# Patient Record
Sex: Male | Born: 1940 | Race: White | Hispanic: No | Smoking: Former smoker
Health system: Southern US, Community
[De-identification: ages and names within clinical notes are randomized; demographics above are authoritative.]

## PROBLEM LIST (undated history)

## (undated) DIAGNOSIS — G834 Cauda equina syndrome: Secondary | ICD-10-CM

## (undated) DIAGNOSIS — C4499 Other specified malignant neoplasm of skin, unspecified: Secondary | ICD-10-CM

## (undated) DIAGNOSIS — I10 Essential (primary) hypertension: Secondary | ICD-10-CM

## (undated) DIAGNOSIS — K219 Gastro-esophageal reflux disease without esophagitis: Secondary | ICD-10-CM

## (undated) DIAGNOSIS — E11319 Type 2 diabetes mellitus with unspecified diabetic retinopathy without macular edema: Secondary | ICD-10-CM

## (undated) DIAGNOSIS — C4490 Unspecified malignant neoplasm of skin, unspecified: Secondary | ICD-10-CM

## (undated) HISTORY — DX: Type 2 diabetes mellitus with unspecified diabetic retinopathy without macular edema: E11.319

## (undated) HISTORY — DX: Other specified malignant neoplasm of skin, unspecified: C44.99

## (undated) HISTORY — DX: Essential (primary) hypertension: I10

## (undated) HISTORY — DX: Cauda equina syndrome: G83.4

## (undated) HISTORY — DX: Gastro-esophageal reflux disease without esophagitis: K21.9

## (undated) HISTORY — DX: Unspecified malignant neoplasm of skin, unspecified: C44.90

## (undated) HISTORY — PX: KNEE SURGERY: SHX244

---

## 1999-02-10 ENCOUNTER — Encounter: Admission: RE | Admit: 1999-02-10 | Discharge: 1999-05-11 | Payer: Self-pay | Admitting: Internal Medicine

## 1999-07-27 ENCOUNTER — Encounter: Admission: RE | Admit: 1999-07-27 | Discharge: 1999-10-25 | Payer: Self-pay | Admitting: Orthopedic Surgery

## 2000-09-20 ENCOUNTER — Encounter: Admission: RE | Admit: 2000-09-20 | Discharge: 2000-09-26 | Payer: Self-pay | Admitting: Orthopedic Surgery

## 2000-12-20 ENCOUNTER — Encounter: Admission: RE | Admit: 2000-12-20 | Discharge: 2000-12-26 | Payer: Self-pay | Admitting: Orthopedic Surgery

## 2001-03-28 ENCOUNTER — Encounter: Admission: RE | Admit: 2001-03-28 | Discharge: 2001-06-20 | Payer: Self-pay | Admitting: Orthopedic Surgery

## 2001-12-25 ENCOUNTER — Encounter (HOSPITAL_BASED_OUTPATIENT_CLINIC_OR_DEPARTMENT_OTHER): Admission: RE | Admit: 2001-12-25 | Discharge: 2002-03-25 | Payer: Self-pay | Admitting: Orthopedic Surgery

## 2003-06-22 HISTORY — PX: CORONARY ARTERY BYPASS GRAFT: SHX141

## 2003-06-22 HISTORY — PX: CARDIAC CATHETERIZATION: SHX172

## 2003-09-05 ENCOUNTER — Encounter (HOSPITAL_BASED_OUTPATIENT_CLINIC_OR_DEPARTMENT_OTHER): Admission: RE | Admit: 2003-09-05 | Discharge: 2003-09-12 | Payer: Self-pay | Admitting: Internal Medicine

## 2004-03-27 ENCOUNTER — Inpatient Hospital Stay (HOSPITAL_COMMUNITY): Admission: RE | Admit: 2004-03-27 | Discharge: 2004-04-02 | Payer: Self-pay | Admitting: Surgery

## 2004-04-21 ENCOUNTER — Encounter: Admission: RE | Admit: 2004-04-21 | Discharge: 2004-04-21 | Payer: Self-pay | Admitting: Surgery

## 2004-07-31 ENCOUNTER — Encounter (HOSPITAL_BASED_OUTPATIENT_CLINIC_OR_DEPARTMENT_OTHER): Admission: RE | Admit: 2004-07-31 | Discharge: 2004-09-18 | Payer: Self-pay | Admitting: Internal Medicine

## 2005-03-10 ENCOUNTER — Encounter: Admission: RE | Admit: 2005-03-10 | Discharge: 2005-03-10 | Payer: Self-pay | Admitting: Family Medicine

## 2005-11-10 ENCOUNTER — Emergency Department: Payer: Self-pay | Admitting: Emergency Medicine

## 2005-11-12 ENCOUNTER — Ambulatory Visit: Payer: Self-pay | Admitting: Unknown Physician Specialty

## 2005-11-12 ENCOUNTER — Other Ambulatory Visit: Payer: Self-pay

## 2005-11-17 ENCOUNTER — Inpatient Hospital Stay: Payer: Self-pay | Admitting: Unknown Physician Specialty

## 2006-02-24 ENCOUNTER — Ambulatory Visit: Payer: Self-pay | Admitting: Family Medicine

## 2007-02-22 ENCOUNTER — Ambulatory Visit: Payer: Self-pay | Admitting: Family Medicine

## 2007-06-08 ENCOUNTER — Ambulatory Visit: Payer: Self-pay | Admitting: Family Medicine

## 2009-08-14 ENCOUNTER — Ambulatory Visit: Payer: Self-pay | Admitting: Family Medicine

## 2009-11-19 ENCOUNTER — Ambulatory Visit: Payer: Self-pay | Admitting: Family Medicine

## 2010-11-06 NOTE — Op Note (Signed)
[name], [name]                ACCOUNT NO.:  [id number]   MEDICAL RECORD NO.:  [id number]          PATIENT TYPE:  INP   LOCATION:  2303                         FACILITY:  [facility]   PHYSICIAN:  Gilford Raid, M.D.     DATE OF BIRTH:  1941/06/10   DATE OF PROCEDURE:  03/27/2004  DATE OF DISCHARGE:                                 OPERATIVE REPORT   PREOPERATIVE DIAGNOSIS:  Severe 3-vessel coronary artery disease with  unstable angina.   POSTOPERATIVE DIAGNOSIS:  Severe 3-vessel coronary artery disease with  unstable angina.   OPERATION:  Median sternotomy, extracorporeal circulation, coronary artery  bypass graft surgery x7 using a sequential left internal mammary artery  graft to the second diagonal branch of the left anterior descending artery  and the distal left anterior descending artery itself, a saphenous vein  graft to the first diagonal branch of the left anterior descending artery,  sequential  saphenous vein graft to intermediate, posterolateral 1 and posterolateral 2  branches of the left circumflex coronary artery.  Saphenous vein graft to  the posterior descending coronary artery.  Endoscopic vein harvesting from  the left leg.   SURGEON:  Gilford Raid, M.D.   ASSISTANT:  Ivin Poot, M.D.   SECOND ASSISTANT:  Suzzanne Cloud, P.A.-C.   ANESTHESIA:  General endotracheal.   CLINICAL HISTORY:  This patient is a 70 year old patient of Dr. Krista Blue.  Patient has history of hypertension, hyperlipidemia, and diabetes.  He has a  several months history of fatigue and chest heaviness with exertion and  recently developed unstable anginal symptoms which had been occurring at  rest. A recent Cardiolite scan showed inferior apical ischemia with a very  small defect.  He was put on medical therapy but continued to have chest  pain.   Cardiac catheterization showed a heavily calcified left main and proximal  LAD and left circumflex. There was 90% proximal LAD stenosis.  There was a  moderate sized first diagonal and 90% ostial stenosis.  The LAD also had 70-  80% stenosis in the mid-portion beyond the second diagonal branch.  The  second diagonal itself was a medium sized vessel. The left circumflex was a  dominant vessel that had several branches which were relatively small.  There was a moderate sized intermediate vessel that had 90% proximal  stenosis. The left circumflex itself had 90% proximal stenosis. There was  also 90% mid-left circumflex stenosis and about 40% distal stenosis,  followed by subtotal occlusion before the terminal posterolateral branch.  The right coronary artery was occluded proximally with filling of the  posterior descending branch by collaterals from the left.  Left ventricular  ejection fraction was 50% with no mitral regurgitation.   After review of the angiogram and examination of the patient, it was felt  that coronary artery bypass graft surgery was the best treatment.  I  discussed the operative procedure with the patient and his wife, including  alternatives, benefits and risks including bleeding, blood transfusion,  infection, stroke, myocardial infarction, graft failure and death.  They  understood and agreed to proceed.  DESCRIPTION OF PROCEDURE:  The patient was taken to the operating room,  placed on the table in the supine position.  After induction of general  endotracheal anesthesia, a  Foley catheter was placed in the bladder using  sterile technique. Then the chest, abdomen and both lower extremities were  prepped and draped in the usual sterile manner.   The chest was entered through a median sternotomy incision and the  pericardium opened in the midline.  Examination of the heart showed good  ventricular contractility.  The ascending aorta had no palpable plaque in  it.   Then the left internal mammary artery was harvested from the chest wall as a  pedicle graft. This was a medium caliber vessel with  excellent blood flow  through it. At the same time, a segment of greater saphenous vein was  harvested from the left leg using endoscopic vein harvest technique. This  vein was of medium size and good quality.   I initially examined the right greater saphenous vein at the knee, but this  was a small vein and therefore was not harvested.   The patient was then heparinized and after an adequate clotting time was  achieved, the distal ascending aorta was cannulated using a 20 French aortic  cannula for arterial inflow.  Venous outflow was achieved using a 2-stage  venous cannula through the right atrial appendage.  An antegrade  cardioplegia and vent cannula was inserted in the aortic root.   The patient was placed on cardiopulmonary bypass and the distal coronary was  identified.  The LAD was a medium sized graftable vessel.  It was diffusely diseased. The first diagonal was small but graftable. The  second diagonal was a moderate sized vessel.  The intermediate artery was  intra-myocardial that was located in its mid-portion and it was a  moderate sized, graftable vessel.  There were several other obtuse marginal  or posterolateral branches, which were small vessels.  The 2 posterolateral  branches at the end of the left circumflex were small but felt to be  graftable.  The posterior descending was a moderate sized, graftable vessel.   The aorta was then cross-clamped and 500 cc of cold blood antegrade  cardioplegia was administered in the aortic root with quick arrest of the  heart.  Systemic hypothermia to 20 degrees Centigrade and topical  hypothermia with iced saline was used.  A temperature probe was placed in  the septum, insulating pad in the pericardium.   The first distal anastomosis was performed to the intermediate coronary  artery. The internal diameter was about 1.6 mm. The conduit used was a segment of greater saphenous vein.  The anastomosis was performed in an end-   to-side manner using 7-0 Prolene suture.  Flow was noted through the graft  and was excellent.   The second distal anastomosis was performed to the first posterolateral  branch. The internal diameter of this vessel was about 1.4 mm.  Conduit used  was the same segment of greater saphenous vein. The anastomosis was  performed in a sequential, side-to-side manner using continuous 8-0 Prolene  suture.  Flow was noted through the graft and was excellent.   The third distal anastomosis was performed to the second posterolateral  branch. The internal diameter of this vessel was about 1.4 mm.  Conduit used  was the same segment of greater saphenous vein. The anastomosis was  performed in a sequential, end-to-side manner using continuous 8-0 Prolene  suture.  Flow  was again measured through this graft and was excellent.  Then  another dose of cardioplegia was given down the vein graft, and in the  aortic root.   The fourth distal anastomosis was performed to the first diagonal branch.  The internal diameter was 1.5 mm. Conduit used was a second segment of  greater saphenous vein.  The anastomosis was performed in an  end-to-side manner using continuous 7-0 Prolene suture.  Flow was noted  through this graft and was excellent.   The fifth distal anastomosis was performed to the posterior descending  coronary artery.  The internal diameter of this vessel was 1.6 mm. Conduit  used was a third segment of greater saphenous vein.  The anastomosis was  performed in an end-to-side manner using continuous 7-0 Prolene suture.  Flow was noted through this graft and was excellent.   Then another dose of cardioplegia was given down the vein grafts and in the  aortic root.   The third distal anastomosis was performed to the second posterolateral  branch. The internal diameter of this vessel was about 1.4 mm.  Conduit used  was the same segment of greater saphenous vein. The anastomosis was  performed in  a sequential, end-to-side manner using continuous 8-0 Prolene  suture.  Flow was again measured through this graft and was excellent.  Then  another dose of cardioplegia given down the vein graft, and in the aortic  root.   The fourth distal anastomosis was performed to the first diagonal branch.  The internal diameter was 1.5 mm. Conduit used was a second segment of  greater saphenous vein.  The anastomosis was performed in an  end-to-side manner using continuous 7-0 Prolene suture.  Flow was noted  through this graft and was excellent.   The sixth distal anastomosis was performed to the second diagonal branch.  The internal diameter was about 1.6 mm. Conduit used was  the left internal mammary artery graft.  This was brought through an opening  in the left pericardium, anterior to the phrenic nerve. It was anastomosed  to the second diagonal branch in a sequential, side-to-side manner using continuous 8-0 Prolene suture.  Flow was noted through this graft and was  excellent.   The seventh distal anastomosis was performed to the distal LAD.  The  internal diameter was about 1.6 mm. Conduit used was the left internal  mammary graft.  It was anastomosed in a sequential, side-to-side manner  using continuous 8-0 Prolene suture.  Flow was again measured through this  graft and was excellent.   The mammary pedicle was tacked to the epicardium with 6-0 Prolene sutures at  both anastomoses.  The patient was then rewarmed to 37 degrees Centigrade  and the clamp removed from the mammary pedicle.  There was rapid warming of  the ventricular septum and return of  spontaneous ventricular fibrillation.  The cross-clamp was removed at a time of 106 minutes.  The patient  spontaneously converted to sinus rhythm.   A partial occlusion clamp was placed in the aortic root and the 3 proximal  vein graft anastomoses were performed in an end-to-side manner using 0 and 6-  0 Prolene suture.  The clamp  was removed, the vein grafts deaired, and  clamped.  The proximal and distal anastomoses appeared hemostatic and lie of  the grafts satisfactory. Graft markers were placed around the proximal  anastomoses.   Two temporary right ventricular and right atrial pacing wires were placed  and brought  out through the skin.   When the patient had rewarmed to 37 degrees Centigrade, he was weaned from  cardiopulmonary bypass on no inotropic agents.  Total bypass time was 148  minutes.  Cardiac function appeared excellent with cardiac output of 5.6  L/minute. Protamine was given and the venous and aortic cannulas were  removed without difficulty.  Hemostasis was achieved. Three chest tubes were  placed; 1 tube in the posterior pericardium and 1 in the left pleural space  and 1 in the anterior mediastinum. The pericardium was loosely  reapproximated over the heart.  The sternum was closed with #6 stainless  steel wires.  The fascia was closed with continuous #1 Vicryl suture,  subcutaneous tissue was closed with continuous 2-0 Vicryl and skin with 3-0  Vicryl subcuticular closure.  The lower extremity vein harvest site was then  closed in layers in a similar manner.   Then the 2 On-Q pain control catheters were inserted. Stab incisions were  made just lateral to the chest tube sites, and the 12 cm introducer and  trocar was inserted and advanced anterior to the chest wall on both sides of  the sternum.  Through the introducer, the infusion catheter was inserted,  and the introducer was removed on each side. The catheters were fixed to the  chest with a silk suture.  These catheters were then flushed with 5 cc of  0.5% Marcaine local anesthesia and then were connected to the infusion bulb,  and infusion was begun.  The patient tolerated the procedure well, was then  transported to the SICU in guarded but stable condition.  The sponge, instrument and needle counts were correct according to the scrub  nurse.      Brya   BB/MEDQ  D:  03/27/2004  T:  03/27/2004  Job:  [id number]   cc:   Eden Lathe. Einar Gip, [place], [address] [address]  Fax: [phone]   CVTS office   Cardiac cath lab, [facility]

## 2010-11-06 NOTE — Discharge Summary (Signed)
[name], [name]                ACCOUNT NO.:  [id number]   MEDICAL RECORD NO.:  [id number]          PATIENT TYPE:  INP   LOCATION:  2014                         FACILITY:  [facility]   PHYSICIAN:  [name], M.D.     DATE OF BIRTH:  25-Feb-1941   DATE OF ADMISSION:  03/27/2004  DATE OF DISCHARGE:  04/02/2004                                 DISCHARGE SUMMARY   HISTORY OF PRESENT ILLNESS:  This is a 70 year old patient referred to Dr.  Cyndia Collins by Dr. Einar Collins.  He has a known history of hypertension,  hyperlipidemia, and diabetes.  He has had several months of fatigue and  chest heaviness with exertion and recently developed unstable angina  symptoms which were occurring at rest.  Cardiolite study revealed inferior  apical ischemia with a very small defect.  He was placed on medical therapy,  but continued to have symptoms of chest pain.  He subsequently was taken to  the cardiac catheterization lab where he was found to have heavily calcified  left main and proximal LAD and left circumflex disease.  There was 90%  proximal LAD stenosis.  There was a moderate size first diagonal and 90%  ostial stenosis.  The LAD also had a 70-80% stenosis in the mid portion  beyond the second diagonal branch.  The second diagonal itself was a medium  size vessel.  The left circumflex was a dominant vessel that had several  branches which were relatively small.  There was a moderate size  intermediate vessel that had 90%  proximal stenosis.  The left circumflex  itself had a 90% proximal stenosis.  There was also a 90% mid left  circumflex stenosis and about a 40% distal stenosis, followed by a subtotal  occlusion before the terminal posterior lateral branch.  The right coronary  artery was occluded proximally with filling of the posterior descending  branch by collaterals from the left.  Left ventricular ejection fraction was  50% with no mitral regurgitation.  He was felt to be a candidate for  surgical  revascularization and was admitted this hospitalization for the  procedure.   ALLERGIES:  ALTACE causes a cough, but no known drug allergies.   MEDICATIONS PRIOR TO ADMISSION:  1.  Glucophage 500 mg b.i.d.  2.  Glyburide 5 mg b.i.d.  3.  Zocor, dose unknown.  4.  Aspirin p.r.n.  5.  Diovan 80 mg q.h.s.  6.  Toprol 25 mg daily.  7.  Plavix 75 mg daily.  8.  Sublingual nitroglycerin p.r.n.   PAST MEDICAL HISTORY:  1.  Unstable angina pectoris with severe coronary artery disease as      described.  2.  Diabetes mellitus.  3.  Hypertension.  4.  Dyslipidemia.   FAMILY HISTORY, SOCIAL HISTORY, REVIEW OF SYMPTOMS, PHYSICAL EXAM:  Please  see the history and physical done at the time of admission.   HOSPITAL COURSE:  The patient was admitted electively and taken to the  operating room on March 27, 2004 where he underwent the following  procedure.  Coronary bypass grafting x7 using a sequential  left internal  mammary artery to the second diagonal branch and the left anterior  descending and the distal left anterior descending artery itself, a  saphenous vein graft to the first diagonal branch of the left anterior  descending artery, sequential saphenous vein graft to the intermediate,  posterior lateral 1 and posterior lateral 2 branches of the left circumflex  coronary artery.  Saphenous vein graft to the posterior descending coronary  artery.  Intraoperative findings including small distal vessels and diffuse  disease.  He did tolerate the procedure quite well and was taken to surgical  intensive care unit in stable condition.   POSTOPERATIVE HOSPITAL COURSE:  The patient has done well.  He was extubated  from the ventilator without difficulty.  All routine lines, monitors, and  drainage devices were discontinued in a standard fashion.  The patient did  require an aggressive diuresis.  He is responding well to Lasix and will  continue as an outpatient for a short time.  His  diabetes was somewhat  difficult to control early, but has improved.  His oral regimen is now  handling his glycemic control, but he may require further treatment as an  outpatient.  His incisions are all healing well without evidence of  infection.  He is maintaining normal sinus rhythm.  Oxygen has been weaned  and he maintains good saturations on room air.  Laboratory values are within  normal limits with the exception of a mild anemia.  His most recent  hemoglobin and hematocrit dated March 31, 2004 are 10.1 and 28  respectively.  He did have some difficulty with nausea, but this was mostly  felt to be related to his pain medication and has improved.  He is  tolerating diet adequately.  He is tolerating routine activities  commensurate for level of postoperative convalescence using cardiac  rehabilitation phase one modalities.  Overall, he is felt to be stable on  today's date of April 02, 2004.   DISCHARGE MEDICATIONS:  1.  Aspirin 325 mg daily.  2.  Toprol XL 25 mg daily.  3.  Avapro 75 mg daily.  4.  Zocor 40 mg daily.  5.  Glyburide 5 mg twice daily.  6.  Plavix 75 mg daily.  7.  Glucophage 1000 mg two times daily.  8.  He is to resume his home Prevacid.  9.  Lasix 40 mg daily for five days.  10. K-Dur 20 mEq daily for five days.  11. For pain, Tylox one or two every four to six hours as needed for pain.   INSTRUCTIONS:  The patient received written instructions in regard to  medications, activity, diet, wound care and followup.  Followup will include  Dr. Einar Collins two weeks, Dr. Cyndia Collins in three weeks on April 21, 2004 with  chest x-ray from [facility].   FINAL DIAGNOSIS:  Severe multivessel coronary artery disease as described.   OTHER DIAGNOSES:  1.  Hypertension.  2.  Hypercholesterolemia.  3.  Diabetes mellitus type 2.  4.  Dyslipidemia.  5.  Postoperative anemia.  ADDENDUM:  The patient is instructed to follow up with Dr. Redmond Collins in   regards to his diabetes who he sees from a medical view point.      [name]   WEG/MEDQ  D:  04/02/2004  T:  04/02/2004  Job:  [id number]   cc:   [name]. [name], [place] [address], [address] [place]  [place] [address]  Fax: [phone]

## 2012-02-17 ENCOUNTER — Telehealth: Payer: Self-pay | Admitting: Family Medicine

## 2012-02-17 NOTE — Telephone Encounter (Signed)
LM

## 2012-06-21 HISTORY — PX: CATARACT EXTRACTION: SUR2

## 2012-07-11 ENCOUNTER — Ambulatory Visit: Payer: Self-pay | Admitting: Oncology

## 2012-07-22 ENCOUNTER — Ambulatory Visit: Payer: Self-pay | Admitting: Oncology

## 2012-07-22 HISTORY — PX: BACK SURGERY: SHX140

## 2012-10-19 ENCOUNTER — Ambulatory Visit: Payer: Self-pay | Admitting: Oncology

## 2012-11-06 LAB — BASIC METABOLIC PANEL
Anion Gap: 11 (ref 7–16)
BUN: 29 mg/dL — ABNORMAL HIGH (ref 7–18)
Calcium, Total: 9 mg/dL (ref 8.5–10.1)
Chloride: 100 mmol/L (ref 98–107)
Co2: 26 mmol/L (ref 21–32)
Creatinine: 1.59 mg/dL — ABNORMAL HIGH (ref 0.60–1.30)
EGFR (African American): 49 — ABNORMAL LOW
EGFR (Non-African Amer.): 42 — ABNORMAL LOW
Glucose: 170 mg/dL — ABNORMAL HIGH (ref 65–99)
Osmolality: 284 (ref 275–301)
Potassium: 4.9 mmol/L (ref 3.5–5.1)
Sodium: 137 mmol/L (ref 136–145)

## 2012-11-06 LAB — CBC CANCER CENTER
Basophil #: 0.1 x10 3/mm (ref 0.0–0.1)
Basophil %: 0.9 %
Eosinophil #: 0.3 x10 3/mm (ref 0.0–0.7)
Eosinophil %: 4 %
HCT: 37.3 % — ABNORMAL LOW (ref 40.0–52.0)
HGB: 12.9 g/dL — ABNORMAL LOW (ref 13.0–18.0)
Lymphocyte #: 2 x10 3/mm (ref 1.0–3.6)
Lymphocyte %: 27.7 %
MCH: 28.5 pg (ref 26.0–34.0)
MCHC: 34.7 g/dL (ref 32.0–36.0)
MCV: 82 fL (ref 80–100)
Monocyte #: 0.6 x10 3/mm (ref 0.2–1.0)
Monocyte %: 8.2 %
Neutrophil #: 4.4 x10 3/mm (ref 1.4–6.5)
Neutrophil %: 59.2 %
Platelet: 191 x10 3/mm (ref 150–440)
RBC: 4.54 10*6/uL (ref 4.40–5.90)
RDW: 13.6 % (ref 11.5–14.5)
WBC: 7.4 x10 3/mm (ref 3.8–10.6)

## 2012-11-07 LAB — PROT IMMUNOELECTROPHORES(ARMC)

## 2012-11-07 LAB — KAPPA/LAMBDA FREE LIGHT CHAINS (ARMC)

## 2012-11-07 LAB — FREE K+L LT CHAIN, QT, UR (24 HOUR)

## 2012-11-08 LAB — URINE IEP, RANDOM

## 2012-11-19 ENCOUNTER — Ambulatory Visit: Payer: Self-pay | Admitting: Oncology

## 2013-03-22 ENCOUNTER — Ambulatory Visit: Payer: Self-pay | Admitting: Ophthalmology

## 2013-04-03 ENCOUNTER — Ambulatory Visit: Payer: Self-pay | Admitting: Ophthalmology

## 2013-04-06 ENCOUNTER — Ambulatory Visit (INDEPENDENT_AMBULATORY_CARE_PROVIDER_SITE_OTHER): Payer: Medicare Other | Admitting: Cardiovascular Disease

## 2013-04-06 ENCOUNTER — Encounter: Payer: Self-pay | Admitting: Cardiovascular Disease

## 2013-04-06 ENCOUNTER — Encounter (INDEPENDENT_AMBULATORY_CARE_PROVIDER_SITE_OTHER): Payer: Self-pay

## 2013-04-06 VITALS — BP 140/62 | HR 49 | Ht 74.5 in | Wt 201.5 lb

## 2013-04-06 DIAGNOSIS — R9431 Abnormal electrocardiogram [ECG] [EKG]: Secondary | ICD-10-CM

## 2013-04-06 DIAGNOSIS — R001 Bradycardia, unspecified: Secondary | ICD-10-CM | POA: Insufficient documentation

## 2013-04-06 DIAGNOSIS — I10 Essential (primary) hypertension: Secondary | ICD-10-CM

## 2013-04-06 DIAGNOSIS — I2510 Atherosclerotic heart disease of native coronary artery without angina pectoris: Secondary | ICD-10-CM

## 2013-04-06 DIAGNOSIS — I498 Other specified cardiac arrhythmias: Secondary | ICD-10-CM

## 2013-04-06 NOTE — Assessment & Plan Note (Signed)
He has no symptoms suggestive of angina with good functional capacity. He can proceed with cataract surgery after cardiac workup. No indication for a stress test. Continue medical therapy.

## 2013-04-06 NOTE — Assessment & Plan Note (Addendum)
The patient has first degree AV block with bradycardia. Overall he appears to be asymptomatic. I recommend checking thyroid function if not already done. I will obtain a 48-hour Holter monitor for evaluation. I suspect that he will require a permanent pacemaker in the future but currently there is no indication. He has mild dyspnea with no recent LV systolic evaluation. I  requested an echocardiogram.

## 2013-04-06 NOTE — Assessment & Plan Note (Signed)
Blood pressure is controlled on current medications. I recommend avoiding AV nodal blocking agents given his bradycardia first degree AV block.

## 2013-04-06 NOTE — Progress Notes (Signed)
Primary care physician: Dr. Luan Pulling  HPI  This is a pleasant 72 year old man who was referred by Dr. Luan Pulling for evaluation of bradycardia and abnormal EKG. He has known history of coronary artery disease status post 7 vessel CABG in 2005 by Dr. [name]. No cardiac events since then. He has known history of hypertension, hyperlipidemia, type 2 diabetes and GERD. He also has mild chronic kidney disease with proteinuria. 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 He needs to have cataract surgery done. Preoperative EKG was read as junctional rhythm. However, P waves with  Clear. The EKG is consistent with sinus rhythm with first degree AV block. Heart rate was 49 beats per minute. The patient denies any chest pain, dizziness, syncope or presyncope. He has mild exertional dyspnea with no recent worsening. He stays active. He plays golf on a regular basis.  Allergies  Allergen Reactions  . Ivp Dye [Iodinated Diagnostic Agents]      No current outpatient prescriptions on file prior to visit.   No current facility-administered medications on file prior to visit.     Past Medical History  Diagnosis Date  . Carcinoma of skin   . Diabetic retinopathy   . Diabetes mellitus without complication   . GERD (gastroesophageal reflux disease)   . Coronary artery disease 2005    CABG X 7 in 2005     Past Surgical History  Procedure Laterality Date  . Cardiac catheterization  2005  . Coronary artery bypass graft  2005     CABG x 7 at [facility]   . Cataract extraction  2014    right eye   . Knee surgery      left knee     Family History  Problem Relation Age of Onset  . Hypertension Sister   . Hyperlipidemia Sister      History   Social History  . Marital Status: Married    Spouse Name: N/A    Number of Children: N/A  . Years of Education: N/A   Occupational History  . Not on file.   Social History Main Topics  . Smoking status: Current Some Day Smoker -- 45 years    Types:  Cigars, Pipe  . Smokeless tobacco: Not on file  . Alcohol Use: Yes     Comment: occasional.  . Drug Use: No  . Sexual Activity: Not on file   Other Topics Concern  . Not on file   Social History Narrative  . No narrative on file     ROS A 10 point review of system was performed. It is negative other than that mentioned in the history of present illness.   PHYSICAL EXAM   BP 140/62  Pulse 49  Ht 6' 2.5" (1.892 m)  Wt 201 lb 8 oz (91.4 kg)  BMI 25.53 kg/m2 Constitutional: He is oriented to person, place, and time. He appears well-developed and well-nourished. No distress.  HENT: No nasal discharge.  Head: Normocephalic and atraumatic.  Eyes: Pupils are equal and round.  No discharge. Neck: Normal range of motion. Neck supple. No JVD present. No thyromegaly present.  Cardiovascular: Bradycardic, regular rhythm, normal heart sounds. Exam reveals no gallop and no friction rub. No murmur heard.  Pulmonary/Chest: Effort normal and breath sounds normal. No stridor. No respiratory distress. He has no wheezes. He has no rales. He exhibits no tenderness.  Abdominal: Soft. Bowel sounds are normal. He exhibits no distension. There is no tenderness. There is no rebound and no guarding.  Musculoskeletal: Normal range of motion. He exhibits no edema and no tenderness.  Neurological: He is alert and oriented to person, place, and time. Coordination normal.  Skin: Skin is warm and dry. No rash noted. He is not diaphoretic. No erythema. No pallor.  Psychiatric: He has a normal mood and affect. His behavior is normal. Judgment and thought content normal.       [id number] sinus  Bradycardia  -First degree A-V block  PRi = 288 BORDERLINE RHYTHM   ASSESSMENT AND PLAN

## 2013-04-06 NOTE — Patient Instructions (Signed)
Your physician has requested that you have an echocardiogram. Echocardiography is a painless test that uses sound waves to create images of your heart. It provides your doctor with information about the size and shape of your heart and how well your heart's chambers and valves are working. This procedure takes approximately one hour. There are no restrictions for this procedure.  Your physician has recommended that you wear a holter monitor. Holter monitors are medical devices that record the heart's electrical activity. Doctors most often use these monitors to diagnose arrhythmias. Arrhythmias are problems with the speed or rhythm of the heartbeat. The monitor is a small, portable device. You can wear one while you do your normal daily activities. This is usually used to diagnose what is causing palpitations/syncope (passing out).  Your physician wants you to follow-up in: 6 months.  You will receive a reminder letter in the mail two months in advance. If you don't receive a letter, please call our office to schedule the follow-up appointment.

## 2013-04-17 ENCOUNTER — Other Ambulatory Visit: Payer: Medicare Other

## 2013-08-15 ENCOUNTER — Emergency Department: Payer: Self-pay | Admitting: Emergency Medicine

## 2013-08-15 LAB — COMPREHENSIVE METABOLIC PANEL
Albumin: 3.6 g/dL (ref 3.4–5.0)
Alkaline Phosphatase: 57 U/L
Anion Gap: 5 — ABNORMAL LOW (ref 7–16)
BUN: 34 mg/dL — ABNORMAL HIGH (ref 7–18)
Bilirubin,Total: 0.3 mg/dL (ref 0.2–1.0)
Calcium, Total: 8.7 mg/dL (ref 8.5–10.1)
Chloride: 100 mmol/L (ref 98–107)
Co2: 25 mmol/L (ref 21–32)
Creatinine: 1.7 mg/dL — ABNORMAL HIGH (ref 0.60–1.30)
EGFR (African American): 45 — ABNORMAL LOW
EGFR (Non-African Amer.): 39 — ABNORMAL LOW
Glucose: 215 mg/dL — ABNORMAL HIGH (ref 65–99)
Osmolality: 275 (ref 275–301)
Potassium: 4.7 mmol/L (ref 3.5–5.1)
SGOT(AST): 23 U/L (ref 15–37)
SGPT (ALT): 17 U/L (ref 12–78)
Sodium: 130 mmol/L — ABNORMAL LOW (ref 136–145)
Total Protein: 7.1 g/dL (ref 6.4–8.2)

## 2013-08-15 LAB — CBC
HCT: 37.6 % — ABNORMAL LOW (ref 40.0–52.0)
HGB: 13 g/dL (ref 13.0–18.0)
MCH: 29.3 pg (ref 26.0–34.0)
MCHC: 34.6 g/dL (ref 32.0–36.0)
MCV: 85 fL (ref 80–100)
Platelet: 171 10*3/uL (ref 150–440)
RBC: 4.44 10*6/uL (ref 4.40–5.90)
RDW: 13.2 % (ref 11.5–14.5)
WBC: 6.7 10*3/uL (ref 3.8–10.6)

## 2013-08-15 LAB — TROPONIN I: Troponin-I: <0.02 ng/mL

## 2013-08-23 DIAGNOSIS — G834 Cauda equina syndrome: Secondary | ICD-10-CM | POA: Insufficient documentation

## 2014-10-11 NOTE — Op Note (Signed)
PATIENT NAME:  [name], [name] MR#:  [id number] DATE OF BIRTH:  06-11-1941  DATE OF PROCEDURE:  04/03/2013  PREOPERATIVE DIAGNOSIS: Visually significant cataract of the right eye.   POSTOPERATIVE DIAGNOSIS: Visually significant cataract of the right eye.   OPERATIVE PROCEDURE: Cataract extraction by phacoemulsification with implant of intraocular lens to the right eye.   SURGEON: Birder Robson, MD.   ANESTHESIA:  1. Managed anesthesia care.  2. Topical tetracaine drops followed by 2% Xylocaine jelly applied in the preoperative holding area.   COMPLICATIONS: None.   TECHNIQUE:  Stop and chop.   DESCRIPTION OF PROCEDURE: The patient was examined and consented in the preoperative holding area where the aforementioned topical anesthesia was applied to the right eye and then brought back to the Operating Room where the right eye was prepped and draped in the usual sterile ophthalmic fashion and a lid speculum was placed. A paracentesis was created with the side port blade and the anterior chamber was filled with viscoelastic. A near clear corneal incision was performed with the steel keratome. A continuous curvilinear capsulorrhexis was performed with a cystotome followed by the capsulorrhexis forceps. Hydrodissection and hydrodelineation were carried out with BSS on a blunt cannula. The lens was removed in a stop and chop technique and the remaining cortical material was removed with the irrigation-aspiration handpiece. The capsular bag was inflated with viscoelastic and the Tecnis ZCB00 21.0-diopter lens, serial number [id number] was placed in the capsular bag without complication. The remaining viscoelastic was removed from the eye with the irrigation-aspiration handpiece. The wounds were hydrated. The anterior chamber was flushed with Miostat and the eye was inflated to physiologic pressure. 0.1 mL of cefuroxime concentration 10 mg/mL was placed in the anterior chamber. The wounds were found to be  water tight. The eye was dressed with Vigamox. The patient was given protective glasses to wear throughout the day and a shield with which to sleep tonight. The patient was also given drops with which to begin a drop regimen today and will follow-up with me in one day.   ____________________________ Livingston Diones. Marquitta Persichetti, MD wlp:gb D: 04/03/2013 16:56:52 ET T: 04/03/2013 18:10:47 ET JOB#: [id number]  cc: Elex Mainwaring L. Oaklynn Stierwalt, MD, <Dictator> Livingston Diones Eleshia Wooley MD ELECTRONICALLY SIGNED 04/09/2013 13:18

## 2015-01-23 ENCOUNTER — Telehealth: Payer: Self-pay | Admitting: Family Medicine

## 2015-01-23 NOTE — Telephone Encounter (Signed)
Done/JH

## 2015-01-23 NOTE — Telephone Encounter (Signed)
Lilia Pro from  [facility] called need a referral  Pt  Appt. Monday the 8th, Dr. Cleda Mccreedy , [place]  [phone] -- to get pt nail cut. Lilia Pro call bacl # is  [phone]

## 2015-01-24 NOTE — Telephone Encounter (Signed)
Done [facility]

## 2015-02-11 ENCOUNTER — Encounter: Payer: Self-pay | Admitting: Family Medicine

## 2015-02-11 ENCOUNTER — Ambulatory Visit (INDEPENDENT_AMBULATORY_CARE_PROVIDER_SITE_OTHER): Payer: Commercial Managed Care - HMO | Admitting: Family Medicine

## 2015-02-11 VITALS — BP 140/60 | HR 56 | Temp 98.0°F | Resp 16 | Ht 75.0 in | Wt 206.6 lb

## 2015-02-11 DIAGNOSIS — G629 Polyneuropathy, unspecified: Secondary | ICD-10-CM | POA: Diagnosis not present

## 2015-02-11 DIAGNOSIS — E1342 Other specified diabetes mellitus with diabetic polyneuropathy: Secondary | ICD-10-CM

## 2015-02-11 DIAGNOSIS — E11649 Type 2 diabetes mellitus with hypoglycemia without coma: Secondary | ICD-10-CM | POA: Insufficient documentation

## 2015-02-11 DIAGNOSIS — E1142 Type 2 diabetes mellitus with diabetic polyneuropathy: Secondary | ICD-10-CM

## 2015-02-11 DIAGNOSIS — E08339 Diabetes mellitus due to underlying condition with moderate nonproliferative diabetic retinopathy without macular edema: Secondary | ICD-10-CM

## 2015-02-11 DIAGNOSIS — E119 Type 2 diabetes mellitus without complications: Secondary | ICD-10-CM

## 2015-02-11 DIAGNOSIS — E11621 Type 2 diabetes mellitus with foot ulcer: Secondary | ICD-10-CM | POA: Insufficient documentation

## 2015-02-11 LAB — POCT GLYCOSYLATED HEMOGLOBIN (HGB A1C): Hemoglobin A1C: 9.4

## 2015-02-11 MED ORDER — INSULIN GLARGINE 100 UNIT/ML ~~LOC~~ SOLN: 54.0000 [IU] | Freq: Every day | SUBCUTANEOUS | Status: DC

## 2015-02-11 NOTE — Progress Notes (Signed)
Name: [name]   MRN: [id number]    DOB: 03-Feb-1941   Date:02/11/2015       Progress Note  Subjective  Chief Complaint  Chief Complaint  Patient presents with  . Diabetes    Last A1C  11.0 07/16/2014 - Not following BS.     HPI  Here for f/u of DM.  Admits to not following diabetic diet.  Doesn't check BSs at home very often.  Doesn't have any complaints except for vision getting worse.  Goes to [place]. Care.  Has had multiple laser procedures.  Neuropathy doing better.  Past Medical History  Diagnosis Date  . Carcinoma of skin   . Diabetic retinopathy   . Diabetes mellitus without complication   . GERD (gastroesophageal reflux disease)   . Coronary artery disease 2005    CABG X 7 in 2005  . Hypertension     Social History  Substance Use Topics  . Smoking status: Never Smoker   . Smokeless tobacco: Never Used  . Alcohol Use: 0.0 oz/week    0 Standard drinks or equivalent per week     Comment: occasional.     Current outpatient prescriptions:  .  ACCU-CHEK FASTCLIX LANCETS MISC, , Disp: , Rfl:  .  Acetaminophen 500 MG coapsule, , Disp: , Rfl:  .  amitriptyline (ELAVIL) 10 MG tablet, Take 20 mg by mouth at bedtime., Disp: , Rfl:  .  amLODipine (NORVASC) 10 MG tablet, Take 10 mg by mouth daily., Disp: , Rfl:  .  aspirin 81 MG tablet, Take 81 mg by mouth daily., Disp: , Rfl:  .  Cholecalciferol (VITAMIN D) 400 UNITS capsule, Take 400 Units by mouth 2 (two) times daily., Disp: , Rfl:  .  glipiZIDE (GLUCOTROL) 5 MG tablet, Take 10 mg by mouth 2 (two) times daily before a meal. , Disp: , Rfl:  .  insulin glargine (LANTUS) 100 UNIT/ML injection, Inject 52 Units into the skin at bedtime., Disp: , Rfl:  .  lisinopril (PRINIVIL,ZESTRIL) 20 MG tablet, Take 20 mg by mouth daily., Disp: , Rfl:  .  omeprazole (PRILOSEC) 20 MG capsule, Take by mouth., Disp: , Rfl:  .  simvastatin (ZOCOR) 40 MG tablet, Take by mouth., Disp: , Rfl:  .  insulin detemir (LEVEMIR) 100 unit/ml  SOLN, Inject into the skin daily at 10 pm., Disp: , Rfl:   Allergies  Allergen Reactions  . Codeine Other (See Comments)  . Ivp Dye [Iodinated Diagnostic Agents]     Review of Systems  Constitutional: Negative for fever, chills and weight loss.  HENT: Negative for hearing loss.   Eyes: Positive for blurred vision. Negative for double vision and pain.  Respiratory: Negative for cough, sputum production, shortness of breath and wheezing.   Cardiovascular: Negative for chest pain, palpitations, orthopnea and leg swelling.  Gastrointestinal: Negative for heartburn, nausea, vomiting, abdominal pain, diarrhea and blood in stool.  Genitourinary: Negative for dysuria, urgency and frequency.  Musculoskeletal: Positive for back pain. Negative for myalgias.  Skin: Negative for rash.  Neurological: Negative for dizziness, tingling, sensory change, focal weakness and headaches.  Psychiatric/Behavioral: Negative for depression. The patient is not nervous/anxious.       Objective  Filed Vitals:   02/11/15 1054  BP: 155/68  Pulse: 56  Temp: 98 F (36.7 C)  Resp: 16  Height: 6\' 3"  (1.905 m)  Weight: 206 lb 9.6 oz (93.713 kg)     Physical Exam  Constitutional: He is well-developed, well-nourished,  and in no distress. No distress.  HENT:  Head: Normocephalic and atraumatic.  Eyes: Conjunctivae and EOM are normal. Pupils are equal, round, and reactive to light. No scleral icterus.  Neck: Normal range of motion. Neck supple. Carotid bruit is not present. No thyromegaly present.  Cardiovascular: Normal rate, regular rhythm and intact distal pulses.  Exam reveals no gallop and no friction rub.   No murmur heard. Pulmonary/Chest: Effort normal and breath sounds normal. No respiratory distress. He has no wheezes. He has no rales.  Abdominal: Soft. Bowel sounds are normal. He exhibits no distension and no mass. There is no tenderness.  Musculoskeletal: He exhibits no edema.  Lymphadenopathy:     He has no cervical adenopathy.  Neurological:  C/o mild peripheral neuropathy bilaterally.  Vitals reviewed.        Assessment & Plan  1. Type 2 diabetes mellitus without complication  - POCT HgB A1C - 9.4 - insulin glargine (LANTUS) 100 UNIT/ML injection; Inject 0.54 mLs (54 Units total) into the skin at bedtime.  Dispense: 10 mL; Refill: 12  2. 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 Diabetic peripheral neuropathy   4.Hypertension

## 2015-02-11 NOTE — Patient Instructions (Signed)
Try to increase exercise and decrease sugars and starches in diet.

## 2015-03-18 ENCOUNTER — Ambulatory Visit (INDEPENDENT_AMBULATORY_CARE_PROVIDER_SITE_OTHER): Payer: Commercial Managed Care - HMO

## 2015-03-18 ENCOUNTER — Telehealth: Payer: Self-pay

## 2015-03-18 ENCOUNTER — Telehealth: Payer: Self-pay | Admitting: *Deleted

## 2015-03-18 VITALS — Resp 16 | Ht 75.0 in | Wt 206.0 lb

## 2015-03-18 DIAGNOSIS — Z23 Encounter for immunization: Secondary | ICD-10-CM

## 2015-03-18 DIAGNOSIS — L723 Sebaceous cyst: Secondary | ICD-10-CM | POA: Diagnosis not present

## 2015-03-18 NOTE — Telephone Encounter (Signed)
done

## 2015-03-18 NOTE — Patient Instructions (Signed)
Patient came for Flu Shot and showed Dr. Luan Pulling a Cyst and asked to be referred to [facility] Dr. Aubery Lapping. Agreed to send him as he is a patient there and needs only [facility]. He will call them to schedule. [facility]

## 2015-03-18 NOTE — Telephone Encounter (Signed)
Patient was in office for flu shot. Dr. Luan Pulling agreed to referral to dermatology. Josem Kaufmann [phone]

## 2015-03-18 NOTE — Telephone Encounter (Signed)
Please send [employer] Auth for patient to see Dr. Aubery Lapping at [place]. He will call to get the appt. DX: Subacious Cyst  Thanks [facility]

## 2015-05-20 ENCOUNTER — Ambulatory Visit (INDEPENDENT_AMBULATORY_CARE_PROVIDER_SITE_OTHER): Payer: Commercial Managed Care - HMO | Admitting: Family Medicine

## 2015-05-20 ENCOUNTER — Encounter: Payer: Self-pay | Admitting: Family Medicine

## 2015-05-20 VITALS — BP 144/65 | HR 71 | Temp 97.3°F | Resp 16 | Ht 73.0 in | Wt 210.0 lb

## 2015-05-20 DIAGNOSIS — E083399 Diabetes mellitus due to underlying condition with moderate nonproliferative diabetic retinopathy without macular edema, unspecified eye: Secondary | ICD-10-CM

## 2015-05-20 DIAGNOSIS — Z794 Long term (current) use of insulin: Secondary | ICD-10-CM

## 2015-05-20 DIAGNOSIS — I10 Essential (primary) hypertension: Secondary | ICD-10-CM | POA: Diagnosis not present

## 2015-05-20 DIAGNOSIS — I2510 Atherosclerotic heart disease of native coronary artery without angina pectoris: Secondary | ICD-10-CM | POA: Diagnosis not present

## 2015-05-20 MED ORDER — SIMVASTATIN 10 MG PO TABS: 10.0000 mg | ORAL_TABLET | Freq: Every day | ORAL | Status: DC

## 2015-05-20 MED ORDER — AMITRIPTYLINE HCL 10 MG PO TABS: 20.0000 mg | ORAL_TABLET | Freq: Every day | ORAL | Status: DC

## 2015-05-20 NOTE — Progress Notes (Signed)
Name: [name]   MRN: [id number]    DOB: 03-18-1941   Date:05/20/2015       Progress Note  Subjective  Chief Complaint  Chief Complaint  Patient presents with  . Diabetes    HPI Here for f/u of DM and HBP and renal insufficiency.   He goes to [place] for primary care also.  Now back on Lantus Pen and can better see insulin dose and is more accurate.  BSs running 200s pp 140-150 AC.    No problem-specific assessment & plan notes found for this encounter.   Past Medical History  Diagnosis Date  . Carcinoma of skin   . Diabetic retinopathy ([place])   . Diabetes mellitus without complication ([place])   . GERD (gastroesophageal reflux disease)   . Coronary artery disease 2005    CABG X 7 in 2005  . Hypertension     Past Surgical History  Procedure Laterality Date  . Cardiac catheterization  2005  . Coronary artery bypass graft  2005     CABG x 7 at [facility]   . Cataract extraction  2014    right eye   . Knee surgery      left knee    Family History  Problem Relation Age of Onset  . Hypertension Sister   . Hyperlipidemia Sister     Social History   Social History  . Marital Status: Married    Spouse Name: N/A  . Number of Children: N/A  . Years of Education: N/A   Occupational History  . Not on file.   Social History Main Topics  . Smoking status: Never Smoker   . Smokeless tobacco: Never Used  . Alcohol Use: 0.0 oz/week    0 Standard drinks or equivalent per week     Comment: occasional.  . Drug Use: No  . Sexual Activity: Not on file   Other Topics Concern  . Not on file   Social History Narrative     Current outpatient prescriptions:  .  ACCU-CHEK FASTCLIX LANCETS MISC, , Disp: , Rfl:  .  Acetaminophen 500 MG coapsule, , Disp: , Rfl:  .  amitriptyline (ELAVIL) 10 MG tablet, Take 2 tablets (20 mg total) by mouth at bedtime., Disp: 180 tablet, Rfl: 3 .  amLODipine (NORVASC) 10 MG tablet, Take 10 mg by mouth daily., Disp: , Rfl:  .  aspirin 81 MG  tablet, Take 81 mg by mouth daily., Disp: , Rfl:  .  Cholecalciferol (VITAMIN D) 400 UNITS capsule, Take 400 Units by mouth 2 (two) times daily., Disp: , Rfl:  .  glipiZIDE (GLUCOTROL) 5 MG tablet, Take 10 mg by mouth 2 (two) times daily before a meal. , Disp: , Rfl:  .  insulin glargine (LANTUS) 100 UNIT/ML injection, Inject 0.54 mLs (54 Units total) into the skin at bedtime., Disp: 10 mL, Rfl: 12 .  lisinopril (PRINIVIL,ZESTRIL) 20 MG tablet, Take 20 mg by mouth daily., Disp: , Rfl:  .  omeprazole (PRILOSEC) 20 MG capsule, Take by mouth., Disp: , Rfl:  .  simvastatin (ZOCOR) 10 MG tablet, Take 1 tablet (10 mg total) by mouth daily at 6 PM., Disp: 90 tablet, Rfl: 3  Allergies  Allergen Reactions  . Codeine Other (See Comments)  . Ivp Dye [Iodinated Diagnostic Agents]      Review of Systems  Constitutional: Negative for fever, chills, weight loss and malaise/fatigue.  Eyes: Negative for blurred vision and double vision.  Respiratory: Negative for cough,  shortness of breath and wheezing.   Cardiovascular: Negative for chest pain, palpitations and leg swelling.  Gastrointestinal: Negative for heartburn, abdominal pain and blood in stool.  Genitourinary: Negative for dysuria, urgency and frequency.  Skin: Negative for rash.  Neurological: Negative for dizziness, weakness and headaches.  Psychiatric/Behavioral: Negative for depression.      Objective  Filed Vitals:   05/20/15 1045 05/20/15 1110  BP: 162/74 144/65  Pulse: 71   Temp: 97.3 F (36.3 C)   TempSrc: Oral   Resp: 16   Height: 6\' 1"  (1.854 m)   Weight: 210 lb (95.255 kg)     Physical Exam  Constitutional: He is oriented to person, place, and time and well-developed, well-nourished, and in no distress. No distress.  HENT:  Head: Normocephalic and atraumatic.  Eyes: Conjunctivae and EOM are normal. Pupils are equal, round, and reactive to light. No scleral icterus.  Neck: Normal range of motion. Neck supple. Carotid  bruit is not present. No thyromegaly present.  Cardiovascular: Normal rate, regular rhythm and normal heart sounds.  Exam reveals no gallop and no friction rub.   No murmur heard. Pulmonary/Chest: Effort normal and breath sounds normal. No respiratory distress. He has no wheezes. He has no rales.  Abdominal: Soft. Bowel sounds are normal. He exhibits no distension, no abdominal bruit and no mass. There is no tenderness.  Musculoskeletal: He exhibits no edema.  Lymphadenopathy:    He has no cervical adenopathy.  Neurological: He is alert and oriented to person, place, and time.  Vitals reviewed.      No results found for this or any previous visit (from the past 2160 hour(s)).   Assessment & Plan  Problem List Items Addressed This Visit      Cardiovascular and Mediastinum   Coronary artery disease   Relevant Medications   simvastatin (ZOCOR) 10 MG tablet   Hypertension - Primary   Relevant Medications   simvastatin (ZOCOR) 10 MG tablet     Endocrine   Diabetes (HCC)   Relevant Medications   amitriptyline (ELAVIL) 10 MG tablet   simvastatin (ZOCOR) 10 MG tablet      Meds ordered this encounter  Medications  . amitriptyline (ELAVIL) 10 MG tablet    Sig: Take 2 tablets (20 mg total) by mouth at bedtime.    Dispense:  180 tablet    Refill:  3  . simvastatin (ZOCOR) 10 MG tablet    Sig: Take 1 tablet (10 mg total) by mouth daily at 6 PM.    Dispense:  90 tablet    Refill:  3   1. Essential hypertension   2. Coronary artery disease involving native coronary artery of native heart without angina pectoris  - simvastatin (ZOCOR) 10 MG tablet; Take 1 tablet (10 mg total) by mouth daily at 6 PM.  Dispense: 90 tablet; Refill: 3  3. Diabetes mellitus due to underlying condition with moderate nonproliferative retinopathy, with long-term current use of insulin, macular edema presence unspecified, unspecified laterality (HCC)  - amitriptyline (ELAVIL) 10 MG tablet; Take 2  tablets (20 mg total) by mouth at bedtime.  Dispense: 180 tablet; Refill: 3

## 2015-05-27 ENCOUNTER — Other Ambulatory Visit: Payer: Self-pay | Admitting: Family Medicine

## 2015-05-27 MED ORDER — GLIPIZIDE 5 MG PO TABS: 10.0000 mg | ORAL_TABLET | Freq: Two times a day (BID) | ORAL | Status: DC

## 2015-05-27 NOTE — Telephone Encounter (Signed)
Patient's spouse aware of medications.

## 2015-05-27 NOTE — Telephone Encounter (Signed)
Please call Hoyle Sauer about pt's medication list.  She thinks she is missing one of his morning medications.  Her call back number is [phone].

## 2015-05-27 NOTE — Telephone Encounter (Signed)
Patient's wife says he has not been taking Glizipide. Do you still want him taking? If so, directions?

## 2015-07-11 DIAGNOSIS — D0461 Carcinoma in situ of skin of right upper limb, including shoulder: Secondary | ICD-10-CM | POA: Diagnosis not present

## 2015-07-11 DIAGNOSIS — C44622 Squamous cell carcinoma of skin of right upper limb, including shoulder: Secondary | ICD-10-CM | POA: Diagnosis not present

## 2015-07-29 ENCOUNTER — Ambulatory Visit: Payer: Commercial Managed Care - HMO | Admitting: Family Medicine

## 2015-07-29 ENCOUNTER — Encounter: Payer: Self-pay | Admitting: *Deleted

## 2015-07-29 ENCOUNTER — Telehealth: Payer: Self-pay

## 2015-07-29 ENCOUNTER — Emergency Department: Payer: PPO

## 2015-07-29 ENCOUNTER — Observation Stay
Admission: EM | Admit: 2015-07-29 | Discharge: 2015-08-01 | Disposition: A | Discharge status: Home / Self Care | Payer: PPO | Attending: Internal Medicine | Admitting: Internal Medicine

## 2015-07-29 DIAGNOSIS — Z9181 History of falling: Secondary | ICD-10-CM | POA: Diagnosis not present

## 2015-07-29 DIAGNOSIS — N281 Cyst of kidney, acquired: Secondary | ICD-10-CM | POA: Insufficient documentation

## 2015-07-29 DIAGNOSIS — Z951 Presence of aortocoronary bypass graft: Secondary | ICD-10-CM | POA: Insufficient documentation

## 2015-07-29 DIAGNOSIS — I2510 Atherosclerotic heart disease of native coronary artery without angina pectoris: Secondary | ICD-10-CM | POA: Diagnosis not present

## 2015-07-29 DIAGNOSIS — E119 Type 2 diabetes mellitus without complications: Secondary | ICD-10-CM | POA: Diagnosis not present

## 2015-07-29 DIAGNOSIS — N476 Balanoposthitis: Secondary | ICD-10-CM | POA: Diagnosis not present

## 2015-07-29 DIAGNOSIS — N471 Phimosis: Secondary | ICD-10-CM | POA: Insufficient documentation

## 2015-07-29 DIAGNOSIS — R339 Retention of urine, unspecified: Secondary | ICD-10-CM | POA: Insufficient documentation

## 2015-07-29 DIAGNOSIS — E1122 Type 2 diabetes mellitus with diabetic chronic kidney disease: Secondary | ICD-10-CM | POA: Insufficient documentation

## 2015-07-29 DIAGNOSIS — Z8249 Family history of ischemic heart disease and other diseases of the circulatory system: Secondary | ICD-10-CM | POA: Insufficient documentation

## 2015-07-29 DIAGNOSIS — J111 Influenza due to unidentified influenza virus with other respiratory manifestations: Secondary | ICD-10-CM | POA: Diagnosis not present

## 2015-07-29 DIAGNOSIS — M791 Myalgia: Secondary | ICD-10-CM | POA: Diagnosis not present

## 2015-07-29 DIAGNOSIS — R531 Weakness: Secondary | ICD-10-CM | POA: Insufficient documentation

## 2015-07-29 DIAGNOSIS — E871 Hypo-osmolality and hyponatremia: Secondary | ICD-10-CM | POA: Insufficient documentation

## 2015-07-29 DIAGNOSIS — I700 Atherosclerosis of aorta: Secondary | ICD-10-CM | POA: Diagnosis not present

## 2015-07-29 DIAGNOSIS — Z7982 Long term (current) use of aspirin: Secondary | ICD-10-CM | POA: Diagnosis not present

## 2015-07-29 DIAGNOSIS — R05 Cough: Secondary | ICD-10-CM | POA: Insufficient documentation

## 2015-07-29 DIAGNOSIS — R509 Fever, unspecified: Secondary | ICD-10-CM | POA: Insufficient documentation

## 2015-07-29 DIAGNOSIS — R001 Bradycardia, unspecified: Secondary | ICD-10-CM | POA: Diagnosis not present

## 2015-07-29 DIAGNOSIS — S32592A Other specified fracture of left pubis, initial encounter for closed fracture: Secondary | ICD-10-CM | POA: Insufficient documentation

## 2015-07-29 DIAGNOSIS — Z794 Long term (current) use of insulin: Secondary | ICD-10-CM | POA: Insufficient documentation

## 2015-07-29 DIAGNOSIS — I129 Hypertensive chronic kidney disease with stage 1 through stage 4 chronic kidney disease, or unspecified chronic kidney disease: Secondary | ICD-10-CM | POA: Diagnosis not present

## 2015-07-29 DIAGNOSIS — K219 Gastro-esophageal reflux disease without esophagitis: Secondary | ICD-10-CM | POA: Insufficient documentation

## 2015-07-29 DIAGNOSIS — Z9841 Cataract extraction status, right eye: Secondary | ICD-10-CM | POA: Diagnosis not present

## 2015-07-29 DIAGNOSIS — G834 Cauda equina syndrome: Secondary | ICD-10-CM | POA: Diagnosis not present

## 2015-07-29 DIAGNOSIS — I452 Bifascicular block: Secondary | ICD-10-CM | POA: Diagnosis not present

## 2015-07-29 DIAGNOSIS — I440 Atrioventricular block, first degree: Secondary | ICD-10-CM | POA: Insufficient documentation

## 2015-07-29 DIAGNOSIS — E11319 Type 2 diabetes mellitus with unspecified diabetic retinopathy without macular edema: Secondary | ICD-10-CM | POA: Diagnosis not present

## 2015-07-29 DIAGNOSIS — Z79899 Other long term (current) drug therapy: Secondary | ICD-10-CM | POA: Diagnosis not present

## 2015-07-29 DIAGNOSIS — E1165 Type 2 diabetes mellitus with hyperglycemia: Secondary | ICD-10-CM | POA: Diagnosis not present

## 2015-07-29 DIAGNOSIS — M6282 Rhabdomyolysis: Secondary | ICD-10-CM | POA: Insufficient documentation

## 2015-07-29 DIAGNOSIS — Z885 Allergy status to narcotic agent status: Secondary | ICD-10-CM | POA: Diagnosis not present

## 2015-07-29 DIAGNOSIS — M8580 Other specified disorders of bone density and structure, unspecified site: Secondary | ICD-10-CM | POA: Diagnosis not present

## 2015-07-29 DIAGNOSIS — N179 Acute kidney failure, unspecified: Secondary | ICD-10-CM | POA: Diagnosis not present

## 2015-07-29 DIAGNOSIS — Z91041 Radiographic dye allergy status: Secondary | ICD-10-CM | POA: Diagnosis not present

## 2015-07-29 DIAGNOSIS — R109 Unspecified abdominal pain: Secondary | ICD-10-CM | POA: Diagnosis not present

## 2015-07-29 DIAGNOSIS — J09X2 Influenza due to identified novel influenza A virus with other respiratory manifestations: Secondary | ICD-10-CM | POA: Diagnosis not present

## 2015-07-29 DIAGNOSIS — R140 Abdominal distension (gaseous): Secondary | ICD-10-CM | POA: Insufficient documentation

## 2015-07-29 DIAGNOSIS — R319 Hematuria, unspecified: Secondary | ICD-10-CM | POA: Insufficient documentation

## 2015-07-29 DIAGNOSIS — N189 Chronic kidney disease, unspecified: Secondary | ICD-10-CM | POA: Insufficient documentation

## 2015-07-29 LAB — CBC WITH DIFFERENTIAL/PLATELET
BASOS ABS: 0.1 10*3/uL (ref 0–0.1)
BASOS PCT: 1 %
EOS PCT: 0 %
Eosinophils Absolute: 0 10*3/uL (ref 0–0.7)
HEMATOCRIT: 34.1 % — AB (ref 40.0–52.0)
Hemoglobin: 11.7 g/dL — ABNORMAL LOW (ref 13.0–18.0)
LYMPHS PCT: 8 %
Lymphs Abs: 0.7 10*3/uL — ABNORMAL LOW (ref 1.0–3.6)
MCH: 28.5 pg (ref 26.0–34.0)
MCHC: 34.3 g/dL (ref 32.0–36.0)
MCV: 83.3 fL (ref 80.0–100.0)
Monocytes Absolute: 0.8 10*3/uL (ref 0.2–1.0)
Monocytes Relative: 10 %
NEUTROS ABS: 7 10*3/uL — AB (ref 1.4–6.5)
Neutrophils Relative %: 81 %
PLATELETS: 157 10*3/uL (ref 150–440)
RBC: 4.1 MIL/uL — AB (ref 4.40–5.90)
RDW: 13.6 % (ref 11.5–14.5)
WBC: 8.6 10*3/uL (ref 3.8–10.6)

## 2015-07-29 LAB — GLUCOSE, CAPILLARY
GLUCOSE-CAPILLARY: 188 mg/dL — AB (ref 65–99)
Glucose-Capillary: 143 mg/dL — ABNORMAL HIGH (ref 65–99)

## 2015-07-29 LAB — LACTIC ACID, PLASMA: LACTIC ACID, VENOUS: 1 mmol/L (ref 0.5–2.0)

## 2015-07-29 LAB — COMPREHENSIVE METABOLIC PANEL
ALT: 15 U/L — ABNORMAL LOW (ref 17–63)
ANION GAP: 9 (ref 5–15)
AST: 29 U/L (ref 15–41)
Albumin: 3.7 g/dL (ref 3.5–5.0)
Alkaline Phosphatase: 50 U/L (ref 38–126)
BILIRUBIN TOTAL: 0.8 mg/dL (ref 0.3–1.2)
BUN: 38 mg/dL — ABNORMAL HIGH (ref 6–20)
CHLORIDE: 105 mmol/L (ref 101–111)
CO2: 19 mmol/L — ABNORMAL LOW (ref 22–32)
Calcium: 8.3 mg/dL — ABNORMAL LOW (ref 8.9–10.3)
Creatinine, Ser: 2.02 mg/dL — ABNORMAL HIGH (ref 0.61–1.24)
GFR, EST AFRICAN AMERICAN: 35 mL/min — AB (ref >60)
GFR, EST NON AFRICAN AMERICAN: 30 mL/min — AB (ref >60)
Glucose, Bld: 161 mg/dL — ABNORMAL HIGH (ref 65–99)
POTASSIUM: 4.7 mmol/L (ref 3.5–5.1)
Sodium: 133 mmol/L — ABNORMAL LOW (ref 135–145)
TOTAL PROTEIN: 7.1 g/dL (ref 6.5–8.1)

## 2015-07-29 LAB — INFLUENZA PANEL BY PCR (TYPE A & B)
H1N1FLUPCR: NOT DETECTED
INFLBPCR: NEGATIVE
Influenza A By PCR: POSITIVE — AB

## 2015-07-29 MED ORDER — SODIUM CHLORIDE 0.9 % IV BOLUS (SEPSIS)
1000.0000 mL | Freq: Once | INTRAVENOUS | Status: AC
  Administered 2015-07-29: 1000 mL via INTRAVENOUS

## 2015-07-29 MED ORDER — ACETAMINOPHEN 650 MG RE SUPP
650.0000 mg | Freq: Four times a day (QID) | RECTAL | Status: DC | PRN
Start: 2015-07-29 — End: 2015-08-01

## 2015-07-29 MED ORDER — OSELTAMIVIR PHOSPHATE 75 MG PO CAPS: 75.0000 mg | ORAL_CAPSULE | Freq: Two times a day (BID) | ORAL | Status: DC

## 2015-07-29 MED ORDER — HEPARIN SODIUM (PORCINE) 5000 UNIT/ML IJ SOLN
5000.0000 [IU] | Freq: Three times a day (TID) | INTRAMUSCULAR | Status: DC
  Administered 2015-07-29 – 2015-07-31 (×5): 5000 [IU] via SUBCUTANEOUS
  Filled 2015-07-29 (×5): qty 1

## 2015-07-29 MED ORDER — MENTHOL 3 MG MT LOZG
1.0000 | LOZENGE | OROMUCOSAL | Status: DC | PRN
  Filled 2015-07-29: qty 9

## 2015-07-29 MED ORDER — OXYCODONE HCL 5 MG PO TABS
5.0000 mg | ORAL_TABLET | ORAL | Status: DC | PRN
  Administered 2015-07-31: 5 mg via ORAL
  Filled 2015-07-29: qty 1

## 2015-07-29 MED ORDER — INSULIN ASPART 100 UNIT/ML ~~LOC~~ SOLN
0.0000 [IU] | Freq: Every day | SUBCUTANEOUS | Status: DC
  Administered 2015-07-31: 2 [IU] via SUBCUTANEOUS
  Filled 2015-07-29: qty 2

## 2015-07-29 MED ORDER — AMITRIPTYLINE HCL 10 MG PO TABS
20.0000 mg | ORAL_TABLET | Freq: Every day | ORAL | Status: DC
  Administered 2015-07-29 – 2015-07-31 (×3): 20 mg via ORAL
  Filled 2015-07-29 (×3): qty 2

## 2015-07-29 MED ORDER — ACETAMINOPHEN 325 MG PO TABS
650.0000 mg | ORAL_TABLET | Freq: Four times a day (QID) | ORAL | Status: DC | PRN
Start: 2015-07-29 — End: 2015-08-01
  Administered 2015-07-31: 650 mg via ORAL
  Filled 2015-07-29: qty 2

## 2015-07-29 MED ORDER — ONDANSETRON HCL 4 MG/2ML IJ SOLN: 4.0000 mg | Freq: Four times a day (QID) | INTRAMUSCULAR | Status: DC | PRN

## 2015-07-29 MED ORDER — LISINOPRIL 20 MG PO TABS
20.0000 mg | ORAL_TABLET | Freq: Every day | ORAL | Status: DC
  Administered 2015-07-29 – 2015-08-01 (×4): 20 mg via ORAL
  Filled 2015-07-29 (×4): qty 1

## 2015-07-29 MED ORDER — SODIUM CHLORIDE 0.9 % IV SOLN
INTRAVENOUS | Status: DC
  Administered 2015-07-29 – 2015-07-31 (×4): via INTRAVENOUS

## 2015-07-29 MED ORDER — SIMVASTATIN 20 MG PO TABS
10.0000 mg | ORAL_TABLET | Freq: Every day | ORAL | Status: DC
  Administered 2015-07-29 – 2015-07-31 (×3): 10 mg via ORAL
  Filled 2015-07-29 (×3): qty 1

## 2015-07-29 MED ORDER — GUAIFENESIN 100 MG/5ML PO SOLN
10.0000 mL | ORAL | Status: DC | PRN
  Administered 2015-07-29: 400 mg via ORAL
  Administered 2015-07-29 – 2015-07-31 (×3): 200 mg via ORAL
  Filled 2015-07-29 (×5): qty 10

## 2015-07-29 MED ORDER — CHOLECALCIFEROL 10 MCG (400 UNIT) PO TABS
400.0000 [IU] | ORAL_TABLET | Freq: Two times a day (BID) | ORAL | Status: DC
  Administered 2015-07-29 – 2015-08-01 (×6): 400 [IU] via ORAL
  Filled 2015-07-29 (×6): qty 1

## 2015-07-29 MED ORDER — OSELTAMIVIR PHOSPHATE 30 MG PO CAPS
30.0000 mg | ORAL_CAPSULE | Freq: Two times a day (BID) | ORAL | Status: DC
  Administered 2015-07-29 – 2015-08-01 (×6): 30 mg via ORAL
  Filled 2015-07-29 (×7): qty 1

## 2015-07-29 MED ORDER — INSULIN ASPART 100 UNIT/ML ~~LOC~~ SOLN
0.0000 [IU] | Freq: Three times a day (TID) | SUBCUTANEOUS | Status: DC
  Administered 2015-07-31 (×2): 1 [IU] via SUBCUTANEOUS
  Administered 2015-08-01: 3 [IU] via SUBCUTANEOUS
  Filled 2015-07-29: qty 1
  Filled 2015-07-29: qty 3
  Filled 2015-07-29: qty 1

## 2015-07-29 MED ORDER — AMLODIPINE BESYLATE 10 MG PO TABS
10.0000 mg | ORAL_TABLET | Freq: Every day | ORAL | Status: DC
  Administered 2015-07-29 – 2015-08-01 (×4): 10 mg via ORAL
  Filled 2015-07-29 (×4): qty 1

## 2015-07-29 MED ORDER — ASPIRIN EC 81 MG PO TBEC
81.0000 mg | DELAYED_RELEASE_TABLET | Freq: Every day | ORAL | Status: DC
  Administered 2015-07-29 – 2015-07-31 (×3): 81 mg via ORAL
  Filled 2015-07-29 (×3): qty 1

## 2015-07-29 MED ORDER — ONDANSETRON HCL 4 MG PO TABS: 4.0000 mg | ORAL_TABLET | Freq: Four times a day (QID) | ORAL | Status: DC | PRN

## 2015-07-29 MED ORDER — INSULIN GLARGINE 100 UNIT/ML ~~LOC~~ SOLN
54.0000 [IU] | Freq: Every day | SUBCUTANEOUS | Status: DC
  Administered 2015-07-29: 54 [IU] via SUBCUTANEOUS
  Filled 2015-07-29 (×3): qty 0.54

## 2015-07-29 MED ORDER — PANTOPRAZOLE SODIUM 40 MG PO TBEC
40.0000 mg | DELAYED_RELEASE_TABLET | Freq: Every day | ORAL | Status: DC
  Administered 2015-07-29 – 2015-08-01 (×4): 40 mg via ORAL
  Filled 2015-07-29 (×4): qty 1

## 2015-07-29 NOTE — ED Notes (Signed)
Critical Lab" positive for Flu type A", Dr.padowski notified

## 2015-07-29 NOTE — ED Provider Notes (Signed)
[facility] Emergency Department Provider Note  Time seen: 12:32 PM  I have reviewed the triage vital signs and the nursing notes.   HISTORY  Chief Complaint Fever    HPI [name] is a 75 y.o. male with a past medical history of diabetes, gastric reflux, hypertension, presents the emergency department with 4 days of cough, generalized weakness, fever. Patient with fever to 101.5, EMS gave 975 mg of Tylenol and 500 cc of fluid. Upon arrival patient has temperature of 99.9 orally. Patient is coughing, feels short of breath, respiratory rate in the 20s. States he has not had any sputum production. Denies any chest pain. Patient had a fall yesterday due to generalized weakness, again felt very weak today cannot get out of the car to go to his doctor's office so they called EMS to bring the patient to the emergency department for evaluation.Describes his weakness is significant. Cough is moderate.     Past Medical History  Diagnosis Date  . Carcinoma of skin   . Diabetic retinopathy ([place])   . Diabetes mellitus without complication ([place])   . GERD (gastroesophageal reflux disease)   . Coronary artery disease 2005    CABG X 7 in 2005  . Hypertension   . Cauda equina compression [facility])     Patient Active Problem List   Diagnosis Date Noted  . Diabetes ([place]) 02/11/2015  . Cauda equina compression (Normal) 08/23/2013  . Bradycardia 04/06/2013  . Coronary artery disease   . Hypertension     Past Surgical History  Procedure Laterality Date  . Cardiac catheterization  2005  . Coronary artery bypass graft  2005     [facility]G x 7 at [facility]   . Cataract extraction  2014    right eye   . Knee surgery      left knee    Current Outpatient Rx  Name  Route  Sig  Dispense  Refill  . ACCU-CHEK FASTCLIX LANCETS MISC               . Acetaminophen 500 MG coapsule               . amitriptyline (ELAVIL) 10 MG tablet   Oral   Take 2 tablets (20 mg  total) by mouth at bedtime.   180 tablet   3   . amLODipine (NORVASC) 10 MG tablet   Oral   Take 10 mg by mouth daily.         Marland Kitchen aspirin 81 MG tablet   Oral   Take 81 mg by mouth daily.         . Cholecalciferol (VITAMIN D) 400 UNITS capsule   Oral   Take 400 Units by mouth 2 (two) times daily.         Marland Kitchen glipiZIDE (GLUCOTROL) 5 MG tablet   Oral   Take 2 tablets (10 mg total) by mouth 2 (two) times daily before a meal.   120 tablet   5   . insulin glargine (LANTUS) 100 UNIT/ML injection   Subcutaneous   Inject 0.54 mLs (54 Units total) into the skin at bedtime.   10 mL   12   . lisinopril (PRINIVIL,ZESTRIL) 20 MG tablet   Oral   Take 20 mg by mouth daily.         Marland Kitchen omeprazole (PRILOSEC) 20 MG capsule   Oral   Take by mouth.         . simvastatin (ZOCOR)  10 MG tablet   Oral   Take 1 tablet (10 mg total) by mouth daily at 6 PM.   90 tablet   3     Allergies Codeine and Ivp dye  Family History  Problem Relation Age of Onset  . Hypertension Sister   . Hyperlipidemia Sister     Social History Social History  Substance Use Topics  . Smoking status: Never Smoker   . Smokeless tobacco: Never Used  . Alcohol Use: 0.0 oz/week    0 Standard drinks or equivalent per week     Comment: occasional.    Review of Systems Constitutional: Positive for fever 101.5 at home. Cardiovascular: Negative for chest pain. Respiratory: Positive for shortness of breath. Positive for cough. Denies sputum production. Gastrointestinal: Negative for abdominal pain, vomiting and diarrhea. Neurological: Negative for headache 10-point ROS otherwise negative.  ____________________________________________   PHYSICAL EXAM:  VITAL SIGNS: ED Triage Vitals  Enc Vitals Group     BP 07/29/15 1146 150/64 mmHg     Pulse Rate 07/29/15 1146 83     Resp 07/29/15 1146 20     Temp 07/29/15 1146 99.9 F (37.7 C)     Temp Source 07/29/15 1146 Oral     SpO2 07/29/15 1146 95 %      Weight 07/29/15 1146 210 lb (95.255 kg)     Height 07/29/15 1146 6\' 2"  (1.88 m)     Head Cir --      Peak Flow --      Pain Score --      Pain Loc --      Pain Edu? --      Excl. in [place]? --     Constitutional: Alert and oriented. Well appearing and in no distress. Eyes: Normal exam ENT   Head: Normocephalic and atraumatic   Mouth/Throat: Mucous membranes are moist. Cardiovascular: Normal rate, regular rhythm. No murmur Respiratory: Normal respiratory effort without tachypnea nor retractions. Breath sounds are clear a Gastrointestinal: Soft and nontender. No distention.   Musculoskeletal: Nontender with normal range of motion in all extremities. Neurologic:  Normal speech and language. No gross focal neurologic deficits Skin:  Skin is warm, dry and intact.  Psychiatric: Mood and affect are normal. Speech and behavior are normal.   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 Nonspecific ST changes. No ST elevations.  ____________________________________________    RADIOLOGY  Chest x-ray shows no acute abnormality   INITIAL IMPRESSION / ASSESSMENT AND PLAN / ED COURSE  Pertinent labs & imaging results that were available during my care of the patient were reviewed by me and considered in my medical decision making (see chart for details).  Patient presents the emergency department 4 days of cough, congestion, generalized weakness. Patient had a fall yesterday due to generalized weakness, again today too weak to go to the doctor's office so they called EMS to bring him to the emergency department. Patient febrile to 101.5 at home. Here with a respiratory rate of 24, meeting sepsis criteria. Sepsis protocol initiated. We will check labs, chest x-ray, and closer monitor in the  emergency department.  Labs are largely within normal limits. Patient remains tachypneic at 25 bpm. Remains with generalized weakness. Influenza came back positive. Given the patient's generalized weakness with positive influenza test we will begin Tamiflu and admitted to the  hospital for further treatment.  ____________________________________________   FINAL CLINICAL IMPRESSION(S) / ED DIAGNOSES  Cough Fever Generalized weakness Influenza  Harvest Dark, MD 07/29/15 1506

## 2015-07-29 NOTE — H&P (Signed)
[facility] [place] NAME: [name]    MR#:  [id number]  DATE OF BIRTH:  [birth year]   DATE OF ADMISSION:  07/29/2015  PRIMARY CARE PHYSICIAN: Dicky Doe, MD   REQUESTING/REFERRING PHYSICIAN: Paduchowski  CHIEF COMPLAINT:  Weakness  HISTORY OF PRESENT ILLNESS:  [name]  is a 75 y.o. male with a known history of type 2 diabetes insulin requiring, essential hypertension presenting with generalized weakness. States has cough nonproductive about 3 days total duration now with associated myalgias he actually fell and was unable to get off the floor last evening. Today he had difficulty rising from a chair. Emergency department course: Influenza A positive,persistent weakness.  PAST MEDICAL HISTORY:   Past Medical History  Diagnosis Date  . Carcinoma of skin   . Diabetic retinopathy ([place])   . Diabetes mellitus without complication ([place])   . GERD (gastroesophageal reflux disease)   . Coronary artery disease 2005    CABG X 7 in 2005  . Hypertension   . Cauda equina compression ([place])     PAST SURGICAL HISTORY:   Past Surgical History  Procedure Laterality Date  . Cardiac catheterization  2005  . Coronary artery bypass graft  2005     CABG x 7 at [facility]   . Cataract extraction  2014    right eye   . Knee surgery      left knee    SOCIAL HISTORY:   Social History  Substance Use Topics  . Smoking status: Never Smoker   . Smokeless tobacco: Never Used  . Alcohol Use: 0.0 oz/week    0 Standard drinks or equivalent per week     Comment: occasional.    FAMILY HISTORY:   Family History  Problem Relation Age of Onset  . Hypertension Sister   . Hyperlipidemia Sister     DRUG ALLERGIES:   Allergies  Allergen Reactions  . Codeine Other (See Comments)    Reaction:  Unknown   . Ivp Dye [Iodinated Diagnostic Agents] Other (See Comments)    Reaction:  Unknown     REVIEW OF SYSTEMS:  REVIEW OF  SYSTEMS:  CONSTITUTIONAL: Denies fevers, chills, positive fatigue, weakness.  EYES: Denies blurred vision, double vision, or eye pain.  EARS, NOSE, THROAT: Denies tinnitus, ear pain, hearing loss.  RESPIRATORY: Positive cough, denies shortness of breath, wheezing  CARDIOVASCULAR: Denies chest pain, palpitations, edema.  GASTROINTESTINAL: Denies nausea, vomiting, diarrhea, abdominal pain.  GENITOURINARY: Denies dysuria, hematuria.  ENDOCRINE: Denies nocturia or thyroid problems. HEMATOLOGIC AND LYMPHATIC: Denies easy bruising or bleeding.  SKIN: Denies rash or lesions.  MUSCULOSKELETAL: Denies pain in neck, back, shoulder, knees, hips, or further arthritic symptoms. Positive Myalgias NEUROLOGIC: Denies paralysis, paresthesias.  PSYCHIATRIC: Denies anxiety or depressive symptoms. Otherwise full review of systems performed by me is negative.   MEDICATIONS AT HOME:   Prior to Admission medications   Medication Sig Start Date End Date Taking? Authorizing Provider  acetaminophen (TYLENOL) 500 MG tablet Take 1,000 mg by mouth every 6 (six) hours as needed for mild pain, fever or headache.   Yes Historical Provider, MD  amitriptyline (ELAVIL) 10 MG tablet Take 2 tablets (20 mg total) by mouth at bedtime. 05/20/15  Yes [name], MD  amLODipine (NORVASC) 10 MG tablet Take 10 mg by mouth daily.   Yes Historical Provider, MD  aspirin EC 81 MG tablet Take 81 mg by mouth at bedtime.   Yes  Historical Provider, MD  glipiZIDE (GLUCOTROL) 5 MG tablet Take 2 tablets (10 mg total) by mouth 2 (two) times daily before a meal. 05/27/15  Yes [name], MD  insulin glargine (LANTUS) 100 UNIT/ML injection Inject 0.54 mLs (54 Units total) into the skin at bedtime. 02/11/15  Yes [name], MD  lisinopril (PRINIVIL,ZESTRIL) 20 MG tablet Take 20 mg by mouth daily.   Yes Historical Provider, MD  omeprazole (PRILOSEC) 20 MG capsule Take 20 mg by mouth daily.    Yes Historical Provider, MD   simvastatin (ZOCOR) 10 MG tablet Take 1 tablet (10 mg total) by mouth daily at 6 PM. 05/20/15  Yes [name], MD  Cholecalciferol (VITAMIN D) 400 UNITS capsule Take 400 Units by mouth 2 (two) times daily.    Historical Provider, MD      VITAL SIGNS:  Blood pressure 137/68, pulse 68, temperature 99.9 F (37.7 C), temperature source Oral, resp. rate 24, height 6\' 2"  (1.88 m), weight 210 lb (95.255 kg), SpO2 99 %.  PHYSICAL EXAMINATION:  VITAL SIGNS: Filed Vitals:   07/29/15 1400 07/29/15 1430  BP: 123/39 137/68  Pulse: 69 68  Temp:    Resp: 24    GENERAL:75 y.o.male currently in no acute distress.  HEAD: Normocephalic, atraumatic.  EYES: Pupils equal, round, reactive to light. Extraocular muscles intact. No scleral icterus.  MOUTH: Moist mucosal membrane. Dentition intact. No abscess noted.  EAR, NOSE, THROAT: Clear without exudates. No external lesions.  NECK: Supple. No thyromegaly. No nodules. No JVD.  PULMONARY: Clear to ascultation, without wheeze rails or rhonci. No use of accessory muscles, Good respiratory effort. good air entry bilaterally CHEST: Nontender to palpation.  CARDIOVASCULAR: S1 and S2. Regular rate and rhythm. No murmurs, rubs, or gallops. No edema. Pedal pulses 2+ bilaterally.  GASTROINTESTINAL: Soft, nontender, nondistended. No masses. Positive bowel sounds. No hepatosplenomegaly.  MUSCULOSKELETAL: No swelling, clubbing, or edema. Range of motion full in all extremities.  NEUROLOGIC: Cranial nerves II through XII are intact. No gross focal neurological deficits. Sensation intact. Reflexes intact.  SKIN: No ulceration, lesions, rashes, or cyanosis. Skin warm and dry. Turgor intact.  PSYCHIATRIC: Mood, affect within normal limits. The patient is awake, alert and oriented x 3. Insight, judgment intact.    LABORATORY PANEL:   CBC  Recent Labs Lab 07/29/15 1150  WBC 8.6  HGB 11.7*  HCT 34.1*  PLT 157    ------------------------------------------------------------------------------------------------------------------  Chemistries   Recent Labs Lab 07/29/15 1150  NA 133*  K 4.7  CL 105  CO2 19*  GLUCOSE 161*  BUN 38*  CREATININE 2.02*  CALCIUM 8.3*  AST 29  ALT 15*  ALKPHOS 50  BILITOT 0.8   ------------------------------------------------------------------------------------------------------------------  Cardiac Enzymes No results for input(s): TROPONINI in the last 168 hours. ------------------------------------------------------------------------------------------------------------------  RADIOLOGY:  Dg Chest 2 View  07/29/2015  CLINICAL DATA:  75 year old male with fever cough and generalized weakness for 3 days. Currently febrile. Initial encounter. EXAM: CHEST  2 VIEW COMPARISON:  03/10/2005. FINDINGS: Sequelae of CABG again noted. Stable lung volumes with no pneumothorax, pulmonary edema, pleural effusion or consolidation. No acute or confluent pulmonary opacity. Chronic right lateral seventh and eighth rib fractures with mild opacity appear unchanged. Negative visible bowel gas pattern. Osteopenia. No acute osseous abnormality identified. Calcified aortic atherosclerosis. IMPRESSION: No acute cardiopulmonary abnormality. Electronically Signed   By: Genevie Ann M.D.   On: 07/29/2015 12:35    EKG:   Orders placed or performed during the  hospital encounter of 07/29/15  . EKG 12-Lead  . EKG 12-Lead    IMPRESSION AND PLAN:   75 year old Caucasian gentleman history of type 2 diabetes insulin requiring presenting with weakness  1. Acute kidney injury: Likely prerenal in etiology IV fluid hydration follow urine output renal function 2. Influenza A: Tamiflu 3. Generalized weakness physical therapy consult IV fluid hydration 4. Type 2 diabetes hold oral agents at insulin sliding scale continue basal insulin Essential hypertension: Norvasc, lisinopril 5. Venous  thromboembolism prophylactic: Heparin subcutaneous    All the records are reviewed and case discussed with ED provider. Management plans discussed with the patient, family and they are in agreement.  CODE STATUS: Full  TOTAL TIME TAKING CARE OF THIS PATIENT: 35 minutes.    Airel Magadan,  Karenann Cai.D on 07/29/2015 at 3:27 PM  Between 7am to 6pm - Pager - [phone]  After 6pm: House Pager: - [facility]  Office  [phone]  CC: Primary care physician; Dicky Doe, MD

## 2015-07-29 NOTE — ED Notes (Signed)
Pt here via EMS , pt reports cough, fever and generalized weakness since Saturday, EMS reports temp of 101.5; 975mg  of tylenol given with 500cc fluid bolus by EMS, pt denies any other concerns or symptoms

## 2015-07-29 NOTE — Telephone Encounter (Signed)
Patient came to appointment and he could not quite park straight due to SOB and trouble standing. Complained of SOB and just feeling weak. After checking Pulse ox 95 and HR 95 we decided to call 911.He was taken by EMS to [facility].I called his wife and she asked if she could just come get him. I explained it took 3 men to assist in getting him in ambulance and she should let them take him to [facility]. Wife thanked [place] for our swift actions in assisting him and agreed to meet him at [facility].

## 2015-07-29 NOTE — Telephone Encounter (Signed)
Ok-jh 

## 2015-07-30 ENCOUNTER — Observation Stay: Payer: PPO

## 2015-07-30 DIAGNOSIS — N179 Acute kidney failure, unspecified: Secondary | ICD-10-CM | POA: Diagnosis not present

## 2015-07-30 DIAGNOSIS — R531 Weakness: Secondary | ICD-10-CM | POA: Diagnosis not present

## 2015-07-30 DIAGNOSIS — R339 Retention of urine, unspecified: Secondary | ICD-10-CM | POA: Diagnosis not present

## 2015-07-30 DIAGNOSIS — N476 Balanoposthitis: Secondary | ICD-10-CM | POA: Diagnosis not present

## 2015-07-30 DIAGNOSIS — N471 Phimosis: Secondary | ICD-10-CM | POA: Insufficient documentation

## 2015-07-30 DIAGNOSIS — R338 Other retention of urine: Secondary | ICD-10-CM | POA: Diagnosis not present

## 2015-07-30 DIAGNOSIS — J111 Influenza due to unidentified influenza virus with other respiratory manifestations: Secondary | ICD-10-CM | POA: Diagnosis not present

## 2015-07-30 LAB — OSMOLALITY, URINE: Osmolality, Ur: 490 mOsm/kg (ref 300–900)

## 2015-07-30 LAB — URINALYSIS COMPLETE WITH MICROSCOPIC (ARMC ONLY)
BILIRUBIN URINE: NEGATIVE
Bacteria, UA: NONE SEEN
Glucose, UA: 50 mg/dL — AB
KETONES UR: NEGATIVE mg/dL
NITRITE: NEGATIVE
PH: 5 (ref 5.0–8.0)
Protein, ur: 100 mg/dL — AB
SPECIFIC GRAVITY, URINE: 1.014 (ref 1.005–1.030)

## 2015-07-30 LAB — HEMOGLOBIN A1C: HEMOGLOBIN A1C: 7.2 % — AB (ref 4.0–6.0)

## 2015-07-30 LAB — BASIC METABOLIC PANEL
ANION GAP: 9 (ref 5–15)
BUN: 40 mg/dL — ABNORMAL HIGH (ref 6–20)
CO2: 19 mmol/L — ABNORMAL LOW (ref 22–32)
Calcium: 7.9 mg/dL — ABNORMAL LOW (ref 8.9–10.3)
Chloride: 103 mmol/L (ref 101–111)
Creatinine, Ser: 2.01 mg/dL — ABNORMAL HIGH (ref 0.61–1.24)
GFR, EST AFRICAN AMERICAN: 35 mL/min — AB (ref >60)
GFR, EST NON AFRICAN AMERICAN: 30 mL/min — AB (ref >60)
Glucose, Bld: 90 mg/dL (ref 65–99)
POTASSIUM: 4.2 mmol/L (ref 3.5–5.1)
SODIUM: 131 mmol/L — AB (ref 135–145)

## 2015-07-30 LAB — CK: Total CK: 552 U/L — ABNORMAL HIGH (ref 49–397)

## 2015-07-30 LAB — GLUCOSE, CAPILLARY
GLUCOSE-CAPILLARY: 57 mg/dL — AB (ref 65–99)
GLUCOSE-CAPILLARY: 97 mg/dL (ref 65–99)
Glucose-Capillary: 138 mg/dL — ABNORMAL HIGH (ref 65–99)
Glucose-Capillary: 119 mg/dL — ABNORMAL HIGH (ref 65–99)

## 2015-07-30 MED ORDER — NYSTATIN-TRIAMCINOLONE 100000-0.1 UNIT/GM-% EX OINT
TOPICAL_OINTMENT | Freq: Two times a day (BID) | CUTANEOUS | Status: DC
  Administered 2015-07-30 – 2015-08-01 (×4): via TOPICAL
  Filled 2015-07-30: qty 15

## 2015-07-30 MED ORDER — TAMSULOSIN HCL 0.4 MG PO CAPS
0.4000 mg | ORAL_CAPSULE | Freq: Every day | ORAL | Status: DC
  Administered 2015-07-30 – 2015-08-01 (×3): 0.4 mg via ORAL
  Filled 2015-07-30 (×3): qty 1

## 2015-07-30 MED ORDER — GLUCERNA SHAKE PO LIQD
237.0000 mL | Freq: Three times a day (TID) | ORAL | Status: DC
  Administered 2015-07-30 – 2015-08-01 (×6): 237 mL via ORAL

## 2015-07-30 MED ORDER — GLUCERNA 1.2 CAL PO LIQD
237.0000 mL | Freq: Three times a day (TID) | ORAL | Status: DC
  Administered 2015-07-30: 237 mL via ORAL

## 2015-07-30 NOTE — Consult Note (Signed)
Urology Consult  I have been asked to see the patient by Dr. Charissa Bash , for evaluation and management of urinary retention/ difficulty Foley.  Chief Complaint: difficulty Foley/ urinary retention  History of Present Illness: [name] is a 75 y.o. year old male admitted to the hospital with influenza A.. He initially developed generalized weakness, coughing, myalgias and presented to his PCP who sent him to the emergency room for further care. In the emergency room, he was found to have acute kidney injury with a creatinine elevated 2 which at that time was thought to be prerenal.    Today, he had some difficulty voiding and was found to have greater than a liter in his bladder on bladder scan. He also has a history of phimosis and has developed some redness/irritation of his foreskin along with redness in the scrotum over the past few days. Nursing attempted to place a Foley but had difficulty secondary to phimosis therefore urology was asked to assist.  [name] does have a history of urinary retention requiring a Foley catheter during the length of his hospitalization for back surgery several years ago at [facility]. Otherwise commonly he denies any history of weak stream, difficulty voiding. He does get up 2 or 3 times at night to urinate. Incidentally, over the past month or so, he has started wearing depends at night due to urinary leakage at night which is a new symptom. He does not have a urologist and does not take any medications for his prostate. No history of urinary tract infections or gross hematuria.  Renal ultrasound today 07/30/2015 shows no evidence of hydronephrosis bilaterally.   Past Medical History  Diagnosis Date  . Carcinoma of skin   . Diabetic retinopathy ([place])   . Diabetes mellitus without complication ([place])   . GERD (gastroesophageal reflux disease)   . Coronary artery disease 2005    CABG X 7 in 2005  . Hypertension   . Cauda equina compression [facility])      Past Surgical History  Procedure Laterality Date  . Cardiac catheterization  2005  . Coronary artery bypass graft  2005     CABG x 7 at [facility]   . Cataract extraction  2014    right eye   . Knee surgery      left knee    Home Medications:    Medication List    ASK your doctor about these medications        acetaminophen 500 MG tablet  Commonly known as:  TYLENOL  Take 1,000 mg by mouth every 6 (six) hours as needed for mild pain, fever or headache.     amitriptyline 10 MG tablet  Commonly known as:  ELAVIL  Take 2 tablets (20 mg total) by mouth at bedtime.     amLODipine 10 MG tablet  Commonly known as:  NORVASC  Take 10 mg by mouth daily.     aspirin EC 81 MG tablet  Take 81 mg by mouth at bedtime.     glipiZIDE 5 MG tablet  Commonly known as:  GLUCOTROL  Take 2 tablets (10 mg total) by mouth 2 (two) times daily before a meal.     insulin glargine 100 UNIT/ML injection  Commonly known as:  LANTUS  Inject 0.54 mLs (54 Units total) into the skin at bedtime.     lisinopril 20 MG tablet  Commonly known as:  PRINIVIL,ZESTRIL  Take 20 mg by mouth daily.  omeprazole 20 MG capsule  Commonly known as:  PRILOSEC  Take 20 mg by mouth daily.     simvastatin 10 MG tablet  Commonly known as:  ZOCOR  Take 1 tablet (10 mg total) by mouth daily at 6 PM.     Vitamin D3 400 units Caps  Take 400 Units by mouth 2 (two) times daily.        Allergies:  Allergies  Allergen Reactions  . Ivp Dye [Iodinated Diagnostic Agents] Other (See Comments)    Reaction:  Unknown   . Codeine Nausea And Vomiting and Rash    Family History  Problem Relation Age of Onset  . Hypertension Sister   . Hyperlipidemia Sister     Social History:  reports that he has never smoked. He has never used smokeless tobacco. He reports that he drinks alcohol. He reports that he does not use illicit drugs.  ROS: A complete review of systems was performed.  All systems are negative  except for pertinent findings as noted other than weight gain over the past 2 weeks.    Physical Exam:  Vital signs in last 24 hours: Temp:  [99.4 F (37.4 C)-100.4 F (38 C)] 99.6 F (37.6 C) (02/08 1607) Pulse Rate:  [66-77] 66 (02/08 1607) Resp:  [17-19] 18 (02/08 1607) BP: (138-148)/(50-58) 148/58 mmHg (02/08 1607) SpO2:  [94 %-98 %] 98 % (02/08 1607) Constitutional:  Alert and oriented, No acute distress HEENT: [place] AT, moist mucus membranes.  Trachea midline, no masses Cardiovascular: No clubbing, cyanosis, or edema. Respiratory: Normal respiratory effort.  No increased WOB. GI: Abdomen is soft, nontender, nondistended, no abdominal masses GU: No CVA tenderness.  Erythematous, mildly edematous fairly phimotic foreskin, unable to retract.  Glands palpable within foreskin.  Scrotum with diffusely mild erythema without fluctuance/ tenderness/ edema. Testicles normal, nontender without masses. Skin: No rashes, bruises or suspicious lesions Lymph: No inguinal adenopathy Neurologic: Grossly intact, no focal deficits, moving all 4 extremities Psychiatric: Normal mood and affect   Laboratory Data:   Recent Labs  07/29/15 1150  WBC 8.6  HGB 11.7*  HCT 34.1*    Recent Labs  07/29/15 1150 07/30/15 0425  NA 133* 131*  K 4.7 4.2  CL 105 103  CO2 19* 19*  GLUCOSE 161* 90  BUN 38* 40*  CREATININE 2.02* 2.01*  CALCIUM 8.3* 7.9*   No results for input(s): LABPT, INR in the last 72 hours. No results for input(s): LABURIN in the last 72 hours. Results for orders placed or performed during the hospital encounter of 07/29/15  Culture, blood (routine x 2)     Status: None (Preliminary result)   Collection Time: 07/29/15 11:20 AM  Result Value Ref Range Status   Specimen Description BLOOD LEFT ASSIST CONTROL  Final   Special Requests BOTTLES DRAWN AEROBIC AND ANAEROBIC 1CC  Final   Culture NO GROWTH 1 DAY  Final   Report Status PENDING  Incomplete  Culture, blood (routine x 2)      Status: None (Preliminary result)   Collection Time: 07/29/15 11:50 AM  Result Value Ref Range Status   Specimen Description BLOOD LEFT ARM  Final   Special Requests   Final    BOTTLES DRAWN AEROBIC AND ANAEROBIC AERO 3CC ANA [place]   Culture NO GROWTH 1 DAY  Final   Report Status PENDING  Incomplete     Radiologic Imaging: Dg Chest 2 View  07/29/2015  CLINICAL DATA:  75 year old male with fever cough and generalized weakness  for 3 days. Currently febrile. Initial encounter. EXAM: CHEST  2 VIEW COMPARISON:  03/10/2005. FINDINGS: Sequelae of CABG again noted. Stable lung volumes with no pneumothorax, pulmonary edema, pleural effusion or consolidation. No acute or confluent pulmonary opacity. Chronic right lateral seventh and eighth rib fractures with mild opacity appear unchanged. Negative visible bowel gas pattern. Osteopenia. No acute osseous abnormality identified. Calcified aortic atherosclerosis. IMPRESSION: No acute cardiopulmonary abnormality. Electronically Signed   By: Genevie Ann M.D.   On: 07/29/2015 12:35   US Renal  07/30/2015  CLINICAL DATA:  Acute renal failure. EXAM: RENAL / URINARY TRACT ULTRASOUND COMPLETE COMPARISON:  None. FINDINGS: Right Kidney: Length: 12.7 cm. Echogenicity within normal limits. No mass or hydronephrosis visualized. There is a 1.4 cm simple appearing cyst in the lower pole of the right kidney. Left Kidney: Length: 12.7 cm. Echogenicity within normal limits. No mass or hydronephrosis visualized. Bladder: Appears normal for degree of bladder distention. Bilateral ureteral jets are seen. IMPRESSION: Normal appearance of the kidneys, apart from 1.4 cm simple appearing right renal cyst. Normal appearance of the urinary bladder with bilateral ureteral jets visualized. Electronically Signed   By: Fidela Salisbury M.D.   On: 07/30/2015 10:04   Procedure:  Patient prepped and draped in the normal sterile fashion. Unable to retract foreskin to directly visualize meatus.  Sterile gloved finger placed down to the level of the meatus and catheter run alongside of the finger until the meatus was intubated with a 16 French catheter. Catheter then advanced easily into the bladder. At this point in time, 1300 cc was drained from the bladder. Balloon was inflated with 10 cc of sterile water. Catheter was secured to the patient's left leg using a catheter secure device.  Impression/Assessment:  1.  Urinary retention- suspect this has been subacute with overflow incontinence at night exacerbated by acute illness, possible urinary tract infection, medication related, etc.  2. Phimosis/ Balanoposthitis- Recommend treatment with topical therapy, may consider circumcision in the future  3. AKI- s/p Foley, suspect 2/2 urinary obstruction.  Massive retention with 1300 cc in bladder.  Monitor for post obstructive diuresis.    4. Difficulty Foley- s/p successful placement  Plan:  -start Flomax and d/c home with this prescription -Foley x 7 days followed by outpatient voiding trial -trend Cr, UOP -UA/ Ucx -nystatin/ triamcinolone cream for balanopotsthisis   07/30/2015, 6:41 PM  Hollice Espy,  MD   Thank you for involving me in this patient's care.  Please page with any further questions or concerns.

## 2015-07-30 NOTE — Progress Notes (Signed)
Patient attempted to urinate,unsuccessful,  Bladder scan revealed >999.  Paged Dr. Ether Griffins, she will add order for insert foley and flomax.

## 2015-07-30 NOTE — Progress Notes (Signed)
Paged on call MD for urology.

## 2015-07-30 NOTE — Progress Notes (Signed)
Initial Nutrition Assessment   INTERVENTION:   Meals and Snacks: Cater to patient preferences. Pt may benefit from addition of Carb Modified diet order pending po intake and lab values. Medical Food Supplement Therapy: will clarify order for Glucerna Shakes in [facility] TID between meals as pt not on Glucerna 1.2 tube feeding; Glucerna Shake supplement provides 220 kcal and 10 grams of protein   NUTRITION DIAGNOSIS:   Altered nutrition lab value related to chronic illness as evidenced by meal completion < 50% (low blood sugars this am).  GOAL:   Patient will meet greater than or equal to 90% of their needs  MONITOR:    (Energy Intake, Electrolyte and renal Profile, Glucose Profile, Anthropometrics)  REASON FOR ASSESSMENT:   Diagnosis    ASSESSMENT:   Pt admitted with weakness and fevers. Pt with acute kidney injury and positive for influenza A on droplet precautions. Per RN note, pt with decreased FSBS this am, FSBS 138 this afternoon.  Past Medical History  Diagnosis Date  . Carcinoma of skin   . Diabetic retinopathy ([place])   . Diabetes mellitus without complication ([place])   . GERD (gastroesophageal reflux disease)   . Coronary artery disease 2005    CABG X 7 in 2005  . Hypertension   . Cauda equina compression [facility])      Diet Order:  Diet Heart Room service appropriate?: Yes; Fluid consistency:: Thin   Current Nutrition: Limited documentation as pt on isolation. 0% documented through the night last night.  (RD also noted pt with low blood sugars this am and was given orange juice and crackers). Per MD note, pt with relatively good appetite today. CNA reports pt ate decent today.   Food/Nutrition-Related History: Per MST no decrease in appetite PTA   Scheduled Medications:  . amitriptyline  20 mg Oral QHS  . amLODipine  10 mg Oral Daily  . aspirin EC  81 mg Oral QHS  . cholecalciferol  400 Units Oral BID  . feeding supplement (GLUCERNA SHAKE)  237 mL Oral TID BM  .  heparin  5,000 Units Subcutaneous 3 times per day  . insulin aspart  0-5 Units Subcutaneous QHS  . insulin aspart  0-9 Units Subcutaneous TID WC  . insulin glargine  54 Units Subcutaneous QHS  . lisinopril  20 mg Oral Daily  . oseltamivir  30 mg Oral BID  . pantoprazole  40 mg Oral Daily  . simvastatin  10 mg Oral q1800  . tamsulosin  0.4 mg Oral Daily    Continuous Medications:  . sodium chloride 75 mL/hr at 07/29/15 1809     Electrolyte/Renal Profile and Glucose Profile:   Recent Labs Lab 07/29/15 1150 07/30/15 0425  NA 133* 131*  K 4.7 4.2  CL 105 103  CO2 19* 19*  BUN 38* 40*  CREATININE 2.02* 2.01*  CALCIUM 8.3* 7.9*  GLUCOSE 161* 90   Protein Profile:   Recent Labs Lab 07/29/15 1150  ALBUMIN 3.7    Gastrointestinal Profile: Last BM:  07/29/2015   Nutrition-Focused Physical Exam Findings:  Unable to complete Nutrition-Focused physical exam at this time.    Weight Change: Per [facility] weight encounters, pt with weight gain   Height:   Ht Readings from Last 1 Encounters:  07/29/15 6\' 2"  (1.88 m)    Weight:   Wt Readings from Last 1 Encounters:  07/29/15 210 lb (95.255 kg)   Wt Readings from Last 10 Encounters:  07/29/15 210 lb (95.255 kg)  05/20/15 210 lb (  95.255 kg)  03/18/15 206 lb (93.441 kg)  02/11/15 206 lb 9.6 oz (93.713 kg)  04/06/13 201 lb 8 oz (91.4 kg)    BMI:  Body mass index is 26.95 kg/(m^2).   EDUCATION NEEDS:   Education needs no appropriate at this time   [place], [place], LDN Pager [phone] Weekend/On-Call Pager [phone]

## 2015-07-30 NOTE — Evaluation (Signed)
Physical Therapy Evaluation Patient Details Name: [name] MRN: [id number] DOB: 05/01/1941 Today's Date: 07/30/2015   History of Present Illness  Patient is a 75 y/o male that presents after contracting flu, having non-productive cough, fall, myalgias at home. Found to have AKI.   Clinical Impression  Patient reports he has progressively increased in strength since he initially felt weak and fell last Friday, and was able to stand without PT assistance from recliner and bedside chair. He did require assistance initially in standing from bed, secondary to LE weakness, and required assistance to elevate his trunk off the bed as well in supine to sit transfer. Patient demonstrated no loss of balance and good tolerance for ambulation in the room with RW, though this is far removed from his baseline which is an independent community ambulator. Patient was not interested in rehab, and given his progress would be appropriate for home health PT at discharge to progress his LE strengthening and balance for return to independent mobility.     Follow Up Recommendations Home health PT (Patient not interested in SNF discussion)    Equipment Recommendations  Rolling walker with 5" wheels    Recommendations for Other Services       Precautions / Restrictions Precautions Precautions: Fall Restrictions Weight Bearing Restrictions: No      Mobility  Bed Mobility Overal bed mobility: Needs Assistance Bed Mobility: Supine to Sit     Supine to sit: Min guard;Min assist     General bed mobility comments: Patient completes majority of transfer independently with bed rail, he requires min A x1 to complete transfer secondary to trunkal weakness.   Transfers Overall transfer level: Needs assistance Equipment used: Rolling walker (2 wheeled) Transfers: Sit to/from Stand Sit to Stand: Min assist;Min guard         General transfer comment: Patient requires multiple attempts to complete transfer  and min A x1 from low bed position. He is able to transfer from [facility] and recliner with cga-supervision assist with use of UEs.   Ambulation/Gait Ambulation/Gait assistance: Modified independent (Device/Increase time) Ambulation Distance (Feet): 40 Feet Assistive device: Rolling walker (2 wheeled) Gait Pattern/deviations: WFL(Within Functional Limits)   Gait velocity interpretation: Below normal speed for age/gender General Gait Details: Appropriate gait mechanics, though anterior lean while standing without RW indicating need for AD. Able to complete turns and navigate in room without assistance.   Stairs            Wheelchair Mobility    Modified Rankin (Stroke Patients Only)       Balance Overall balance assessment: Needs assistance Sitting-balance support: No upper extremity supported Sitting balance-Leahy Scale: Good     Standing balance support: Bilateral upper extremity supported Standing balance-Leahy Scale: Fair                               Pertinent Vitals/Pain Pain Assessment: No/denies pain    Home Living                        Prior Function                 Hand Dominance        Extremity/Trunk Assessment   Upper Extremity Assessment: Overall WFL for tasks assessed           Lower Extremity Assessment: Overall WFL for tasks assessed         Communication  Cognition Arousal/Alertness: Awake/alert Behavior During Therapy: WFL for tasks assessed/performed Overall Cognitive Status: Within Functional Limits for tasks assessed                      General Comments      Exercises Other Exercises Other Exercises: Patient assisted with bathroom transfer with cga-supervision x1 with RW.       Assessment/Plan    PT Assessment Patient needs continued PT services  PT Diagnosis Difficulty walking;Generalized weakness   PT Problem List Decreased strength;Decreased activity tolerance;Decreased  balance;Decreased mobility  PT Treatment Interventions DME instruction;Gait training;Stair training;Therapeutic activities;Therapeutic exercise;Balance training   PT Goals (Current goals can be found in the Care Plan section) Acute Rehab PT Goals Patient Stated Goal: To return to home mobility.  PT Goal Formulation: With patient Time For Goal Achievement: 08/13/15 Potential to Achieve Goals: Good    Frequency Min 2X/week   Barriers to discharge Decreased caregiver support Patient's wife is sick with flu currently as well.     Co-evaluation               End of Session Equipment Utilized During Treatment: Gait belt Activity Tolerance: Patient tolerated treatment well Patient left: in chair;with chair alarm set;with call bell/phone within reach Nurse Communication: Mobility status         Time: 1353-1420 PT Time Calculation (min) (ACUTE ONLY): 27 min   Charges:   PT Evaluation $PT Eval Moderate Complexity: 1 Procedure PT Treatments $Therapeutic Activity: 8-22 mins   PT G Codes:       Kerman Passey, PT, DPT    07/30/2015, 5:29 PM

## 2015-07-30 NOTE — Care Management Obs Status (Signed)
[place] NOTIFICATION   Patient Details  Name: MEMPHIS SHREINER MRN: [id number] Date of Birth: 12-12-1940   Medicare Observation Status Notification Given:  Yes    Marshell Garfinkel, RN 07/30/2015, 11:09 AM

## 2015-07-30 NOTE — Progress Notes (Signed)
PT Cancellation Note  Patient Details Name: [name] MRN: [id number] DOB: 10-30-1940   Cancelled Treatment:    Reason Eval/Treat Not Completed: Patient at procedure or test/unavailable. Patient is currently off the floor for ultrasound, PT will continue to follow and re-attempt when patient is available.   Kerman Passey, PT, DPT    07/30/2015, 10:02 AM

## 2015-07-30 NOTE — Progress Notes (Signed)
Inpatient Diabetes Program Recommendations  AACE/ADA: New Consensus Statement on Inpatient Glycemic Control (2015)  Target Ranges:  Prepandial:   less than 140 mg/dL      Peak postprandial:   less than 180 mg/dL (1-2 hours)      Critically ill patients:  140 - 180 mg/dL   Review of Glycemic Control:  Results for ALQUAN, PEARY (MRN [id number]) as of 07/30/2015 12:37  Ref. Range 07/29/2015 21:06 07/30/2015 07:31 07/30/2015 08:18 07/30/2015 10:54  Glucose-Capillary Latest Ref Range: 65-99 mg/dL 143 (H) 57 (L) 97 138 (H)    Diabetes history: Type 2 diabetes Outpatient Diabetes medications: Lantus 54 units q HS, Glucotrol 10 mg bid Current orders for Inpatient glycemic control:  Novolog sensitive tid with meals and HS, Lantus 54 units q HS  Inpatient Diabetes Program Recommendations:    Note that fasting blood sugar low.  Consider reduction of Lantus to 35 units q HS due to low blood sugar.  Thanks, Adah Perl, RN, BC-ADM Inpatient Diabetes Coordinator Pager [phone] (8a-5p)

## 2015-07-30 NOTE — Progress Notes (Signed)
[facility] [place] NAME: [name]    MR#:  [id number]  DATE OF BIRTH:  [birth year]  SUBJECTIVE:  CHIEF COMPLAINT:   Chief Complaint  Patient presents with  . Fever   patient is 75 year old Caucasian male with past medical history significant for history of chronic renal failure, diabetes mellitus, essential hypertension who presents to the hospital with complaints of generalized weakness, myalgias, fall, inability to get up from the floor after fall, fever. On arrival to emergency room flu test revealed influenza a positivity and patient was initiated on Tamiflu. The patient feels somewhat better today , a little stronger, denies an nausea, vomiting or diarrhea, he has relatively good appetite  Review of Systems  Constitutional: Positive for malaise/fatigue. Negative for fever, chills and weight loss.  HENT: Negative for congestion.   Eyes: Negative for blurred vision and double vision.  Respiratory: Negative for cough, sputum production, shortness of breath and wheezing.   Cardiovascular: Negative for chest pain, palpitations, orthopnea, leg swelling and PND.  Gastrointestinal: Negative for nausea, vomiting, abdominal pain, diarrhea, constipation and blood in stool.  Genitourinary: Negative for dysuria, urgency, frequency and hematuria.  Musculoskeletal: Negative for falls.  Neurological: Negative for dizziness, tremors, focal weakness and headaches.  Endo/Heme/Allergies: Does not bruise/bleed easily.  Psychiatric/Behavioral: Negative for depression. The patient does not have insomnia.     VITAL SIGNS: Blood pressure 138/57, pulse 76, temperature 99.4 F (37.4 C), temperature source Oral, resp. rate 17, height 6\' 2"  (1.88 m), weight 95.255 kg (210 lb), SpO2 94 %.  PHYSICAL EXAMINATION:   GENERAL:  75 y.o.-year-old patient lying in the bed with no acute distress.  EYES: Pupils equal, round, reactive to light and accommodation.  No scleral icterus. Extraocular muscles intact.  HEENT: Head atraumatic, normocephalic. Oropharynx and nasopharynx clear.  NECK:  Supple, no jugular venous distention. No thyroid enlargement, no tenderness.  LUNGS: Normal breath sounds bilaterally, no wheezing, rales,rhonchi or crepitation. No use of accessory muscles of respiration.  CARDIOVASCULAR: S1, S2 normal. No murmurs, rubs, or gallops.  ABDOMEN: Soft, nontender, nondistended. Bowel sounds present. No organomegaly or mass.  EXTREMITIES: No pedal edema, cyanosis, or clubbing.  NEUROLOGIC: Cranial nerves II through XII are intact. Muscle strength 5/5 in all extremities. Sensation intact. Gait not checked.  PSYCHIATRIC: The patient is alert and oriented x 3.  SKIN: No obvious rash, lesion, or ulcer.   ORDERS/RESULTS REVIEWED:   CBC  Recent Labs Lab 07/29/15 1150  WBC 8.6  HGB 11.7*  HCT 34.1*  PLT 157  MCV 83.3  MCH 28.5  MCHC 34.3  RDW 13.6  LYMPHSABS 0.7*  MONOABS 0.8  EOSABS 0.0  BASOSABS 0.1   ------------------------------------------------------------------------------------------------------------------  Chemistries   Recent Labs Lab 07/29/15 1150 07/30/15 0425  NA 133* 131*  K 4.7 4.2  CL 105 103  CO2 19* 19*  GLUCOSE 161* 90  BUN 38* 40*  CREATININE 2.02* 2.01*  CALCIUM 8.3* 7.9*  AST 29  --   ALT 15*  --   ALKPHOS 50  --   BILITOT 0.8  --    ------------------------------------------------------------------------------------------------------------------ estimated creatinine clearance is 35.8 mL/min (by C-G formula based on Cr of 2.01). ------------------------------------------------------------------------------------------------------------------ No results for input(s): TSH, T4TOTAL, T3FREE, THYROIDAB in the last 72 hours.  Invalid input(s): FREET3  Cardiac Enzymes No results for input(s): CKMB, TROPONINI, MYOGLOBIN in the last 168 hours.  Invalid input(s):  CK ------------------------------------------------------------------------------------------------------------------ Invalid input(s): POCBNP ---------------------------------------------------------------------------------------------------------------  RADIOLOGY: Dg Chest 2 View  07/29/2015  CLINICAL DATA:  75 year old male with fever cough and generalized weakness for 3 days. Currently febrile. Initial encounter. EXAM: CHEST  2 VIEW COMPARISON:  03/10/2005. FINDINGS: Sequelae of CABG again noted. Stable lung volumes with no pneumothorax, pulmonary edema, pleural effusion or consolidation. No acute or confluent pulmonary opacity. Chronic right lateral seventh and eighth rib fractures with mild opacity appear unchanged. Negative visible bowel gas pattern. Osteopenia. No acute osseous abnormality identified. Calcified aortic atherosclerosis. IMPRESSION: No acute cardiopulmonary abnormality. Electronically Signed   By: Genevie Ann M.D.   On: 07/29/2015 12:35   US Renal  07/30/2015  CLINICAL DATA:  Acute renal failure. EXAM: RENAL / URINARY TRACT ULTRASOUND COMPLETE COMPARISON:  None. FINDINGS: Right Kidney: Length: 12.7 cm. Echogenicity within normal limits. No mass or hydronephrosis visualized. There is a 1.4 cm simple appearing cyst in the lower pole of the right kidney. Left Kidney: Length: 12.7 cm. Echogenicity within normal limits. No mass or hydronephrosis visualized. Bladder: Appears normal for degree of bladder distention. Bilateral ureteral jets are seen. IMPRESSION: Normal appearance of the kidneys, apart from 1.4 cm simple appearing right renal cyst. Normal appearance of the urinary bladder with bilateral ureteral jets visualized. Electronically Signed   By: Fidela Salisbury M.D.   On: 07/30/2015 10:04    EKG:  Orders placed or performed during the hospital encounter of 07/29/15  . EKG 12-Lead  . EKG 12-Lead    ASSESSMENT AND PLAN:  Active Problems:   Acute kidney injury ([place])  #1.  Acute on chronic renal failure, no significant improvement with IV fluid administration, continue ultrasound revealed normal appearance of the kidneys, but 1.4 cm right renal cyst, getting postvoid bladder scan, urinalysis #2. Hyponatremia, worsened with IV fluid administration, suspicious for SIADH, getting urinary osmolarity, continue IV fluids and follow sodium level closely #3. Influenza type a, continue isolation and Tamiflu, following fever curve #4. Generalized weakness with fall, supportive therapy, get physical therapist input #5. Diabetes mellitus with hypoglycemia episodes, likely due to poor oral intake, hold insulin Lantus, continue sliding scale insulin for now 6. Mild rhabdomyolysis, continue IV fluids Management plans discussed with the patient, family and they are in agreement.   DRUG ALLERGIES:  Allergies  Allergen Reactions  . Ivp Dye [Iodinated Diagnostic Agents] Other (See Comments)    Reaction:  Unknown   . Codeine Nausea And Vomiting and Rash    CODE STATUS:     Code Status Orders        Start     Ordered   07/29/15 1513  Full code   Continuous     07/29/15 1513    Code Status History    Date Active Date Inactive Code Status Order ID Comments User Context   This patient has a current code status but no historical code status.      TOTAL TIME TAKING CARE OF THIS PATIENT: 40 minutes.    Theodoro Grist M.D on 07/30/2015 at 2:56 PM  Between 7am to 6pm - Pager - [phone]  After 6pm go to [email or web address] - password [email or web address] [facility]  Office  [phone]  CC: Primary care physician; Dicky Doe, MD

## 2015-07-30 NOTE — Progress Notes (Signed)
Blood Glucose 57, patient alert & oriented, gave Orange juice and graham crackers will recheck

## 2015-07-30 NOTE — Progress Notes (Signed)
Attempted to insert foley catheter.  Patient is uncircumcised and patient and nursing staff unable to retract foreskin.  Paged MD to consult urology to come place foley for urinary retention.

## 2015-07-31 ENCOUNTER — Observation Stay: Payer: PPO

## 2015-07-31 DIAGNOSIS — R338 Other retention of urine: Secondary | ICD-10-CM | POA: Diagnosis not present

## 2015-07-31 DIAGNOSIS — R140 Abdominal distension (gaseous): Secondary | ICD-10-CM | POA: Diagnosis not present

## 2015-07-31 DIAGNOSIS — R531 Weakness: Secondary | ICD-10-CM | POA: Diagnosis not present

## 2015-07-31 DIAGNOSIS — R319 Hematuria, unspecified: Secondary | ICD-10-CM | POA: Diagnosis not present

## 2015-07-31 DIAGNOSIS — J111 Influenza due to unidentified influenza virus with other respiratory manifestations: Secondary | ICD-10-CM | POA: Diagnosis not present

## 2015-07-31 DIAGNOSIS — N179 Acute kidney failure, unspecified: Secondary | ICD-10-CM | POA: Diagnosis not present

## 2015-07-31 LAB — URINALYSIS COMPLETE WITH MICROSCOPIC (ARMC ONLY)
BACTERIA UA: NONE SEEN
Bilirubin Urine: NEGATIVE
GLUCOSE, UA: 50 mg/dL — AB
Ketones, ur: NEGATIVE mg/dL
LEUKOCYTES UA: NEGATIVE
NITRITE: NEGATIVE
PROTEIN: 100 mg/dL — AB
SPECIFIC GRAVITY, URINE: 1.012 (ref 1.005–1.030)
Squamous Epithelial / LPF: NONE SEEN
pH: 5 (ref 5.0–8.0)

## 2015-07-31 LAB — BASIC METABOLIC PANEL
Anion gap: 7 (ref 5–15)
BUN: 39 mg/dL — AB (ref 6–20)
CO2: 20 mmol/L — ABNORMAL LOW (ref 22–32)
Calcium: 8 mg/dL — ABNORMAL LOW (ref 8.9–10.3)
Chloride: 105 mmol/L (ref 101–111)
Creatinine, Ser: 1.87 mg/dL — ABNORMAL HIGH (ref 0.61–1.24)
GFR calc Af Amer: 38 mL/min — ABNORMAL LOW (ref >60)
GFR, EST NON AFRICAN AMERICAN: 33 mL/min — AB (ref >60)
GLUCOSE: 162 mg/dL — AB (ref 65–99)
POTASSIUM: 4.6 mmol/L (ref 3.5–5.1)
Sodium: 132 mmol/L — ABNORMAL LOW (ref 135–145)

## 2015-07-31 LAB — GLUCOSE, CAPILLARY
GLUCOSE-CAPILLARY: 75 mg/dL (ref 65–99)
Glucose-Capillary: 144 mg/dL — ABNORMAL HIGH (ref 65–99)
Glucose-Capillary: 146 mg/dL — ABNORMAL HIGH (ref 65–99)

## 2015-07-31 LAB — HEMOGLOBIN A1C: Hgb A1c MFr Bld: 7.1 % — ABNORMAL HIGH (ref 4.0–6.0)

## 2015-07-31 MED ORDER — INSULIN GLARGINE 100 UNIT/ML ~~LOC~~ SOLN
15.0000 [IU] | Freq: Every day | SUBCUTANEOUS | Status: DC
  Administered 2015-07-31: 15 [IU] via SUBCUTANEOUS
  Filled 2015-07-31 (×2): qty 0.15

## 2015-07-31 MED ORDER — ENOXAPARIN SODIUM 40 MG/0.4ML ~~LOC~~ SOLN
40.0000 mg | SUBCUTANEOUS | Status: DC
Start: 2015-07-31 — End: 2015-08-01
  Administered 2015-07-31: 40 mg via SUBCUTANEOUS
  Filled 2015-07-31: qty 0.4

## 2015-07-31 MED ORDER — BUDESONIDE-FORMOTEROL FUMARATE 160-4.5 MCG/ACT IN AERO
2.0000 | INHALATION_SPRAY | Freq: Two times a day (BID) | RESPIRATORY_TRACT | Status: DC
  Administered 2015-07-31 – 2015-08-01 (×2): 2 via RESPIRATORY_TRACT
  Filled 2015-07-31: qty 6

## 2015-07-31 NOTE — Progress Notes (Signed)
Inpatient Diabetes Program Recommendations  AACE/ADA: New Consensus Statement on Inpatient Glycemic Control (2015)  Target Ranges:  Prepandial:   less than 140 mg/dL      Peak postprandial:   less than 180 mg/dL (1-2 hours)      Critically ill patients:  140 - 180 mg/dL   Review of Glycemic Control  Results for [name], [name] (MRN [id number]) as of 07/31/2015 10:47  Ref. Range 07/29/2015 17:23 07/29/2015 21:06 07/30/2015 07:31 07/30/2015 08:18 07/30/2015 10:54 07/30/2015 21:20 07/31/2015 07:59  Glucose-Capillary Latest Ref Range: 65-99 mg/dL 188 (H) 143 (H) 57 (L) 97 138 (H) 119 (H) 75    Diabetes history: Type 2 diabetes Outpatient Diabetes medications: Lantus 54 units q HS, Glucotrol 10 mg bid  Current orders for Inpatient glycemic control: Novolog sensitive tid with meals and HS, Lantus 54 units q HS  Inpatient Diabetes Program Recommendations:    Note that fasting blood sugar low today and yesterday. Lantus insulin was held last night- No Novolog correction yesterday. Please consider either completely stopping Lantus or decreasing dose to 10 units qhs.   Gentry Fitz, RN, BA, MHA, CDE Diabetes Coordinator Inpatient Diabetes Program  [phone] (Team Pager) [phone] ([place]) 07/31/2015 11:00 AM

## 2015-07-31 NOTE — Care Management (Signed)
Met again with patient and shared home health agency list. He declined home health PT.

## 2015-07-31 NOTE — Progress Notes (Signed)
Physical Therapy Treatment Patient Details Name: [name] MRN: [id number] DOB: 1940-09-19 Today's Date: 07/31/2015    History of Present Illness Patient is a 75 y/o male that presents after contracting flu, having non-productive cough, fall, myalgias at home. Found to have AKI.     PT Comments    Pt motivated to work with therapy this date and ambulates in hallway well with improved balance without AD. Pt still slightly shaky and fatigues with endurance. Expected to improve as course of illness resolves. Still requires cga for ambulation at this time. Would benefit from further balance training in OP setting. O2 sats decreased to 88% once resting.   Follow Up Recommendations  Outpatient PT     Equipment Recommendations  None recommended by PT    Recommendations for Other Services       Precautions / Restrictions Precautions Precautions: Fall Restrictions Weight Bearing Restrictions: No    Mobility  Bed Mobility Overal bed mobility: Modified Independent Bed Mobility: Supine to Sit     Supine to sit: Supervision     General bed mobility comments: safe technique performed, no assist required from therapist  Transfers Overall transfer level: Needs assistance Equipment used: None Transfers: Sit to/from Stand Sit to Stand: Min guard         General transfer comment: Safe technique performed without AD required.   Ambulation/Gait Ambulation/Gait assistance: Min guard Ambulation Distance (Feet): 300 Feet Assistive device: None Gait Pattern/deviations: Step-through pattern     General Gait Details: ambulated using reciprocal gait speed and no AD. Pt with slight LOB during L turns, however is able to self correct with cga. Pt fatigues with increased ambulation. Safe technique performed with slow speed.   Stairs            Wheelchair Mobility    Modified Rankin (Stroke Patients Only)       Balance                                     Cognition Arousal/Alertness: Awake/alert Behavior During Therapy: WFL for tasks assessed/performed Overall Cognitive Status: Within Functional Limits for tasks assessed                      Exercises      General Comments        Pertinent Vitals/Pain Pain Assessment: No/denies pain    Home Living                      Prior Function            PT Goals (current goals can now be found in the care plan section) Acute Rehab PT Goals Patient Stated Goal: To return to home mobility.  PT Goal Formulation: With patient Time For Goal Achievement: 08/13/15 Potential to Achieve Goals: Good Progress towards PT goals: Progressing toward goals    Frequency  Min 2X/week    PT Plan Current plan remains appropriate    Co-evaluation             End of Session Equipment Utilized During Treatment: Gait belt Activity Tolerance: Patient tolerated treatment well Patient left: in bed;with bed alarm set     Time: [id number] PT Time Calculation (min) (ACUTE ONLY): 25 min  Charges:  $Gait Training: 23-37 mins  G Codes:      Keeara Frees 07/31/2015, 5:23 PM  Greggory Stallion, PT, DPT [phone]

## 2015-07-31 NOTE — Progress Notes (Deleted)
Spoke with Dr Bobbye Riggs regarding high glucose levels and patient being on D5LR. Ordered to d/c fluids.

## 2015-07-31 NOTE — Progress Notes (Signed)
[place] [facility] [place] NAME: [name]    MR#:  [id number]  DATE OF BIRTH:  12/20/1940  SUBJECTIVE:  CHIEF COMPLAINT:   Chief Complaint  Patient presents with  . Fever   - Fevers today. Feels much better. His cough is noted. -Started on Tamiflu for his influenza. Renal function only slight improvement  REVIEW OF SYSTEMS:  Review of Systems  Constitutional: Negative for fever and chills.  Respiratory: Negative for cough, shortness of breath and wheezing.   Cardiovascular: Negative for chest pain, palpitations and leg swelling.  Gastrointestinal: Negative for nausea, vomiting, abdominal pain, diarrhea and constipation.  Genitourinary: Negative for dysuria and frequency.  Musculoskeletal: Negative for myalgias.  Neurological: Negative for dizziness, sensory change, speech change, focal weakness, seizures and headaches.    DRUG ALLERGIES:   Allergies  Allergen Reactions  . Ivp Dye [Iodinated Diagnostic Agents] Other (See Comments)    Reaction:  Unknown   . Codeine Nausea And Vomiting and Rash    VITALS:  Blood pressure 130/58, pulse 76, temperature 98.7 F (37.1 C), temperature source Oral, resp. rate 18, height 6\' 2"  (1.88 m), weight 95.255 kg (210 lb), SpO2 94 %.  PHYSICAL EXAMINATION:  Physical Exam  GENERAL:  75 y.o.-year-old patient lying in the bed with no acute distress.  EYES: Pupils equal, round, reactive to light and accommodation. No scleral icterus. Extraocular muscles intact.  HEENT: Head atraumatic, normocephalic. Oropharynx and nasopharynx clear.  NECK:  Supple, no jugular venous distention. No thyroid enlargement, no tenderness.  LUNGS: Normal breath sounds bilaterally, no wheezing, rales,rhonchi or crepitation. No use of accessory muscles of respiration. Decreased bibasilar breath sounds. CARDIOVASCULAR: S1, S2 normal. No murmurs, rubs, or gallops.  ABDOMEN: Soft, nontender, nondistended. Bowel sounds  present. No organomegaly or mass.  EXTREMITIES: No pedal edema, cyanosis, or clubbing.  NEUROLOGIC: Cranial nerves II through XII are intact. Muscle strength 5/5 in all extremities. Sensation intact. Gait not checked.  PSYCHIATRIC: The patient is alert and oriented x 3.  SKIN: No obvious rash, lesion, or ulcer.    LABORATORY PANEL:   CBC  Recent Labs Lab 07/29/15 1150  WBC 8.6  HGB 11.7*  HCT 34.1*  PLT 157   ------------------------------------------------------------------------------------------------------------------  Chemistries   Recent Labs Lab 07/29/15 1150  07/31/15 1111  NA 133*  < > 132*  K 4.7  < > 4.6  CL 105  < > 105  CO2 19*  < > 20*  GLUCOSE 161*  < > 162*  BUN 38*  < > 39*  CREATININE 2.02*  < > 1.87*  CALCIUM 8.3*  < > 8.0*  AST 29  --   --   ALT 15*  --   --   ALKPHOS 50  --   --   BILITOT 0.8  --   --   < > = values in this interval not displayed. ------------------------------------------------------------------------------------------------------------------  Cardiac Enzymes No results for input(s): TROPONINI in the last 168 hours. ------------------------------------------------------------------------------------------------------------------  RADIOLOGY:  US Renal  07/30/2015  CLINICAL DATA:  Acute renal failure. EXAM: RENAL / URINARY TRACT ULTRASOUND COMPLETE COMPARISON:  None. FINDINGS: Right Kidney: Length: 12.7 cm. Echogenicity within normal limits. No mass or hydronephrosis visualized. There is a 1.4 cm simple appearing cyst in the lower pole of the right kidney. Left Kidney: Length: 12.7 cm. Echogenicity within normal limits. No mass or hydronephrosis visualized. Bladder: Appears normal for degree of bladder distention. Bilateral ureteral jets are seen. IMPRESSION: Normal appearance  of the kidneys, apart from 1.4 cm simple appearing right renal cyst. Normal appearance of the urinary bladder with bilateral ureteral jets visualized.  Electronically Signed   By: Fidela Salisbury M.D.   On: 07/30/2015 10:04    EKG:   Orders placed or performed during the hospital encounter of 07/29/15  . EKG 12-Lead  . EKG 12-Lead    ASSESSMENT AND PLAN:   75 year old male with past medical history significant for insulin-dependent diabetes mellitus, hypertension, GERD and coronary artery disease presents to the hospital secondary to myalgias and cough and noted to be in acute renal failure.  #1 acute renal failure on chronic kidney disease-creatinine improving with fluids. -Renal ultrasound with normal kidneys and there's a 1.4 cm right renal cyst. -Urinary retention noted, started on Flomax. Appreciate urology consult -Continue Foley catheter for 1 week in outpatient voiding trial recommended. -Avoid nephrotoxins -Follow BMP tomorrow -Phimosis/ Balanoposthitis- treatment with topical therapy  #2 influenza A-continue respiratory isolation and also on Tamiflu. -Renally dose adjusted Tamiflu. Fevers are improving. Continue physical therapy.  #3 diabetes mellitus-on insulin. Continue Lantus- decrease the dose as hypo-glycemic episodes noted. and also sliding scale insulin.  #4 hypertension-on Norvasc and lisinopril  #5 GERD-on Protonix  #6 DVT prophylaxis-on subcutaneous heparin   Physical therapy consult pending.  All the records are reviewed and case discussed with Care Management/Social Workerr. Management plans discussed with the patient, family and they are in agreement.  CODE STATUS: Full Code  TOTAL TIME TAKING CARE OF THIS PATIENT: 38 minutes.   POSSIBLE D/C IN 1-2 DAYS, DEPENDING ON CLINICAL CONDITION.   Kalynne Womac M.D on 07/31/2015 at 3:29 PM  Between 7am to 6pm - Pager - [phone]  After 6pm go to [email or web address] - password [email or web address] [facility]  Office  [phone]  CC: Primary care physician; Dicky Doe, MD

## 2015-08-01 ENCOUNTER — Telehealth: Payer: Self-pay | Admitting: Family Medicine

## 2015-08-01 DIAGNOSIS — N179 Acute kidney failure, unspecified: Secondary | ICD-10-CM | POA: Diagnosis not present

## 2015-08-01 DIAGNOSIS — J111 Influenza due to unidentified influenza virus with other respiratory manifestations: Secondary | ICD-10-CM | POA: Diagnosis not present

## 2015-08-01 DIAGNOSIS — R338 Other retention of urine: Secondary | ICD-10-CM | POA: Diagnosis not present

## 2015-08-01 DIAGNOSIS — R319 Hematuria, unspecified: Secondary | ICD-10-CM | POA: Diagnosis not present

## 2015-08-01 DIAGNOSIS — R531 Weakness: Secondary | ICD-10-CM | POA: Diagnosis not present

## 2015-08-01 LAB — CBC WITH DIFFERENTIAL/PLATELET
BASOS ABS: 0 10*3/uL (ref 0–0.1)
Basophils Relative: 1 %
Eosinophils Absolute: 0.2 10*3/uL (ref 0–0.7)
Eosinophils Relative: 4 %
HEMATOCRIT: 31 % — AB (ref 40.0–52.0)
Hemoglobin: 10.5 g/dL — ABNORMAL LOW (ref 13.0–18.0)
LYMPHS PCT: 27 %
Lymphs Abs: 1.4 10*3/uL (ref 1.0–3.6)
MCH: 27.7 pg (ref 26.0–34.0)
MCHC: 33.9 g/dL (ref 32.0–36.0)
MCV: 81.7 fL (ref 80.0–100.0)
MONO ABS: 0.6 10*3/uL (ref 0.2–1.0)
Monocytes Relative: 11 %
NEUTROS ABS: 2.9 10*3/uL (ref 1.4–6.5)
Neutrophils Relative %: 57 %
Platelets: 145 10*3/uL — ABNORMAL LOW (ref 150–440)
RBC: 3.8 MIL/uL — AB (ref 4.40–5.90)
RDW: 13.9 % (ref 11.5–14.5)
WBC: 5.1 10*3/uL (ref 3.8–10.6)

## 2015-08-01 LAB — BASIC METABOLIC PANEL
Anion gap: 8 (ref 5–15)
BUN: 38 mg/dL — ABNORMAL HIGH (ref 6–20)
CHLORIDE: 106 mmol/L (ref 101–111)
CO2: 18 mmol/L — AB (ref 22–32)
CREATININE: 1.74 mg/dL — AB (ref 0.61–1.24)
Calcium: 7.9 mg/dL — ABNORMAL LOW (ref 8.9–10.3)
GFR calc non Af Amer: 36 mL/min — ABNORMAL LOW (ref >60)
GFR, EST AFRICAN AMERICAN: 42 mL/min — AB (ref >60)
Glucose, Bld: 181 mg/dL — ABNORMAL HIGH (ref 65–99)
Potassium: 4.6 mmol/L (ref 3.5–5.1)
Sodium: 132 mmol/L — ABNORMAL LOW (ref 135–145)

## 2015-08-01 LAB — URINE CULTURE: Culture: NO GROWTH

## 2015-08-01 LAB — GLUCOSE, CAPILLARY
Glucose-Capillary: 155 mg/dL — ABNORMAL HIGH (ref 65–99)
Glucose-Capillary: 230 mg/dL — ABNORMAL HIGH (ref 65–99)
Glucose-Capillary: 180 mg/dL — ABNORMAL HIGH (ref 65–99)

## 2015-08-01 MED ORDER — POLYETHYLENE GLYCOL 3350 17 G PO PACK
17.0000 g | PACK | Freq: Once | ORAL | Status: AC
  Administered 2015-08-01: 17 g via ORAL
  Filled 2015-08-01: qty 1

## 2015-08-01 MED ORDER — SENNOSIDES-DOCUSATE SODIUM 8.6-50 MG PO TABS
2.0000 | ORAL_TABLET | Freq: Two times a day (BID) | ORAL | Status: DC
  Administered 2015-08-01: 2 via ORAL
  Filled 2015-08-01: qty 2

## 2015-08-01 MED ORDER — CIPROFLOXACIN HCL 500 MG PO TABS
500.0000 mg | ORAL_TABLET | Freq: Two times a day (BID) | ORAL | Status: DC
  Administered 2015-08-01: 500 mg via ORAL
  Filled 2015-08-01: qty 1

## 2015-08-01 MED ORDER — INSULIN GLARGINE 100 UNIT/ML ~~LOC~~ SOLN: 20.0000 [IU] | Freq: Every day | SUBCUTANEOUS | Status: DC

## 2015-08-01 MED ORDER — TAMSULOSIN HCL 0.4 MG PO CAPS: 0.4000 mg | ORAL_CAPSULE | Freq: Every day | ORAL | Status: AC

## 2015-08-01 MED ORDER — METRONIDAZOLE 500 MG PO TABS
500.0000 mg | ORAL_TABLET | Freq: Three times a day (TID) | ORAL | Status: DC
  Administered 2015-08-01 (×2): 500 mg via ORAL
  Filled 2015-08-01 (×2): qty 1

## 2015-08-01 MED ORDER — CIPROFLOXACIN IN D5W 400 MG/200ML IV SOLN
400.0000 mg | Freq: Two times a day (BID) | INTRAVENOUS | Status: DC
  Filled 2015-08-01: qty 200

## 2015-08-01 MED ORDER — NYSTATIN-TRIAMCINOLONE 100000-0.1 UNIT/GM-% EX OINT: TOPICAL_OINTMENT | Freq: Two times a day (BID) | CUTANEOUS | Status: DC

## 2015-08-01 MED ORDER — GUAIFENESIN 100 MG/5ML PO SOLN: 10.0000 mL | ORAL | Status: DC | PRN

## 2015-08-01 MED ORDER — POLYETHYLENE GLYCOL 3350 17 G PO PACK
17.0000 g | PACK | Freq: Every day | ORAL | Status: DC | PRN
Start: 2015-08-01 — End: 2015-08-06

## 2015-08-01 MED ORDER — METRONIDAZOLE 500 MG PO TABS: 500.0000 mg | ORAL_TABLET | Freq: Three times a day (TID) | ORAL | Status: DC

## 2015-08-01 NOTE — Progress Notes (Signed)
Patient complaining of discomfort in abdomen, states he feels that he has been getting more and more bloated. Abdomen taut and distended. Patient states he had a large BM on the 8th. No nausea. Dr Darvin Neighbours, MD on-call, notified. Ordered x-ray of abdomen and instructed nursing to administer oxycodone for discomfort. Nursing continues to monitor.

## 2015-08-01 NOTE — Telephone Encounter (Signed)
As per [name] has appointment and his insurance changed from [facility] to [facility] and they don't need any referral [name] had called [name] is still in hospital and we don't have any new insurance card since he hasn't been in the office.

## 2015-08-01 NOTE — Telephone Encounter (Signed)
Pt is being discharged from [facility] and has appt with Dr. Hollice Espy at [facility] on the 15th for voiding trial.  He needs a referral before the appt.  B's telephone number is [phone] if you have any questions

## 2015-08-01 NOTE — Progress Notes (Signed)
Pharmacy Antibiotic Note  [name] is a 76 y.o. male admitted on 07/29/2015 with intra-abdominal infection.  Pharmacy has been consulted for ciprofloxacin dosing.  Plan: Will order ciprofloxacin 500 mg po BID based on renal function.   Patient also receiving Metronidazole 500 mg po q8h.  Height: 6\' 2"  (188 cm) Weight: 210 lb (95.255 kg) IBW/kg (Calculated) : 82.2  Temp (24hrs), Avg:98.6 F (37 C), Min:98.1 F (36.7 C), Max:99.2 F (37.3 C)   Recent Labs Lab 07/29/15 1150 07/30/15 0425 07/31/15 1111 08/01/15 0447  WBC 8.6  --   --   --   CREATININE 2.02* 2.01* 1.87* 1.74*  LATICACIDVEN 1.0  --   --   --     Estimated Creatinine Clearance: 41.3 mL/min (by C-G formula based on Cr of 1.74).    Allergies  Allergen Reactions  . Ivp Dye [Iodinated Diagnostic Agents] Other (See Comments)    Reaction:  Unknown   . Codeine Nausea And Vomiting and Rash    Antimicrobials this admission: Anti-infectives    Start     Dose/Rate Route Frequency Ordered Stop   08/01/15 0800  ciprofloxacin (CIPRO) IVPB 400 mg  Status:  Discontinued     400 mg 200 mL/hr over 60 Minutes Intravenous Every 12 hours 08/01/15 0744 08/01/15 0745   08/01/15 0800  ciprofloxacin (CIPRO) tablet 500 mg     500 mg Oral 2 times daily 08/01/15 0745     08/01/15 0715  metroNIDAZOLE (FLAGYL) tablet 500 mg     500 mg Oral 3 times per day 08/01/15 0709     07/29/15 2200  oseltamivir (TAMIFLU) capsule 30 mg     30 mg Oral 2 times daily 07/29/15 1700 08/03/15 0959   07/29/15 1515  oseltamivir (TAMIFLU) capsule 75 mg  Status:  Discontinued     75 mg Oral 2 times daily 07/29/15 1507 07/29/15 1700      Dose adjustments this admission: Continue to follow renal function for possible adjustment.  Microbiology results: Results for orders placed or performed during the hospital encounter of 07/29/15  Culture, blood (routine x 2)     Status: None (Preliminary result)   Collection Time: 07/29/15 11:20 AM  Result  Value Ref Range Status   Specimen Description BLOOD LEFT ASSIST CONTROL  Final   Special Requests BOTTLES DRAWN AEROBIC AND ANAEROBIC 1CC  Final   Culture NO GROWTH 3 DAYS  Final   Report Status PENDING  Incomplete  Culture, blood (routine x 2)     Status: None (Preliminary result)   Collection Time: 07/29/15 11:50 AM  Result Value Ref Range Status   Specimen Description BLOOD LEFT ARM  Final   Special Requests   Final    BOTTLES DRAWN AEROBIC AND ANAEROBIC AERO 3CC ANA [place]   Culture NO GROWTH 3 DAYS  Final   Report Status PENDING  Incomplete  Urine culture     Status: None (Preliminary result)   Collection Time: 07/29/15  5:50 PM  Result Value Ref Range Status   Specimen Description URINE, RANDOM  Final   Special Requests NONE  Final   Culture NO GROWTH < 12 HOURS  Final   Report Status PENDING  Incomplete   Thank you for allowing pharmacy to be a part of this patient's care.  Gareth Fitzner G 08/01/2015 7:45 AM

## 2015-08-01 NOTE — Progress Notes (Signed)
Pt taught how to care for a foley catheter before discharge. He demonstrated how to empty bag and care for tubing. I also gave him step by step instructions on how to take care foley, change bag, and when to call doctor. I highlighted important points to remember. I also did the same teaching minus the demonstration to his stepdaughter.

## 2015-08-01 NOTE — Discharge Summary (Signed)
[place] [facility] [place] NAME: [name]    MR#:  [id number]  DATE OF BIRTH:  1940-12-18  DATE OF ADMISSION:  07/29/2015 ADMITTING PHYSICIAN: Lytle Butte, MD  DATE OF DISCHARGE: 08/01/2015  PRIMARY CARE PHYSICIAN: Dicky Doe, MD    ADMISSION DIAGNOSIS:  Generalized weakness [R53.1] Influenza [J11.1]  DISCHARGE DIAGNOSIS:  Active Problems:   Acute kidney injury [facility])   Urinary retention   Phimosis   Balanoposthitis   SECONDARY DIAGNOSIS:   Past Medical History  Diagnosis Date  . Carcinoma of skin   . Diabetic retinopathy ([place])   . Diabetes mellitus without complication ([place])   . GERD (gastroesophageal reflux disease)   . Coronary artery disease 2005    CABG X 7 in 2005  . Hypertension   . Cauda equina compression [facility])     HOSPITAL COURSE:   75 year old male with past medical history significant for insulin-dependent diabetes mellitus, hypertension, GERD and coronary artery disease presents to the hospital secondary to myalgias and cough and noted to be in acute renal failure.  #1 Acute renal failure on chronic kidney disease-creatinine improving with fluids. -Renal ultrasound with normal kidneys and there's a 1.4 cm right renal cyst. -Urinary retention noted, started on Flomax. Appreciate urology consult -Continue Foley catheter for 1 week in outpatient voiding trial recommended. -Avoid nephrotoxins -baseline creatinine at baseline around 1.3 from 2015, now at 1.7 -Phimosis/ Balanoposthitis- treatment with topical therapy  #2 influenza A-continue respiratory isolation and also on Tamiflu. -Renally dose adjusted Tamiflu. No further fevers, feels much stronger.  #3 diabetes mellitus- discontinued glipizide. Also lantus dose reduced at discharge as sugars haven't been that high here Follow up with PCP  #4 hypertension-on Norvasc and lisinopril  #5 GERD-on Protonix  #6 Hematuria- noted in foley today,  advised to flush catheter and it was clearing Hb stable since admission Asa and lovenox discontinued so hopefully will resolve Follow up with urology next week  #7 Abdominal pain- possible colitis- started on flagyl No diarrhea, constipated actually. Stool softeners added Pain improved  Worked well with Physical therapy. No PT needs at discharge.  DISCHARGE CONDITIONS:   Stable  CONSULTS OBTAINED:  Treatment Team:  Lytle Butte, MD Hollice Espy, MD  DRUG ALLERGIES:   Allergies  Allergen Reactions  . Ivp Dye [Iodinated Diagnostic Agents] Other (See Comments)    Reaction:  Unknown   . Codeine Nausea And Vomiting and Rash    DISCHARGE MEDICATIONS:   Current Discharge Medication List    START taking these medications   Details  guaiFENesin (ROBITUSSIN) 100 MG/5ML SOLN Take 10 mLs (200 mg total) by mouth every 4 (four) hours as needed for cough or to loosen phlegm. Qty: 1200 mL, Refills: 0    metroNIDAZOLE (FLAGYL) 500 MG tablet Take 1 tablet (500 mg total) by mouth every 8 (eight) hours. X 7 days Qty: 21 tablet, Refills: 0    nystatin-triamcinolone ointment (MYCOLOG) Apply topically 2 (two) times daily. Apply to foreskin of penis Qty: 30 g, Refills: 0    polyethylene glycol (MIRALAX / GLYCOLAX) packet Take 17 g by mouth daily as needed for moderate constipation. Qty: 14 each, Refills: 2    tamsulosin (FLOMAX) 0.4 MG CAPS capsule Take 1 capsule (0.4 mg total) by mouth daily. Qty: 30 capsule, Refills: 3      CONTINUE these medications which have CHANGED   Details  insulin glargine (LANTUS) 100 UNIT/ML injection Inject 0.2 mLs (20 Units  total) into the skin at bedtime. Qty: 10 mL, Refills: 12   Associated Diagnoses: Type 2 diabetes mellitus without complication ([place])      CONTINUE these medications which have NOT CHANGED   Details  acetaminophen (TYLENOL) 500 MG tablet Take 1,000 mg by mouth every 6 (six) hours as needed for mild pain, fever or headache.     amitriptyline (ELAVIL) 10 MG tablet Take 2 tablets (20 mg total) by mouth at bedtime. 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    Cholecalciferol (VITAMIN D3) 400 units CAPS Take 400 Units by mouth 2 (two) times daily.    lisinopril (PRINIVIL,ZESTRIL) 20 MG tablet Take 20 mg by mouth daily.    omeprazole (PRILOSEC) 20 MG capsule Take 20 mg by mouth daily.     simvastatin (ZOCOR) 10 MG tablet Take 1 tablet (10 mg total) by mouth daily at 6 PM. Qty: 90 tablet, Refills: 3   Associated Diagnoses: Coronary artery disease involving native coronary artery of native heart without angina pectoris      STOP taking these medications     aspirin EC 81 MG tablet      glipiZIDE (GLUCOTROL) 5 MG tablet          DISCHARGE INSTRUCTIONS:   1. PCP f/u in 2 weeks 2. Urology f/u in 1 week for foley catheter removal  If you experience worsening of your admission symptoms, develop shortness of breath, life threatening emergency, suicidal or homicidal thoughts you must seek medical attention immediately by calling 911 or calling your MD immediately  if symptoms less severe.  You Must read complete instructions/literature along with all the possible adverse reactions/side effects for all the Medicines you take and that have been prescribed to you. Take any new Medicines after you have completely understood and accept all the possible adverse reactions/side effects.   Please note  You were cared for by a hospitalist during your hospital stay. If you have any questions about your discharge medications or the care you received while you were in the hospital after you are discharged, you can call the unit and asked to speak with the  hospitalist on call if the hospitalist that took care of you is not available. Once you are discharged, your primary care physician will handle any further medical issues. Please note that NO REFILLS for any discharge medications will be authorized once you are discharged, as it is imperative that you return to your primary care physician (or establish a relationship with a primary care physician if you do not have one) for your aftercare needs so that they can reassess your need for medications and monitor your lab values.    Today   CHIEF COMPLAINT:   Chief Complaint  Patient presents with  . Fever    VITAL SIGNS:  Blood pressure 147/55, pulse 67, temperature 97.7 F (36.5 C), temperature source Oral, resp. rate 18, height 6\' 2"  (1.88 m), weight 95.255 kg (210 lb), SpO2 96 %.  I/O:   Intake/Output Summary (Last 24 hours) at 08/01/15 1553 Last data filed at 08/01/15 1400  Gross per 24 hour  Intake 1416.25 ml  Output   2700 ml  Net -1283.75 ml    PHYSICAL EXAMINATION:   Physical Exam  GENERAL: 75 y.o.-year-old patient  lying in the bed with no acute distress.  EYES: Pupils equal, round, reactive to light and accommodation. No scleral icterus. Extraocular muscles intact.  HEENT: Head atraumatic, normocephalic. Oropharynx and nasopharynx clear.  NECK: Supple, no jugular venous distention. No thyroid enlargement, no tenderness.  LUNGS: Normal breath sounds bilaterally, no wheezing, rales,rhonchi or crepitation. No use of accessory muscles of respiration. Decreased bibasilar breath sounds. CARDIOVASCULAR: S1, S2 normal. No murmurs, rubs, or gallops.  ABDOMEN: Soft, nontender, nondistended. Bowel sounds present. No organomegaly or mass.  EXTREMITIES: No pedal edema, cyanosis, or clubbing.  NEUROLOGIC: Cranial nerves II through XII are intact. Muscle strength 5/5 in all extremities. Sensation intact. Gait not checked.  PSYCHIATRIC: The patient is alert and oriented x 3.   SKIN: No obvious rash, lesion, or ulcer.   DATA REVIEW:   CBC  Recent Labs Lab 08/01/15 0447  WBC 5.1  HGB 10.5*  HCT 31.0*  PLT 145*    Chemistries   Recent Labs Lab 07/29/15 1150  08/01/15 0447  NA 133*  < > 132*  K 4.7  < > 4.6  CL 105  < > 106  CO2 19*  < > 18*  GLUCOSE 161*  < > 181*  BUN 38*  < > 38*  CREATININE 2.02*  < > 1.74*  CALCIUM 8.3*  < > 7.9*  AST 29  --   --   ALT 15*  --   --   ALKPHOS 50  --   --   BILITOT 0.8  --   --   < > = values in this interval not displayed.  Cardiac Enzymes No results for input(s): TROPONINI in the last 168 hours.  Microbiology Results  Results for orders placed or performed during the hospital encounter of 07/29/15  Culture, blood (routine x 2)     Status: None (Preliminary result)   Collection Time: 07/29/15 11:20 AM  Result Value Ref Range Status   Specimen Description BLOOD LEFT ASSIST CONTROL  Final   Special Requests BOTTLES DRAWN AEROBIC AND ANAEROBIC 1CC  Final   Culture NO GROWTH 3 DAYS  Final   Report Status PENDING  Incomplete  Culture, blood (routine x 2)     Status: None (Preliminary result)   Collection Time: 07/29/15 11:50 AM  Result Value Ref Range Status   Specimen Description BLOOD LEFT ARM  Final   Special Requests   Final    BOTTLES DRAWN AEROBIC AND ANAEROBIC AERO 3CC ANA [place]   Culture NO GROWTH 3 DAYS  Final   Report Status PENDING  Incomplete  Urine culture     Status: None   Collection Time: 07/29/15  5:50 PM  Result Value Ref Range Status   Specimen Description URINE, RANDOM  Final   Special Requests NONE  Final   Culture INSIGNIFICANT GROWTH  Final   Report Status 08/01/2015 FINAL  Final  Urine culture     Status: None   Collection Time: 07/31/15  4:18 AM  Result Value Ref Range Status   Specimen Description URINE, CATHETERIZED  Final   Special Requests NONE  Final   Culture NO GROWTH 1 DAY  Final   Report Status 08/01/2015 FINAL  Final    RADIOLOGY:  Dg Abd 1  View  07/31/2015  CLINICAL DATA:  75 year old male with abdominal distension EXAM: ABDOMEN - 1 VIEW COMPARISON:  None. FINDINGS: Mild air distention of the stomach. Air is noted throughout the small bowel. There is an Personal assistant with surrounding area  of hazy density in the left lower abdomen to the left of the lower lumbar spine concerning for an inflamed loop of bowel. There is apparent mass effect and displacement of the adjacent bowel loops laterally. CT is recommended for further evaluation. Moderate stool noted throughout the colon. No free air. No radiopaque calculi. There is degenerative changes of the spine and hip joints. Age indeterminate fracture of the left superior pubic ramus. Clinical correlation is recommended. Median sternotomy wires noted. IMPRESSION: Thickened appearing tubular structure in the left lower abdomen concerning for an inflamed loop of bowel. CT is recommended for further evaluation. Age indeterminate fracture of left superior pubic ramus. Clinical correlation is recommended. Electronically Signed   By: Anner Crete M.D.   On: 07/31/2015 23:15    EKG:   Orders placed or performed during the hospital encounter of 07/29/15  . EKG 12-Lead  . EKG 12-Lead      Management plans discussed with the patient, family and they are in agreement.  CODE STATUS:     Code Status Orders        Start     Ordered   07/29/15 1513  Full code   Continuous     07/29/15 1513    Code Status History    Date Active Date Inactive Code Status Order ID Comments User Context   This patient has a current code status but no historical code status.      TOTAL TIME TAKING CARE OF THIS PATIENT: 38 minutes.    Linde Wilensky M.D on 08/01/2015 at 3:53 PM  Between 7am to 6pm - Pager - [phone]  After 6pm go to [email or web address] - password [email or web address] [facility]  Office  [phone]  CC: Primary care physician; Dicky Doe, MD

## 2015-08-01 NOTE — Progress Notes (Signed)
irrigated patients foley with 240 ML of sterile water. Fluid returned was pink tinged with 3 very small clots. Pt tolerated well.

## 2015-08-01 NOTE — Discharge Instructions (Signed)
Please stop the amytriptiline until seen by PCP

## 2015-08-03 LAB — CULTURE, BLOOD (ROUTINE X 2)
Culture: NO GROWTH
Culture: NO GROWTH

## 2015-08-04 ENCOUNTER — Encounter: Payer: Self-pay | Admitting: *Deleted

## 2015-08-06 ENCOUNTER — Encounter: Payer: Self-pay | Admitting: Obstetrics and Gynecology

## 2015-08-06 ENCOUNTER — Telehealth: Payer: Self-pay | Admitting: Family Medicine

## 2015-08-06 ENCOUNTER — Ambulatory Visit (INDEPENDENT_AMBULATORY_CARE_PROVIDER_SITE_OTHER): Payer: PPO | Admitting: Obstetrics and Gynecology

## 2015-08-06 VITALS — BP 164/72 | HR 89 | Resp 16 | Ht 74.5 in | Wt 219.7 lb

## 2015-08-06 DIAGNOSIS — R339 Retention of urine, unspecified: Secondary | ICD-10-CM

## 2015-08-06 LAB — URINALYSIS, COMPLETE
Bilirubin, UA: NEGATIVE
Nitrite, UA: NEGATIVE
PH UA: 5.5 (ref 5.0–7.5)
SPEC GRAV UA: 1.025 (ref 1.005–1.030)
Urobilinogen, Ur: 1 mg/dL (ref 0.2–1.0)

## 2015-08-06 LAB — MICROSCOPIC EXAMINATION
BACTERIA UA: NONE SEEN
EPITHELIAL CELLS (NON RENAL): NONE SEEN /HPF (ref 0–10)
RBC, UA: >30 /hpf — AB (ref 0–?)

## 2015-08-06 MED ORDER — CEFUROXIME AXETIL 250 MG PO TABS: 250.0000 mg | ORAL_TABLET | Freq: Two times a day (BID) | ORAL | Status: DC

## 2015-08-06 NOTE — Progress Notes (Signed)
08/06/2015 10:19 AM   [name] [birth year] TX:[id number]  Referring provider: [name], MD [place], [place] [address]  Chief Complaint  Patient presents with  . Urinary Retention  . Establish Care    HPI: [name] is a 75 y.o. year old male who admitted to the hospital with influenza A 07/30/15. He initially developed generalized weakness, coughing, myalgias and presented to his PCP who sent him to the emergency room for further care. In the emergency room, he was found to have acute kidney injury with a creatinine elevated 2 which at that time was thought to be prerenal.   He had some difficulty voiding and was found to have greater than a liter in his bladder on bladder scan. He also has a history of phimosis and has developed some redness/irritation of his foreskin along with redness in the scrotum over the past few days. Nursing attempted to place a Foley but had difficulty secondary to phimosis therefore urology was asked to assist.  Mr. Dinkel does have a history of urinary retention requiring a Foley catheter during the length of his hospitalization for back surgery several years ago at [facility]. Otherwise commonly he denies any history of weak stream, difficulty voiding. He does get up 2 or 3 times at night to urinate. Incidentally, over the past month or so, he has started wearing depends at night due to urinary leakage at night which is a new symptom. He does not have a urologist and was not taking any medications for his prostate. No history of urinary tract infections or gross hematuria. Patient was started on Flomax during admission.  Creatinine improved after catheter placement and was 1.74 upon discharge.  Renal ultrasound 07/30/2015 shows no evidence of hydronephrosis bilaterally.  He presents today for outpatient follow-up and for voiding trial. He reports continued edema of his foreskin. He states that he stepped on his catheter tubing a few nights ago  causing him some pain. Gross hematuria noted in patient's Foley drainage bag which he states has been occurring for a few days. He is unsure if it began after he stepped on the Foley tubing. He reports continued cough. Denies any fevers. He is not experiencing any flank pain. He is very anxious about continued Foley placement. He was not provided a leg bag upon discharge from the hospital.   PMH: Past Medical History  Diagnosis Date  . Carcinoma of skin   . Diabetic retinopathy ([place])   . Diabetes mellitus without complication ([place])   . GERD (gastroesophageal reflux disease)   . Coronary artery disease 2005    CABG X 7 in 2005  . Hypertension   . Cauda equina compression [facility])     Surgical History: Past Surgical History  Procedure Laterality Date  . Cardiac catheterization  2005  . Coronary artery bypass graft  2005     CABG x 7 at [facility]   . Cataract extraction  2014    right eye   . Knee surgery      left knee  . Back surgery  07/2012    Home Medications:    Medication List       This list is accurate as of: 08/06/15 10:19 AM.  Always use your most recent med list.               ACCU-CHEK SOFTCLIX LANCETS lancets     acetaminophen 500 MG tablet  Commonly known as:  TYLENOL  Take  1,000 mg by mouth every 6 (six) hours as needed for mild pain, fever or headache.     amitriptyline 10 MG tablet  Commonly known as:  ELAVIL  Take 2 tablets (20 mg total) by mouth at bedtime.     amLODipine 10 MG tablet  Commonly known as:  NORVASC  Take 10 mg by mouth daily.     glipiZIDE 5 MG tablet  Commonly known as:  GLUCOTROL     guaiFENesin 100 MG/5ML Soln  Commonly known as:  ROBITUSSIN  Take 10 mLs (200 mg total) by mouth every 4 (four) hours as needed for cough or to loosen phlegm.     insulin glargine 100 UNIT/ML injection  Commonly known as:  LANTUS  Inject 0.2 mLs (20 Units total) into the skin at bedtime.     lisinopril 20 MG tablet  Commonly known as:   PRINIVIL,ZESTRIL  Take 20 mg by mouth daily.     metroNIDAZOLE 500 MG tablet  Commonly known as:  FLAGYL  Take 1 tablet (500 mg total) by mouth every 8 (eight) hours. X 7 days     omeprazole 20 MG capsule  Commonly known as:  PRILOSEC  Take 20 mg by mouth daily.     simvastatin 10 MG tablet  Commonly known as:  ZOCOR  Take 1 tablet (10 mg total) by mouth daily at 6 PM.     tamsulosin 0.4 MG Caps capsule  Commonly known as:  FLOMAX  Take 1 capsule (0.4 mg total) by mouth daily.     Vitamin D3 400 units Caps  Take 400 Units by mouth 2 (two) times daily.        Allergies:  Allergies  Allergen Reactions  . Ivp Dye [Iodinated Diagnostic Agents] Other (See Comments)    Reaction:  Unknown   . Codeine Nausea And Vomiting and Rash    Family History: Family History  Problem Relation Age of Onset  . Hypertension Sister   . Hyperlipidemia Sister     Social History:  reports that he has never smoked. He has never used smokeless tobacco. He reports that he drinks alcohol. He reports that he does not use illicit drugs.  ROS: UROLOGY Frequent Urination?: No Hard to postpone urination?: No Burning/pain with urination?: No Get up at night to urinate?: No Leakage of urine?: No Urine stream starts and stops?: No Trouble starting stream?: No Do you have to strain to urinate?: No Blood in urine?: Yes Urinary tract infection?: No Sexually transmitted disease?: No Injury to kidneys or bladder?: No Painful intercourse?: No Weak stream?: No Erection problems?: No Penile pain?: No  Gastrointestinal Nausea?: No Vomiting?: No Indigestion/heartburn?: No Diarrhea?: No Constipation?: Yes  Constitutional Fever: No Night sweats?: No Weight loss?: No Fatigue?: Yes  Skin Skin rash/lesions?: No Itching?: No  Eyes Blurred vision?: No Double vision?: No  Ears/Nose/Throat Sore throat?: No Sinus problems?: No  Hematologic/Lymphatic Swollen glands?: No Easy bruising?:  Yes  Cardiovascular Leg swelling?: Yes Chest pain?: No  Respiratory Cough?: Yes Shortness of breath?: Yes  Endocrine Excessive thirst?: Yes  Musculoskeletal Back pain?: No Joint pain?: No  Neurological Headaches?: No Dizziness?: No  Psychologic Depression?: No Anxiety?: No  Physical Exam: BP 164/72 mmHg  Pulse 89  Resp 16  Ht 6' 2.5" (1.892 m)  Wt 219 lb 11.2 oz (99.655 kg)  BMI 27.84 kg/m2  Constitutional:  Alert and oriented, No acute distress. HEENT: [place] AT, moist mucus membranes.  Trachea midline, no masses. Cardiovascular: No clubbing,  cyanosis,  +2 bilateral edema BLEs Respiratory: Normal respiratory effort, no increased work of breathing. GI: Abdomen is soft, nontender, nondistended, no abdominal masses GU: No CVA tenderness. Erythematous, mildly edematous fairly phimotic foreskin, unable to retract. Glands palpable within foreskin. Scrotum with diffusely mild erythema without fluctuance/ tenderness/ edema. Testicles normal, nontender without masses. DRE: deferred until f/u visit- patient very uncomfortable today Skin: No rashes, bruises or suspicious lesions. Lymph: No cervical or inguinal adenopathy. Neurologic: Grossly intact, no focal deficits, moving all 4 extremities. Psychiatric: Normal mood and affect.  Laboratory Data:  Lab Results  Component Value Date   WBC 5.1 08/01/2015   HGB 10.5* 08/01/2015   HCT 31.0* 08/01/2015   MCV 81.7 08/01/2015   PLT 145* 08/01/2015    Lab Results  Component Value Date   CREATININE 1.74* 08/01/2015    No results found for: PSA  No results found for: TESTOSTERONE  Lab Results  Component Value Date   HGBA1C 7.1* 07/30/2015    Urinalysis    Component Value Date/Time   COLORURINE YELLOW* 07/31/2015 0418   APPEARANCEUR CLEAR* 07/31/2015 0418   LABSPEC 1.012 07/31/2015 0418   PHURINE 5.0 07/31/2015 0418   GLUCOSEU 50* 07/31/2015 0418   HGBUR 3+* 07/31/2015 0418   BILIRUBINUR NEGATIVE 07/31/2015 0418    KETONESUR NEGATIVE 07/31/2015 0418   PROTEINUR 100* 07/31/2015 0418   NITRITE NEGATIVE 07/31/2015 0418   LEUKOCYTESUR NEGATIVE 07/31/2015 0418    Pertinent Imaging:   Assessment & Plan:   1. Urinary retention- suspect this has been subacute with overflow incontinence at night exacerbated by acute illness, possible urinary tract infection, medication related. S/p successful Foley placement by Dr. Erlene Quan while patient was admitted. Continued edema and erythema of the foreskin today.  Patient requesting Foley removal today though I then to him that should he fail his voiding trial and Foley catheter replacement would remain extremely difficult due to continued edema of his foreskin. He would not be able to perform CIC. I recommended that we do for voiding trial or one week the patient is requesting to come back on Friday for a recheck and possible voiding trial at that time.  Will continue Flomax.  2. Phimosis/ Balanoposthitis-  Patient has not been using Mycolog cream as prescribed.  Encouraged patient to use cream as directed. We will recheck on Friday. may consider circumcision in the future  3. AKI- s/p Foley, suspect 2/2 urinary obstruction. Massive retention with 1300 cc in bladder. Cr 1.74 upon discharge from 1.87.  Recheck BMP today and encouraged patient to follow up with Nephrology and PCP.   4. Gross Hematuria- Urinary tract infection versus trauma to urinary tract from patient's stepping on catheter tubing. Urine specimen obtained and sent for culture today. Will start patient on empiric antibiotic therapy for suspected urinary tract infection. Ceftin pending culture results.  There are no diagnoses linked to this encounter.  Return for Friday for recheck/possible voiding trial.  These notes generated with voice recognition software. I apologize for typographical errors.  Herbert Moors, [facility] [address], [place],  [place] [address] [phone]

## 2015-08-06 NOTE — Patient Instructions (Signed)
Schedule follow up soon with your nephrologist (kidney doctor) as well as you primary care physician.  Pick up your

## 2015-08-06 NOTE — Telephone Encounter (Signed)
Pt needs a work note on letterhead saying he was in the hospital/under dr's care from Feb 7th to 11th.  He would like a copy faxed Attn: Bruce at [facility] [phone] and also a copy mailed to pt's home.

## 2015-08-06 NOTE — Telephone Encounter (Signed)
Tried calling cell # pts spouse asked that we call home number as she was not at the home but had access to the cell. Tried to call patient and let him know letter would have to be approved by Dr. Luan Pulling who is out of the office today. According to discharge patient was inpatient 07/29/15-08/01/15. Patient is scheduled for hosp f/u on 08/11/15 @ 1:30.

## 2015-08-07 LAB — BASIC METABOLIC PANEL
BUN/Creatinine Ratio: 14 (ref 10–22)
BUN: 24 mg/dL (ref 8–27)
CALCIUM: 8.6 mg/dL (ref 8.6–10.2)
CHLORIDE: 98 mmol/L (ref 96–106)
CO2: 19 mmol/L (ref 18–29)
Creatinine, Ser: 1.68 mg/dL — ABNORMAL HIGH (ref 0.76–1.27)
GFR calc Af Amer: 45 mL/min/{1.73_m2} — ABNORMAL LOW (ref >59)
GFR, EST NON AFRICAN AMERICAN: 39 mL/min/{1.73_m2} — AB (ref >59)
Glucose: 139 mg/dL — ABNORMAL HIGH (ref 65–99)
POTASSIUM: 5.1 mmol/L (ref 3.5–5.2)
Sodium: 133 mmol/L — ABNORMAL LOW (ref 134–144)

## 2015-08-07 NOTE — Telephone Encounter (Signed)
Ok-jh 

## 2015-08-07 NOTE — Telephone Encounter (Signed)
Done and faxed. [facility]

## 2015-08-07 NOTE — Telephone Encounter (Signed)
Called to discuss note having been faxed and wife is at a hotel. She said he coughs so bad she has not slept in days so she has checked in to hotel and still has flu herself. She did finally take the Tamiflu and will see him later to advise that we faxed note.

## 2015-08-07 NOTE — Telephone Encounter (Signed)
OK for note requested for dates requested.  HJe was hospitalized dirint most of that time.  -jh

## 2015-08-08 ENCOUNTER — Ambulatory Visit: Payer: PPO | Admitting: Obstetrics and Gynecology

## 2015-08-08 ENCOUNTER — Encounter: Payer: Self-pay | Admitting: Obstetrics and Gynecology

## 2015-08-08 LAB — CULTURE, URINE COMPREHENSIVE

## 2015-08-11 ENCOUNTER — Ambulatory Visit
Admission: RE | Admit: 2015-08-11 | Discharge: 2015-08-11 | Disposition: A | Discharge status: Home / Self Care | Payer: PPO | Source: Ambulatory Visit | Attending: Family Medicine | Admitting: Family Medicine

## 2015-08-11 ENCOUNTER — Encounter: Payer: Self-pay | Admitting: Family Medicine

## 2015-08-11 ENCOUNTER — Ambulatory Visit (INDEPENDENT_AMBULATORY_CARE_PROVIDER_SITE_OTHER): Payer: PPO | Admitting: Family Medicine

## 2015-08-11 ENCOUNTER — Telehealth: Payer: Self-pay

## 2015-08-11 VITALS — BP 149/71 | HR 80 | Temp 97.4°F | Resp 16 | Ht 72.5 in | Wt 217.0 lb

## 2015-08-11 DIAGNOSIS — R0602 Shortness of breath: Secondary | ICD-10-CM | POA: Insufficient documentation

## 2015-08-11 DIAGNOSIS — I2510 Atherosclerotic heart disease of native coronary artery without angina pectoris: Secondary | ICD-10-CM

## 2015-08-11 DIAGNOSIS — I509 Heart failure, unspecified: Secondary | ICD-10-CM | POA: Diagnosis not present

## 2015-08-11 DIAGNOSIS — I10 Essential (primary) hypertension: Secondary | ICD-10-CM | POA: Diagnosis not present

## 2015-08-11 DIAGNOSIS — N179 Acute kidney failure, unspecified: Secondary | ICD-10-CM

## 2015-08-11 DIAGNOSIS — R339 Retention of urine, unspecified: Secondary | ICD-10-CM

## 2015-08-11 DIAGNOSIS — Z794 Long term (current) use of insulin: Secondary | ICD-10-CM

## 2015-08-11 DIAGNOSIS — R05 Cough: Secondary | ICD-10-CM | POA: Diagnosis not present

## 2015-08-11 DIAGNOSIS — R059 Cough, unspecified: Secondary | ICD-10-CM

## 2015-08-11 DIAGNOSIS — E083399 Diabetes mellitus due to underlying condition with moderate nonproliferative diabetic retinopathy without macular edema, unspecified eye: Secondary | ICD-10-CM | POA: Diagnosis not present

## 2015-08-11 DIAGNOSIS — R1900 Intra-abdominal and pelvic swelling, mass and lump, unspecified site: Secondary | ICD-10-CM | POA: Diagnosis not present

## 2015-08-11 MED ORDER — LOSARTAN POTASSIUM 25 MG PO TABS: 25.0000 mg | ORAL_TABLET | Freq: Every day | ORAL | Status: DC

## 2015-08-11 MED ORDER — FUROSEMIDE 20 MG PO TABS: 20.0000 mg | ORAL_TABLET | Freq: Every day | ORAL | Status: DC

## 2015-08-11 NOTE — Telephone Encounter (Signed)
-----   Message from Roda Shutters, [place] sent at 08/11/2015  8:14 AM EST ----- Please notify patient that his urine culture was negative for infection. His kidney function has improved slightly. I believe he missed his appointment on Friday for recheck we please make sure he is rescheduled for follow-up appointment for a voidng trial sometime this week. thanks

## 2015-08-11 NOTE — Progress Notes (Signed)
Name: [name]   MRN: [id number]    DOB: [birth year]   Date:08/11/2015       Progress Note  Subjective  Chief Complaint Here for f/u of hospitalization for flu.  He had urethral blockage and has a catheter in place.  To see Uroilogist tomorrow for poss. Removal of catheter.   He c/o a chronic throaty, dry, tickly cough that has been getting worse and worsse.   Chief Complaint  Patient presents with  . Cough    2-3 weeks dry and wife had to stay at hotel. SOB very bad.  . Edema    swollen all over and 15-20 lbs heavier. Worried about fluid. Cath removed tomorrow he hopes.    Cough Associated symptoms include shortness of breath and wheezing. Pertinent negatives include no chest pain, chills, fever, headaches, heartburn, myalgias or weight loss.     No problem-specific assessment & plan notes found for this encounter.   Past Medical History  Diagnosis Date  . Carcinoma of skin   . Diabetic retinopathy ([place])   . Diabetes mellitus without complication ([place])   . GERD (gastroesophageal reflux disease)   . Coronary artery disease 2005    CABG X 7 in 2005  . Hypertension   . Cauda equina compression [facility])     Past Surgical History  Procedure Laterality Date  . Cardiac catheterization  2005  . Coronary artery bypass graft  2005     CABG x 7 at [facility]   . Cataract extraction  2014    right eye   . Knee surgery      left knee  . Back surgery  07/2012    Family History  Problem Relation Age of Onset  . Hypertension Sister   . Hyperlipidemia Sister     Social History   Social History  . Marital Status: Married    Spouse Name: N/A  . Number of Children: N/A  . Years of Education: N/A   Occupational History  . Not on file.   Social History Main Topics  . Smoking status: Never Smoker   . Smokeless tobacco: Never Used  . Alcohol Use: 0.0 oz/week    0 Standard drinks or equivalent per week     Comment: occasional.  . Drug Use: No  . Sexual Activity: Not  on file   Other Topics Concern  . Not on file   Social History Narrative     Current outpatient prescriptions:  .  ACCU-CHEK SOFTCLIX LANCETS lancets, , Disp: , Rfl:  .  acetaminophen (TYLENOL) 500 MG tablet, Take 1,000 mg by mouth every 6 (six) hours as needed for mild pain, fever or headache., Disp: , Rfl:  .  amitriptyline (ELAVIL) 10 MG tablet, Take 2 tablets (20 mg total) by mouth at bedtime., Disp: 180 tablet, Rfl: 3 .  amLODipine (NORVASC) 10 MG tablet, Take 10 mg by mouth daily., Disp: , Rfl:  .  cefUROXime (CEFTIN) 250 MG tablet, Take 1 tablet (250 mg total) by mouth 2 (two) times daily with a meal., Disp: 14 tablet, Rfl: 0 .  Cholecalciferol (VITAMIN D3) 400 units CAPS, Take 400 Units by mouth 2 (two) times daily., Disp: , Rfl:  .  glipiZIDE (GLUCOTROL) 5 MG tablet, , Disp: , Rfl: 0 .  guaiFENesin (MUCINEX) 600 MG 12 hr tablet, Take by mouth 2 (two) times daily., Disp: , Rfl:  .  insulin glargine (LANTUS) 100 UNIT/ML injection, Inject 0.2 mLs (20 Units total) into the skin  at bedtime., Disp: 10 mL, Rfl: 12 .  metroNIDAZOLE (FLAGYL) 500 MG tablet, Take 1 tablet (500 mg total) by mouth every 8 (eight) hours. X 7 days, Disp: 21 tablet, Rfl: 0 .  omeprazole (PRILOSEC) 20 MG capsule, Take 20 mg by mouth daily. , Disp: , Rfl:  .  simvastatin (ZOCOR) 10 MG tablet, Take 1 tablet (10 mg total) by mouth daily at 6 PM., Disp: 90 tablet, Rfl: 3 .  tamsulosin (FLOMAX) 0.4 MG CAPS capsule, Take 1 capsule (0.4 mg total) by mouth daily., Disp: 30 capsule, Rfl: 3 .  furosemide (LASIX) 20 MG tablet, Take 1 tablet (20 mg total) by mouth daily., Disp: 30 tablet, Rfl: 3 .  losartan (COZAAR) 25 MG tablet, Take 1 tablet (25 mg total) by mouth daily., Disp: 30 tablet, Rfl: 6  Allergies  Allergen Reactions  . Ivp Dye [Iodinated Diagnostic Agents] Other (See Comments)    Reaction:  Unknown   . Codeine Nausea And Vomiting and Rash     Review of Systems  Constitutional: Positive for malaise/fatigue.  Negative for fever, chills and weight loss.  HENT: Negative for hearing loss.   Eyes: Negative for blurred vision and double vision.  Respiratory: Positive for cough, shortness of breath and wheezing.   Cardiovascular: Negative for chest pain, palpitations and leg swelling.  Gastrointestinal: Negative for heartburn, abdominal pain and blood in stool.  Genitourinary: Negative for dysuria, urgency and frequency.       Has indwelling catheter in place.  Musculoskeletal: Negative for myalgias and joint pain.  Neurological: Positive for weakness. Negative for dizziness, tremors and headaches.      Objective  Filed Vitals:   08/11/15 1341  BP: 149/71  Pulse: 80  Temp: 97.4 F (36.3 C)  TempSrc: Oral  Resp: 16  Height: 6' 0.5" (1.842 m)  Weight: 217 lb (98.431 kg)  SpO2: 99%    Physical Exam  Constitutional: He is oriented to person, place, and time and well-developed, well-nourished, and in no distress. No distress.  HENT:  Head: Normocephalic and atraumatic.  Eyes: Conjunctivae and EOM are normal. Pupils are equal, round, and reactive to light. No scleral icterus.  Neck: Normal range of motion. Neck supple. Carotid bruit is not present. No thyromegaly present.  Cardiovascular: Normal rate, regular rhythm and normal heart sounds.  Exam reveals no gallop and no friction rub.   No murmur heard. Pulmonary/Chest: Effort normal and breath sounds normal. No respiratory distress. He has no wheezes. He has no rales.  Has dry, throaty hacky cough present.  Abdominal: Soft. Bowel sounds are normal. He exhibits no distension and no mass. There is no tenderness.  Musculoskeletal: He exhibits edema (1+ pitting edema to knees.).  Lymphadenopathy:    He has no cervical adenopathy.  Neurological: He is alert and oriented to person, place, and time.  Vitals reviewed.      Recent Results (from the past 2160 hour(s))  Culture, blood (routine x 2)     Status: None   Collection Time:  07/29/15 11:20 AM  Result Value Ref Range   Specimen Description BLOOD LEFT ASSIST CONTROL    Special Requests BOTTLES DRAWN AEROBIC AND ANAEROBIC 1CC    Culture NO GROWTH 5 DAYS    Report Status 08/03/2015 FINAL   Hemoglobin A1c     Status: Abnormal   Collection Time: 07/29/15 11:49 AM  Result Value Ref Range   Hgb A1c MFr Bld 7.2 (H) 4.0 - 6.0 %  Comprehensive metabolic panel  Status: Abnormal   Collection Time: 07/29/15 11:50 AM  Result Value Ref Range   Sodium 133 (L) 135 - 145 mmol/L   Potassium 4.7 3.5 - 5.1 mmol/L   Chloride 105 101 - 111 mmol/L   CO2 19 (L) 22 - 32 mmol/L   Glucose, Bld 161 (H) 65 - 99 mg/dL   BUN 38 (H) 6 - 20 mg/dL   Creatinine, Ser 2.02 (H) 0.61 - 1.24 mg/dL   Calcium 8.3 (L) 8.9 - 10.3 mg/dL   Total Protein 7.1 6.5 - 8.1 g/dL   Albumin 3.7 3.5 - 5.0 g/dL   AST 29 15 - 41 U/L   ALT 15 (L) 17 - 63 U/L   Alkaline Phosphatase 50 38 - 126 U/L   Total Bilirubin 0.8 0.3 - 1.2 mg/dL   GFR calc non Af Amer 30 (L) >60 mL/min   GFR calc Af Amer 35 (L) >60 mL/min    Comment: (NOTE) The eGFR has been calculated using the CKD EPI equation. This calculation has not been validated in all clinical situations. eGFR's persistently <60 mL/min signify possible Chronic Kidney Disease.    Anion gap 9 5 - 15  Lactic acid, plasma     Status: None   Collection Time: 07/29/15 11:50 AM  Result Value Ref Range   Lactic Acid, Venous 1.0 0.5 - 2.0 mmol/L  CBC with Differential     Status: Abnormal   Collection Time: 07/29/15 11:50 AM  Result Value Ref Range   WBC 8.6 3.8 - 10.6 K/uL   RBC 4.10 (L) 4.40 - 5.90 MIL/uL   Hemoglobin 11.7 (L) 13.0 - 18.0 g/dL   HCT 34.1 (L) 40.0 - 52.0 %   MCV 83.3 80.0 - 100.0 fL   MCH 28.5 26.0 - 34.0 pg   MCHC 34.3 32.0 - 36.0 g/dL   RDW 13.6 11.5 - 14.5 %   Platelets 157 150 - 440 K/uL   Neutrophils Relative % 81 %   Neutro Abs 7.0 (H) 1.4 - 6.5 K/uL   Lymphocytes Relative 8 %   Lymphs Abs 0.7 (L) 1.0 - 3.6 K/uL   Monocytes  Relative 10 %   Monocytes Absolute 0.8 0.2 - 1.0 K/uL   Eosinophils Relative 0 %   Eosinophils Absolute 0.0 0 - 0.7 K/uL   Basophils Relative 1 %   Basophils Absolute 0.1 0 - 0.1 K/uL  Culture, blood (routine x 2)     Status: None   Collection Time: 07/29/15 11:50 AM  Result Value Ref Range   Specimen Description BLOOD LEFT ARM    Special Requests      BOTTLES DRAWN AEROBIC AND ANAEROBIC AERO 3CC ANA [place]   Culture NO GROWTH 5 DAYS    Report Status 08/03/2015 FINAL   Influenza panel by PCR (type A & B, H1N1)     Status: Abnormal   Collection Time: 07/29/15  1:00 PM  Result Value Ref Range   Influenza A By PCR POSITIVE (A) NEGATIVE    Comment: CRITICAL RESULT CALLED TO, READ BACK BY AND VERIFIED WITH: JILL DOTRONE 07/29/15 1457 SGD    Influenza B By PCR NEGATIVE NEGATIVE   H1N1 flu by pcr NOT DETECTED NOT DETECTED    Comment:        The Xpert Flu assay (FDA approved for nasal aspirates or washes and nasopharyngeal swab specimens), is intended as an aid in the diagnosis of influenza and should not be used as a sole basis for treatment.  Glucose, capillary     Status: Abnormal   Collection Time: 07/29/15  5:23 PM  Result Value Ref Range   Glucose-Capillary 188 (H) 65 - 99 mg/dL   Comment 1 Notify RN   Urinalysis complete, with microscopic ([facility] only)     Status: Abnormal   Collection Time: 07/29/15  5:50 PM  Result Value Ref Range   Color, Urine YELLOW (A) YELLOW   APPearance HAZY (A) CLEAR   Glucose, UA 50 (A) NEGATIVE mg/dL   Bilirubin Urine NEGATIVE NEGATIVE   Ketones, ur NEGATIVE NEGATIVE mg/dL   Specific Gravity, Urine 1.014 1.005 - 1.030   Hgb urine dipstick 1+ (A) NEGATIVE   pH 5.0 5.0 - 8.0   Protein, ur 100 (A) NEGATIVE mg/dL   Nitrite NEGATIVE NEGATIVE   Leukocytes, UA 2+ (A) NEGATIVE   RBC / HPF 0-5 0 - 5 RBC/hpf   WBC, UA 0-5 0 - 5 WBC/hpf   Bacteria, UA NONE SEEN NONE SEEN   Squamous Epithelial / LPF 0-5 (A) NONE SEEN   Mucous PRESENT   Urine culture      Status: None   Collection Time: 07/29/15  5:50 PM  Result Value Ref Range   Specimen Description URINE, RANDOM    Special Requests NONE    Culture INSIGNIFICANT GROWTH    Report Status 08/01/2015 FINAL   Glucose, capillary     Status: Abnormal   Collection Time: 07/29/15  9:06 PM  Result Value Ref Range   Glucose-Capillary 143 (H) 65 - 99 mg/dL   Comment 1 Notify RN   Basic metabolic panel     Status: Abnormal   Collection Time: 07/30/15  4:25 AM  Result Value Ref Range   Sodium 131 (L) 135 - 145 mmol/L   Potassium 4.2 3.5 - 5.1 mmol/L   Chloride 103 101 - 111 mmol/L   CO2 19 (L) 22 - 32 mmol/L   Glucose, Bld 90 65 - 99 mg/dL   BUN 40 (H) 6 - 20 mg/dL   Creatinine, Ser 2.09 (H) 0.61 - 1.24 mg/dL   Calcium 7.9 (L) 8.9 - 10.3 mg/dL   GFR calc non Af Amer 30 (L) >60 mL/min   GFR calc Af Amer 35 (L) >60 mL/min    Comment: (NOTE) The eGFR has been calculated using the CKD EPI equation. This calculation has not been validated in all clinical situations. eGFR's persistently <60 mL/min signify possible Chronic Kidney Disease.    Anion gap 9 5 - 15  CK     Status: Abnormal   Collection Time: 07/30/15  4:25 AM  Result Value Ref Range   Total CK 552 (H) 49 - 397 U/L  Hemoglobin A1c     Status: Abnormal   Collection Time: 07/30/15  4:25 AM  Result Value Ref Range   Hgb A1c MFr Bld 7.1 (H) 4.0 - 6.0 %  Glucose, capillary     Status: Abnormal   Collection Time: 07/30/15  7:31 AM  Result Value Ref Range   Glucose-Capillary 57 (L) 65 - 99 mg/dL  Glucose, capillary     Status: None   Collection Time: 07/30/15  8:18 AM  Result Value Ref Range   Glucose-Capillary 97 65 - 99 mg/dL  Glucose, capillary     Status: Abnormal   Collection Time: 07/30/15 10:54 AM  Result Value Ref Range   Glucose-Capillary 138 (H) 65 - 99 mg/dL  Osmolality, urine     Status: None   Collection Time: 07/30/15  5:50  PM  Result Value Ref Range   Osmolality, Ur 490 300 - 900 mOsm/kg  Glucose, capillary      Status: Abnormal   Collection Time: 07/30/15  9:20 PM  Result Value Ref Range   Glucose-Capillary 119 (H) 65 - 99 mg/dL   Comment 1 Notify RN   Urinalysis complete, with microscopic ([facility] only)     Status: Abnormal   Collection Time: 07/31/15  4:18 AM  Result Value Ref Range   Color, Urine YELLOW (A) YELLOW   APPearance CLEAR (A) CLEAR   Glucose, UA 50 (A) NEGATIVE mg/dL   Bilirubin Urine NEGATIVE NEGATIVE   Ketones, ur NEGATIVE NEGATIVE mg/dL   Specific Gravity, Urine 1.012 1.005 - 1.030   Hgb urine dipstick 3+ (A) NEGATIVE   pH 5.0 5.0 - 8.0   Protein, ur 100 (A) NEGATIVE mg/dL   Nitrite NEGATIVE NEGATIVE   Leukocytes, UA NEGATIVE NEGATIVE   RBC / HPF TOO NUMEROUS TO COUNT 0 - 5 RBC/hpf   WBC, UA 0-5 0 - 5 WBC/hpf   Bacteria, UA NONE SEEN NONE SEEN   Squamous Epithelial / LPF NONE SEEN NONE SEEN   Mucous PRESENT   Urine culture     Status: None   Collection Time: 07/31/15  4:18 AM  Result Value Ref Range   Specimen Description URINE, CATHETERIZED    Special Requests NONE    Culture NO GROWTH 1 DAY    Report Status 08/01/2015 FINAL   Glucose, capillary     Status: None   Collection Time: 07/31/15  7:59 AM  Result Value Ref Range   Glucose-Capillary 75 65 - 99 mg/dL  Basic metabolic panel     Status: Abnormal   Collection Time: 07/31/15 11:11 AM  Result Value Ref Range   Sodium 132 (L) 135 - 145 mmol/L   Potassium 4.6 3.5 - 5.1 mmol/L   Chloride 105 101 - 111 mmol/L   CO2 20 (L) 22 - 32 mmol/L   Glucose, Bld 162 (H) 65 - 99 mg/dL   BUN 39 (H) 6 - 20 mg/dL   Creatinine, Ser 1.87 (H) 0.61 - 1.24 mg/dL   Calcium 8.0 (L) 8.9 - 10.3 mg/dL   GFR calc non Af Amer 33 (L) >60 mL/min   GFR calc Af Amer 38 (L) >60 mL/min    Comment: (NOTE) The eGFR has been calculated using the CKD EPI equation. This calculation has not been validated in all clinical situations. eGFR's persistently <60 mL/min signify possible Chronic Kidney Disease.    Anion gap 7 5 - 15  Glucose,  capillary     Status: Abnormal   Collection Time: 07/31/15 11:26 AM  Result Value Ref Range   Glucose-Capillary 144 (H) 65 - 99 mg/dL  Glucose, capillary     Status: Abnormal   Collection Time: 07/31/15  3:43 PM  Result Value Ref Range   Glucose-Capillary 146 (H) 65 - 99 mg/dL  Basic metabolic panel     Status: Abnormal   Collection Time: 08/01/15  4:47 AM  Result Value Ref Range   Sodium 132 (L) 135 - 145 mmol/L   Potassium 4.6 3.5 - 5.1 mmol/L   Chloride 106 101 - 111 mmol/L   CO2 18 (L) 22 - 32 mmol/L   Glucose, Bld 181 (H) 65 - 99 mg/dL   BUN 38 (H) 6 - 20 mg/dL   Creatinine, Ser 1.74 (H) 0.61 - 1.24 mg/dL   Calcium 7.9 (L) 8.9 - 10.3 mg/dL   GFR  calc non Af Amer 36 (L) >60 mL/min   GFR calc Af Amer 42 (L) >60 mL/min    Comment: (NOTE) The eGFR has been calculated using the CKD EPI equation. This calculation has not been validated in all clinical situations. eGFR's persistently <60 mL/min signify possible Chronic Kidney Disease.    Anion gap 8 5 - 15  CBC with Differential/Platelet     Status: Abnormal   Collection Time: 08/01/15  4:47 AM  Result Value Ref Range   WBC 5.1 3.8 - 10.6 K/uL   RBC 3.80 (L) 4.40 - 5.90 MIL/uL   Hemoglobin 10.5 (L) 13.0 - 18.0 g/dL   HCT 31.0 (L) 40.0 - 52.0 %   MCV 81.7 80.0 - 100.0 fL   MCH 27.7 26.0 - 34.0 pg   MCHC 33.9 32.0 - 36.0 g/dL   RDW 13.9 11.5 - 14.5 %   Platelets 145 (L) 150 - 440 K/uL   Neutrophils Relative % 57 %   Neutro Abs 2.9 1.4 - 6.5 K/uL   Lymphocytes Relative 27 %   Lymphs Abs 1.4 1.0 - 3.6 K/uL   Monocytes Relative 11 %   Monocytes Absolute 0.6 0.2 - 1.0 K/uL   Eosinophils Relative 4 %   Eosinophils Absolute 0.2 0 - 0.7 K/uL   Basophils Relative 1 %   Basophils Absolute 0.0 0 - 0.1 K/uL  Glucose, capillary     Status: Abnormal   Collection Time: 08/01/15  8:18 AM  Result Value Ref Range   Glucose-Capillary 155 (H) 65 - 99 mg/dL   Comment 1 Notify RN   Glucose, capillary     Status: Abnormal   Collection  Time: 08/01/15 11:32 AM  Result Value Ref Range   Glucose-Capillary 230 (H) 65 - 99 mg/dL   Comment 1 Notify RN   Glucose, capillary     Status: Abnormal   Collection Time: 08/01/15  4:45 PM  Result Value Ref Range   Glucose-Capillary 180 (H) 65 - 99 mg/dL  Urinalysis, Complete     Status: Abnormal   Collection Time: 08/06/15 10:19 AM  Result Value Ref Range   Specific Gravity, UA 1.025 1.005 - 1.030   pH, UA 5.5 5.0 - 7.5   Color, UA Amber (A) Yellow   Appearance Ur Turbid (A) Clear   Leukocytes, UA Trace (A) Negative   Protein, UA 3+ (A) Negative/Trace   Glucose, UA Trace (A) Negative   Ketones, UA Trace (A) Negative   RBC, UA 3+ (A) Negative   Bilirubin, UA Negative Negative   Urobilinogen, Ur 1.0 0.2 - 1.0 mg/dL   Nitrite, UA Negative Negative   Microscopic Examination See below:   CULTURE, URINE COMPREHENSIVE     Status: None   Collection Time: 08/06/15 10:19 AM  Result Value Ref Range   Urine Culture, Comprehensive Final report    Result 1 Comment     Comment: No growth in 36 - 48 hours.  Microscopic Examination     Status: Abnormal   Collection Time: 08/06/15 10:19 AM  Result Value Ref Range   WBC, UA 6-10 (A) 0 -  5 /hpf   RBC, UA >30 (A) 0 -  2 /hpf   Epithelial Cells (non renal) None seen 0 - 10 /hpf   Bacteria, UA None seen None seen/Few  Basic metabolic panel     Status: Abnormal   Collection Time: 08/06/15 10:32 AM  Result Value Ref Range   Glucose 139 (H) 65 - 99 mg/dL  BUN 24 8 - 27 mg/dL   Creatinine, Ser 1.68 (H) 0.76 - 1.27 mg/dL   GFR calc non Af Amer 39 (L) >59 mL/min/1.73   GFR calc Af Amer 45 (L) >59 mL/min/1.73   BUN/Creatinine Ratio 14 10 - 22   Sodium 133 (L) 134 - 144 mmol/L   Potassium 5.1 3.5 - 5.2 mmol/L   Chloride 98 96 - 106 mmol/L   CO2 19 18 - 29 mmol/L   Calcium 8.6 8.6 - 10.2 mg/dL     Assessment & Plan  Problem List Items Addressed This Visit      Cardiovascular and Mediastinum   Coronary artery disease   Relevant  Medications   losartan (COZAAR) 25 MG tablet   furosemide (LASIX) 20 MG tablet   Hypertension   Relevant Medications   losartan (COZAAR) 25 MG tablet   furosemide (LASIX) 20 MG tablet   CHF (congestive heart failure) (HCC)   Relevant Medications   losartan (COZAAR) 25 MG tablet   furosemide (LASIX) 20 MG tablet   Other Relevant Orders   Ambulatory referral to Cardiology   DG Chest 2 View   CBC with Differential   Comprehensive Metabolic Panel (CMET)     Endocrine   Diabetes (HCC)   Relevant Medications   losartan (COZAAR) 25 MG tablet     Genitourinary   Acute kidney injury (HCC)   Urinary retention     Other   Abdominal mass   Relevant Orders   CT Abdomen Pelvis Wo Contrast    Other Visit Diagnoses    Cough    -  Primary       Meds ordered this encounter  Medications  . guaiFENesin (MUCINEX) 600 MG 12 hr tablet    Sig: Take by mouth 2 (two) times daily.  Marland Kitchen losartan (COZAAR) 25 MG tablet    Sig: Take 1 tablet (25 mg total) by mouth daily.    Dispense:  30 tablet    Refill:  6  . furosemide (LASIX) 20 MG tablet    Sig: Take 1 tablet (20 mg total) by mouth daily.    Dispense:  30 tablet    Refill:  3   1. Abdominal mass  - CT Abdomen Pelvis Wo Contrast; Future  2. Congestive heart failure, unspecified congestive heart failure chronicity, unspecified congestive heart failure type ([place])  - Ambulatory referral to Cardiology - furosemide (LASIX) 20 MG tablet; Take 1 tablet (20 mg total) by mouth daily.  Dispense: 30 tablet; Refill: 3 - DG Chest 2 View; Future - CBC with Differential - Comprehensive Metabolic Panel (CMET)  3. Essential hypertension  - losartan (COZAAR) 25 MG tablet; Take 1 tablet (25 mg total) by mouth daily.  Dispense: 30 tablet; Refill: 6  4. Coronary artery disease involving native coronary artery of native heart without angina pectoris   5. 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 Urinary retention   7. Acute kidney injury ([place])   8. Cough Discontinue Lisinopril

## 2015-08-11 NOTE — Telephone Encounter (Signed)
Spoke with pt in reference to -ucx and missed appt for voiding trial. Pt stated he must have gotten confused and that's why he missed his appt. Per Ria Comment pt can have voiding trial Tuesday or Wednesday of this week. Therefore pt was transferred to the front to make voiding trial appt.

## 2015-08-12 ENCOUNTER — Encounter: Payer: Self-pay | Admitting: Obstetrics and Gynecology

## 2015-08-12 ENCOUNTER — Telehealth: Payer: Self-pay

## 2015-08-12 ENCOUNTER — Ambulatory Visit (INDEPENDENT_AMBULATORY_CARE_PROVIDER_SITE_OTHER): Payer: PPO | Admitting: Obstetrics and Gynecology

## 2015-08-12 ENCOUNTER — Other Ambulatory Visit: Payer: Self-pay

## 2015-08-12 VITALS — BP 149/76 | HR 85 | Resp 16 | Ht 72.5 in | Wt 217.4 lb

## 2015-08-12 DIAGNOSIS — R339 Retention of urine, unspecified: Secondary | ICD-10-CM

## 2015-08-12 DIAGNOSIS — N471 Phimosis: Secondary | ICD-10-CM

## 2015-08-12 LAB — BLADDER SCAN AMB NON-IMAGING

## 2015-08-12 MED ORDER — FINASTERIDE 5 MG PO TABS: 5.0000 mg | ORAL_TABLET | Freq: Every day | ORAL | Status: DC

## 2015-08-12 NOTE — Progress Notes (Signed)
Fill and Pull Catheter Removal  Patient is present today for a catheter removal.  Patient was cleaned and prepped in a sterile fashion 250 ml of sterile water/ saline was instilled into the bladder when the patient felt the urge to urinate. 10 ml of water was then drained from the balloon.  A 16FR foley cath was removed from the bladder no complications were noted .  Patient as then given some time to void on their own.  Patient could not void on their own after some time.  Patient tolerated well.  Preformed by: Golden Hurter, CMA  Follow up/ Additional notes: Pt. was instructed on CIC by Herbert Moors, FNP and will return in one (1) week.

## 2015-08-12 NOTE — Addendum Note (Signed)
Addended by: Bettye Boeck on: 08/12/2015 04:34 PM   Modules accepted: Orders

## 2015-08-12 NOTE — Progress Notes (Signed)
12:36 PM   ASAD VANDUYNE [birth year] [id number]  Referring provider: [name], MD [place], [place] [address]  Chief Complaint  Patient presents with  . Urinary Retention    Voiding trial    HPI: [name] is a 75 y.o. year old male who admitted to the hospital with influenza A 07/30/15. He initially developed generalized weakness, coughing, myalgias and presented to his PCP who sent him to the emergency room for further care. In the emergency room, he was found to have acute kidney injury with a creatinine elevated 2 which at that time was thought to be prerenal.   He had some difficulty voiding and was found to have greater than a liter in his bladder on bladder scan. He also has a history of phimosis and has developed some redness/irritation of his foreskin along with redness in the scrotum over the past few days. Nursing attempted to place a Foley but had difficulty secondary to phimosis therefore urology was asked to assist.  Mr. Hooven does have a history of urinary retention requiring a Foley catheter during the length of his hospitalization for back surgery several years ago at [facility]. Otherwise commonly he denies any history of weak stream, difficulty voiding. He does get up 2 or 3 times at night to urinate. Incidentally, over the past month or so, he has started wearing depends at night due to urinary leakage at night which is a new symptom. He does not have a urologist and was not taking any medications for his prostate. No history of urinary tract infections or gross hematuria. Patient was started on Flomax during admission.  Creatinine improved after catheter placement and was 1.74 upon discharge.  Renal ultrasound 07/30/2015 shows no evidence of hydronephrosis bilaterally.  He presents today for outpatient follow-up and for voiding trial. He reports continued edema of his foreskin. He states that he stepped on his catheter tubing a few nights ago causing him  some pain. Gross hematuria noted in patient's Foley drainage bag which he states has been occurring for a few days. He is unsure if it began after he stepped on the Foley tubing. He reports continued cough. Denies any fevers. He is not experiencing any flank pain. He is very anxious about continued Foley placement. He was not provided a leg bag upon discharge from the hospital.  Interval History 08/12/15 Patient reports improvement in scrotal and penile swelling. No further gross hematuria. He was prescribed a diuretic by his primary care provider. Otherwise he reports feeling well. He presents for a voiding trial today.   PMH: Past Medical History  Diagnosis Date  . Carcinoma of skin   . Diabetic retinopathy ([place])   . Diabetes mellitus without complication ([place])   . GERD (gastroesophageal reflux disease)   . Coronary artery disease 2005    CABG X 7 in 2005  . Hypertension   . Cauda equina compression [facility])     Surgical History: Past Surgical History  Procedure Laterality Date  . Cardiac catheterization  2005  . Coronary artery bypass graft  2005     CABG x 7 at [facility]   . Cataract extraction  2014    right eye   . Knee surgery      left knee  . Back surgery  07/2012    Home Medications:    Medication List       This list is accurate as of: 08/12/15 12:36 PM.  Always use your  most recent med list.               ACCU-CHEK SOFTCLIX LANCETS lancets     acetaminophen 500 MG tablet  Commonly known as:  TYLENOL  Take 1,000 mg by mouth every 6 (six) hours as needed for mild pain, fever or headache.     amitriptyline 10 MG tablet  Commonly known as:  ELAVIL  Take 2 tablets (20 mg total) by mouth at bedtime.     amLODipine 10 MG tablet  Commonly known as:  NORVASC  Take 10 mg by mouth daily.     cefUROXime 250 MG tablet  Commonly known as:  CEFTIN  Take 1 tablet (250 mg total) by mouth 2 (two) times daily with a meal.     furosemide 20 MG tablet  Commonly  known as:  LASIX  Take 1 tablet (20 mg total) by mouth daily.     glipiZIDE 5 MG tablet  Commonly known as:  GLUCOTROL     guaiFENesin 600 MG 12 hr tablet  Commonly known as:  MUCINEX  Take by mouth 2 (two) times daily.     insulin glargine 100 UNIT/ML injection  Commonly known as:  LANTUS  Inject 0.2 mLs (20 Units total) into the skin at bedtime.     losartan 25 MG tablet  Commonly known as:  COZAAR  Take 1 tablet (25 mg total) by mouth daily.     metroNIDAZOLE 500 MG tablet  Commonly known as:  FLAGYL  Take 1 tablet (500 mg total) by mouth every 8 (eight) hours. X 7 days     omeprazole 20 MG capsule  Commonly known as:  PRILOSEC  Take 20 mg by mouth daily.     simvastatin 10 MG tablet  Commonly known as:  ZOCOR  Take 1 tablet (10 mg total) by mouth daily at 6 PM.     tamsulosin 0.4 MG Caps capsule  Commonly known as:  FLOMAX  Take 1 capsule (0.4 mg total) by mouth daily.     Vitamin D3 400 units Caps  Take 400 Units by mouth 2 (two) times daily.        Allergies:  Allergies  Allergen Reactions  . Ivp Dye [Iodinated Diagnostic Agents] Other (See Comments)    Reaction:  Unknown   . Codeine Nausea And Vomiting and Rash    Family History: Family History  Problem Relation Age of Onset  . Hypertension Sister   . Hyperlipidemia Sister     Social History:  reports that he has never smoked. He has never used smokeless tobacco. He reports that he drinks alcohol. He reports that he does not use illicit drugs.  ROS: UROLOGY Frequent Urination?: No Hard to postpone urination?: No Burning/pain with urination?: No Get up at night to urinate?: No Leakage of urine?: No Urine stream starts and stops?: No Trouble starting stream?: No Do you have to strain to urinate?: No Blood in urine?: No Urinary tract infection?: No Sexually transmitted disease?: No Injury to kidneys or bladder?: No Painful intercourse?: No Weak stream?: No Erection problems?: No Penile  pain?: No  Gastrointestinal Nausea?: No Vomiting?: No Indigestion/heartburn?: No Diarrhea?: No Constipation?: No  Constitutional Fever: No Night sweats?: No Weight loss?: No Fatigue?: No  Skin Skin rash/lesions?: No Itching?: No  Eyes Blurred vision?: No Double vision?: No  Ears/Nose/Throat Sore throat?: No Sinus problems?: No  Hematologic/Lymphatic Swollen glands?: No Easy bruising?: No  Cardiovascular Leg swelling?: No Chest pain?: No  Respiratory  Cough?: No Shortness of breath?: No  Endocrine Excessive thirst?: No  Musculoskeletal Back pain?: No Joint pain?: No  Neurological Headaches?: No Dizziness?: No  Psychologic Depression?: No Anxiety?: No  Physical Exam: BP 149/76 mmHg  Pulse 85  Resp 16  Ht 6' 0.5" (1.842 m)  Wt 217 lb 6.4 oz (98.612 kg)  BMI 29.06 kg/m2  Constitutional:  Alert and oriented, No acute distress. HEENT: [place] AT, moist mucus membranes.  Trachea midline, no masses. Cardiovascular: No clubbing, cyanosis,  minimal bilateral edema BLEs Respiratory: Normal respiratory effort, no increased work of breathing. GI: Abdomen is soft, nontender, nondistended, no abdominal masses GU: No CVA tenderness.  phimotic foreskin, difficult to retract but much improved from prior exam. Glands palpable within foreskin. Scrotum with diffusely mild erythema without fluctuance/ tenderness/ edema. Testicles normal, nontender without masses. DRE: deferred until f/u visit- patient very uncomfortable today Skin: No rashes, bruises or suspicious lesions. Lymph: No cervical or inguinal adenopathy. Neurologic: Grossly intact, no focal deficits, moving all 4 extremities. Psychiatric: Normal mood and affect.  Laboratory Data:  Lab Results  Component Value Date   WBC 5.1 08/01/2015   HGB 10.5* 08/01/2015   HCT 31.0* 08/01/2015   MCV 81.7 08/01/2015   PLT 145* 08/01/2015    Lab Results  Component Value Date   CREATININE 1.68* 08/06/2015     No results found for: PSA  No results found for: TESTOSTERONE  Lab Results  Component Value Date   HGBA1C 7.1* 07/30/2015    Urinalysis    Component Value Date/Time   COLORURINE YELLOW* 07/31/2015 0418   APPEARANCEUR CLEAR* 07/31/2015 0418   LABSPEC 1.012 07/31/2015 0418   PHURINE 5.0 07/31/2015 0418   GLUCOSEU Trace* 08/06/2015 1019   HGBUR 3+* 07/31/2015 0418   BILIRUBINUR Negative 08/06/2015 1019   BILIRUBINUR NEGATIVE 07/31/2015 0418   KETONESUR NEGATIVE 07/31/2015 0418   PROTEINUR 100* 07/31/2015 0418   NITRITE Negative 08/06/2015 1019   NITRITE NEGATIVE 07/31/2015 0418   LEUKOCYTESUR Trace* 08/06/2015 1019   LEUKOCYTESUR NEGATIVE 07/31/2015 0418    Pertinent Imaging:   Assessment & Plan:   1. Urinary retention- suspect this has been subacute with overflow incontinence at night exacerbated by acute illness, possible urinary tract infection, medication related. S/p successful Foley placement by Dr. Erlene Quan while patient was admitted. Resolution of previous edema and erythema of the foreskin.   Will continue Flomax and start Finasteride.  DRE remarkable for mildly enlarged prostate without nodularities. Instructed on CIC. He was advised to perform catheterization 3 times daily and as needed. He will record residuals and taper as directed for PVRs <280mL.  Will see him back in 1 week to ensure that he is tolerating this well.  2. Phimosis/ Balanoposthitis- Resolved. Patient has been using Mycolog cream as prescribed. We will recheck on Friday. may consider circumcision in the future  3. AKI- s/p Foley, suspect 2/2 urinary obstruction. Massive retention with 1300 cc in bladder. Cr 1.74 upon discharge from 1.87.  Recheck BMP last visit Cr 1.68- encouraged patient to follow up with Nephrology and PCP.   4. Gross Hematuria-  Urinary tract infection versus trauma to urinary tract from patient's stepping on catheter tubing. Urine culture obtained last negative. Patient  denies any further gross hematuria. Most likely d/t trauma, we'll continue to monitor and proceed with hematuria workup should symptoms recur.  There are no diagnoses linked to this encounter.  Return in about 1 month (around 09/09/2015) for DRE/PVR recheck urinary retention.  These notes generated with  voice recognition software. I apologize for typographical errors.  Herbert Moors, [facility] [address], [place], [place] [address] [phone]

## 2015-08-12 NOTE — Telephone Encounter (Signed)
That sounds good-jh

## 2015-08-12 NOTE — Telephone Encounter (Signed)
Wife called stating they did not remove cath at Urology. Patient now suicidal and very depressed. I gave number to [employer] and [employer]. I also called [facility] as they want Home Health to come help with the cath and there is no maint. needed at this time.

## 2015-08-12 NOTE — Telephone Encounter (Signed)
[name] at Urology recommends he call them with any concerns related to cath. He does not need Home Health and was ashown how to change cath 3 times daily. I spoke to wife who said he is very hard to deal with and I advised her to call Urology. Patient came by and said to me he has never been more depressed. He asked about home health again. I advised him to call [name] and see if he qualifies. He feels with all these appts he needs home ehalth to assist him some. He is calling [name] but wants to be qualified beyond Urology needs. I advised him to go with urology plan and we will discuss Home Health issues after he adjusts to cath needs.

## 2015-08-14 ENCOUNTER — Ambulatory Visit: Payer: PPO | Admitting: Obstetrics and Gynecology

## 2015-08-14 ENCOUNTER — Telehealth: Payer: Self-pay

## 2015-08-14 NOTE — Telephone Encounter (Signed)
ok-jh

## 2015-08-14 NOTE — Telephone Encounter (Signed)
Called to see how patient is doing on changing caths. I spoke to wife who said he did get up and urinate on his own last night. I did not get to speak to patient when I called home but I will try again Monday.

## 2015-08-18 ENCOUNTER — Ambulatory Visit: Payer: PPO | Admitting: Obstetrics and Gynecology

## 2015-08-18 ENCOUNTER — Ambulatory Visit (INDEPENDENT_AMBULATORY_CARE_PROVIDER_SITE_OTHER): Payer: PPO | Admitting: Obstetrics and Gynecology

## 2015-08-18 ENCOUNTER — Encounter: Payer: Self-pay | Admitting: Obstetrics and Gynecology

## 2015-08-18 VITALS — BP 177/77 | HR 86 | Resp 16 | Ht 72.5 in | Wt 214.1 lb

## 2015-08-18 DIAGNOSIS — R310 Gross hematuria: Secondary | ICD-10-CM | POA: Diagnosis not present

## 2015-08-18 DIAGNOSIS — R339 Retention of urine, unspecified: Secondary | ICD-10-CM

## 2015-08-18 LAB — BLADDER SCAN AMB NON-IMAGING: Scan Result: 999

## 2015-08-18 NOTE — Progress Notes (Signed)
12:30 PM   [name] 02-10-1941 [id number]  Referring provider: [name], MD [place], [place] [address]  Chief Complaint  Patient presents with  . Urinary Retention  . Follow-up    HPI: [name] is a 75 y.o. year old male who admitted to the hospital with influenza A 07/30/15. He initially developed generalized weakness, coughing, myalgias and presented to his PCP who sent him to the emergency room for further care. In the emergency room, he was found to have acute kidney injury with a creatinine elevated 2 which at that time was thought to be prerenal.   He had some difficulty voiding and was found to have greater than a liter in his bladder on bladder scan. He also has a history of phimosis and has developed some redness/irritation of his foreskin along with redness in the scrotum over the past few days. Nursing attempted to place a Foley but had difficulty secondary to phimosis therefore urology was asked to assist.  Mr. Dorff does have a history of urinary retention requiring a Foley catheter during the length of his hospitalization for back surgery several years ago at [facility]. Otherwise commonly he denies any history of weak stream, difficulty voiding. He does get up 2 or 3 times at night to urinate. Incidentally, over the past month or so, he has started wearing depends at night due to urinary leakage at night which is a new symptom. He does not have a urologist and was not taking any medications for his prostate. No history of urinary tract infections or gross hematuria. Patient was started on Flomax during admission.  Creatinine improved after catheter placement and was 1.74 upon discharge.  Renal ultrasound 07/30/2015 shows no evidence of hydronephrosis bilaterally.  He presents today for outpatient follow-up and for voiding trial. He reports continued edema of his foreskin. He states that he stepped on his catheter tubing a few nights ago causing him some  pain. Gross hematuria noted in patient's Foley drainage bag which he states has been occurring for a few days. He is unsure if it began after he stepped on the Foley tubing. He reports continued cough. Denies any fevers. He is not experiencing any flank pain. He is very anxious about continued Foley placement. He was not provided a leg bag upon discharge from the hospital.  Interval History 08/12/15 Patient reports improvement in scrotal and penile swelling. No further gross hematuria. He was prescribed a diuretic by his primary care provider. Otherwise he reports feeling well. He has completed his voiding diary though he has not been performing CIC as instructed. He denies any pain, abdominal bloating or any other urinary symptoms today.  PMH: Past Medical History  Diagnosis Date  . Carcinoma of skin   . Diabetic retinopathy ([place])   . Diabetes mellitus without complication ([place])   . GERD (gastroesophageal reflux disease)   . Coronary artery disease 2005    CABG X 7 in 2005  . Hypertension   . Cauda equina compression [facility])     Surgical History: Past Surgical History  Procedure Laterality Date  . Cardiac catheterization  2005  . Coronary artery bypass graft  2005     CABG x 7 at [facility]   . Cataract extraction  2014    right eye   . Knee surgery      left knee  . Back surgery  07/2012    Home Medications:    Medication List  This list is accurate as of: 08/18/15 12:30 PM.  Always use your most recent med list.               ACCU-CHEK SOFTCLIX LANCETS lancets     acetaminophen 500 MG tablet  Commonly known as:  TYLENOL  Take 1,000 mg by mouth every 6 (six) hours as needed for mild pain, fever or headache.     amitriptyline 10 MG tablet  Commonly known as:  ELAVIL  Take 2 tablets (20 mg total) by mouth at bedtime.     amLODipine 10 MG tablet  Commonly known as:  NORVASC  Take 10 mg by mouth daily.     benzonatate 100 MG capsule  Commonly known as:   TESSALON  take 1 capsule by mouth three times a day if needed for cough     finasteride 5 MG tablet  Commonly known as:  PROSCAR  Take 1 tablet (5 mg total) by mouth daily.     furosemide 20 MG tablet  Commonly known as:  LASIX  Take 1 tablet (20 mg total) by mouth daily.     glipiZIDE 5 MG tablet  Commonly known as:  GLUCOTROL     guaiFENesin 600 MG 12 hr tablet  Commonly known as:  MUCINEX  Take by mouth 2 (two) times daily.     insulin glargine 100 UNIT/ML injection  Commonly known as:  LANTUS  Inject 0.2 mLs (20 Units total) into the skin at bedtime.     losartan 25 MG tablet  Commonly known as:  COZAAR  Take 1 tablet (25 mg total) by mouth daily.     omeprazole 20 MG capsule  Commonly known as:  PRILOSEC  Take 20 mg by mouth daily.     simvastatin 10 MG tablet  Commonly known as:  ZOCOR  Take 1 tablet (10 mg total) by mouth daily at 6 PM.     tamsulosin 0.4 MG Caps capsule  Commonly known as:  FLOMAX  Take 1 capsule (0.4 mg total) by mouth daily.     Vitamin D3 400 units Caps  Take 400 Units by mouth 2 (two) times daily.        Allergies:  Allergies  Allergen Reactions  . Ivp Dye [Iodinated Diagnostic Agents] Other (See Comments)    Reaction:  Unknown   . Codeine Nausea And Vomiting and Rash    Family History: Family History  Problem Relation Age of Onset  . Hypertension Sister   . Hyperlipidemia Sister     Social History:  reports that he has never smoked. He has never used smokeless tobacco. He reports that he drinks alcohol. He reports that he does not use illicit drugs.  ROS: UROLOGY Frequent Urination?: Yes Hard to postpone urination?: No Burning/pain with urination?: No Get up at night to urinate?: Yes Leakage of urine?: No Urine stream starts and stops?: No Trouble starting stream?: No Do you have to strain to urinate?: No Blood in urine?: No Urinary tract infection?: No Sexually transmitted disease?: No Injury to kidneys or  bladder?: Yes Painful intercourse?: No Weak stream?: No Erection problems?: No Penile pain?: No  Gastrointestinal Nausea?: No Vomiting?: No Indigestion/heartburn?: No Diarrhea?: No Constipation?: No  Constitutional Fever: No Night sweats?: No Weight loss?: No Fatigue?: No  Skin Skin rash/lesions?: No Itching?: No  Eyes Blurred vision?: No Double vision?: No  Ears/Nose/Throat Sore throat?: No Sinus problems?: No  Hematologic/Lymphatic Swollen glands?: No Easy bruising?: No  Cardiovascular Leg swelling?: Yes  Chest pain?: No  Respiratory Cough?: Yes Shortness of breath?: Yes  Endocrine Excessive thirst?: No  Musculoskeletal Back pain?: No Joint pain?: No  Neurological Headaches?: No Dizziness?: No  Psychologic Depression?: No Anxiety?: No  Physical Exam: BP 177/77 mmHg  Pulse 86  Resp 16  Ht 6' 0.5" (1.842 m)  Wt 214 lb 1.6 oz (97.115 kg)  BMI 28.62 kg/m2  Constitutional:  Alert and oriented, No acute distress. HEENT:  AT, moist mucus membranes.  Trachea midline, no masses. Cardiovascular: No clubbing, cyanosis,  minimal bilateral edema BLEs Respiratory: Normal respiratory effort, no increased work of breathing. GI: Abdomen is soft, nontender, nondistended, no abdominal masses DRE: patient declined today, will perform at f/u visit Skin: No rashes, bruises or suspicious lesions. Lymph: No cervical or inguinal adenopathy. Neurologic: Grossly intact, no focal deficits, moving all 4 extremities. Psychiatric: Normal mood and affect.  Laboratory Data:  Lab Results  Component Value Date   WBC 5.1 08/01/2015   HGB 10.5* 08/01/2015   HCT 31.0* 08/01/2015   MCV 81.7 08/01/2015   PLT 145* 08/01/2015    Lab Results  Component Value Date   CREATININE 1.68* 08/06/2015    No results found for: PSA  No results found for: TESTOSTERONE  Lab Results  Component Value Date   HGBA1C 7.1* 07/30/2015    Urinalysis    Component Value  Date/Time   COLORURINE YELLOW* 07/31/2015 0418   APPEARANCEUR CLEAR* 07/31/2015 0418   LABSPEC 1.012 07/31/2015 0418   PHURINE 5.0 07/31/2015 0418   GLUCOSEU Trace* 08/06/2015 1019   HGBUR 3+* 07/31/2015 0418   BILIRUBINUR Negative 08/06/2015 1019   BILIRUBINUR NEGATIVE 07/31/2015 0418   KETONESUR NEGATIVE 07/31/2015 0418   PROTEINUR 100* 07/31/2015 0418   NITRITE Negative 08/06/2015 1019   NITRITE NEGATIVE 07/31/2015 0418   LEUKOCYTESUR Trace* 08/06/2015 1019   LEUKOCYTESUR NEGATIVE 07/31/2015 0418    Pertinent Imaging:   Assessment & Plan:   1. Urinary retention- Patient with a significantly elevated PVR today. Bladder diary indicates that patient has been voiding approximately 250 mL's to 300 approximately 4-5 times daily with frequent nocturia as well. He is currently asymptomatic. He had significant difficulty attempting CIC and states that he is unable to perform the catheterizations himself at home. He is declining Foley catheter placement today. Given that he is asymptomatic we may continue with infrequent monitoring of his kidney function. Previous renal ultrasound showed no hydronephrosis and his creatinine levels have been improving. He has an appointment with his nephrologist tomorrow. CT abdomen and pelvis pending ordered by patient's primary care provider. We will see him in 3 weeks to review the CT results and assess for possible hydronephrosis or other GU abnormalities. We'll continue to finasteride and Flomax. -Patient declined DRE today.  Will need exam at next visit.  2. Phimosis/ Balanoposthitis- Resolved. Patient has been using Mycolog cream as prescribed. We will recheck on Friday. may consider circumcision in the future  3. AKI- s/p Foley, suspect 2/2 urinary obstruction. Massive retention with 1300 cc in bladder. Cr 1.74 upon discharge from 1.87.  Recheck BMP last visit Cr 1.68- encouraged patient to follow up with Nephrology and PCP.   4. Gross Hematuria-   Urinary tract infection versus trauma to urinary tract from patient's stepping on catheter tubing. Urine culture obtained last negative. Patient denies any further gross hematuria. Most likely d/t trauma, we'll continue to monitor and proceed with hematuria workup should symptoms recur.  There are no diagnoses linked to this encounter.  Return  in about 3 weeks (around 09/08/2015) for  with [facility]  These notes generated with voice recognition software. I apologize for typographical errors.  Herbert Moors, [facility] [address] [place], [place] [address] [phone]

## 2015-08-19 ENCOUNTER — Ambulatory Visit: Payer: PPO | Admitting: Obstetrics and Gynecology

## 2015-08-19 ENCOUNTER — Ambulatory Visit: Payer: PPO

## 2015-08-19 DIAGNOSIS — N183 Chronic kidney disease, stage 3 (moderate): Secondary | ICD-10-CM | POA: Diagnosis not present

## 2015-08-19 DIAGNOSIS — E875 Hyperkalemia: Secondary | ICD-10-CM | POA: Diagnosis not present

## 2015-08-19 DIAGNOSIS — R809 Proteinuria, unspecified: Secondary | ICD-10-CM | POA: Diagnosis not present

## 2015-08-19 DIAGNOSIS — I129 Hypertensive chronic kidney disease with stage 1 through stage 4 chronic kidney disease, or unspecified chronic kidney disease: Secondary | ICD-10-CM | POA: Diagnosis not present

## 2015-08-19 DIAGNOSIS — N2581 Secondary hyperparathyroidism of renal origin: Secondary | ICD-10-CM | POA: Diagnosis not present

## 2015-08-25 ENCOUNTER — Ambulatory Visit: Admission: RE | Admit: 2015-08-25 | Payer: PPO | Source: Ambulatory Visit

## 2015-08-27 ENCOUNTER — Other Ambulatory Visit: Payer: Self-pay | Admitting: Family Medicine

## 2015-08-27 MED ORDER — AMLODIPINE BESYLATE 10 MG PO TABS: 10.0000 mg | ORAL_TABLET | Freq: Every day | ORAL | Status: DC

## 2015-08-27 NOTE — Telephone Encounter (Signed)
Pt called requesting a refill on [place]

## 2015-09-01 ENCOUNTER — Ambulatory Visit
Admission: RE | Admit: 2015-09-01 | Discharge: 2015-09-01 | Disposition: A | Discharge status: Home / Self Care | Payer: PPO | Source: Ambulatory Visit | Attending: Family Medicine | Admitting: Family Medicine

## 2015-09-01 DIAGNOSIS — K5730 Diverticulosis of large intestine without perforation or abscess without bleeding: Secondary | ICD-10-CM | POA: Insufficient documentation

## 2015-09-01 DIAGNOSIS — N329 Bladder disorder, unspecified: Secondary | ICD-10-CM | POA: Diagnosis not present

## 2015-09-01 DIAGNOSIS — K8689 Other specified diseases of pancreas: Secondary | ICD-10-CM | POA: Insufficient documentation

## 2015-09-01 DIAGNOSIS — I700 Atherosclerosis of aorta: Secondary | ICD-10-CM | POA: Insufficient documentation

## 2015-09-01 DIAGNOSIS — K8020 Calculus of gallbladder without cholecystitis without obstruction: Secondary | ICD-10-CM | POA: Insufficient documentation

## 2015-09-01 DIAGNOSIS — R918 Other nonspecific abnormal finding of lung field: Secondary | ICD-10-CM | POA: Insufficient documentation

## 2015-09-01 DIAGNOSIS — R1900 Intra-abdominal and pelvic swelling, mass and lump, unspecified site: Secondary | ICD-10-CM | POA: Insufficient documentation

## 2015-09-01 DIAGNOSIS — R1904 Left lower quadrant abdominal swelling, mass and lump: Secondary | ICD-10-CM | POA: Diagnosis not present

## 2015-09-02 ENCOUNTER — Ambulatory Visit: Payer: PPO | Admitting: Cardiology

## 2015-09-02 DIAGNOSIS — L851 Acquired keratosis [keratoderma] palmaris et plantaris: Secondary | ICD-10-CM | POA: Diagnosis not present

## 2015-09-02 DIAGNOSIS — E119 Type 2 diabetes mellitus without complications: Secondary | ICD-10-CM | POA: Diagnosis not present

## 2015-09-02 DIAGNOSIS — B351 Tinea unguium: Secondary | ICD-10-CM | POA: Diagnosis not present

## 2015-09-05 ENCOUNTER — Ambulatory Visit (INDEPENDENT_AMBULATORY_CARE_PROVIDER_SITE_OTHER): Payer: PPO | Admitting: Family Medicine

## 2015-09-05 ENCOUNTER — Encounter: Payer: Self-pay | Admitting: Family Medicine

## 2015-09-05 VITALS — BP 128/58 | HR 86 | Resp 16 | Ht 72.0 in | Wt 212.0 lb

## 2015-09-05 DIAGNOSIS — K5730 Diverticulosis of large intestine without perforation or abscess without bleeding: Secondary | ICD-10-CM | POA: Diagnosis not present

## 2015-09-05 DIAGNOSIS — K639 Disease of intestine, unspecified: Secondary | ICD-10-CM | POA: Diagnosis not present

## 2015-09-05 DIAGNOSIS — I25118 Atherosclerotic heart disease of native coronary artery with other forms of angina pectoris: Secondary | ICD-10-CM | POA: Diagnosis not present

## 2015-09-05 DIAGNOSIS — K5790 Diverticulosis of intestine, part unspecified, without perforation or abscess without bleeding: Secondary | ICD-10-CM | POA: Insufficient documentation

## 2015-09-05 DIAGNOSIS — K829 Disease of gallbladder, unspecified: Secondary | ICD-10-CM | POA: Diagnosis not present

## 2015-09-05 NOTE — Progress Notes (Signed)
Name: [name]   MRN: [id number]    DOB: Jan 31, 1941   Date:09/05/2015       Progress Note  Subjective  Chief Complaint  Chief Complaint  Patient presents with  . Results    Discuss CT    HPI Here to discuss abdominal CT scan result.   No problem-specific assessment & plan notes found for this encounter.   Past Medical History  Diagnosis Date  . Carcinoma of skin   . Diabetic retinopathy ([place])   . Diabetes mellitus without complication ([place])   . GERD (gastroesophageal reflux disease)   . Coronary artery disease 2005    CABG X 7 in 2005  . Hypertension   . Cauda equina compression [facility])     Social History  Substance Use Topics  . Smoking status: Never Smoker   . Smokeless tobacco: Never Used  . Alcohol Use: 0.0 oz/week    0 Standard drinks or equivalent per week     Comment: occasional.     Current outpatient prescriptions:  .  ACCU-CHEK SOFTCLIX LANCETS lancets, , Disp: , Rfl:  .  acetaminophen (TYLENOL) 500 MG tablet, Take 1,000 mg by mouth every 6 (six) hours as needed for mild pain, fever or headache., Disp: , Rfl:  .  amitriptyline (ELAVIL) 10 MG tablet, Take 2 tablets (20 mg total) by mouth at bedtime., Disp: 180 tablet, Rfl: 3 .  amLODipine (NORVASC) 10 MG tablet, Take 1 tablet (10 mg total) by mouth daily., Disp: 30 tablet, Rfl: 6 .  Cholecalciferol (VITAMIN D3) 400 units CAPS, Take 400 Units by mouth 2 (two) times daily., Disp: , Rfl:  .  finasteride (PROSCAR) 5 MG tablet, Take 1 tablet (5 mg total) by mouth daily., Disp: 30 tablet, Rfl: 3 .  furosemide (LASIX) 20 MG tablet, Take 1 tablet (20 mg total) by mouth daily., Disp: 30 tablet, Rfl: 3 .  glipiZIDE (GLUCOTROL) 5 MG tablet, , Disp: , Rfl: 0 .  insulin glargine (LANTUS) 100 UNIT/ML injection, Inject 0.2 mLs (20 Units total) into the skin at bedtime., Disp: 10 mL, Rfl: 12 .  losartan (COZAAR) 25 MG tablet, Take 1 tablet (25 mg total) by mouth daily., Disp: 30 tablet, Rfl: 6 .  omeprazole (PRILOSEC)  20 MG capsule, Take 20 mg by mouth daily. , Disp: , Rfl:  .  simvastatin (ZOCOR) 10 MG tablet, Take 1 tablet (10 mg total) by mouth daily at 6 PM., Disp: 90 tablet, Rfl: 3 .  tamsulosin (FLOMAX) 0.4 MG CAPS capsule, Take 1 capsule (0.4 mg total) by mouth daily., Disp: 30 capsule, Rfl: 3  Allergies  Allergen Reactions  . Ivp Dye [Iodinated Diagnostic Agents] Other (See Comments)    Reaction:  Unknown   . Codeine Nausea And Vomiting and Rash    Review of Systems  Constitutional: Negative for fever, chills, weight loss and malaise/fatigue.  HENT: Negative for hearing loss.   Eyes: Negative for blurred vision and double vision.  Respiratory: Positive for cough. Negative for shortness of breath and wheezing.   Cardiovascular: Negative for chest pain, palpitations and leg swelling.  Gastrointestinal: Negative for heartburn, abdominal pain and blood in stool.  Genitourinary: Negative for dysuria, urgency and frequency.  Skin: Negative for rash.  Neurological: Negative for weakness and headaches.      Objective  Filed Vitals:   09/05/15 1057  BP: 128/58  Pulse: 86  Resp: 16  Height: 6' (1.829 m)  Weight: 212 lb (96.163 kg)     Physical  Exam  Vitals reviewed.  No physical exam done today.  Recent Results (from the past 2160 hour(s))  Culture, blood (routine x 2)     Status: None   Collection Time: 07/29/15 11:20 AM  Result Value Ref Range   Specimen Description BLOOD LEFT ASSIST CONTROL    Special Requests BOTTLES DRAWN AEROBIC AND ANAEROBIC 1CC    Culture NO GROWTH 5 DAYS    Report Status 08/03/2015 FINAL   Hemoglobin A1c     Status: Abnormal   Collection Time: 07/29/15 11:49 AM  Result Value Ref Range   Hgb A1c MFr Bld 7.2 (H) 4.0 - 6.0 %  Comprehensive metabolic panel     Status: Abnormal   Collection Time: 07/29/15 11:50 AM  Result Value Ref Range   Sodium 133 (L) 135 - 145 mmol/L   Potassium 4.7 3.5 - 5.1 mmol/L   Chloride 105 101 - 111 mmol/L   CO2 19 (L) 22 -  32 mmol/L   Glucose, Bld 161 (H) 65 - 99 mg/dL   BUN 38 (H) 6 - 20 mg/dL   Creatinine, Ser 2.02 (H) 0.61 - 1.24 mg/dL   Calcium 8.3 (L) 8.9 - 10.3 mg/dL   Total Protein 7.1 6.5 - 8.1 g/dL   Albumin 3.7 3.5 - 5.0 g/dL   AST 29 15 - 41 U/L   ALT 15 (L) 17 - 63 U/L   Alkaline Phosphatase 50 38 - 126 U/L   Total Bilirubin 0.8 0.3 - 1.2 mg/dL   GFR calc non Af Amer 30 (L) >60 mL/min   GFR calc Af Amer 35 (L) >60 mL/min    Comment: (NOTE) The eGFR has been calculated using the CKD EPI equation. This calculation has not been validated in all clinical situations. eGFR's persistently <60 mL/min signify possible Chronic Kidney Disease.    Anion gap 9 5 - 15  Lactic acid, plasma     Status: None   Collection Time: 07/29/15 11:50 AM  Result Value Ref Range   Lactic Acid, Venous 1.0 0.5 - 2.0 mmol/L  CBC with Differential     Status: Abnormal   Collection Time: 07/29/15 11:50 AM  Result Value Ref Range   WBC 8.6 3.8 - 10.6 K/uL   RBC 4.10 (L) 4.40 - 5.90 MIL/uL   Hemoglobin 11.7 (L) 13.0 - 18.0 g/dL   HCT 34.1 (L) 40.0 - 52.0 %   MCV 83.3 80.0 - 100.0 fL   MCH 28.5 26.0 - 34.0 pg   MCHC 34.3 32.0 - 36.0 g/dL   RDW 13.6 11.5 - 14.5 %   Platelets 157 150 - 440 K/uL   Neutrophils Relative % 81 %   Neutro Abs 7.0 (H) 1.4 - 6.5 K/uL   Lymphocytes Relative 8 %   Lymphs Abs 0.7 (L) 1.0 - 3.6 K/uL   Monocytes Relative 10 %   Monocytes Absolute 0.8 0.2 - 1.0 K/uL   Eosinophils Relative 0 %   Eosinophils Absolute 0.0 0 - 0.7 K/uL   Basophils Relative 1 %   Basophils Absolute 0.1 0 - 0.1 K/uL  Culture, blood (routine x 2)     Status: None   Collection Time: 07/29/15 11:50 AM  Result Value Ref Range   Specimen Description BLOOD LEFT ARM    Special Requests      BOTTLES DRAWN AEROBIC AND ANAEROBIC AERO 3CC ANA [place]   Culture NO GROWTH 5 DAYS    Report Status 08/03/2015 FINAL   Influenza panel by PCR (type A &  B, H1N1)     Status: Abnormal   Collection Time: 07/29/15  1:00 PM  Result  Value Ref Range   Influenza A By PCR POSITIVE (A) NEGATIVE    Comment: CRITICAL RESULT CALLED TO, READ BACK BY AND VERIFIED WITH: JILL DOTRONE 07/29/15 1457 SGD    Influenza B By PCR NEGATIVE NEGATIVE   H1N1 flu by pcr NOT DETECTED NOT DETECTED    Comment:        The Xpert Flu assay (FDA approved for nasal aspirates or washes and nasopharyngeal swab specimens), is intended as an aid in the diagnosis of influenza and should not be used as a sole basis for treatment.   Glucose, capillary     Status: Abnormal   Collection Time: 07/29/15  5:23 PM  Result Value Ref Range   Glucose-Capillary 188 (H) 65 - 99 mg/dL   Comment 1 Notify RN   Urinalysis complete, with microscopic ([facility] only)     Status: Abnormal   Collection Time: 07/29/15  5:50 PM  Result Value Ref Range   Color, Urine YELLOW (A) YELLOW   APPearance HAZY (A) CLEAR   Glucose, UA 50 (A) NEGATIVE mg/dL   Bilirubin Urine NEGATIVE NEGATIVE   Ketones, ur NEGATIVE NEGATIVE mg/dL   Specific Gravity, Urine 1.014 1.005 - 1.030   Hgb urine dipstick 1+ (A) NEGATIVE   pH 5.0 5.0 - 8.0   Protein, ur 100 (A) NEGATIVE mg/dL   Nitrite NEGATIVE NEGATIVE   Leukocytes, UA 2+ (A) NEGATIVE   RBC / HPF 0-5 0 - 5 RBC/hpf   WBC, UA 0-5 0 - 5 WBC/hpf   Bacteria, UA NONE SEEN NONE SEEN   Squamous Epithelial / LPF 0-5 (A) NONE SEEN   Mucous PRESENT   Urine culture     Status: None   Collection Time: 07/29/15  5:50 PM  Result Value Ref Range   Specimen Description URINE, RANDOM    Special Requests NONE    Culture INSIGNIFICANT GROWTH    Report Status 08/01/2015 FINAL   Glucose, capillary     Status: Abnormal   Collection Time: 07/29/15  9:06 PM  Result Value Ref Range   Glucose-Capillary 143 (H) 65 - 99 mg/dL   Comment 1 Notify RN   Basic metabolic panel     Status: Abnormal   Collection Time: 07/30/15  4:25 AM  Result Value Ref Range   Sodium 131 (L) 135 - 145 mmol/L   Potassium 4.2 3.5 - 5.1 mmol/L   Chloride 103 101 - 111 mmol/L    CO2 19 (L) 22 - 32 mmol/L   Glucose, Bld 90 65 - 99 mg/dL   BUN 40 (H) 6 - 20 mg/dL   Creatinine, Ser 2.01 (H) 0.61 - 1.24 mg/dL   Calcium 7.9 (L) 8.9 - 10.3 mg/dL   GFR calc non Af Amer 30 (L) >60 mL/min   GFR calc Af Amer 35 (L) >60 mL/min    Comment: (NOTE) The eGFR has been calculated using the CKD EPI equation. This calculation has not been validated in all clinical situations. eGFR's persistently <60 mL/min signify possible Chronic Kidney Disease.    Anion gap 9 5 - 15  CK     Status: Abnormal   Collection Time: 07/30/15  4:25 AM  Result Value Ref Range   Total CK 552 (H) 49 - 397 U/L  Hemoglobin A1c     Status: Abnormal   Collection Time: 07/30/15  4:25 AM  Result Value Ref Range  Hgb A1c MFr Bld 7.1 (H) 4.0 - 6.0 %  Glucose, capillary     Status: Abnormal   Collection Time: 07/30/15  7:31 AM  Result Value Ref Range   Glucose-Capillary 57 (L) 65 - 99 mg/dL  Glucose, capillary     Status: None   Collection Time: 07/30/15  8:18 AM  Result Value Ref Range   Glucose-Capillary 97 65 - 99 mg/dL  Glucose, capillary     Status: Abnormal   Collection Time: 07/30/15 10:54 AM  Result Value Ref Range   Glucose-Capillary 138 (H) 65 - 99 mg/dL  Osmolality, urine     Status: None   Collection Time: 07/30/15  5:50 PM  Result Value Ref Range   Osmolality, Ur 490 300 - 900 mOsm/kg  Glucose, capillary     Status: Abnormal   Collection Time: 07/30/15  9:20 PM  Result Value Ref Range   Glucose-Capillary 119 (H) 65 - 99 mg/dL   Comment 1 Notify RN   Urinalysis complete, with microscopic ([facility] only)     Status: Abnormal   Collection Time: 07/31/15  4:18 AM  Result Value Ref Range   Color, Urine YELLOW (A) YELLOW   APPearance CLEAR (A) CLEAR   Glucose, UA 50 (A) NEGATIVE mg/dL   Bilirubin Urine NEGATIVE NEGATIVE   Ketones, ur NEGATIVE NEGATIVE mg/dL   Specific Gravity, Urine 1.012 1.005 - 1.030   Hgb urine dipstick 3+ (A) NEGATIVE   pH 5.0 5.0 - 8.0   Protein, ur 100 (A)  NEGATIVE mg/dL   Nitrite NEGATIVE NEGATIVE   Leukocytes, UA NEGATIVE NEGATIVE   RBC / HPF TOO NUMEROUS TO COUNT 0 - 5 RBC/hpf   WBC, UA 0-5 0 - 5 WBC/hpf   Bacteria, UA NONE SEEN NONE SEEN   Squamous Epithelial / LPF NONE SEEN NONE SEEN   Mucous PRESENT   Urine culture     Status: None   Collection Time: 07/31/15  4:18 AM  Result Value Ref Range   Specimen Description URINE, CATHETERIZED    Special Requests NONE    Culture NO GROWTH 1 DAY    Report Status 08/01/2015 FINAL   Glucose, capillary     Status: None   Collection Time: 07/31/15  7:59 AM  Result Value Ref Range   Glucose-Capillary 75 65 - 99 mg/dL  Basic metabolic panel     Status: Abnormal   Collection Time: 07/31/15 11:11 AM  Result Value Ref Range   Sodium 132 (L) 135 - 145 mmol/L   Potassium 4.6 3.5 - 5.1 mmol/L   Chloride 105 101 - 111 mmol/L   CO2 20 (L) 22 - 32 mmol/L   Glucose, Bld 162 (H) 65 - 99 mg/dL   BUN 39 (H) 6 - 20 mg/dL   Creatinine, Ser 1.87 (H) 0.61 - 1.24 mg/dL   Calcium 8.0 (L) 8.9 - 10.3 mg/dL   GFR calc non Af Amer 33 (L) >60 mL/min   GFR calc Af Amer 38 (L) >60 mL/min    Comment: (NOTE) The eGFR has been calculated using the CKD EPI equation. This calculation has not been validated in all clinical situations. eGFR's persistently <60 mL/min signify possible Chronic Kidney Disease.    Anion gap 7 5 - 15  Glucose, capillary     Status: Abnormal   Collection Time: 07/31/15 11:26 AM  Result Value Ref Range   Glucose-Capillary 144 (H) 65 - 99 mg/dL  Glucose, capillary     Status: Abnormal   Collection Time:  07/31/15  3:43 PM  Result Value Ref Range   Glucose-Capillary 146 (H) 65 - 99 mg/dL  Basic metabolic panel     Status: Abnormal   Collection Time: 08/01/15  4:47 AM  Result Value Ref Range   Sodium 132 (L) 135 - 145 mmol/L   Potassium 4.6 3.5 - 5.1 mmol/L   Chloride 106 101 - 111 mmol/L   CO2 18 (L) 22 - 32 mmol/L   Glucose, Bld 181 (H) 65 - 99 mg/dL   BUN 38 (H) 6 - 20 mg/dL    Creatinine, Ser 1.74 (H) 0.61 - 1.24 mg/dL   Calcium 7.9 (L) 8.9 - 10.3 mg/dL   GFR calc non Af Amer 36 (L) >60 mL/min   GFR calc Af Amer 42 (L) >60 mL/min    Comment: (NOTE) The eGFR has been calculated using the CKD EPI equation. This calculation has not been validated in all clinical situations. eGFR's persistently <60 mL/min signify possible Chronic Kidney Disease.    Anion gap 8 5 - 15  CBC with Differential/Platelet     Status: Abnormal   Collection Time: 08/01/15  4:47 AM  Result Value Ref Range   WBC 5.1 3.8 - 10.6 K/uL   RBC 3.80 (L) 4.40 - 5.90 MIL/uL   Hemoglobin 10.5 (L) 13.0 - 18.0 g/dL   HCT 31.0 (L) 40.0 - 52.0 %   MCV 81.7 80.0 - 100.0 fL   MCH 27.7 26.0 - 34.0 pg   MCHC 33.9 32.0 - 36.0 g/dL   RDW 13.9 11.5 - 14.5 %   Platelets 145 (L) 150 - 440 K/uL   Neutrophils Relative % 57 %   Neutro Abs 2.9 1.4 - 6.5 K/uL   Lymphocytes Relative 27 %   Lymphs Abs 1.4 1.0 - 3.6 K/uL   Monocytes Relative 11 %   Monocytes Absolute 0.6 0.2 - 1.0 K/uL   Eosinophils Relative 4 %   Eosinophils Absolute 0.2 0 - 0.7 K/uL   Basophils Relative 1 %   Basophils Absolute 0.0 0 - 0.1 K/uL  Glucose, capillary     Status: Abnormal   Collection Time: 08/01/15  8:18 AM  Result Value Ref Range   Glucose-Capillary 155 (H) 65 - 99 mg/dL   Comment 1 Notify RN   Glucose, capillary     Status: Abnormal   Collection Time: 08/01/15 11:32 AM  Result Value Ref Range   Glucose-Capillary 230 (H) 65 - 99 mg/dL   Comment 1 Notify RN   Glucose, capillary     Status: Abnormal   Collection Time: 08/01/15  4:45 PM  Result Value Ref Range   Glucose-Capillary 180 (H) 65 - 99 mg/dL  Urinalysis, Complete     Status: Abnormal   Collection Time: 08/06/15 10:19 AM  Result Value Ref Range   Specific Gravity, UA 1.025 1.005 - 1.030   pH, UA 5.5 5.0 - 7.5   Color, UA Amber (A) Yellow   Appearance Ur Turbid (A) Clear   Leukocytes, UA Trace (A) Negative   Protein, UA 3+ (A) Negative/Trace   Glucose, UA  Trace (A) Negative   Ketones, UA Trace (A) Negative   RBC, UA 3+ (A) Negative   Bilirubin, UA Negative Negative   Urobilinogen, Ur 1.0 0.2 - 1.0 mg/dL   Nitrite, UA Negative Negative   Microscopic Examination See below:   CULTURE, URINE COMPREHENSIVE     Status: None   Collection Time: 08/06/15 10:19 AM  Result Value Ref Range   Urine  Culture, Comprehensive Final report    Result 1 Comment     Comment: No growth in 36 - 48 hours.  Microscopic Examination     Status: Abnormal   Collection Time: 08/06/15 10:19 AM  Result Value Ref Range   WBC, UA 6-10 (A) 0 -  5 /hpf   RBC, UA >30 (A) 0 -  2 /hpf   Epithelial Cells (non renal) None seen 0 - 10 /hpf   Bacteria, UA None seen None seen/Few  Basic metabolic panel     Status: Abnormal   Collection Time: 08/06/15 10:32 AM  Result Value Ref Range   Glucose 139 (H) 65 - 99 mg/dL   BUN 24 8 - 27 mg/dL   Creatinine, Ser 1.68 (H) 0.76 - 1.27 mg/dL   GFR calc non Af Amer 39 (L) >59 mL/min/1.73   GFR calc Af Amer 45 (L) >59 mL/min/1.73   BUN/Creatinine Ratio 14 10 - 22   Sodium 133 (L) 134 - 144 mmol/L   Potassium 5.1 3.5 - 5.2 mmol/L   Chloride 98 96 - 106 mmol/L   CO2 19 18 - 29 mmol/L   Calcium 8.6 8.6 - 10.2 mg/dL  BLADDER SCAN AMB NON-IMAGING     Status: None   Collection Time: 08/12/15 11:50 AM  Result Value Ref Range   Scan Result 541 mL   BLADDER SCAN AMB NON-IMAGING     Status: None   Collection Time: 08/18/15 11:24 AM  Result Value Ref Range   Scan Result 999 mL      Assessment & Plan 1. Gallbladder disease   2. Colon wall thickening   3. Diverticulosis of large intestine without hemorrhage   4. Coronary artery disease involving native coronary artery with other forms of angina pectoris ([place])

## 2015-09-05 NOTE — Patient Instructions (Signed)
Discussed his gall stones, diverticulosis, athrosclerosis, colon was mild thickening, and other findings on his abd, CT scan  He will discuss recommendation for poss. Colonoscopy in the near future at [place].

## 2015-09-08 ENCOUNTER — Ambulatory Visit: Payer: PPO | Admitting: Urology

## 2015-09-09 ENCOUNTER — Ambulatory Visit: Payer: PPO | Admitting: Family Medicine

## 2015-09-22 ENCOUNTER — Telehealth: Payer: Self-pay | Admitting: Family Medicine

## 2015-09-22 ENCOUNTER — Other Ambulatory Visit: Payer: Self-pay | Admitting: Family Medicine

## 2015-09-22 DIAGNOSIS — E083399 Diabetes mellitus due to underlying condition with moderate nonproliferative diabetic retinopathy without macular edema, unspecified eye: Secondary | ICD-10-CM

## 2015-09-22 DIAGNOSIS — Z794 Long term (current) use of insulin: Principal | ICD-10-CM

## 2015-09-22 MED ORDER — AMITRIPTYLINE HCL 10 MG PO TABS: 20.0000 mg | ORAL_TABLET | Freq: Every day | ORAL | Status: DC

## 2015-09-22 NOTE — Telephone Encounter (Signed)
Done by me.-jh

## 2015-09-22 NOTE — Telephone Encounter (Signed)
Can you send in if this is ok?

## 2015-09-22 NOTE — Telephone Encounter (Signed)
Pt.called  Requesting a refill on amitriptyline   [facility] [address] at [place]  Pt call back # is  [phone]

## 2015-10-01 ENCOUNTER — Telehealth: Payer: Self-pay | Admitting: *Deleted

## 2015-10-01 NOTE — Telephone Encounter (Signed)
Patient states [facility] for the past 2 years. Explained to patient there is a cost associated if he request records. Patient dropped off a form from [place] requesting records. Explained to patient that our medical records is not on-site and they are processed weekly. We are not sure what day medical records person will come, but he has already picked up request for this week. He just said get them to the [place] whenever we can.

## 2015-11-10 ENCOUNTER — Ambulatory Visit (INDEPENDENT_AMBULATORY_CARE_PROVIDER_SITE_OTHER): Payer: PPO | Admitting: Family Medicine

## 2015-11-10 ENCOUNTER — Encounter: Payer: Self-pay | Admitting: Family Medicine

## 2015-11-10 VITALS — BP 150/60 | HR 94 | Temp 97.6°F | Resp 16 | Ht 72.0 in | Wt 207.0 lb

## 2015-11-10 DIAGNOSIS — K639 Disease of intestine, unspecified: Secondary | ICD-10-CM | POA: Diagnosis not present

## 2015-11-10 DIAGNOSIS — Z794 Long term (current) use of insulin: Secondary | ICD-10-CM

## 2015-11-10 DIAGNOSIS — R350 Frequency of micturition: Secondary | ICD-10-CM | POA: Diagnosis not present

## 2015-11-10 DIAGNOSIS — I2584 Coronary atherosclerosis due to calcified coronary lesion: Secondary | ICD-10-CM | POA: Diagnosis not present

## 2015-11-10 DIAGNOSIS — I10 Essential (primary) hypertension: Secondary | ICD-10-CM | POA: Diagnosis not present

## 2015-11-10 DIAGNOSIS — I5023 Acute on chronic systolic (congestive) heart failure: Secondary | ICD-10-CM | POA: Diagnosis not present

## 2015-11-10 DIAGNOSIS — E0800 Diabetes mellitus due to underlying condition with hyperosmolarity without nonketotic hyperglycemic-hyperosmolar coma (NKHHC): Secondary | ICD-10-CM | POA: Diagnosis not present

## 2015-11-10 DIAGNOSIS — N3000 Acute cystitis without hematuria: Secondary | ICD-10-CM

## 2015-11-10 DIAGNOSIS — I2510 Atherosclerotic heart disease of native coronary artery without angina pectoris: Secondary | ICD-10-CM | POA: Diagnosis not present

## 2015-11-10 DIAGNOSIS — R8290 Unspecified abnormal findings in urine: Secondary | ICD-10-CM | POA: Diagnosis not present

## 2015-11-10 LAB — POCT URINALYSIS DIPSTICK
Bilirubin, UA: NEGATIVE
GLUCOSE UA: 1000
KETONES UA: NEGATIVE
Nitrite, UA: NEGATIVE
SPEC GRAV UA: 1.01
Urobilinogen, UA: 0.2
pH, UA: 6

## 2015-11-10 MED ORDER — LOSARTAN POTASSIUM 25 MG PO TABS: 25.0000 mg | ORAL_TABLET | Freq: Every day | ORAL | Status: DC

## 2015-11-10 MED ORDER — CIPROFLOXACIN HCL 500 MG PO TABS: 500.0000 mg | ORAL_TABLET | Freq: Two times a day (BID) | ORAL | Status: AC

## 2015-11-10 NOTE — Progress Notes (Signed)
Name: [name]   MRN: [id number]    DOB: 12-04-1940   Date:11/10/2015       Progress Note  Subjective  Chief Complaint  Chief Complaint  Patient presents with  . Urinary Tract Infection  . Shortness of Breath    HPI Here for f/u of SOB and hacky cough.  Also with abnormal Abd. CT scan.  He has been seen at [facility] but no further w/u done at this time.  Needs GI and needs Card.  Gets extremely fatigued and SOB with minimal activity and his legs get heavy and tired.  No problem-specific assessment & plan notes found for this encounter.   Past Medical History  Diagnosis Date  . Carcinoma of skin   . Diabetic retinopathy ([place])   . Diabetes mellitus without complication ([place])   . GERD (gastroesophageal reflux disease)   . Coronary artery disease 2005    CABG X 7 in 2005  . Hypertension   . Cauda equina compression [facility])     Past Surgical History  Procedure Laterality Date  . Cardiac catheterization  2005  . Coronary artery bypass graft  2005     CABG x 7 at [facility]   . Cataract extraction  2014    right eye   . Knee surgery      left knee  . Back surgery  07/2012    Family History  Problem Relation Age of Onset  . Hypertension Sister   . Hyperlipidemia Sister     Social History   Social History  . Marital Status: Married    Spouse Name: N/A  . Number of Children: N/A  . Years of Education: N/A   Occupational History  . Not on file.   Social History Main Topics  . Smoking status: Never Smoker   . Smokeless tobacco: Never Used  . Alcohol Use: 0.0 oz/week    0 Standard drinks or equivalent per week     Comment: occasional.  . Drug Use: No  . Sexual Activity: Not on file   Other Topics Concern  . Not on file   Social History Narrative     Current outpatient prescriptions:  .  ACCU-CHEK SOFTCLIX LANCETS lancets, , Disp: , Rfl:  .  acetaminophen (TYLENOL) 500 MG tablet, Take 1,000 mg by mouth every 6 (six) hours as needed for mild pain, fever or  headache., Disp: , Rfl:  .  amitriptyline (ELAVIL) 10 MG tablet, Take 2 tablets (20 mg total) by mouth at bedtime., Disp: 180 tablet, Rfl: 3 .  amLODipine (NORVASC) 10 MG tablet, Take 1 tablet (10 mg total) by mouth daily., Disp: 30 tablet, Rfl: 6 .  Cholecalciferol (VITAMIN D3) 400 units CAPS, Take 400 Units by mouth 2 (two) times daily., Disp: , Rfl:  .  finasteride (PROSCAR) 5 MG tablet, Take 1 tablet (5 mg total) by mouth daily., Disp: 30 tablet, Rfl: 3 .  furosemide (LASIX) 20 MG tablet, Take 1 tablet (20 mg total) by mouth daily., Disp: 30 tablet, Rfl: 3 .  glipiZIDE (GLUCOTROL) 5 MG tablet, , Disp: , Rfl: 0 .  insulin glargine (LANTUS) 100 UNIT/ML injection, Inject 0.2 mLs (20 Units total) into the skin at bedtime. (Patient taking differently: Inject 54 Units into the skin at bedtime. ), Disp: 10 mL, Rfl: 12 .  omeprazole (PRILOSEC) 20 MG capsule, Take 20 mg by mouth daily. , Disp: , Rfl:  .  simvastatin (ZOCOR) 10 MG tablet, Take 1 tablet (10 mg total)  by mouth daily at 6 PM., Disp: 90 tablet, Rfl: 3 .  tamsulosin (FLOMAX) 0.4 MG CAPS capsule, Take 1 capsule (0.4 mg total) by mouth daily., Disp: 30 capsule, Rfl: 3 .  ciprofloxacin (CIPRO) 500 MG tablet, Take 1 tablet (500 mg total) by mouth 2 (two) times daily., Disp: 14 tablet, Rfl: 0 .  losartan (COZAAR) 25 MG tablet, Take 1 tablet (25 mg total) by mouth daily., Disp: 30 tablet, Rfl: 6  Allergies  Allergen Reactions  . Ivp Dye [Iodinated Diagnostic Agents] Other (See Comments)    Reaction:  Unknown   . Codeine Nausea And Vomiting and Rash     Review of Systems  Constitutional: Positive for weight loss and malaise/fatigue. Negative for fever and chills.  HENT: Negative for hearing loss.   Eyes: Negative for blurred vision and double vision.  Respiratory: Positive for cough, shortness of breath and wheezing.   Cardiovascular: Negative for chest pain, palpitations and leg swelling.  Gastrointestinal: Negative for heartburn,  abdominal pain and blood in stool.  Genitourinary: Positive for dysuria and frequency. Negative for urgency.  Skin: Negative for rash.  Neurological: Positive for weakness. Negative for tremors and headaches.      Objective  Filed Vitals:   11/10/15 1048 11/10/15 1123  BP: 172/80 150/60  Pulse: 94   Temp: 97.6 F (36.4 C)   TempSrc: Oral   Resp: 16   Height: 6' (1.829 m)   Weight: 207 lb (93.895 kg)   SpO2: 99%     Physical Exam  Constitutional: He is oriented to person, place, and time and well-developed, well-nourished, and in no distress. No distress.  HENT:  Head: Normocephalic and atraumatic.  Eyes: Conjunctivae and EOM are normal. Pupils are equal, round, and reactive to light. No scleral icterus.  Neck: Normal range of motion. Neck supple. Carotid bruit is not present. No thyromegaly present.  Cardiovascular: Normal rate, regular rhythm and normal heart sounds.  Exam reveals no gallop and no friction rub.   No murmur heard. Pulmonary/Chest: Effort normal and breath sounds normal. No respiratory distress. He has no wheezes. He has no rales.  Musculoskeletal: He exhibits edema (trace  bilateral pedal edema).  Lymphadenopathy:    He has no cervical adenopathy.  Neurological: He is alert and oriented to person, place, and time.  Vitals reviewed.      Recent Results (from the past 2160 hour(s))  BLADDER SCAN AMB NON-IMAGING     Status: None   Collection Time: 08/12/15 11:50 AM  Result Value Ref Range   Scan Result 541 mL   BLADDER SCAN AMB NON-IMAGING     Status: None   Collection Time: 08/18/15 11:24 AM  Result Value Ref Range   Scan Result 999 mL   POCT Urinalysis Dipstick     Status: Abnormal   Collection Time: 11/10/15 10:48 AM  Result Value Ref Range   Color, UA amber    Clarity, UA cloudy    Glucose, UA 1000    Bilirubin, UA neg    Ketones, UA neg    Spec Grav, UA 1.010    Blood, UA moderate    pH, UA 6.0    Protein, UA 300+++    Urobilinogen, UA  0.2    Nitrite, UA neg    Leukocytes, UA moderate (2+) (A) Negative     Assessment & Plan  Problem List Items Addressed This Visit      Cardiovascular and Mediastinum   Coronary artery disease   Relevant Medications  losartan (COZAAR) 25 MG tablet   Hypertension   Relevant Medications   losartan (COZAAR) 25 MG tablet   CHF (congestive heart failure) (HCC)   Relevant Medications   losartan (COZAAR) 25 MG tablet     Digestive   Colon wall thickening     Endocrine   Diabetes (HCC)   Relevant Medications   losartan (COZAAR) 25 MG tablet    Other Visit Diagnoses    Urine frequency    -  Primary    Relevant Orders    POCT Urinalysis Dipstick (Completed)    Urine Culture    Abnormal urine odor        Relevant Orders    POCT Urinalysis Dipstick (Completed)    Urine Culture    Acute cystitis without hematuria        Relevant Medications    ciprofloxacin (CIPRO) 500 MG tablet       Meds ordered this encounter  Medications  . losartan (COZAAR) 25 MG tablet    Sig: Take 1 tablet (25 mg total) by mouth daily.    Dispense:  30 tablet    Refill:  6  . ciprofloxacin (CIPRO) 500 MG tablet    Sig: Take 1 tablet (500 mg total) by mouth 2 (two) times daily.    Dispense:  14 tablet    Refill:  0   1. Urine frequency  - POCT Urinalysis Dipstick - Urine Culture  2. Abnormal urine odor  - POCT Urinalysis Dipstick - Urine Culture  3. Essential hypertension  - losartan (COZAAR) 25 MG tablet; Take 1 tablet (25 mg total) by mouth daily.  Dispense: 30 tablet; Refill: 6  4. Coronary artery disease due to calcified coronary lesion   5. Acute on chronic systolic congestive heart failure ([place])   6. Colon wall thickening   7. Diabetes mellitus due to underlying condition with hyperosmolarity without coma, with long-term current use of insulin (HCC)   8. Acute cystitis without hematuria  - ciprofloxacin (CIPRO) 500 MG tablet; Take 1 tablet (500 mg total) by mouth 2  (two) times daily.  Dispense: 14 tablet; Refill: 0   Cont. All current meds.

## 2015-11-12 LAB — URINE CULTURE: Organism ID, Bacteria: NO GROWTH

## 2015-12-25 DIAGNOSIS — B351 Tinea unguium: Secondary | ICD-10-CM | POA: Diagnosis not present

## 2015-12-25 DIAGNOSIS — L851 Acquired keratosis [keratoderma] palmaris et plantaris: Secondary | ICD-10-CM | POA: Diagnosis not present

## 2015-12-25 DIAGNOSIS — E119 Type 2 diabetes mellitus without complications: Secondary | ICD-10-CM | POA: Diagnosis not present

## 2016-01-16 ENCOUNTER — Telehealth: Payer: Self-pay | Admitting: *Deleted

## 2016-01-16 NOTE — Telephone Encounter (Signed)
Dr. Arnoldo Morale with [facility] called asking for recent labs. Explained that patient has multiple physician and diabetes is not managed here. Patient was there today and bs was 429. Patient was sent to ED for care. She requested copy of CT scan for their records and also was given # to [facility] (Dr. Juleen China).

## 2016-01-19 NOTE — Telephone Encounter (Signed)
Noted-jh 

## 2016-02-17 ENCOUNTER — Ambulatory Visit (INDEPENDENT_AMBULATORY_CARE_PROVIDER_SITE_OTHER): Payer: PPO | Admitting: Family Medicine

## 2016-02-17 ENCOUNTER — Encounter: Payer: Self-pay | Admitting: Family Medicine

## 2016-02-17 VITALS — BP 120/60 | HR 80 | Temp 97.8°F | Resp 16 | Ht 72.0 in | Wt 203.0 lb

## 2016-02-17 DIAGNOSIS — I2510 Atherosclerotic heart disease of native coronary artery without angina pectoris: Secondary | ICD-10-CM | POA: Diagnosis not present

## 2016-02-17 DIAGNOSIS — E0800 Diabetes mellitus due to underlying condition with hyperosmolarity without nonketotic hyperglycemic-hyperosmolar coma (NKHHC): Secondary | ICD-10-CM | POA: Diagnosis not present

## 2016-02-17 DIAGNOSIS — I2583 Coronary atherosclerosis due to lipid rich plaque: Secondary | ICD-10-CM

## 2016-02-17 DIAGNOSIS — I5020 Unspecified systolic (congestive) heart failure: Secondary | ICD-10-CM | POA: Diagnosis not present

## 2016-02-17 DIAGNOSIS — I10 Essential (primary) hypertension: Secondary | ICD-10-CM

## 2016-02-17 NOTE — Progress Notes (Signed)
Name: [name]   MRN: [id number]    DOB: 1941-02-19   Date:02/17/2016       Progress Note  Subjective  Chief Complaint  Chief Complaint  Patient presents with  . Diabetes    HPI Here for f/u of HBP.  Taking meds and feels pretty well excepth that he cannot walk far without stopping to rest.  He goes to [place] who control his DM and follow his heart.  No problem-specific Assessment & Plan notes found for this encounter.   Past Medical History:  Diagnosis Date  . Carcinoma of skin   . 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 Coronary artery disease 2005   CABG X 7 in 2005  . Diabetes mellitus without complication ([place])   . Diabetic retinopathy ([place])   . GERD (gastroesophageal reflux disease)   . Hypertension     Past Surgical History:  Procedure Laterality Date  . BACK SURGERY  07/2012  . CARDIAC CATHETERIZATION  2005  . CATARACT EXTRACTION  2014   right eye   . CORONARY ARTERY BYPASS GRAFT  2005    CABG x 7 at [place]     left knee    Family History  Problem Relation Age of Onset  . Hypertension Sister   . Hyperlipidemia Sister     Social History   Social History  . Marital status: Married    Spouse name: N/A  . Number of children: N/A  . Years of education: N/A   Occupational History  . Not on file.   Social History Main Topics  . Smoking status: Never Smoker  . Smokeless tobacco: Never Used  . Alcohol use 0.0 oz/week     Comment: occasional.  . Drug use: No  . Sexual activity: Not on file   Other Topics Concern  . Not on file   Social History Narrative  . No narrative on file     Current Outpatient Prescriptions:  .  acetaminophen (TYLENOL) 500 MG tablet, Take 1,000 mg by mouth every 6 (six) hours as needed for mild pain, fever or headache., Disp: , Rfl:  .  amitriptyline (ELAVIL) 10 MG tablet, Take 2 tablets (20 mg total) by mouth at bedtime., Disp: 180 tablet, Rfl: 3 .  amLODipine (NORVASC) 10 MG tablet, Take 1 tablet  (10 mg total) by mouth daily., Disp: 30 tablet, Rfl: 6 .  aspirin EC 81 MG tablet, Take 81 mg by mouth daily., Disp: , Rfl:  .  Cholecalciferol (VITAMIN D3) 400 units CAPS, Take 400 Units by mouth 2 (two) times daily., Disp: , Rfl:  .  finasteride (PROSCAR) 5 MG tablet, Take 1 tablet (5 mg total) by mouth daily., Disp: 30 tablet, Rfl: 3 .  furosemide (LASIX) 20 MG tablet, Take 1 tablet (20 mg total) by mouth daily. (Patient taking differently: Take 10 mg by mouth daily. ), Disp: 30 tablet, Rfl: 3 .  glipiZIDE (GLUCOTROL) 5 MG tablet, Take 10 mg by mouth 2 (two) times daily before a meal. , Disp: , Rfl: 0 .  glucose blood (PRECISION XTRA TEST STRIPS) test strip, 1 each by Other route as needed for other. Use as instructed, Disp: , Rfl:  .  insulin glargine (LANTUS) 100 UNIT/ML injection, Inject 0.2 mLs (20 Units total) into the skin at bedtime. (Patient taking differently: Inject 54 Units into the skin at bedtime. ), Disp: 10 mL, Rfl: 12 .  lisinopril (PRINIVIL,ZESTRIL) 20 MG tablet, Take 20 mg  by mouth daily., Disp: , Rfl:  .  losartan (COZAAR) 25 MG tablet, Take 1 tablet (25 mg total) by mouth daily., Disp: 30 tablet, Rfl: 6 .  omeprazole (PRILOSEC) 20 MG capsule, Take 20 mg by mouth daily. , Disp: , Rfl:  .  PRECISION THIN LANCETS MISC, 1 each by Does not apply route daily., Disp: , Rfl:  .  simvastatin (ZOCOR) 10 MG tablet, Take 1 tablet (10 mg total) by mouth daily at 6 PM., Disp: 90 tablet, Rfl: 3 .  tamsulosin (FLOMAX) 0.4 MG CAPS capsule, Take 1 capsule (0.4 mg total) by mouth daily., Disp: 30 capsule, Rfl: 3 .  Vitamin D, Ergocalciferol, (DRISDOL) 50000 units CAPS capsule, Take 50,000 Units by mouth every 7 (seven) days., Disp: , Rfl:  .  hydrochlorothiazide (HYDRODIURIL) 25 MG tablet, Take 12.5 mg by mouth daily., Disp: , Rfl:   Allergies  Allergen Reactions  . Ivp Dye [Iodinated Diagnostic Agents] Other (See Comments)    Reaction:  Unknown   . Codeine Nausea And Vomiting and Rash      Review of Systems  Constitutional: Positive for weight loss (desired). Negative for chills, fever and malaise/fatigue.  HENT: Negative for hearing loss.   Eyes: Negative for blurred vision and double vision.  Respiratory: Negative for cough, shortness of breath and wheezing.   Cardiovascular: Negative for chest pain, palpitations and leg swelling.  Gastrointestinal: Negative for abdominal pain, blood in stool and heartburn.  Genitourinary: Negative for dysuria, frequency and urgency.  Musculoskeletal: Positive for myalgias.  Skin: Positive for rash.  Neurological: Negative for dizziness, tremors, weakness and headaches.  Psychiatric/Behavioral: Negative for depression.      Objective  Vitals:   02/17/16 1102 02/17/16 1156  BP: 126/66 120/60  Pulse: (!) 46 80  Resp: 16   Temp: 97.8 F (36.6 C)   TempSrc: Oral   Weight: 203 lb (92.1 kg)   Height: 6' (1.829 m)     Physical Exam  Constitutional: He is oriented to person, place, and time and well-developed, well-nourished, and in no distress. No distress.  HENT:  Head: Normocephalic and atraumatic.  Eyes: Conjunctivae and EOM are normal. Pupils are equal, round, and reactive to light. No scleral icterus.  Neck: Normal range of motion. Neck supple. No thyromegaly present.  Cardiovascular: Normal rate, regular rhythm and normal heart sounds.   Occasional extrasystoles are present. Exam reveals no gallop and no friction rub.   No murmur heard. Pulmonary/Chest: No respiratory distress. He has no wheezes. He has no rales.  Abdominal: Soft. Bowel sounds are normal. He exhibits no distension and no mass. There is no tenderness.  Musculoskeletal: He exhibits no edema.  Lymphadenopathy:    He has no cervical adenopathy.  Neurological: He is alert and oriented to person, place, and time.  Vitals reviewed.      No results found for this or any previous visit (from the past 2160 hour(s)).   Assessment & Plan  Problem  List Items Addressed This Visit      Cardiovascular and Mediastinum   Coronary artery disease   Relevant Medications   aspirin EC 81 MG tablet   lisinopril (PRINIVIL,ZESTRIL) 20 MG tablet   hydrochlorothiazide (HYDRODIURIL) 25 MG tablet   Hypertension - Primary   Relevant Medications   aspirin EC 81 MG tablet   lisinopril (PRINIVIL,ZESTRIL) 20 MG tablet   hydrochlorothiazide (HYDRODIURIL) 25 MG tablet   CHF (congestive heart failure) (HCC)   Relevant Medications   aspirin EC 81 MG tablet  lisinopril (PRINIVIL,ZESTRIL) 20 MG tablet   hydrochlorothiazide (HYDRODIURIL) 25 MG tablet     Endocrine   Diabetes (HCC)   Relevant Medications   aspirin EC 81 MG tablet   lisinopril (PRINIVIL,ZESTRIL) 20 MG tablet    Other Visit Diagnoses   None.     Meds ordered this encounter  Medications  . aspirin EC 81 MG tablet    Sig: Take 81 mg by mouth daily.  Marland Kitchen glucose blood (PRECISION XTRA TEST STRIPS) test strip    Sig: 1 each by Other route as needed for other. Use as instructed  . PRECISION THIN LANCETS MISC    Sig: 1 each by Does not apply route daily.  Marland Kitchen lisinopril (PRINIVIL,ZESTRIL) 20 MG tablet    Sig: Take 20 mg by mouth daily.  . hydrochlorothiazide (HYDRODIURIL) 25 MG tablet    Sig: Take 12.5 mg by mouth daily.  . Vitamin D, Ergocalciferol, (DRISDOL) 50000 units CAPS capsule    Sig: Take 50,000 Units by mouth every 7 (seven) days.   1. Essential hypertension Cont meds  2. Systolic congestive heart failure, unspecified congestive heart failure chronicity ([place])  [facility] 3. Coronary artery disease due to lipid rich plaque   4. Diabetes mellitus due to underlying condition with hyperosmolarity without coma, without long-term current use of insulin ([place]) [facility].

## 2016-03-26 DIAGNOSIS — B351 Tinea unguium: Secondary | ICD-10-CM | POA: Diagnosis not present

## 2016-03-26 DIAGNOSIS — E119 Type 2 diabetes mellitus without complications: Secondary | ICD-10-CM | POA: Diagnosis not present

## 2016-03-26 DIAGNOSIS — L851 Acquired keratosis [keratoderma] palmaris et plantaris: Secondary | ICD-10-CM | POA: Diagnosis not present

## 2016-04-12 ENCOUNTER — Other Ambulatory Visit: Payer: Self-pay | Admitting: Family Medicine

## 2016-04-12 ENCOUNTER — Telehealth: Payer: Self-pay | Admitting: Family Medicine

## 2016-04-12 MED ORDER — OMEPRAZOLE 20 MG PO CPDR: 20.0000 mg | DELAYED_RELEASE_CAPSULE | Freq: Every day | ORAL | 12 refills | Status: AC

## 2016-04-12 NOTE — Telephone Encounter (Signed)
Pt. Called requesting a refill om omeprazole  10 mg states that he was taking it once a day.  [facility]  In [place]

## 2016-04-12 NOTE — Telephone Encounter (Signed)
Done.-jh 

## 2016-05-03 ENCOUNTER — Other Ambulatory Visit: Payer: Self-pay | Admitting: Family Medicine

## 2016-05-10 ENCOUNTER — Encounter: Payer: Self-pay | Admitting: *Deleted

## 2016-05-10 ENCOUNTER — Other Ambulatory Visit: Payer: Self-pay | Admitting: *Deleted

## 2016-05-10 ENCOUNTER — Telehealth: Payer: Self-pay | Admitting: Family Medicine

## 2016-05-10 DIAGNOSIS — I10 Essential (primary) hypertension: Secondary | ICD-10-CM

## 2016-05-10 MED ORDER — LOSARTAN POTASSIUM 25 MG PO TABS: 25.0000 mg | ORAL_TABLET | Freq: Every day | ORAL | 6 refills | Status: DC

## 2016-05-10 NOTE — Telephone Encounter (Signed)
Pt asked if he was supposed to be taking lisinopril and losartin, if so he needs refills sent to [facility] in [place].  Please call pt [phone]

## 2016-05-25 ENCOUNTER — Encounter: Payer: Self-pay | Admitting: Family Medicine

## 2016-05-25 ENCOUNTER — Ambulatory Visit (INDEPENDENT_AMBULATORY_CARE_PROVIDER_SITE_OTHER): Payer: PPO | Admitting: Family Medicine

## 2016-05-25 ENCOUNTER — Encounter: Payer: Self-pay | Admitting: *Deleted

## 2016-05-25 ENCOUNTER — Other Ambulatory Visit: Payer: Self-pay | Admitting: *Deleted

## 2016-05-25 VITALS — BP 130/60 | HR 77 | Temp 97.5°F | Resp 16 | Ht 72.0 in | Wt 198.0 lb

## 2016-05-25 DIAGNOSIS — Z794 Long term (current) use of insulin: Secondary | ICD-10-CM | POA: Diagnosis not present

## 2016-05-25 DIAGNOSIS — I2583 Coronary atherosclerosis due to lipid rich plaque: Secondary | ICD-10-CM

## 2016-05-25 DIAGNOSIS — I5020 Unspecified systolic (congestive) heart failure: Secondary | ICD-10-CM

## 2016-05-25 DIAGNOSIS — E119 Type 2 diabetes mellitus without complications: Secondary | ICD-10-CM

## 2016-05-25 DIAGNOSIS — I10 Essential (primary) hypertension: Secondary | ICD-10-CM | POA: Diagnosis not present

## 2016-05-25 DIAGNOSIS — I2510 Atherosclerotic heart disease of native coronary artery without angina pectoris: Secondary | ICD-10-CM | POA: Diagnosis not present

## 2016-05-25 MED ORDER — AMLODIPINE BESYLATE 10 MG PO TABS: 10.0000 mg | ORAL_TABLET | Freq: Every day | ORAL | 3 refills | Status: AC

## 2016-05-25 NOTE — Progress Notes (Signed)
Name: [name]   MRN: [id number]    DOB: 01/29/1941   Date:05/25/2016       Progress Note  Subjective  Chief Complaint  Chief Complaint  Patient presents with  . Hypertension    HPI  Here for f/u of HBP.  Also with DM.  He very rarely checks BSs at home.  Just does not want to check B Ss.  He has lost some weight. He just does not want to do things that he needs to do to keep BS controlled. No problem-specific Assessment & Plan notes found for this encounter.   Past Medical History:  Diagnosis Date  . Carcinoma of skin   . Cauda equina compression ([place])   . Coronary artery disease 2005   CABG X 7 in 2005  . Diabetes mellitus without complication ([place])   . Diabetic retinopathy ([place])   . GERD (gastroesophageal reflux disease)   . Hypertension     Past Surgical History:  Procedure Laterality Date  . BACK SURGERY  07/2012  . CARDIAC CATHETERIZATION  2005  . CATARACT EXTRACTION  2014   right eye   . CORONARY ARTERY BYPASS GRAFT  2005    CABG x 7 at [place]     left knee    Family History  Problem Relation Age of Onset  . Hypertension Sister   . Hyperlipidemia Sister     Social History   Social History  . Marital status: Married    Spouse name: N/A  . Number of children: N/A  . Years of education: N/A   Occupational History  . Not on file.   Social History Main Topics  . Smoking status: Never Smoker  . Smokeless tobacco: Never Used  . Alcohol use 0.0 oz/week     Comment: occasional.  . Drug use: No  . Sexual activity: Not on file   Other Topics Concern  . Not on file   Social History Narrative  . No narrative on file     Current Outpatient Prescriptions:  .  acetaminophen (TYLENOL) 500 MG tablet, Take 1,000 mg by mouth every 6 (six) hours as needed for mild pain, fever or headache., Disp: , Rfl:  .  amitriptyline (ELAVIL) 10 MG tablet, Take 2 tablets (20 mg total) by mouth at bedtime., Disp: 180 tablet, Rfl: 3 .   amLODipine (NORVASC) 10 MG tablet, Take 1 tablet (10 mg total) by mouth daily., Disp: 90 tablet, Rfl: 3 .  aspirin EC 81 MG tablet, Take 81 mg by mouth daily., Disp: , Rfl:  .  Cholecalciferol (VITAMIN D3) 400 units CAPS, Take 400 Units by mouth 2 (two) times daily., Disp: , Rfl:  .  finasteride (PROSCAR) 5 MG tablet, Take 1 tablet (5 mg total) by mouth daily., Disp: 30 tablet, Rfl: 3 .  furosemide (LASIX) 20 MG tablet, Take 1 tablet (20 mg total) by mouth daily. (Patient taking differently: Take 10 mg by mouth daily. ), Disp: 30 tablet, Rfl: 3 .  glipiZIDE (GLUCOTROL) 5 MG tablet, take 2 tablets by mouth twice a day before meals, Disp: 120 tablet, Rfl: 4 .  glucose blood (PRECISION XTRA TEST STRIPS) test strip, 1 each by Other route as needed for other. Use as instructed, Disp: , Rfl:  .  hydrochlorothiazide (HYDRODIURIL) 25 MG tablet, Take 12.5 mg by mouth daily., Disp: , Rfl:  .  insulin glargine (LANTUS) 100 UNIT/ML injection, Inject 0.2 mLs (20 Units total) into the skin  at bedtime. (Patient taking differently: Inject 54 Units into the skin at bedtime. ), Disp: 10 mL, Rfl: 12 .  losartan (COZAAR) 25 MG tablet, Take 1 tablet (25 mg total) by mouth daily., Disp: 30 tablet, Rfl: 6 .  omeprazole (PRILOSEC) 20 MG capsule, Take 1 capsule (20 mg total) by mouth daily., Disp: 30 capsule, Rfl: 12 .  PRECISION THIN LANCETS MISC, 1 each by Does not apply route daily., Disp: , Rfl:  .  simvastatin (ZOCOR) 10 MG tablet, Take 1 tablet (10 mg total) by mouth daily at 6 PM., Disp: 90 tablet, Rfl: 3 .  tamsulosin (FLOMAX) 0.4 MG CAPS capsule, Take 1 capsule (0.4 mg total) by mouth daily., Disp: 30 capsule, Rfl: 3  Allergies  Allergen Reactions  . Ivp Dye [Iodinated Diagnostic Agents] Other (See Comments)    Reaction:  Unknown   . Codeine Nausea And Vomiting and Rash     Review of Systems  Constitutional: Negative for chills, fever, malaise/fatigue and weight loss.  HENT: Negative for hearing loss and  tinnitus.   Eyes: Negative for blurred vision and double vision.  Respiratory: Negative for cough, shortness of breath and wheezing.   Cardiovascular: Negative for chest pain, palpitations and leg swelling.  Gastrointestinal: Negative for abdominal pain, blood in stool and heartburn.  Genitourinary: Negative for dysuria, frequency and urgency.  Musculoskeletal: Negative for myalgias.  Skin: Negative for rash.  Neurological: Negative for dizziness, tingling, tremors, weakness and headaches.      Objective  Vitals:   05/25/16 1117 05/25/16 1158  BP: (!) 146/61 130/60  Pulse: 77   Resp: 16   Temp: 97.5 F (36.4 C)   TempSrc: Oral   Weight: 198 lb (89.8 kg)   Height: 6' (1.829 m)     Physical Exam  Constitutional: He is oriented to person, place, and time and well-developed, well-nourished, and in no distress. No distress.  HENT:  Head: Normocephalic and atraumatic.  Eyes: Conjunctivae and EOM are normal. Pupils are equal, round, and reactive to light. No scleral icterus.  Neck: Normal range of motion. Neck supple. Carotid bruit is not present. No thyromegaly present.  Cardiovascular: Normal rate, regular rhythm and normal heart sounds.  Exam reveals no gallop and no friction rub.   No murmur heard. Pulmonary/Chest: Effort normal and breath sounds normal. No respiratory distress. He has no wheezes. He has no rales.  Musculoskeletal: He exhibits no edema.  Lymphadenopathy:    He has no cervical adenopathy.  Neurological: He is alert and oriented to person, place, and time.  Vitals reviewed.      No results found for this or any previous visit (from the past 2160 hour(s)).   Assessment & Plan  Problem List Items Addressed This Visit      Cardiovascular and Mediastinum   Coronary artery disease   Relevant Medications   amLODipine (NORVASC) 10 MG tablet   Hypertension - Primary   Relevant Medications   amLODipine (NORVASC) 10 MG tablet   CHF (congestive heart  failure) (HCC)   Relevant Medications   amLODipine (NORVASC) 10 MG tablet     Endocrine   Type 2 diabetes mellitus without complications ([place])      Meds ordered this encounter  Medications  . amLODipine (NORVASC) 10 MG tablet    Sig: Take 1 tablet (10 mg total) by mouth daily.    Dispense:  90 tablet    Refill:  3  1. Essential [facility]  2. Coronary artery disease due to lipid rich plaque Cont meds  3. Systolic congestive heart failure, unspecified congestive heart failure chronicity ([place])   4. Type 2 diabetes mellitus without complication, with long-term current use of insulin (HCC) Cont meds Followed by [facility] also

## 2016-06-27 ENCOUNTER — Other Ambulatory Visit: Payer: Self-pay | Admitting: Family Medicine

## 2016-06-27 DIAGNOSIS — I2510 Atherosclerotic heart disease of native coronary artery without angina pectoris: Secondary | ICD-10-CM

## 2016-07-14 DIAGNOSIS — E119 Type 2 diabetes mellitus without complications: Secondary | ICD-10-CM | POA: Diagnosis not present

## 2016-07-14 DIAGNOSIS — L851 Acquired keratosis [keratoderma] palmaris et plantaris: Secondary | ICD-10-CM | POA: Diagnosis not present

## 2016-07-14 DIAGNOSIS — B351 Tinea unguium: Secondary | ICD-10-CM | POA: Diagnosis not present

## 2016-08-26 ENCOUNTER — Ambulatory Visit: Payer: PPO | Admitting: Family Medicine

## 2016-09-02 ENCOUNTER — Encounter: Payer: Self-pay | Admitting: Family Medicine

## 2016-09-02 ENCOUNTER — Ambulatory Visit (INDEPENDENT_AMBULATORY_CARE_PROVIDER_SITE_OTHER): Payer: PPO | Admitting: Family Medicine

## 2016-09-02 VITALS — BP 145/60 | HR 80 | Temp 98.4°F | Resp 16 | Ht 72.0 in | Wt 208.0 lb

## 2016-09-02 DIAGNOSIS — I2583 Coronary atherosclerosis due to lipid rich plaque: Secondary | ICD-10-CM

## 2016-09-02 DIAGNOSIS — N179 Acute kidney failure, unspecified: Secondary | ICD-10-CM

## 2016-09-02 DIAGNOSIS — I5020 Unspecified systolic (congestive) heart failure: Secondary | ICD-10-CM

## 2016-09-02 DIAGNOSIS — I10 Essential (primary) hypertension: Secondary | ICD-10-CM

## 2016-09-02 DIAGNOSIS — E0800 Diabetes mellitus due to underlying condition with hyperosmolarity without nonketotic hyperglycemic-hyperosmolar coma (NKHHC): Secondary | ICD-10-CM | POA: Diagnosis not present

## 2016-09-02 DIAGNOSIS — I2510 Atherosclerotic heart disease of native coronary artery without angina pectoris: Secondary | ICD-10-CM | POA: Diagnosis not present

## 2016-09-02 NOTE — Progress Notes (Signed)
Name: [name]   MRN: [id number]    DOB: [birth year]   Date:09/02/2016       Progress Note  Subjective  Chief Complaint  Chief Complaint  Patient presents with  . Hypertension  . Diabetes  . Hyperlipidemia    HPI Here for f/u of HBP.  He is also diabetic and has elevated lipids.  [facility]  And BS seems to b e doing fair, but his insulin has been increased to 60 units daily.  His BP is up today.   Has  Had skin lesion removed from his shoulder.  Awaiting path report.  No problem-specific Assessment & Plan notes found for this encounter.   Past Medical History:  Diagnosis Date  . Carcinoma of skin   . Cauda equina compression ([place])   . Coronary artery disease 2005   CABG X 7 in 2005  . Diabetes mellitus without complication ([place])   . Diabetic retinopathy ([place])   . GERD (gastroesophageal reflux disease)   . Hypertension     Past Surgical History:  Procedure Laterality Date  . BACK SURGERY  07/2012  . CARDIAC CATHETERIZATION  2005  . CATARACT EXTRACTION  2014   right eye   . CORONARY ARTERY BYPASS GRAFT  2005    CABG x 7 at [place]     left knee    Family History  Problem Relation Age of Onset  . Hypertension Sister   . Hyperlipidemia Sister     Social History   Social History  . Marital status: Married    Spouse name: N/A  . Number of children: N/A  . Years of education: N/A   Occupational History  . Not on file.   Social History Main Topics  . Smoking status: Never Smoker  . Smokeless tobacco: Never Used  . Alcohol use 0.0 oz/week     Comment: occasional.  . Drug use: No  . Sexual activity: Not on file   Other Topics Concern  . Not on file   Social History Narrative  . No narrative on file     Current Outpatient Prescriptions:  .  acetaminophen (TYLENOL) 500 MG tablet, Take 1,000 mg by mouth every 6 (six) hours as needed for mild pain, fever or headache., Disp: , Rfl:  .  amitriptyline (ELAVIL)  10 MG tablet, Take 2 tablets (20 mg total) by mouth at bedtime., Disp: 180 tablet, Rfl: 3 .  amLODipine (NORVASC) 10 MG tablet, Take 1 tablet (10 mg total) by mouth daily., Disp: 90 tablet, Rfl: 3 .  aspirin EC 81 MG tablet, Take 81 mg by mouth daily., Disp: , Rfl:  .  glipiZIDE (GLUCOTROL) 5 MG tablet, take 2 tablets by mouth twice a day before meals, Disp: 120 tablet, Rfl: 4 .  glucose blood (PRECISION XTRA TEST STRIPS) test strip, 1 each by Other route as needed for other. Use as instructed, Disp: , Rfl:  .  Insulin Glargine (LANTUS) 100 UNIT/ML Solostar Pen, Inject 60 Units into the skin daily at 10 pm., Disp: , Rfl:  .  losartan (COZAAR) 25 MG tablet, Take 1 tablet (25 mg total) by mouth daily., Disp: 30 tablet, Rfl: 6 .  omeprazole (PRILOSEC) 20 MG capsule, Take 1 capsule (20 mg total) by mouth daily., Disp: 30 capsule, Rfl: 12 .  PRECISION THIN LANCETS MISC, 1 each by Does not apply route daily., Disp: , Rfl:  .  simvastatin (ZOCOR) 10  MG tablet, take 1 tablet by mouth once daily AT 6PM, Disp: 90 tablet, Rfl: 3 .  tamsulosin (FLOMAX) 0.4 MG CAPS capsule, Take 1 capsule (0.4 mg total) by mouth daily., Disp: 30 capsule, Rfl: 3  Allergies  Allergen Reactions  . Fluorescein     Other reaction(s): Nausea And Vomiting  . Ivp Dye [Iodinated Diagnostic Agents] Other (See Comments)    Reaction:  Unknown   . Morphine Nausea Only  . Codeine Nausea And Vomiting and Rash     Review of Systems  Constitutional: Negative for chills, fever, malaise/fatigue and weight loss.  HENT: Negative for hearing loss and tinnitus.   Eyes: Negative for blurred vision and double vision.  Respiratory: Negative for cough, shortness of breath and wheezing.   Cardiovascular: Negative for chest pain, palpitations and leg swelling.  Gastrointestinal: Negative for abdominal pain, blood in stool and heartburn.  Genitourinary: Negative for dysuria, frequency and urgency.  Musculoskeletal: Negative for back pain, joint  pain and myalgias.  Skin: Negative for rash.  Neurological: Negative for dizziness, tingling, tremors, weakness and headaches.      Objective  Vitals:   09/02/16 1056 09/02/16 1146  BP: (!) 155/80 (!) 145/60  Pulse: 80   Resp: 16   Temp: 98.4 F (36.9 C)   TempSrc: Oral   Weight: 208 lb (94.3 kg)   Height: 6' (1.829 m)     Physical Exam  Constitutional: He is oriented to person, place, and time and well-developed, well-nourished, and in no distress. No distress.  HENT:  Head: Normocephalic and atraumatic.  Eyes: Conjunctivae and EOM are normal. Pupils are equal, round, and reactive to light. No scleral icterus.  Neck: Normal range of motion. Neck supple. No thyromegaly present.  Cardiovascular: Normal rate, regular rhythm and normal heart sounds.  Exam reveals no gallop and no friction rub.   No murmur heard. Pulmonary/Chest: Effort normal and breath sounds normal. No respiratory distress. He has no wheezes. He has no rales.  Musculoskeletal: He exhibits no edema.  Lymphadenopathy:    He has no cervical adenopathy.  Neurological: He is alert and oriented to person, place, and time.  Vitals reviewed.      No results found for this or any previous visit (from the past 2160 hour(s)).   Assessment & Plan  Problem List Items Addressed This Visit      Cardiovascular and Mediastinum   Coronary artery disease   Hypertension - Primary   CHF (congestive heart failure) (HCC)     Endocrine   Diabetes ([place])   Relevant Medications   Insulin Glargine (LANTUS) 100 UNIT/ML Solostar Pen     Genitourinary   Acute kidney failure ([place])      Meds ordered this encounter  Medications  . DISCONTD: Insulin Glargine (LANTUS) 100 UNIT/ML Solostar Pen    Sig: Inject 60 Units into the skin at bedtime.  . Insulin Glargine (LANTUS) 100 UNIT/ML Solostar Pen    Sig: Inject 60 Units into the skin daily at 10 pm.   1. Essential hypertension Cont meds  2. Systolic congestive heart  failure, unspecified congestive heart failure chronicity (HCC) Cont meds  3. Coronary artery disease due to lipid rich plaque  Cont meds 4. Diabetes mellitus due to underlying condition with hyperosmolarity without coma, without long-term current use of insulin (HCC)  Cont meds 5. Acute renal failure, unspecified acute renal failure type ([place])

## 2016-10-13 DIAGNOSIS — E119 Type 2 diabetes mellitus without complications: Secondary | ICD-10-CM | POA: Diagnosis not present

## 2016-10-13 DIAGNOSIS — B351 Tinea unguium: Secondary | ICD-10-CM | POA: Diagnosis not present

## 2016-10-13 DIAGNOSIS — L851 Acquired keratosis [keratoderma] palmaris et plantaris: Secondary | ICD-10-CM | POA: Diagnosis not present

## 2016-12-21 ENCOUNTER — Ambulatory Visit: Payer: PPO

## 2016-12-31 ENCOUNTER — Telehealth: Payer: Self-pay

## 2016-12-31 NOTE — Telephone Encounter (Signed)
Called to r/s AWV. No answer

## 2017-01-07 NOTE — Telephone Encounter (Signed)
Called to reschedule AWV, pt declined visit and also requested to cancel upcoming follow up appt with Dr.Karamalegos. He states he is doing all his follow ups at the [place] and will call if he needs any further appts with [facility]

## 2017-01-10 ENCOUNTER — Ambulatory Visit: Payer: PPO | Admitting: Family Medicine

## 2017-01-12 DIAGNOSIS — E119 Type 2 diabetes mellitus without complications: Secondary | ICD-10-CM | POA: Diagnosis not present

## 2017-01-12 DIAGNOSIS — B351 Tinea unguium: Secondary | ICD-10-CM | POA: Diagnosis not present

## 2017-01-12 DIAGNOSIS — L851 Acquired keratosis [keratoderma] palmaris et plantaris: Secondary | ICD-10-CM | POA: Diagnosis not present

## 2017-04-14 DIAGNOSIS — L851 Acquired keratosis [keratoderma] palmaris et plantaris: Secondary | ICD-10-CM | POA: Diagnosis not present

## 2017-04-14 DIAGNOSIS — B351 Tinea unguium: Secondary | ICD-10-CM | POA: Diagnosis not present

## 2017-04-14 DIAGNOSIS — E119 Type 2 diabetes mellitus without complications: Secondary | ICD-10-CM | POA: Diagnosis not present

## 2017-07-15 DIAGNOSIS — E1142 Type 2 diabetes mellitus with diabetic polyneuropathy: Secondary | ICD-10-CM | POA: Diagnosis not present

## 2017-07-15 DIAGNOSIS — B351 Tinea unguium: Secondary | ICD-10-CM | POA: Diagnosis not present

## 2017-07-15 DIAGNOSIS — L851 Acquired keratosis [keratoderma] palmaris et plantaris: Secondary | ICD-10-CM | POA: Diagnosis not present

## 2017-10-17 DIAGNOSIS — R339 Retention of urine, unspecified: Secondary | ICD-10-CM | POA: Diagnosis not present

## 2017-12-20 IMAGING — CR DG CHEST 2V
2 series · 2 of 2 positions shown · non-contrast
Comparison: 03/10/2005.

CLINICAL DATA: 75-year-old male with fever cough and generalized
weakness for 3 days. Currently febrile. Initial encounter.

EXAM:
CHEST  2 VIEW

[chest pa]
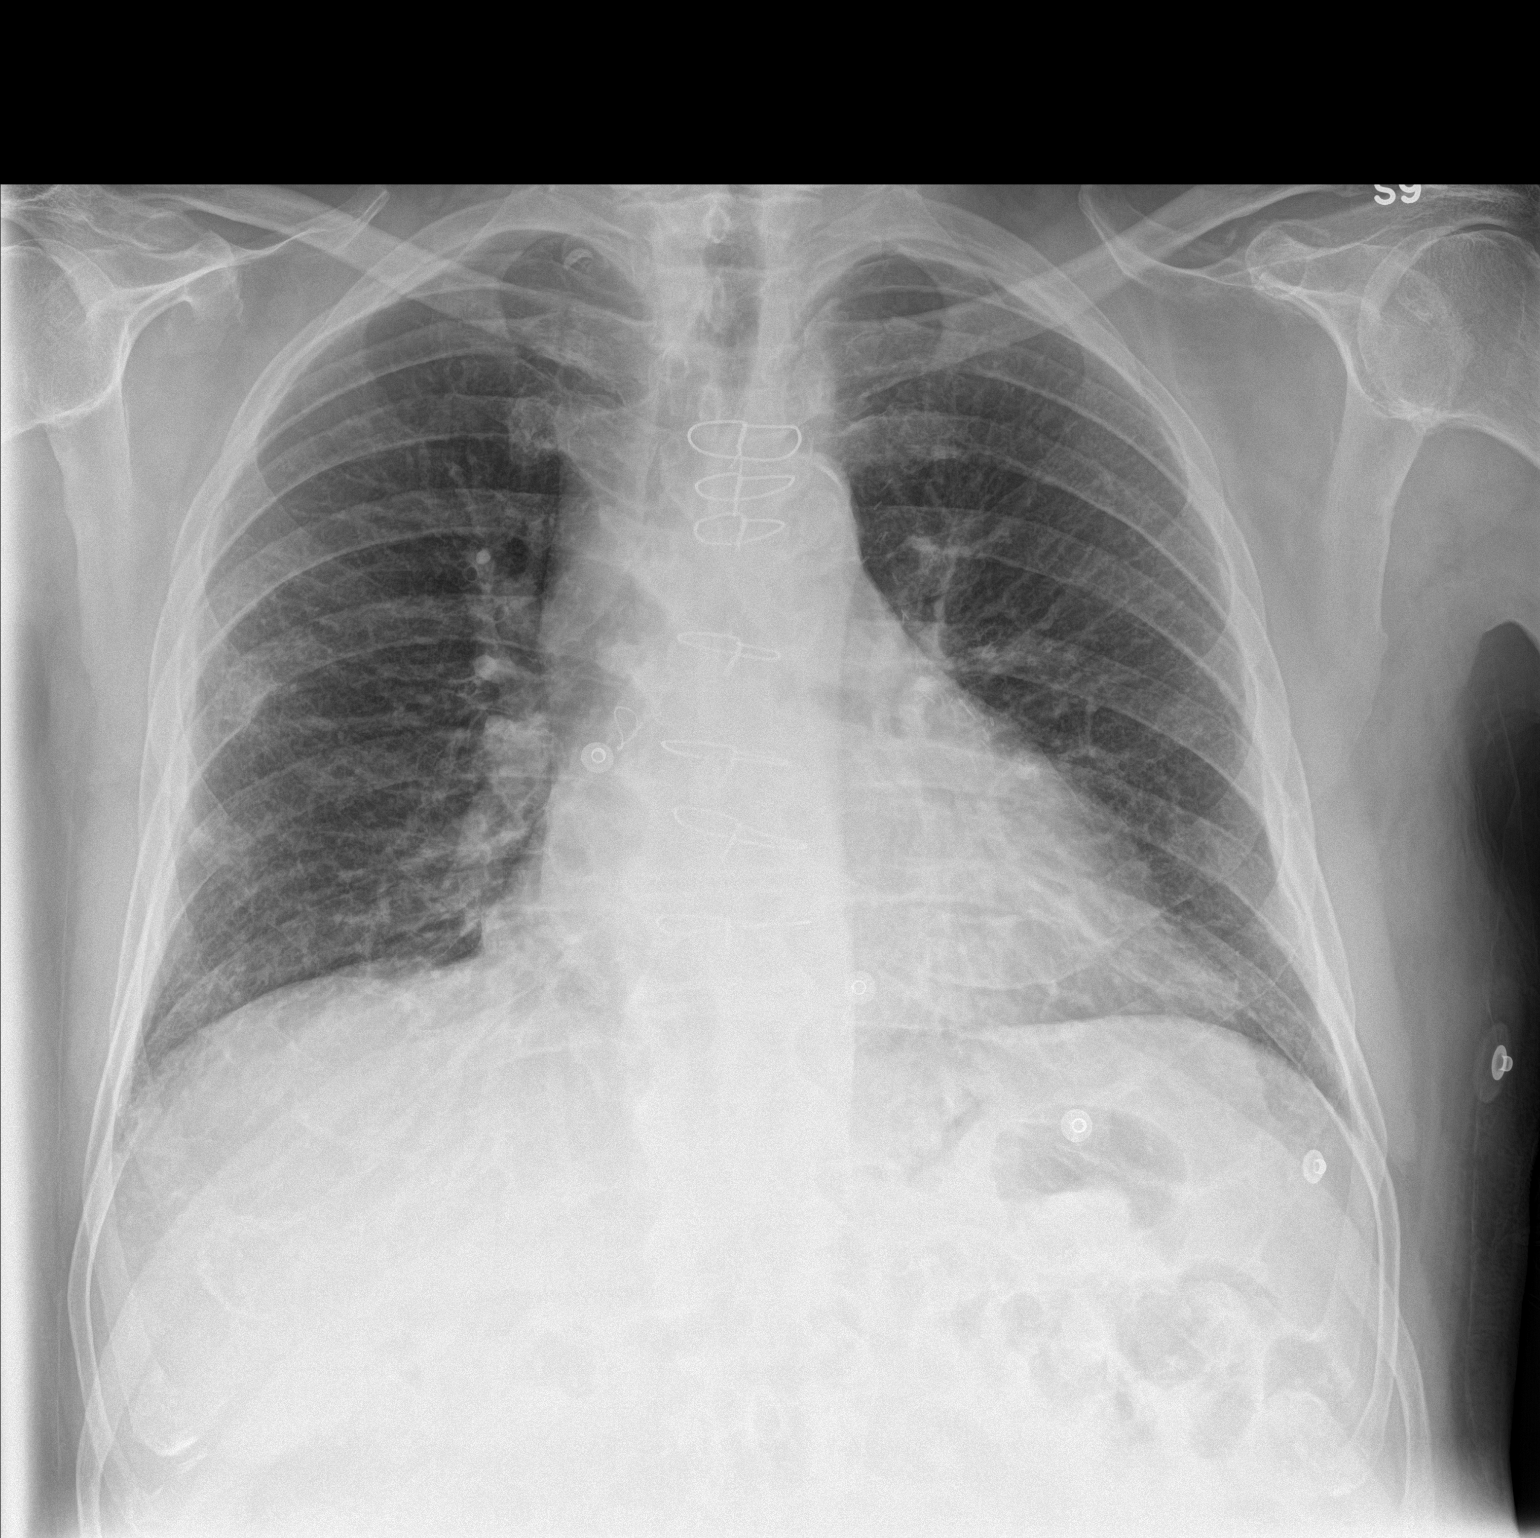

[chest lat]
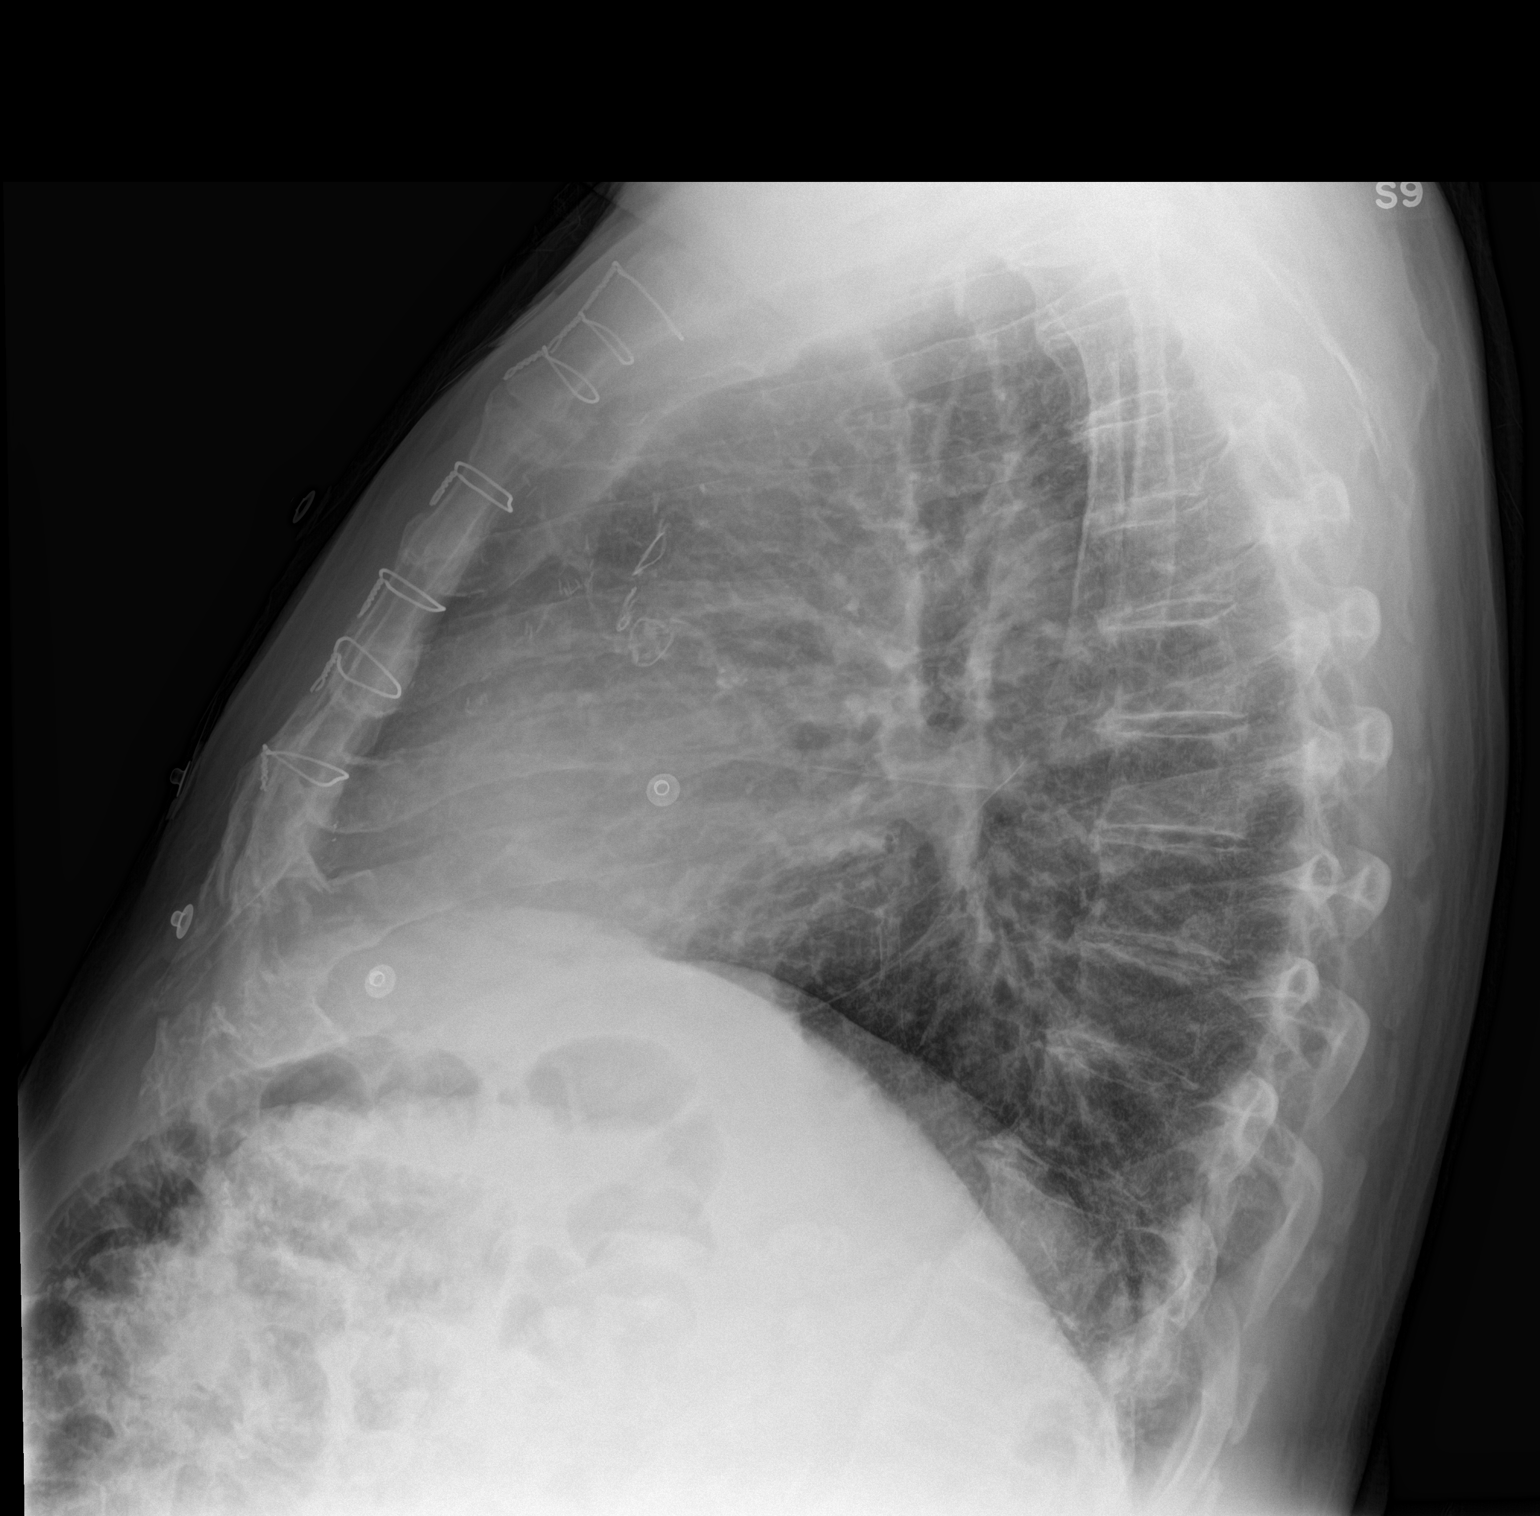

[2 of 2 positions shown; findings below may reference images not displayed]

FINDINGS: Sequelae of CABG again noted. Stable lung volumes with no
pneumothorax, pulmonary edema, pleural effusion or consolidation. No
acute or confluent pulmonary opacity. Chronic right lateral seventh
and eighth rib fractures with mild opacity appear unchanged.
Negative visible bowel gas pattern. Osteopenia. No acute osseous
abnormality identified.

Calcified aortic atherosclerosis.
IMPRESSION: No acute cardiopulmonary abnormality.

## 2017-12-22 IMAGING — CR DG ABDOMEN 1V
1 series · 2 of 2 positions shown · non-contrast
Comparison: None.

CLINICAL DATA: 75-year-old male with abdominal distension

EXAM:
ABDOMEN - 1 VIEW

[Series 1: ap · 0.17mm/px · 2 of 2 slices shown]
[im 1/2]
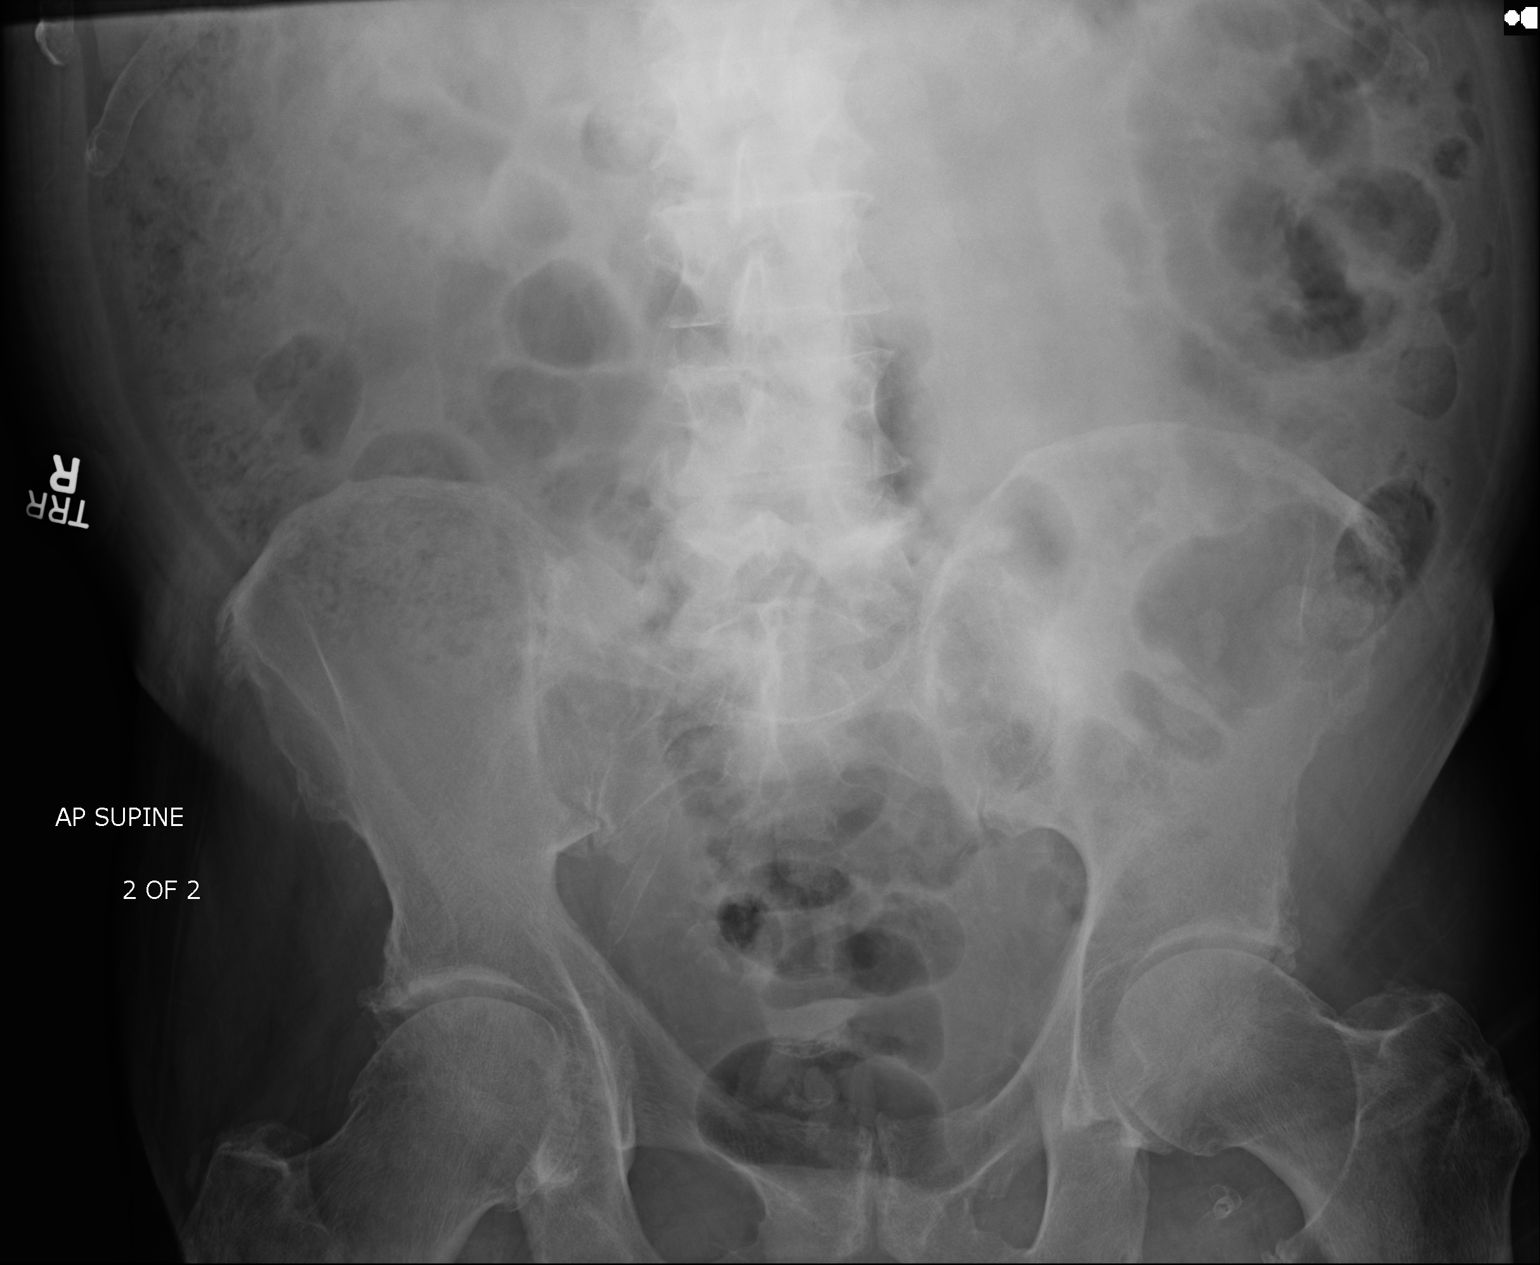
[im 2/2]
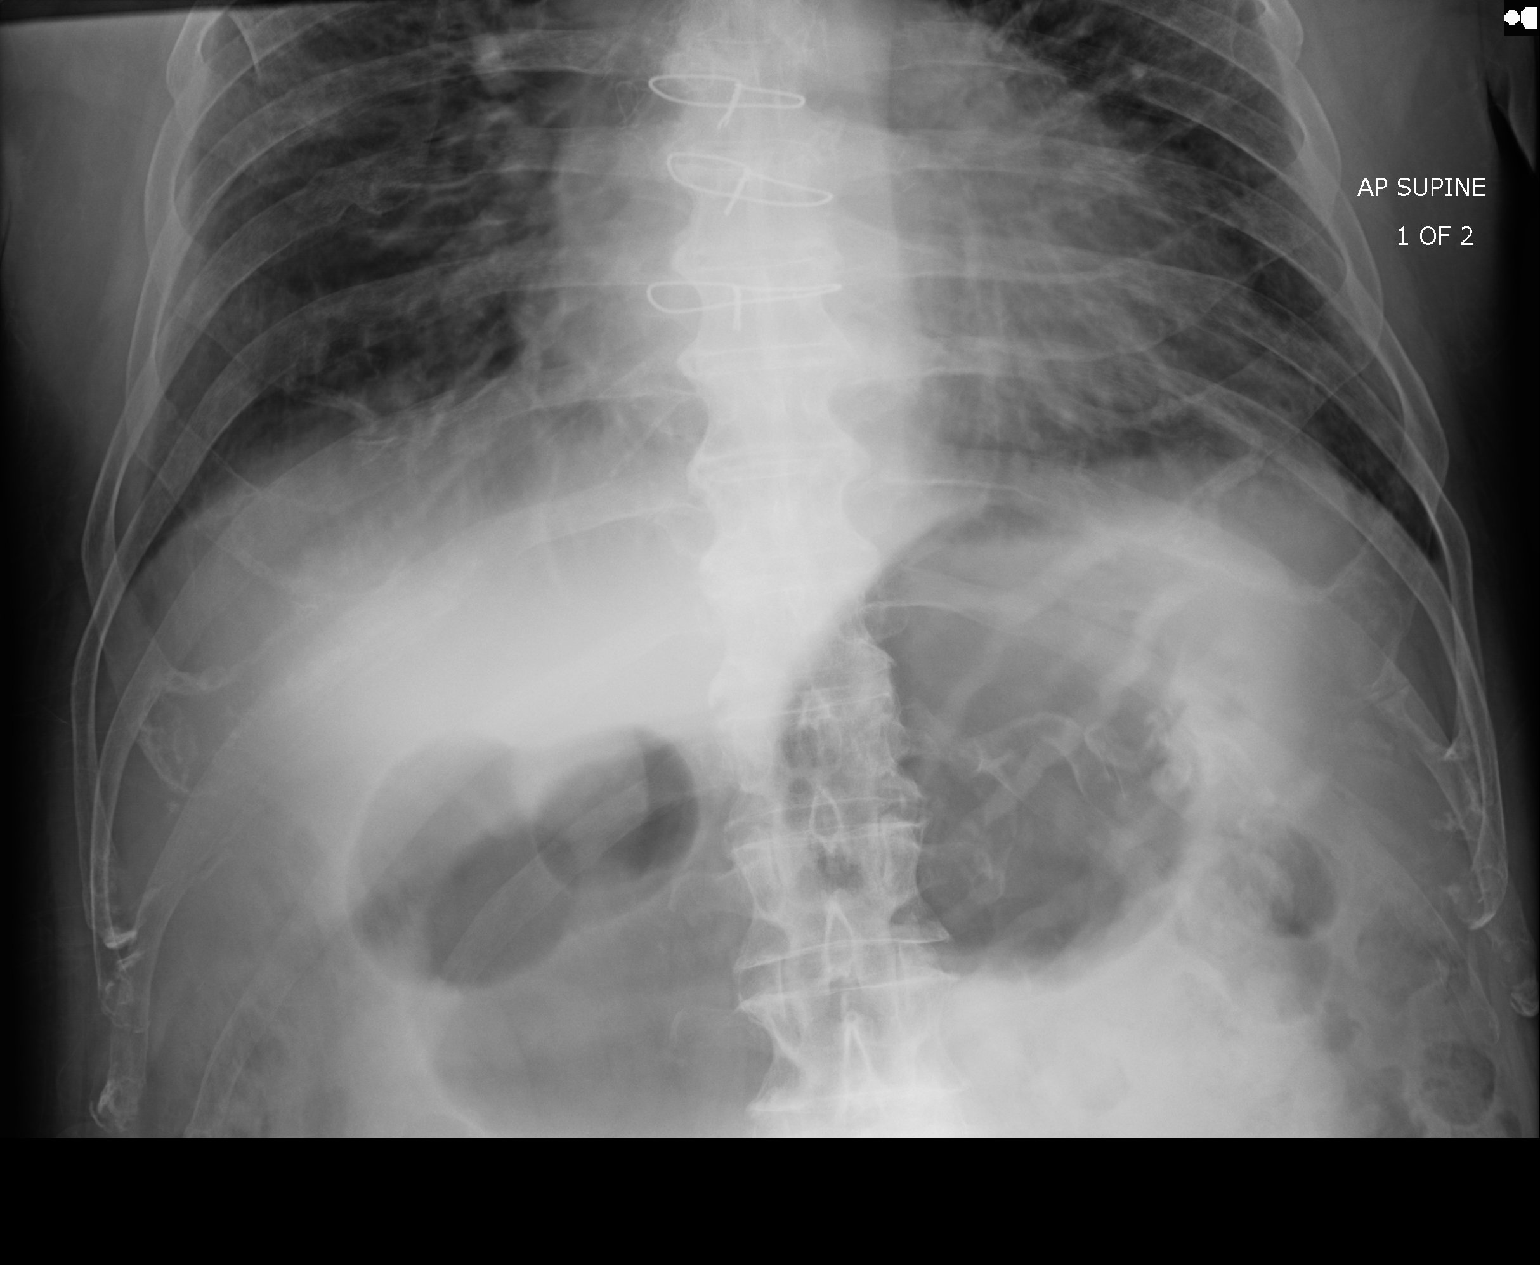

[2 of 2 positions shown; findings below may reference images not displayed]

FINDINGS: Mild air distention of the stomach. Air is noted throughout the
small bowel. There is an elongated/tubular structure with
surrounding area of hazy density in the left lower abdomen to the
left of the lower lumbar spine concerning for an inflamed loop of
bowel. There is apparent mass effect and displacement of the
adjacent bowel loops laterally. CT is recommended for further
evaluation. Moderate stool noted throughout the colon. No free air.
No radiopaque calculi.

There is degenerative changes of the spine and hip joints. Age
indeterminate fracture of the left superior pubic ramus. Clinical
correlation is recommended. Median sternotomy wires noted.
IMPRESSION: Thickened appearing tubular structure in the left lower abdomen
concerning for an inflamed loop of bowel. CT is recommended for
further evaluation.

Age indeterminate fracture of left superior pubic ramus. Clinical
correlation is recommended.

## 2018-01-23 IMAGING — CT CT ABD-PELV W/O CM
1 of 2 series · 14 of 32 positions shown, 18 images · non-contrast
Comparison: Plain films of 07/31/2015

CLINICAL DATA: Left lower quadrant abdominal mass on x-ray. History
of skin cancer, appendectomy. No current pain, nausea, vomiting, or
diarrhea.

EXAM:
CT ABDOMEN AND PELVIS WITHOUT CONTRAST
TECHNIQUE: Multidetector CT imaging of the abdomen and pelvis was performed
following the standard protocol without IV contrast.

[Series 2: axial st · axial · 0.85mm/px · z∈[-1052,-597]mm · 14 of 101 slices shown, 18 images]
[im 5/101  soft-tissue]
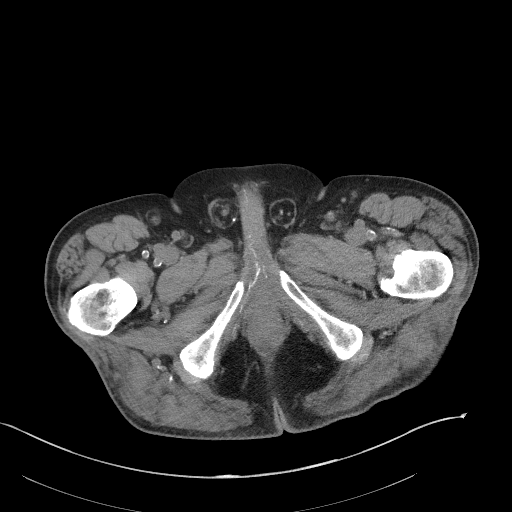
[im 5/101  bone]
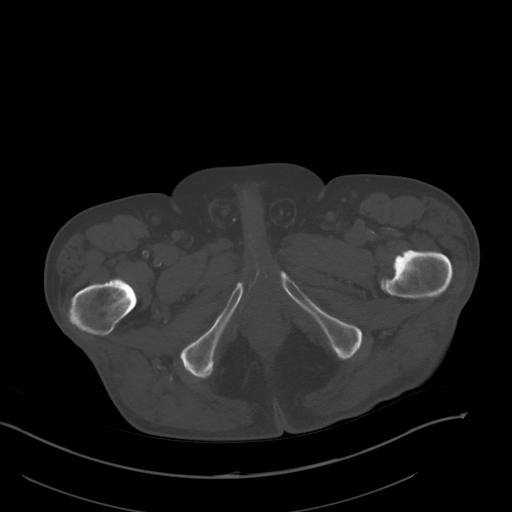
[im 13/101  soft-tissue]
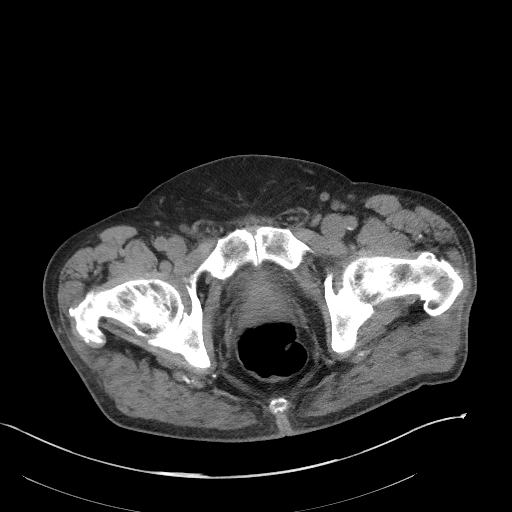
[im 21/101  soft-tissue]
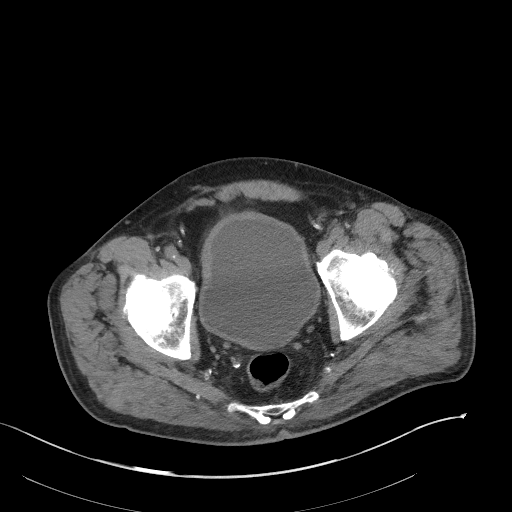
[im 30/101  soft-tissue]
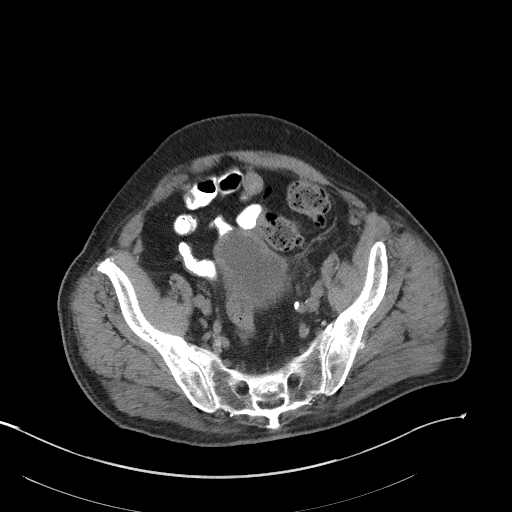
[im 38/101  soft-tissue]
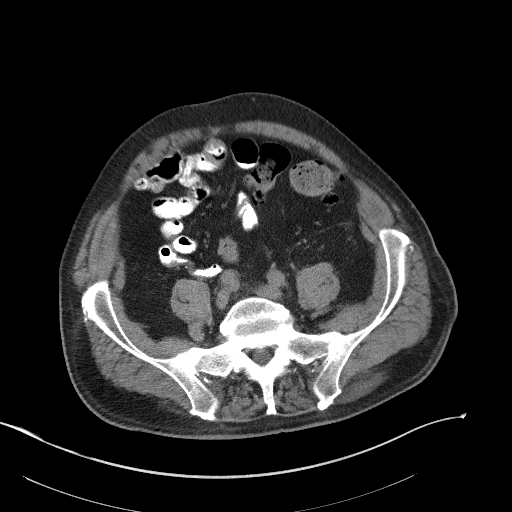
[im 46/101  soft-tissue]
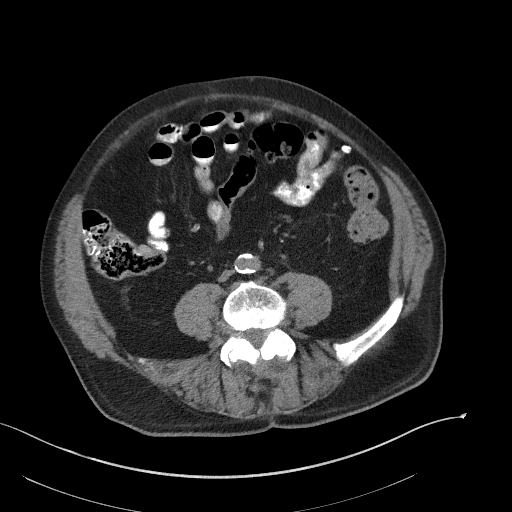
[im 55/101  soft-tissue]
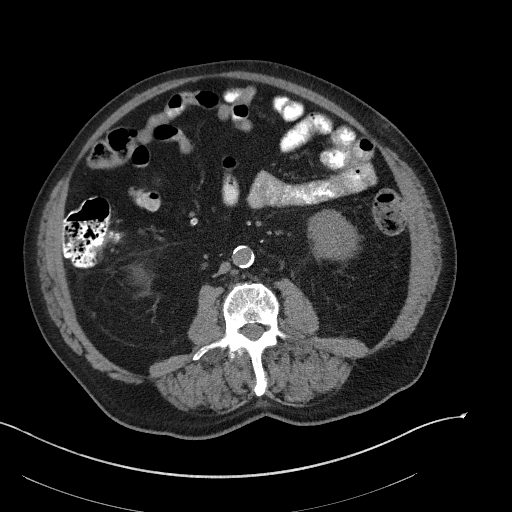
[im 63/101  soft-tissue]
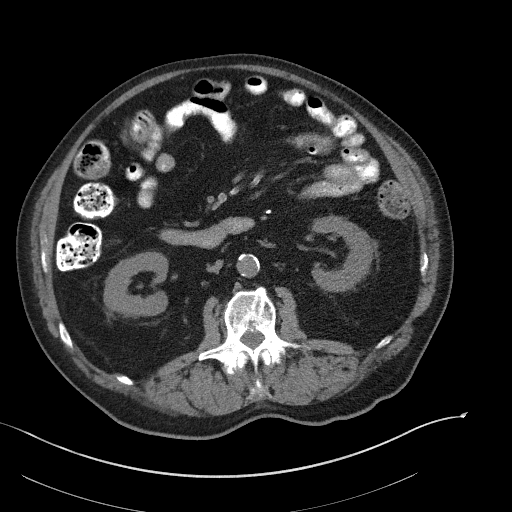
[im 71/101  soft-tissue]
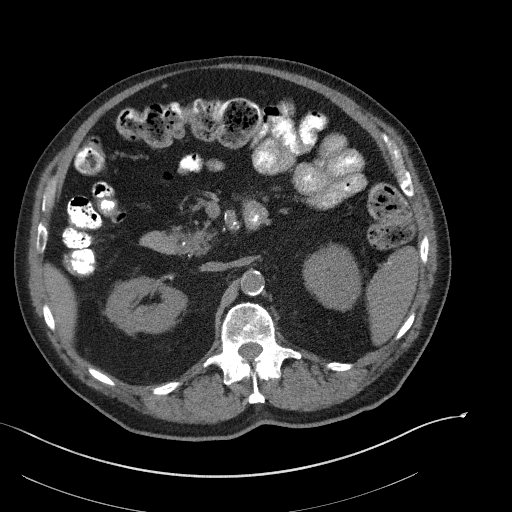
[im 71/101  bone]
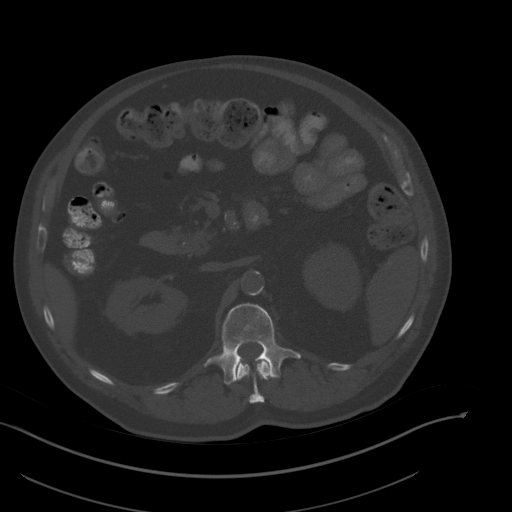
[im 80/101  soft-tissue]
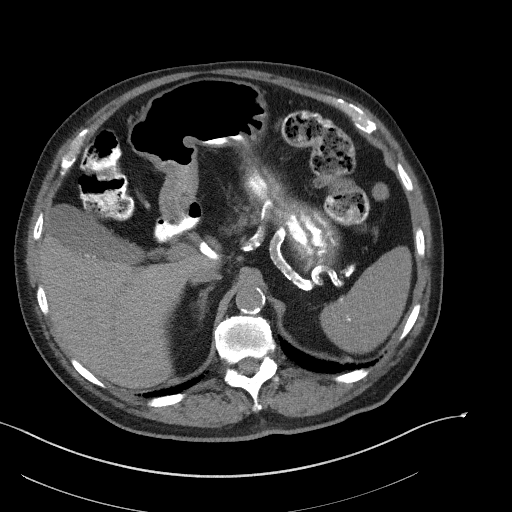
[im 84/101  lung]
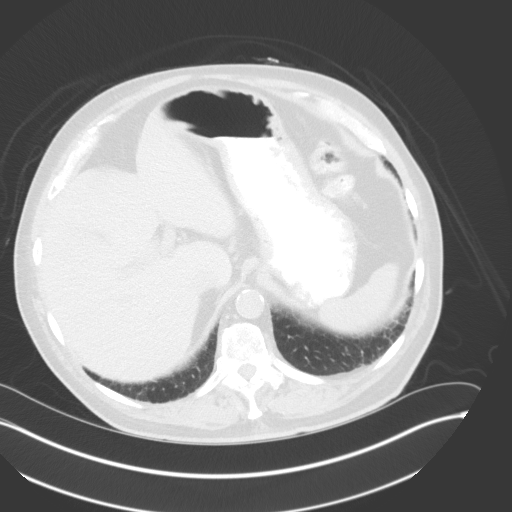
[im 88/101  soft-tissue]
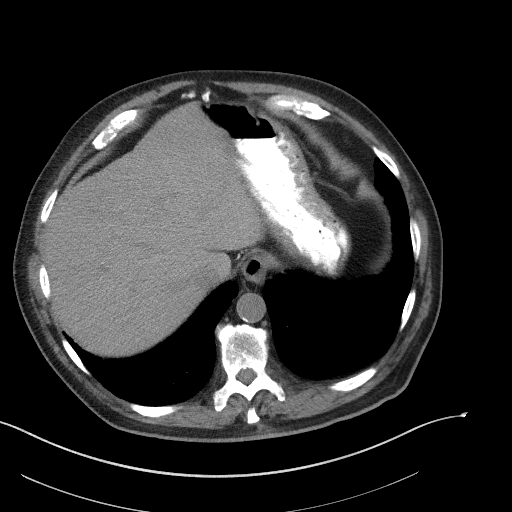
[im 88/101  lung]
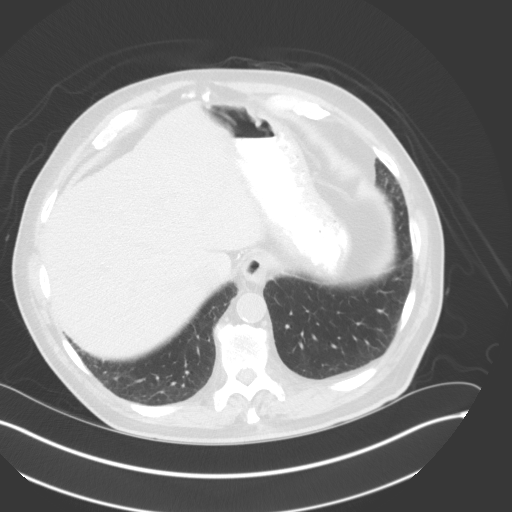
[im 92/101  lung]
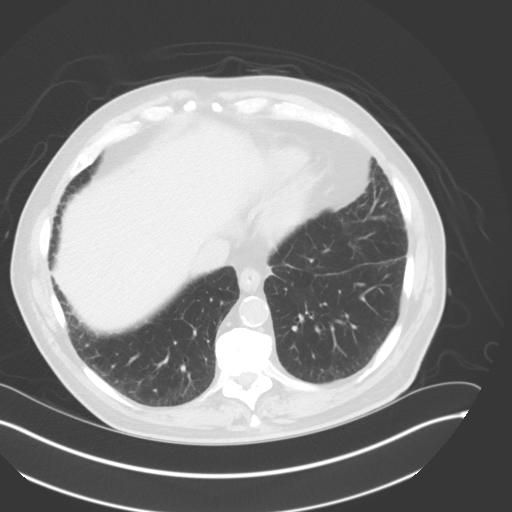
[im 96/101  soft-tissue]
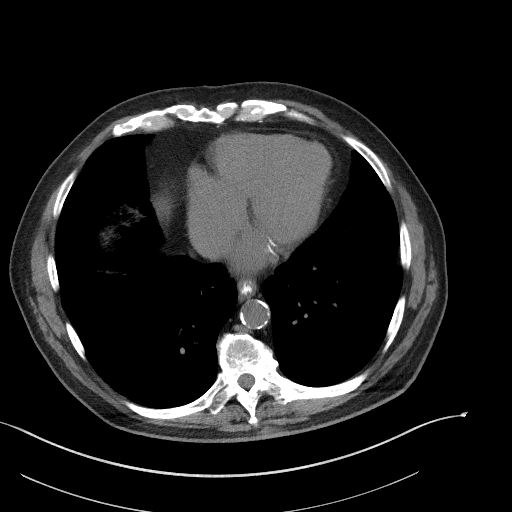
[im 96/101  lung]
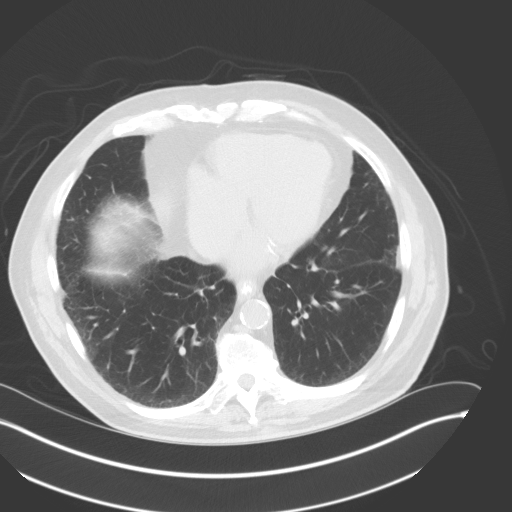

[14 of 32 positions shown; findings below may reference images not displayed]

FINDINGS: Lower chest: Minimal subpleural interstitial prominence at both lung
bases. Prior median sternotomy. Normal heart size without
pericardial or pleural effusion. Contrast within the lower
esophagus.

Hepatobiliary: Normal liver. Small gallstones without acute
cholecystitis or biliary duct dilatation.

Pancreas: Minimal calcifications within the pancreatic parenchyma.
No duct dilatation or mass.

Spleen: Normal in size, without focal abnormality.

Adrenals/Urinary Tract: Normal adrenal glands. Mild renal cortical
thinning bilaterally. No renal calculi or hydronephrosis. No
hydroureter or ureteric calculi. mild bladder wall thickening and
irregularity.

Stomach/Bowel: Normal stomach, without wall thickening. Extensive
colonic diverticulosis. Suspicion of mild interstitial thickening
about the junction of the descending and sigmoid colon. Example
image 72/series 2. Normal terminal ileum. Normal small bowel.

Vascular/Lymphatic: Advanced aortic and branch vessel
atherosclerosis. No abdominopelvic adenopathy.

Reproductive: Normal prostate.

Other: No significant free fluid. Fat containing left inguinal
hernia.

Musculoskeletal: Degenerative partial fusion of bilateral sacroiliac
joints.
IMPRESSION: 1. No evidence of left lower quadrant abdominal mass.
2. Extensive diverticulosis. Questionable interstitial thickening in
the left lower quadrant. Although acute diverticulitis could have
this appearance, the clinical history describes no current pain.
Therefore, this could be the sequelae of prior diverticulitis.
3. Bladder wall thickening and irregularity, suggesting bladder
outlet obstruction.
4. Cholelithiasis.
5. Advanced atherosclerosis.
6. Esophageal air fluid level suggests dysmotility or
gastroesophageal reflux.
7. Calcifications about the pancreatic parenchyma are suspicious for
chronic calcific pancreatitis. These calcifications could
alternatively be vascular.
8. Subtle interstitial prominence at the lung bases is suspicious
for interstitial lung disease, possibly nonspecific interstitial
pneumonitis. Correlate with Omayma Omayma symptoms of chronic shortness of
breath.

## 2018-02-11 ENCOUNTER — Encounter: Payer: Self-pay | Admitting: Intensive Care

## 2018-02-11 ENCOUNTER — Other Ambulatory Visit: Payer: Self-pay

## 2018-02-11 ENCOUNTER — Emergency Department
Admission: EM | Admit: 2018-02-11 | Discharge: 2018-02-11 | Disposition: A | Discharge status: Home / Self Care | Payer: PPO | Attending: Emergency Medicine | Admitting: Emergency Medicine

## 2018-02-11 ENCOUNTER — Emergency Department: Payer: PPO

## 2018-02-11 DIAGNOSIS — Z85828 Personal history of other malignant neoplasm of skin: Secondary | ICD-10-CM | POA: Diagnosis not present

## 2018-02-11 DIAGNOSIS — Z951 Presence of aortocoronary bypass graft: Secondary | ICD-10-CM | POA: Insufficient documentation

## 2018-02-11 DIAGNOSIS — I2510 Atherosclerotic heart disease of native coronary artery without angina pectoris: Secondary | ICD-10-CM | POA: Diagnosis not present

## 2018-02-11 DIAGNOSIS — M545 Low back pain: Secondary | ICD-10-CM | POA: Diagnosis not present

## 2018-02-11 DIAGNOSIS — I110 Hypertensive heart disease with heart failure: Secondary | ICD-10-CM | POA: Insufficient documentation

## 2018-02-11 DIAGNOSIS — E162 Hypoglycemia, unspecified: Secondary | ICD-10-CM

## 2018-02-11 DIAGNOSIS — Z794 Long term (current) use of insulin: Secondary | ICD-10-CM | POA: Diagnosis not present

## 2018-02-11 DIAGNOSIS — Z7982 Long term (current) use of aspirin: Secondary | ICD-10-CM | POA: Insufficient documentation

## 2018-02-11 DIAGNOSIS — Z87891 Personal history of nicotine dependence: Secondary | ICD-10-CM | POA: Insufficient documentation

## 2018-02-11 DIAGNOSIS — E11649 Type 2 diabetes mellitus with hypoglycemia without coma: Secondary | ICD-10-CM | POA: Insufficient documentation

## 2018-02-11 DIAGNOSIS — I509 Heart failure, unspecified: Secondary | ICD-10-CM | POA: Insufficient documentation

## 2018-02-11 DIAGNOSIS — Z79899 Other long term (current) drug therapy: Secondary | ICD-10-CM | POA: Diagnosis not present

## 2018-02-11 LAB — GLUCOSE, CAPILLARY
GLUCOSE-CAPILLARY: 216 mg/dL — AB (ref 70–99)
GLUCOSE-CAPILLARY: 231 mg/dL — AB (ref 70–99)
Glucose-Capillary: 124 mg/dL — ABNORMAL HIGH (ref 70–99)

## 2018-02-11 LAB — BASIC METABOLIC PANEL
Anion gap: 7 (ref 5–15)
BUN: 39 mg/dL — AB (ref 8–23)
CALCIUM: 8.6 mg/dL — AB (ref 8.9–10.3)
CO2: 21 mmol/L — AB (ref 22–32)
CREATININE: 2.24 mg/dL — AB (ref 0.61–1.24)
Chloride: 107 mmol/L (ref 98–111)
GFR calc non Af Amer: 26 mL/min — ABNORMAL LOW (ref >60)
GFR, EST AFRICAN AMERICAN: 30 mL/min — AB (ref >60)
Glucose, Bld: 128 mg/dL — ABNORMAL HIGH (ref 70–99)
Potassium: 4.4 mmol/L (ref 3.5–5.1)
SODIUM: 135 mmol/L (ref 135–145)

## 2018-02-11 LAB — CBC WITH DIFFERENTIAL/PLATELET
BASOS PCT: 1 %
Basophils Absolute: 0.1 10*3/uL (ref 0–0.1)
EOS ABS: 0.2 10*3/uL (ref 0–0.7)
Eosinophils Relative: 1 %
HCT: 32.2 % — ABNORMAL LOW (ref 40.0–52.0)
Hemoglobin: 11.1 g/dL — ABNORMAL LOW (ref 13.0–18.0)
Lymphocytes Relative: 6 %
Lymphs Abs: 0.6 10*3/uL — ABNORMAL LOW (ref 1.0–3.6)
MCH: 28.7 pg (ref 26.0–34.0)
MCHC: 34.5 g/dL (ref 32.0–36.0)
MCV: 83.3 fL (ref 80.0–100.0)
MONO ABS: 0.8 10*3/uL (ref 0.2–1.0)
MONOS PCT: 7 %
Neutro Abs: 9.3 10*3/uL — ABNORMAL HIGH (ref 1.4–6.5)
Neutrophils Relative %: 85 %
Platelets: 203 10*3/uL (ref 150–440)
RBC: 3.86 MIL/uL — ABNORMAL LOW (ref 4.40–5.90)
RDW: 15 % — AB (ref 11.5–14.5)
WBC: 10.9 10*3/uL — ABNORMAL HIGH (ref 3.8–10.6)

## 2018-02-11 NOTE — ED Notes (Signed)
Patient was brought in by EMS for hypoglycemia. Upon arrival patient was A&O x4. Patient states "I was not acting right and talking real loud and not making sense to my wife. I told her I didn't know why I was acting like this and could not stop" Patient takes 62 units insulin before bedtime at night. Patient reported he hasnt been eating as much because he is trying to lose weight.

## 2018-02-11 NOTE — ED Triage Notes (Addendum)
Patient arrived by EMS from home for hypoglycemia. EMS reported when arrived to home got blood sugar of 54. Had patient eat a bowel of cereal and second check of blood sugar was 77. Patients wife called EMS because she reported he was not acting himself and yelling out. Patient states "I remember not acting myself and telling my wife I dont know why I am acting this way but I cannot stop" Blood sugar upon arrival to ER is 124. Patient A&O x4.

## 2018-02-11 NOTE — ED Notes (Signed)
Patient eating graham crackers with peanut butter and drinking orange juice

## 2018-02-11 NOTE — ED Notes (Signed)
AAOx3.  Skin warm and dry no apparent distress.  Family expressing concerns about patient taking medications appropriately at home.  Discussed strategies to taking meds at home including using daily pill organizers and medication schedule.  Encouraged follow up with Primary Care.

## 2018-02-11 NOTE — Discharge Instructions (Addendum)
Cut your insulin dose in half and start taking it in the morning, tomorrow morning. Hold this morning glipizide and resume it this evening. Keep a log of your blood glucose at home.  Return to the emergency room if your blood glucose is less than 80 or greater than 300.  Otherwise make sure to follow-up with your doctor on Monday.

## 2018-02-11 NOTE — ED Provider Notes (Signed)
[facility] Emergency Department Provider Note  ____________________________________________  Time seen: Approximately 11:12 AM  I have reviewed the triage vital signs and the nursing notes.   HISTORY  Chief Complaint Hypoglycemia   HPI [name] is a 77 y.o. male with history of CAD status post CABG, diabetes on Lantus and glipizide, hypertension, CHF, chronic kidney disease, indwelling suprapubic catheter who presents for evaluation of hypoglycemia.  According to the wife patient was very confused this morning which prompted EMS to be called.  When EMS arrived patient's blood glucose was 54.  Patient has just eaten a bowl of cereal and a repeat blood glucose was 77.  Patient now feels back to his baseline.  Patient reports that he takes 62 units of Lantus at bedtime and glipizide during the day.  His last Lantus was last night.  He reports that he has been eating a lot less since he is trying to lose weight.  Is also complaining of low back pain which she has had for 5 days after a fall.  He reports that he lost his balance while walking in the house and fell hitting his lower back onto the wall.  He is complaining of sharp pain located in the left lower back that is usually worse in the morning when he wakes up and gets better as he walks and moves around.  The pain is mild at this time.  Is been taking Tylenol at home for this pain.  He denies head trauma or LOC.  Is not on blood thinners.  He denies saddle anesthesia, urinary or bowel incontinence or retention, bilateral lower extremity weakness or numbness. Past Medical History:  Diagnosis Date  . Carcinoma of skin   . Cauda equina compression ([place])   . Coronary artery disease 2005   CABG X 7 in 2005  . Diabetes mellitus without complication ([place])   . Diabetic retinopathy ([place])   . GERD (gastroesophageal reflux disease)   . Hypertension     Patient Active Problem List   Diagnosis Date Noted  .  Gallbladder disease 09/05/2015  . Colon wall thickening 09/05/2015  . Diverticulosis 09/05/2015  . Disease of intestine 09/05/2015  . Diverticulosis of intestine without perforation or abscess without bleeding 09/05/2015  . Abdominal mass 08/11/2015  . CHF (congestive heart failure) ([place]) 08/11/2015  . Intra-abdominal and pelvic swelling, mass and lump, unspecified site 08/11/2015  . Urinary retention   . Phimosis   . Balanoposthitis   . Acute kidney injury ([place]) 07/29/2015  . Acute kidney failure ([place]) 07/29/2015  . Diabetes ([place]) 02/11/2015  . Type 2 diabetes mellitus without complications ([place]) 73/71/0626  . Cauda equina compression ([place]) 08/23/2013  . Bradycardia 04/06/2013  . Coronary artery disease   . Hypertension     Past Surgical History:  Procedure Laterality Date  . BACK SURGERY  07/2012  . CARDIAC CATHETERIZATION  2005  . CATARACT EXTRACTION  2014   right eye   . CORONARY ARTERY BYPASS GRAFT  2005    CABG x 7 at [place]     left knee    Prior to Admission medications   Medication Sig Start Date End Date Taking? Authorizing Provider  acetaminophen (TYLENOL) 500 MG tablet Take 1,000 mg by mouth every 6 (six) hours as needed for mild pain, fever or headache.   Yes [provider]  amitriptyline (ELAVIL) 10 MG tablet Take 2 tablets (20 mg total) by mouth at  bedtime. 09/22/15  Yes [name], MD  amLODipine (NORVASC) 10 MG tablet Take 1 tablet (10 mg total) by mouth daily. 05/25/16  Yes [name], MD  aspirin EC 81 MG tablet Take 81 mg by mouth daily.   Yes [provider]  glipiZIDE (GLUCOTROL) 5 MG tablet take 2 tablets by mouth twice a day before meals Patient taking differently: Take 10 mg by mouth 2 (two) times daily before a meal.  05/03/16  Yes [name], MD  Insulin Glargine (LANTUS) 100 UNIT/ML Solostar Pen Inject 62 Units into the skin daily at 10 pm.    Yes [provider]    losartan (COZAAR) 25 MG tablet Take 1 tablet (25 mg total) by mouth daily. 05/10/16  Yes [name], MD  omeprazole (PRILOSEC) 20 MG capsule Take 1 capsule (20 mg total) by mouth daily. 04/12/16  Yes [name], MD  simvastatin (ZOCOR) 10 MG tablet take 1 tablet by mouth once daily AT 6PM Patient taking differently: Take 10 mg by mouth at bedtime.  06/28/16  Yes [name], MD  tamsulosin (FLOMAX) 0.4 MG CAPS capsule Take 1 capsule (0.4 mg total) by mouth daily. 08/01/15  Yes [name], MD    Allergies Fluorescein; Ivp dye [iodinated diagnostic agents]; Morphine; and Codeine  Family History  Problem Relation Age of Onset  . Hypertension Sister   . Hyperlipidemia Sister     Social History Social History   Tobacco Use  . Smoking status: Former Smoker    Years: 45.00    Types: Pipe    Last attempt to quit: 2016    Years since quitting: 3.6  . Smokeless tobacco: Never Used  Substance Use Topics  . Alcohol use: Yes    Alcohol/week: 0.0 standard drinks    Comment: occasional.  . Drug use: No    Review of Systems  Constitutional: Negative for fever. + confusion Eyes: Negative for visual changes. ENT: Negative for sore throat. Neck: No neck pain  Cardiovascular: Negative for chest pain. Respiratory: Negative for shortness of breath. Gastrointestinal: Negative for abdominal pain, vomiting or diarrhea. Genitourinary: Negative for dysuria. Musculoskeletal: + back pain. Skin: Negative for rash. Neurological: Negative for headaches, weakness or numbness. Psych: No SI or HI  ____________________________________________   PHYSICAL EXAM:  VITAL SIGNS: ED Triage Vitals  Enc Vitals Group     BP 02/11/18 1017 (!) 164/71     Pulse Rate 02/11/18 1017 72     Resp 02/11/18 1017 19     Temp 02/11/18 1017 97.8 F (36.6 C)     Temp Source 02/11/18 1017 Oral     SpO2 02/11/18 1017 99 %     Weight 02/11/18 1018 212 lb (96.2 kg)     Height  02/11/18 1018 6\' 2"  (1.88 m)     Head Circumference --      Peak Flow --      Pain Score 02/11/18 1018 0     Pain Loc --      Pain Edu? --      Excl. in [place]? --     Constitutional: Alert and oriented x 3. Well appearing and in no apparent distress. HEENT:      Head: Normocephalic and atraumatic.         Eyes: Conjunctivae are normal. Sclera is non-icteric.       Mouth/Throat: Mucous membranes are moist.       Neck: Supple  with no signs of meningismus. Cardiovascular: Regular rate and rhythm. No murmurs, gallops, or rubs. 2+ symmetrical distal pulses are present in all extremities. No JVD. Respiratory: Normal respiratory effort. Lungs are clear to auscultation bilaterally. No wheezes, crackles, or rhonchi.  Gastrointestinal: Soft, non tender, and non distended with positive bowel sounds. No rebound or guarding. Musculoskeletal: L paraspinal tenderness to palpation, no bruises, no midline ttp. Nontender with normal range of motion in all extremities. No edema, cyanosis, or erythema of extremities. Neurologic: Normal speech and language. Face is symmetric. Moving all extremities. No gross focal neurologic deficits are appreciated. DTRs 2+ b/l LE  Skin: Skin is warm, dry and intact. No rash noted. Psychiatric: Mood and affect are normal. Speech and behavior are normal.  ____________________________________________   LABS (all labs ordered are listed, but only abnormal results are displayed)  Labs Reviewed  GLUCOSE, CAPILLARY - Abnormal; Notable for the following components:      Result Value   Glucose-Capillary 124 (*)    All other components within normal limits  CBC WITH DIFFERENTIAL/PLATELET - Abnormal; Notable for the following components:   WBC 10.9 (*)    RBC 3.86 (*)    Hemoglobin 11.1 (*)    HCT 32.2 (*)    RDW 15.0 (*)    Neutro Abs 9.3 (*)    Lymphs Abs 0.6 (*)    All other components within normal limits  BASIC METABOLIC PANEL - Abnormal; Notable for the following  components:   CO2 21 (*)    Glucose, Bld 128 (*)    BUN 39 (*)    Creatinine, Ser 2.24 (*)    Calcium 8.6 (*)    GFR calc non Af Amer 26 (*)    GFR calc Af Amer 30 (*)    All other components within normal limits  GLUCOSE, CAPILLARY - Abnormal; Notable for the following components:   Glucose-Capillary 216 (*)    All other components within normal limits  GLUCOSE, CAPILLARY - Abnormal; Notable for the following components:   Glucose-Capillary 231 (*)    All other components within normal limits  URINALYSIS, COMPLETE (UACMP) WITH MICROSCOPIC  CBG MONITORING, ED  CBG MONITORING, ED   ____________________________________________  EKG  none ____________________________________________  RADIOLOGY  I have personally reviewed the images performed during this visit and I agree with the Radiologist's read.   Interpretation by Radiologist:  Dg Lumbar Spine Complete  Result Date: 02/11/2018 CLINICAL DATA:  Acute low back pain following fall 4 days ago. Initial encounter. EXAM: LUMBAR SPINE - COMPLETE 4+ VIEW COMPARISON:  09/01/2015 abdomen/pelvic CT FINDINGS: Five non rib-bearing lumbar type vertebra are identified in normal alignment. No acute fracture subluxation. Mild to moderate multilevel degenerative disc disease, spondylosis and facet arthropathy identified, greatest at L4-5. No focal bony lesions or spondylolysis. Aortic atherosclerotic calcifications are present. IMPRESSION: 1. No acute abnormality 2. Multilevel degenerative changes as described. 3.  Aortic Atherosclerosis (ICD10-I70.0). Electronically Signed   By: Margarette Canada M.D.   On: 02/11/2018 11:00     ____________________________________________   PROCEDURES  Procedure(s) performed: None Procedures Critical Care performed:  None ____________________________________________   INITIAL IMPRESSION / ASSESSMENT AND PLAN / ED COURSE   77 y.o. male with history of CAD status post CABG, diabetes on Lantus and glipizide,  hypertension, CHF, chronic kidney disease, indwelling suprapubic catheter who presents for evaluation of hypoglycemia.   # hypoglycemia: Most likely due to decreased p.o. intake as patient is trying to lose weight and taking the same amount of  Lantus.  We will do serial CBGs for the next 2 hours.  Will give orange juice and peanut butter here.  Will check labs and UA to rule out alternative etiology for hypoglycemia.  # back pain: Patient with left paraspinal tenderness status post mechanical fall 5 days ago.  No signs or symptoms of cauda equina on exam.  No midline tenderness.  X-ray showing no injuries.    11:29 AM on 02/11/2018 -----------------------------------------  Creatinine is elevated at 2.24. Since we do not have labs from the patient since 2017 I called the [place] [facility] where patient goes for his care and spoke with one of the ED doctors who told me patient's baseline creatinine was 2.4 and last checked on July 2019. Other labs WNL.    Clinical Course as of Feb 12 1540  Sat Feb 11, 2018  1228 Patient monitored for 2.5 hours with no further episodes of hypoglycemia.  Repeat glucose is now 216.  Recommended decreasing the dose of Lantus from 62 to 31 units at nighttime and keeping a close log of the blood glucose for the next few days.  Recommend close follow-up with primary care doctor on Monday.  Discussed return precautions for extremely high or low sugars.   [CV]    Clinical Course User Index [CV] Alfred Levins [place], MD   _________________________    As part of my medical decision making, I reviewed the following data within the [place] notes reviewed and incorporated, Labs reviewed , Old chart reviewed, Notes from prior ED visits and [place] Controlled Substance Database    Pertinent labs & imaging results that were available during my care of the patient were reviewed by me and considered in my medical decision making (see chart for  details).    ____________________________________________   FINAL CLINICAL IMPRESSION(S) / ED DIAGNOSES  Final diagnoses:  Hypoglycemia      NEW MEDICATIONS STARTED DURING THIS VISIT:  ED Discharge Orders    None       Note:  This document was prepared using Dragon voice recognition software and may include unintentional dictation errors.    Alfred Levins, [place], MD 02/11/18 [phone]

## 2018-02-13 ENCOUNTER — Ambulatory Visit (INDEPENDENT_AMBULATORY_CARE_PROVIDER_SITE_OTHER): Payer: PPO | Admitting: Family Medicine

## 2018-02-13 ENCOUNTER — Encounter: Payer: Self-pay | Admitting: Family Medicine

## 2018-02-13 VITALS — BP 166/59 | HR 79 | Temp 98.3°F | Resp 16 | Ht 74.0 in | Wt 214.6 lb

## 2018-02-13 DIAGNOSIS — N183 Chronic kidney disease, stage 3 unspecified: Secondary | ICD-10-CM | POA: Insufficient documentation

## 2018-02-13 DIAGNOSIS — N179 Acute kidney failure, unspecified: Secondary | ICD-10-CM | POA: Diagnosis not present

## 2018-02-13 DIAGNOSIS — R296 Repeated falls: Secondary | ICD-10-CM

## 2018-02-13 DIAGNOSIS — J3089 Other allergic rhinitis: Secondary | ICD-10-CM

## 2018-02-13 DIAGNOSIS — E11649 Type 2 diabetes mellitus with hypoglycemia without coma: Secondary | ICD-10-CM | POA: Diagnosis not present

## 2018-02-13 DIAGNOSIS — Z794 Long term (current) use of insulin: Secondary | ICD-10-CM | POA: Diagnosis not present

## 2018-02-13 MED ORDER — FLUTICASONE PROPIONATE 50 MCG/ACT NA SUSP: 2.0000 | Freq: Every day | NASAL | 3 refills | Status: AC

## 2018-02-13 MED ORDER — LORATADINE 10 MG PO TABS: 10.0000 mg | ORAL_TABLET | Freq: Every day | ORAL | 11 refills | Status: AC

## 2018-02-13 NOTE — Patient Instructions (Addendum)
Thank you for coming to the office today.  For cough  Start nasal steroid Flonase 2 sprays in each nostril daily for 4-6 weeks, may repeat course seasonally or as needed  Start Claritin (generic, loratadine) 10mg  daily  If not improving by 2-3 days call back or if fever chills productive cough - we can consider an antibiotic if needed and possibly chest x-ray.  DISCONTINUE Glipizide 5mg  (previously taking twice a day) - this can cause hypoglycemia and low sugar episodes, this is not recommended to take anymore, especially if on insulin. - You need to make sure pharmacy does not refill this in future  START Lantus insulin 60 units for now - you will need to adjust in future - IF FASTING morning blood sugar (before food or drink 1st thing) is LESS THAN 170 - on average - then REDUCE dose by 2 units after 1 week. So - by next week can do 58 units, and then keep gradually going down to adjust this in future.  Recommend to start taking Tylenol Extra Strength 500mg  tabs - take 1 to 2 tabs per dose (max 1000mg ) every 6-8 hours for pain (take regularly, don't skip a dose for next 7 days), max 24 hour daily dose is 6 tablets or 3000mg . In the future you can repeat the same everyday Tylenol course for 1-2 weeks at a time.   Because of your Kidney Function - DO NOT TAKE ANY anti-inflammatory medicinesIbuprofen, Advil, Naproxen, Aleve, Meloxicam, Mobic.   Use RICE therapy: - R - Rest / relative rest with activity modification avoid overuse of joint - I - Ice packs (make sure you use a towel or sock / something to protect skin) - C - Compression with ACE wrap to apply pressure and reduce swelling allowing more support - E - Elevation - if significant swelling, lift leg above heart level (toes above your nose) to help reduce swelling, most helpful at night after day of being on your feet  [facility] Specialist) need to see them to discuss reduced kidney function and  swelling, - otherwise we can refer you to a local Nephrologist.  For Home Health referral - we will send it to [place] - stay tuned for this to start within 1 week approximately.  Please schedule a Follow-up Appointment to: Return in about 3 months (around 05/16/2018) for DM A1c, CKD, Edema, Dyspnea > Cardiology.  If you have any other questions or concerns, please feel free to call the office or send a message through [place]. You may also schedule an earlier appointment if necessary.  Additionally, you may be receiving a survey about your experience at our office within a few days to 1 week by e-mail or mail. We value your feedback.  Nobie Putnam, DO [place]

## 2018-02-13 NOTE — Progress Notes (Signed)
Subjective:    Patient ID: [name], male    DOB: 29-Jul-1940, 77 y.o.   MRN: [id number]  [name] is a 77 y.o. male presenting on 02/13/2018 for Hospitalization Follow-up (hypoglycemia)  First visit with me as his new PCP today to establish care and for hospital follow-up, previously followed with prior PCP Dr Luan Pulling who has since retired in 2018. He was also followed by [facility] but has decided to come here for some of his care now due to convenience.  Patient provides most of history today. Also - stepdaughter, Ginger, here provides additional history. She is here to help today but is not a regular primary caregiver. He has often relied on his wife for help at home with his health but she has been in poor health as well.  HPI   ED FOLLOW-UP VISIT  Hospital/Location: [place] Date of ED Visit: 02/11/18  Reason for Presenting to ED: Hypoglycemia, Type 2 diabetes  FOLLOW-UP - ED provider note and record have been reviewed - Patient presents today about 2 days after recent ED visit. Brief summary of recent course, patient had symptoms of feeling very shakiness and weak when he woke up Saturday AM 8/24, he then suddenly had changes of mental confusion and he was reported to have "gone berserk" and was "saying things" out of his head, now this was 2nd episode in past 1 month, with hypoglycemia 53, they called EMS / Photographer, and they gave him peanut and OJ, and it improved he was taken to hospital, presented to ED, testing in ED with labs CBC and chemistry showed elevated Creatinine significantly, but he has history of CKD. Also had X-ray of Lumbar spine with back pain and complaint of recurrent falls. Treated with REDUCED insulin by half and asked to follow-up with PCP.  - Today reports overall has done well after discharge from ED. Symptoms of hypoglycemia have resolved, he is trying to maintain appetite and has not restarted all meds yet, he has had CBGs elevated various  ranges 100 up too 300+  - New medications on discharge: None - Changes to current meds on discharge: Reduced Lantus from 62 down to 31  Previously his DM care was done at [facility], unsure when next apt perhaps in several months, do not have their records he is overdue for DM eye and DM foot exam. - Complication of his DM with CKD-III vs IV, he was unaware of this problem was never told by his report he had kidney insufficiency or damage  Admits hypoglycemia.  Chronic Back Pain, Acute on chronic Known complex back history OA/DJD lumbar spine with s/p surgery from [facility] Dr Ellender Hose with disc treatment removed x 2  Chronic Urinary Retention / BPH Currently has suprapubic catheter in place, with leg bag for catheter, he returns this Friday for further management. On alpha blocker as well.  Additonal complaint - Recurrent falls at home - asking about possibility of home health  - Cough - reports some allergy symptoms and dry cough recently - Edema (lower extremity) - uncertain how long present seems worse in past 1 month, both legs, has tried partial elevation not regularly, he is asking about fluid pill today.  Additional PMH includes systolic CHF but he does not have cardiologist  Depression screen [facility] 2/9 02/13/2018 05/25/2016 11/10/2015  Decreased Interest 0 0 1  Down, Depressed, Hopeless 0 0 1  PHQ - 2 Score 0 0 2  Altered sleeping - - 0  Tired, decreased energy - - 3  Change in appetite - - 0  Feeling bad or failure about yourself  - - 1  Trouble concentrating - - 0  Moving slowly or fidgety/restless - - 1  Suicidal thoughts - - 0  PHQ-9 Score - - 7  Difficult doing work/chores - - Not difficult at all    Social History   Tobacco Use  . Smoking status: Former Smoker    Years: 45.00    Types: Pipe    Last attempt to quit: 2016    Years since quitting: 3.6  . Smokeless tobacco: Former Network engineer Use Topics  . Alcohol use: Yes    Alcohol/week: 0.0 standard drinks     Comment: occasional.  . Drug use: No    Review of Systems Per HPI unless specifically indicated above     Objective:    BP (!) 166/59   Pulse 79   Temp 98.3 F (36.8 C) (Oral)   Resp 16   Ht 6\' 2"  (1.88 m)   Wt 214 lb 9.6 oz (97.3 kg)   SpO2 100%   BMI 27.55 kg/m   Wt Readings from Last 3 Encounters:  02/13/18 214 lb 9.6 oz (97.3 kg)  02/11/18 212 lb (96.2 kg)  09/02/16 208 lb (94.3 kg)    Physical Exam  Constitutional: He is oriented to person, place, and time. He appears well-developed and well-nourished. No distress.  Well-appearing elderly male, comfortable, cooperative  HENT:  Head: Normocephalic and atraumatic.  Mouth/Throat: Oropharynx is clear and moist.  Eyes: Conjunctivae are normal. Right eye exhibits no discharge. Left eye exhibits no discharge.  Neck: Normal range of motion. Neck supple. No thyromegaly present.  Cardiovascular: Normal rate, regular rhythm, normal heart sounds and intact distal pulses.  No murmur heard. Pulmonary/Chest: Effort normal and breath sounds normal. No respiratory distress. He has no wheezes. He has no rales.  Occasional coughing, non productive.  Good air movement.  Musculoskeletal: Normal range of motion. He exhibits no edema.  Generalized low back muscle hypertonicity without focal point tenderness.  Able to slowly stand from seated, required 2-3 attempts without assistance. Not using cane or assistance device.  Gait cautious but mostly normal  Lymphadenopathy:    He has no cervical adenopathy.  Neurological: He is alert and oriented to person, place, and time.  Skin: Skin is warm and dry. No rash noted. He is not diaphoretic. No erythema.  Psychiatric: He has a normal mood and affect. His behavior is normal.  Well groomed, good eye contact, normal speech and thoughts  Nursing note and vitals reviewed.    I have personally reviewed the radiology report from 02/11/18 Lumbar Spine X-ray.  CLINICAL DATA: Acute low back pain  following fall 4 days ago. Initial encounter.  EXAM: LUMBAR SPINE - COMPLETE 4+ VIEW  COMPARISON: 09/01/2015 abdomen/pelvic CT  FINDINGS: Five non rib-bearing lumbar type vertebra are identified in normal alignment.  No acute fracture subluxation.  Mild to moderate multilevel degenerative disc disease, spondylosis and facet arthropathy identified, greatest at L4-5.  No focal bony lesions or spondylolysis.  Aortic atherosclerotic calcifications are present.  IMPRESSION: 1. No acute abnormality 2. Multilevel degenerative changes as described. 3. Aortic Atherosclerosis (ICD10-I70.0).   Electronically Signed By: Margarette Canada M.D. On: 02/11/2018 11:00    Results for orders placed or performed during the hospital encounter of 02/11/18  Glucose, capillary  Result Value Ref Range   Glucose-Capillary 124 (H) 70 - 99 mg/dL  CBC with  Differential/Platelet  Result Value Ref Range   WBC 10.9 (H) 3.8 - 10.6 K/uL   RBC 3.86 (L) 4.40 - 5.90 MIL/uL   Hemoglobin 11.1 (L) 13.0 - 18.0 g/dL   HCT 32.2 (L) 40.0 - 52.0 %   MCV 83.3 80.0 - 100.0 fL   MCH 28.7 26.0 - 34.0 pg   MCHC 34.5 32.0 - 36.0 g/dL   RDW 15.0 (H) 11.5 - 14.5 %   Platelets 203 150 - 440 K/uL   Neutrophils Relative % 85 %   Neutro Abs 9.3 (H) 1.4 - 6.5 K/uL   Lymphocytes Relative 6 %   Lymphs Abs 0.6 (L) 1.0 - 3.6 K/uL   Monocytes Relative 7 %   Monocytes Absolute 0.8 0.2 - 1.0 K/uL   Eosinophils Relative 1 %   Eosinophils Absolute 0.2 0 - 0.7 K/uL   Basophils Relative 1 %   Basophils Absolute 0.1 0 - 0.1 K/uL  Basic metabolic panel  Result Value Ref Range   Sodium 135 135 - 145 mmol/L   Potassium 4.4 3.5 - 5.1 mmol/L   Chloride 107 98 - 111 mmol/L   CO2 21 (L) 22 - 32 mmol/L   Glucose, Bld 128 (H) 70 - 99 mg/dL   BUN 39 (H) 8 - 23 mg/dL   Creatinine, Ser 2.24 (H) 0.61 - 1.24 mg/dL   Calcium 8.6 (L) 8.9 - 10.3 mg/dL   GFR calc non Af Amer 26 (L) >60 mL/min   GFR calc Af Amer 30 (L) >60 mL/min   Anion gap  7 5 - 15  Glucose, capillary  Result Value Ref Range   Glucose-Capillary 216 (H) 70 - 99 mg/dL  Glucose, capillary  Result Value Ref Range   Glucose-Capillary 231 (H) 70 - 99 mg/dL   Recent Labs    02/14/18 0158  HGBA1C 6.9*       Assessment & Plan:   Problem List Items Addressed This Visit    Acute kidney failure (HCC)   CKD (chronic kidney disease), stage III ([place]) Discussion today on this chronic issue, labs date back to >2014 with similar problem, recently with AoCKD suspected poor hydration and acute hosiptal illness with hypoglycemia. He was unfamiliar with dx CKD, was unaware could not take NSAID  Plan Discontinue Aleve/Ibuprofen NSAID today Improve hydration Monitor Cr trend - will discuss repeat labs within next few weeks to month upon follow-up May need to come off ARB in near future - he was asked to schedule with Nephrologist, he was not followed by them before, [facility] and if needed we can refer local to [facility]    Recurrent falls Concern with multifactorial etiology for falls, not sure exactly trigger seems to be home safety and may need proper device for ambulation, also at risk with hypoglycemia and lack of home support - Referral to [facility] for gait training and strengthening may need device    Type 2 diabetes mellitus with hypoglycemia ([place]) - Primary Concern with hypoglycemia episodes more recently Review of meds suggests he is on inappropriate medications with Sulfonylure and insulin combination Today A1c 6.9 is controlled at age 39, he does not need as aggressive management Complicated by [place] patient this is first time I am involved in his care, he has had fragmented care and limited follow-up  Plan DISCONTINUE sulfonylurea glipizide completely Agree to resume prior insulin basal dose he has tolerated well for long time - REDUCE down to 60 instead of 62, and  specific titration down instructions given gradual reduce goal  30-40s Hypoglycemia precautions given, return criteria reviewed    Relevant Orders   POCT HgB A1C    Other Visit Diagnoses    Seasonal allergic rhinitis due to other allergic trigger     Likely etiology of persistent cough x 2-3 days, without fever chills URI symptoms or productive cough No prior astham or COPD Treat with sinus medicine allergies Loratadine / Flonase Follow-up if not improving, consider CXR and antibiotics empirically if need    Relevant Medications   fluticasone (FLONASE) 50 MCG/ACT nasal spray   loratadine (CLARITIN) 10 MG tablet      #Edema, B/L LE Not fully addressed today, recommend RICE therapy per AVS Suspect may be cardiac or kidney related, he has history of systolic CHF on chart Asked him to check with nephrology and future may need return Cardiology Declined to start diuretic today by his request, would risk hypovolemia further and may impact CKD  Orders Placed This Encounter  Procedures  . POCT HgB A1C    Meds ordered this encounter  Medications  . fluticasone (FLONASE) 50 MCG/ACT nasal spray    Sig: Place 2 sprays into both nostrils daily. Use for 4-6 weeks then stop and use seasonally or as needed.    Dispense:  16 g    Refill:  3  . loratadine (CLARITIN) 10 MG tablet    Sig: Take 1 tablet (10 mg total) by mouth daily. Use for 4-6 weeks then stop, and use as needed or seasonally    Dispense:  30 tablet    Refill:  11    Follow up plan: Return in about 3 months (around 05/16/2018) for DM A1c, CKD, Edema, Dyspnea > Cardiology.  Nobie Putnam, [facility] 02/13/2018, 4:13 PM

## 2018-02-14 ENCOUNTER — Encounter: Payer: Self-pay | Admitting: Family Medicine

## 2018-02-14 DIAGNOSIS — R296 Repeated falls: Secondary | ICD-10-CM | POA: Insufficient documentation

## 2018-02-14 LAB — POCT GLYCOSYLATED HEMOGLOBIN (HGB A1C): Hemoglobin A1C: 6.9 % — AB (ref 4.0–5.6)

## 2018-02-15 DIAGNOSIS — L851 Acquired keratosis [keratoderma] palmaris et plantaris: Secondary | ICD-10-CM | POA: Diagnosis not present

## 2018-02-15 DIAGNOSIS — E1142 Type 2 diabetes mellitus with diabetic polyneuropathy: Secondary | ICD-10-CM | POA: Diagnosis not present

## 2018-02-15 DIAGNOSIS — B351 Tinea unguium: Secondary | ICD-10-CM | POA: Diagnosis not present

## 2018-02-18 DIAGNOSIS — M47896 Other spondylosis, lumbar region: Secondary | ICD-10-CM | POA: Diagnosis not present

## 2018-02-18 DIAGNOSIS — E11649 Type 2 diabetes mellitus with hypoglycemia without coma: Secondary | ICD-10-CM | POA: Diagnosis not present

## 2018-02-18 DIAGNOSIS — Z960 Presence of urogenital implants: Secondary | ICD-10-CM | POA: Diagnosis not present

## 2018-02-18 DIAGNOSIS — Z87891 Personal history of nicotine dependence: Secondary | ICD-10-CM | POA: Diagnosis not present

## 2018-02-18 DIAGNOSIS — R339 Retention of urine, unspecified: Secondary | ICD-10-CM | POA: Diagnosis not present

## 2018-02-18 DIAGNOSIS — E1122 Type 2 diabetes mellitus with diabetic chronic kidney disease: Secondary | ICD-10-CM | POA: Diagnosis not present

## 2018-02-18 DIAGNOSIS — N183 Chronic kidney disease, stage 3 (moderate): Secondary | ICD-10-CM | POA: Diagnosis not present

## 2018-02-18 DIAGNOSIS — Z794 Long term (current) use of insulin: Secondary | ICD-10-CM | POA: Diagnosis not present

## 2018-02-18 DIAGNOSIS — N401 Enlarged prostate with lower urinary tract symptoms: Secondary | ICD-10-CM | POA: Diagnosis not present

## 2018-02-18 DIAGNOSIS — R296 Repeated falls: Secondary | ICD-10-CM | POA: Diagnosis not present

## 2018-02-21 ENCOUNTER — Telehealth: Payer: Self-pay

## 2018-02-21 DIAGNOSIS — M545 Low back pain, unspecified: Secondary | ICD-10-CM

## 2018-02-21 MED ORDER — PREDNISONE 10 MG PO TABS: ORAL_TABLET | ORAL | 0 refills | Status: DC

## 2018-02-21 NOTE — Telephone Encounter (Signed)
Patient called reporting that his back is still hurting with no improvement from his last appointment.  He is requesting something better then tylenol for the pain.  Please advise

## 2018-02-21 NOTE — Telephone Encounter (Signed)
Called patient. Covering inbox today for Dr. Parks Ranger.  Patient is having intermittent sharp pain, but more pain in last 2 days.  Getting into and out of bed, into and out of a chair.  Denies radiculopathy. - Heating patch on back  - Home Health coming tomorrow - PT and possibly social work.helps.    - Considered meloxicam, deferred 2/2 renal function.  Also defer tramadol for same reason. - Can try prednisone taper for acute worsening of chronic pain over last 2 days. - Start prednisone taper over 7 days Day 1-2: 60 mg, Day 3: 50 mg, Day 4: 40 mg; Day 5: 30 mg; Day 6 20 mg; Day 7: 10 mg then stop.

## 2018-03-01 ENCOUNTER — Telehealth: Payer: Self-pay | Admitting: Family Medicine

## 2018-03-01 NOTE — Telephone Encounter (Signed)
Last visit with me 8/26 patient was ordered to have [facility] therapy. Raquel Sarna PT [phone]) called [place] to report he has had some issues with balance and they are working on this. He has history of hypoglycemia.  Verbal orders were given for [facility] PT.  He was also having issue with blood in his suprapubic catheter bag and some irritation of suprapubic cath site. He was seen at hospital (unsure which location - I do not have record of this available in chart) and given Cipro for short course. Seems that they are reporting patient is getting worse, has some blood in urine still. Some UTI symptoms. The hospital took a culture (I do not have result).  He is normally followed by [facility] through [place] [phone]) ext [phone]. Frederich Cha CMA called them earlier today to see if they can see patient to schedule him for office visit since he did not want to return to hospital for evaluation. They will arrange outpatient follow-up and treatment given his suprapubic catheter problems. As I advised I typically do not manage that particular issue, and given his concerns agreed with more urgent evaluation.  Nobie Putnam, [facility] 03/01/2018, 5:12 PM

## 2018-03-02 DIAGNOSIS — R339 Retention of urine, unspecified: Secondary | ICD-10-CM | POA: Diagnosis not present

## 2018-03-03 ENCOUNTER — Telehealth: Payer: Self-pay | Admitting: Family Medicine

## 2018-03-03 NOTE — Telephone Encounter (Signed)
Left detail message. 

## 2018-03-03 NOTE — Telephone Encounter (Signed)
Incoming

## 2018-03-03 NOTE — Telephone Encounter (Signed)
Please call to give [facility] skilled nursing orders as requested twice a week for 2 weeks then once weekly for 5 weeks.  I will authorize Bactroban to use as needed.  I would not recommend Pyridium. I would ask them to contact Urologist to determine if Pyridium is safe to use or not, it can harm kidneys.  Nobie Putnam, [facility] 03/03/2018, 12:58 PM

## 2018-03-03 NOTE — Telephone Encounter (Signed)
[name] with [facility] needs verbal for skilled nursing visits twice a weed for 2 weeks, then once a week for 5 weeks.  She also wants to irrigate his catheter and asked for pt to have pyridium.  He also has a painful red spot and asked for bactroban cream.  Her call back number is [phone]

## 2018-03-06 ENCOUNTER — Encounter: Payer: Self-pay | Admitting: Nurse Practitioner

## 2018-03-06 NOTE — Progress Notes (Signed)
Patient has results faxed to office from [place] 03/02/2018   Results Reported 03/04/2018 SG 1.016 pH 6.0 Urine character: Yellow, Cloudy WBC +2 Protein +3  Glucose +2 Ketones neg Blood +2 Bilirubin neg Urobilinogen 0.2 Nitrite neg  Micro: WBC > 30 RBC 11-30 No epithelial cells Mucus threads present Bacteria few Yeast present  Culture: Mixed flora 50,000-100,000 colony forming units per mL, which is likely contamination.   Results are concerning for candidal infection of bladder.  Will defer treatment to urology.  Will request to have report faxed to urologist on 03/07/2018.

## 2018-03-07 DIAGNOSIS — Z794 Long term (current) use of insulin: Secondary | ICD-10-CM | POA: Diagnosis not present

## 2018-03-07 DIAGNOSIS — E1122 Type 2 diabetes mellitus with diabetic chronic kidney disease: Secondary | ICD-10-CM | POA: Diagnosis not present

## 2018-03-07 DIAGNOSIS — E11649 Type 2 diabetes mellitus with hypoglycemia without coma: Secondary | ICD-10-CM | POA: Diagnosis not present

## 2018-03-07 DIAGNOSIS — N183 Chronic kidney disease, stage 3 (moderate): Secondary | ICD-10-CM | POA: Diagnosis not present

## 2018-03-09 NOTE — Progress Notes (Signed)
Reviewed faxed lab results, to be scanned. Agree with Cassell Smiles, AGPCNP-BC management as below. Also as I have mentioned in prior documentation, this should be managed by patient's Urologist through [facility]. In future if he needs closer Urology we can try to arrange local referral if preferred.  Nobie Putnam, [facility] 03/09/2018, 1:46 PM

## 2018-03-11 DIAGNOSIS — E11649 Type 2 diabetes mellitus with hypoglycemia without coma: Secondary | ICD-10-CM | POA: Diagnosis not present

## 2018-03-11 DIAGNOSIS — Z9181 History of falling: Secondary | ICD-10-CM | POA: Diagnosis not present

## 2018-03-11 DIAGNOSIS — E1122 Type 2 diabetes mellitus with diabetic chronic kidney disease: Secondary | ICD-10-CM | POA: Diagnosis not present

## 2018-03-28 ENCOUNTER — Telehealth: Payer: Self-pay | Admitting: Family Medicine

## 2018-03-28 NOTE — Telephone Encounter (Signed)
Received call today approx 10:55am on 03/28/18 by Larena Glassman nurse from [facility], she expressed concerns that patient has been witnessed and complained of shortness of breath worsening on minimal exertion now over past 1 week, seems mild at first then worsening recently, he was working with PT fairly well but had more difficulty recently, and today he had worse dyspnea on minimal exertion short distance movement. He reports that last time he felt similar with dyspnea he ended up having cardiac procedure with stent or bypass in past. He used to be followed by the [place] in [place], but has recently transferred care back to our office, and I have seen him once in 01/2018 for initial visit. He reportedly has denied active chest pain. They were asking about follow-up with [place] in office.  I advised them that given his worsening symptoms of dyspnea with known CAD and cardiac history, my recommendation was to strongly advise him to go to Hospital ED for further triage / work-up and evaluation, and once if he is ruled out for MI or cardiovascular event then they can arrange / recommend outpatient cardiology, he will need a referral to get established with an outpatient Cardiologist, but I feel that it is more important to rule out a problem first given his symptoms and history. He has declined this before by their report, but they will make this request.  Advised that he should be seen urgently now to get this triage by hospital. Especially if symptoms worsen.  Nobie Putnam, [facility] 03/28/2018, 11:48 AM

## 2018-04-05 DIAGNOSIS — Z9181 History of falling: Secondary | ICD-10-CM | POA: Diagnosis not present

## 2018-04-05 DIAGNOSIS — E1122 Type 2 diabetes mellitus with diabetic chronic kidney disease: Secondary | ICD-10-CM | POA: Diagnosis not present

## 2018-04-05 DIAGNOSIS — M6281 Muscle weakness (generalized): Secondary | ICD-10-CM | POA: Diagnosis not present

## 2018-04-05 DIAGNOSIS — I2510 Atherosclerotic heart disease of native coronary artery without angina pectoris: Secondary | ICD-10-CM | POA: Diagnosis not present

## 2018-04-05 DIAGNOSIS — N183 Chronic kidney disease, stage 3 (moderate): Secondary | ICD-10-CM | POA: Diagnosis not present

## 2018-04-05 DIAGNOSIS — M109 Gout, unspecified: Secondary | ICD-10-CM | POA: Diagnosis not present

## 2018-04-05 DIAGNOSIS — J849 Interstitial pulmonary disease, unspecified: Secondary | ICD-10-CM | POA: Diagnosis not present

## 2018-04-05 DIAGNOSIS — F0390 Unspecified dementia without behavioral disturbance: Secondary | ICD-10-CM | POA: Diagnosis not present

## 2018-04-05 DIAGNOSIS — D631 Anemia in chronic kidney disease: Secondary | ICD-10-CM | POA: Diagnosis not present

## 2018-04-05 DIAGNOSIS — Z435 Encounter for attention to cystostomy: Secondary | ICD-10-CM | POA: Diagnosis not present

## 2018-04-05 DIAGNOSIS — M545 Low back pain: Secondary | ICD-10-CM | POA: Diagnosis not present

## 2018-04-05 DIAGNOSIS — I129 Hypertensive chronic kidney disease with stage 1 through stage 4 chronic kidney disease, or unspecified chronic kidney disease: Secondary | ICD-10-CM | POA: Diagnosis not present

## 2018-04-17 DIAGNOSIS — E1122 Type 2 diabetes mellitus with diabetic chronic kidney disease: Secondary | ICD-10-CM | POA: Diagnosis not present

## 2018-04-17 DIAGNOSIS — M545 Low back pain: Secondary | ICD-10-CM | POA: Diagnosis not present

## 2018-04-17 DIAGNOSIS — Z9181 History of falling: Secondary | ICD-10-CM | POA: Diagnosis not present

## 2018-04-17 DIAGNOSIS — I129 Hypertensive chronic kidney disease with stage 1 through stage 4 chronic kidney disease, or unspecified chronic kidney disease: Secondary | ICD-10-CM | POA: Diagnosis not present

## 2018-04-17 DIAGNOSIS — N183 Chronic kidney disease, stage 3 (moderate): Secondary | ICD-10-CM | POA: Diagnosis not present

## 2018-04-17 DIAGNOSIS — F0390 Unspecified dementia without behavioral disturbance: Secondary | ICD-10-CM | POA: Diagnosis not present

## 2018-04-17 DIAGNOSIS — J849 Interstitial pulmonary disease, unspecified: Secondary | ICD-10-CM | POA: Diagnosis not present

## 2018-04-17 DIAGNOSIS — Z435 Encounter for attention to cystostomy: Secondary | ICD-10-CM | POA: Diagnosis not present

## 2018-04-17 DIAGNOSIS — D631 Anemia in chronic kidney disease: Secondary | ICD-10-CM | POA: Diagnosis not present

## 2018-04-17 DIAGNOSIS — M6281 Muscle weakness (generalized): Secondary | ICD-10-CM | POA: Diagnosis not present

## 2018-04-17 DIAGNOSIS — M109 Gout, unspecified: Secondary | ICD-10-CM | POA: Diagnosis not present

## 2018-04-17 DIAGNOSIS — I2510 Atherosclerotic heart disease of native coronary artery without angina pectoris: Secondary | ICD-10-CM | POA: Diagnosis not present

## 2018-04-28 DIAGNOSIS — E11649 Type 2 diabetes mellitus with hypoglycemia without coma: Secondary | ICD-10-CM | POA: Diagnosis not present

## 2018-04-28 DIAGNOSIS — Z9181 History of falling: Secondary | ICD-10-CM | POA: Diagnosis not present

## 2018-04-28 DIAGNOSIS — E1122 Type 2 diabetes mellitus with diabetic chronic kidney disease: Secondary | ICD-10-CM | POA: Diagnosis not present

## 2018-05-24 DIAGNOSIS — E1142 Type 2 diabetes mellitus with diabetic polyneuropathy: Secondary | ICD-10-CM | POA: Diagnosis not present

## 2018-05-24 DIAGNOSIS — L97521 Non-pressure chronic ulcer of other part of left foot limited to breakdown of skin: Secondary | ICD-10-CM | POA: Diagnosis not present

## 2018-05-24 DIAGNOSIS — B351 Tinea unguium: Secondary | ICD-10-CM | POA: Diagnosis not present

## 2018-07-03 ENCOUNTER — Telehealth: Payer: Self-pay | Admitting: Family Medicine

## 2018-07-03 NOTE — Telephone Encounter (Signed)
Anderson Malta with [facility] is checking on a statement of medical necessity for would care supplies sent back in December [phone] ext [phone]

## 2018-07-04 NOTE — Telephone Encounter (Signed)
Left message for patient to call back  

## 2018-07-05 DIAGNOSIS — L97521 Non-pressure chronic ulcer of other part of left foot limited to breakdown of skin: Secondary | ICD-10-CM | POA: Diagnosis not present

## 2018-07-05 NOTE — Telephone Encounter (Signed)
Attempted twice unable to reach [place].

## 2018-10-09 DIAGNOSIS — L851 Acquired keratosis [keratoderma] palmaris et plantaris: Secondary | ICD-10-CM | POA: Diagnosis not present

## 2018-10-09 DIAGNOSIS — E1142 Type 2 diabetes mellitus with diabetic polyneuropathy: Secondary | ICD-10-CM | POA: Diagnosis not present

## 2018-10-09 DIAGNOSIS — B351 Tinea unguium: Secondary | ICD-10-CM | POA: Diagnosis not present

## 2019-01-08 DIAGNOSIS — L851 Acquired keratosis [keratoderma] palmaris et plantaris: Secondary | ICD-10-CM | POA: Diagnosis not present

## 2019-01-08 DIAGNOSIS — B351 Tinea unguium: Secondary | ICD-10-CM | POA: Diagnosis not present

## 2019-01-08 DIAGNOSIS — E1142 Type 2 diabetes mellitus with diabetic polyneuropathy: Secondary | ICD-10-CM | POA: Diagnosis not present

## 2019-04-11 DIAGNOSIS — B351 Tinea unguium: Secondary | ICD-10-CM | POA: Diagnosis not present

## 2019-04-11 DIAGNOSIS — L851 Acquired keratosis [keratoderma] palmaris et plantaris: Secondary | ICD-10-CM | POA: Diagnosis not present

## 2019-04-11 DIAGNOSIS — E1142 Type 2 diabetes mellitus with diabetic polyneuropathy: Secondary | ICD-10-CM | POA: Diagnosis not present

## 2019-05-04 DIAGNOSIS — Z743 Need for continuous supervision: Secondary | ICD-10-CM | POA: Diagnosis not present

## 2019-05-04 DIAGNOSIS — R0902 Hypoxemia: Secondary | ICD-10-CM | POA: Diagnosis not present

## 2019-05-04 DIAGNOSIS — W19XXXA Unspecified fall, initial encounter: Secondary | ICD-10-CM | POA: Diagnosis not present

## 2019-05-04 DIAGNOSIS — M5489 Other dorsalgia: Secondary | ICD-10-CM | POA: Diagnosis not present

## 2019-05-04 DIAGNOSIS — R279 Unspecified lack of coordination: Secondary | ICD-10-CM | POA: Diagnosis not present

## 2019-08-02 DIAGNOSIS — L851 Acquired keratosis [keratoderma] palmaris et plantaris: Secondary | ICD-10-CM | POA: Diagnosis not present

## 2019-08-02 DIAGNOSIS — B351 Tinea unguium: Secondary | ICD-10-CM | POA: Diagnosis not present

## 2019-08-02 DIAGNOSIS — E1142 Type 2 diabetes mellitus with diabetic polyneuropathy: Secondary | ICD-10-CM | POA: Diagnosis not present

## 2019-09-11 DIAGNOSIS — Z9181 History of falling: Secondary | ICD-10-CM | POA: Diagnosis not present

## 2019-09-11 DIAGNOSIS — I272 Pulmonary hypertension, unspecified: Secondary | ICD-10-CM | POA: Diagnosis not present

## 2019-09-11 DIAGNOSIS — N390 Urinary tract infection, site not specified: Secondary | ICD-10-CM | POA: Diagnosis not present

## 2019-09-11 DIAGNOSIS — Z794 Long term (current) use of insulin: Secondary | ICD-10-CM | POA: Diagnosis not present

## 2019-09-11 DIAGNOSIS — E1122 Type 2 diabetes mellitus with diabetic chronic kidney disease: Secondary | ICD-10-CM | POA: Diagnosis not present

## 2019-09-11 DIAGNOSIS — I130 Hypertensive heart and chronic kidney disease with heart failure and stage 1 through stage 4 chronic kidney disease, or unspecified chronic kidney disease: Secondary | ICD-10-CM | POA: Diagnosis not present

## 2019-09-11 DIAGNOSIS — I5033 Acute on chronic diastolic (congestive) heart failure: Secondary | ICD-10-CM | POA: Diagnosis not present

## 2019-09-11 DIAGNOSIS — N189 Chronic kidney disease, unspecified: Secondary | ICD-10-CM | POA: Diagnosis not present

## 2019-09-11 DIAGNOSIS — E11319 Type 2 diabetes mellitus with unspecified diabetic retinopathy without macular edema: Secondary | ICD-10-CM | POA: Diagnosis not present

## 2019-09-11 DIAGNOSIS — K219 Gastro-esophageal reflux disease without esophagitis: Secondary | ICD-10-CM | POA: Diagnosis not present

## 2019-09-11 DIAGNOSIS — E1140 Type 2 diabetes mellitus with diabetic neuropathy, unspecified: Secondary | ICD-10-CM | POA: Diagnosis not present

## 2019-09-11 DIAGNOSIS — I509 Heart failure, unspecified: Secondary | ICD-10-CM | POA: Diagnosis not present

## 2019-09-11 DIAGNOSIS — J8410 Pulmonary fibrosis, unspecified: Secondary | ICD-10-CM | POA: Diagnosis not present

## 2019-09-11 DIAGNOSIS — W01198S Fall on same level from slipping, tripping and stumbling with subsequent striking against other object, sequela: Secondary | ICD-10-CM | POA: Diagnosis not present

## 2019-09-11 DIAGNOSIS — I2510 Atherosclerotic heart disease of native coronary artery without angina pectoris: Secondary | ICD-10-CM | POA: Diagnosis not present

## 2019-10-11 DIAGNOSIS — E1122 Type 2 diabetes mellitus with diabetic chronic kidney disease: Secondary | ICD-10-CM | POA: Diagnosis not present

## 2019-10-11 DIAGNOSIS — I2510 Atherosclerotic heart disease of native coronary artery without angina pectoris: Secondary | ICD-10-CM | POA: Diagnosis not present

## 2019-10-11 DIAGNOSIS — Z9181 History of falling: Secondary | ICD-10-CM | POA: Diagnosis not present

## 2019-10-11 DIAGNOSIS — E1140 Type 2 diabetes mellitus with diabetic neuropathy, unspecified: Secondary | ICD-10-CM | POA: Diagnosis not present

## 2019-10-11 DIAGNOSIS — Z794 Long term (current) use of insulin: Secondary | ICD-10-CM | POA: Diagnosis not present

## 2019-10-11 DIAGNOSIS — J8410 Pulmonary fibrosis, unspecified: Secondary | ICD-10-CM | POA: Diagnosis not present

## 2019-10-11 DIAGNOSIS — I130 Hypertensive heart and chronic kidney disease with heart failure and stage 1 through stage 4 chronic kidney disease, or unspecified chronic kidney disease: Secondary | ICD-10-CM | POA: Diagnosis not present

## 2019-10-11 DIAGNOSIS — N189 Chronic kidney disease, unspecified: Secondary | ICD-10-CM | POA: Diagnosis not present

## 2019-10-11 DIAGNOSIS — W01198S Fall on same level from slipping, tripping and stumbling with subsequent striking against other object, sequela: Secondary | ICD-10-CM | POA: Diagnosis not present

## 2019-10-11 DIAGNOSIS — I272 Pulmonary hypertension, unspecified: Secondary | ICD-10-CM | POA: Diagnosis not present

## 2019-10-11 DIAGNOSIS — I5033 Acute on chronic diastolic (congestive) heart failure: Secondary | ICD-10-CM | POA: Diagnosis not present

## 2019-10-11 DIAGNOSIS — E11319 Type 2 diabetes mellitus with unspecified diabetic retinopathy without macular edema: Secondary | ICD-10-CM | POA: Diagnosis not present

## 2019-10-11 DIAGNOSIS — N390 Urinary tract infection, site not specified: Secondary | ICD-10-CM | POA: Diagnosis not present

## 2019-10-11 DIAGNOSIS — K219 Gastro-esophageal reflux disease without esophagitis: Secondary | ICD-10-CM | POA: Diagnosis not present

## 2019-11-01 DIAGNOSIS — L851 Acquired keratosis [keratoderma] palmaris et plantaris: Secondary | ICD-10-CM | POA: Diagnosis not present

## 2019-11-01 DIAGNOSIS — E1142 Type 2 diabetes mellitus with diabetic polyneuropathy: Secondary | ICD-10-CM | POA: Diagnosis not present

## 2019-11-01 DIAGNOSIS — B351 Tinea unguium: Secondary | ICD-10-CM | POA: Diagnosis not present

## 2019-11-10 DIAGNOSIS — E11319 Type 2 diabetes mellitus with unspecified diabetic retinopathy without macular edema: Secondary | ICD-10-CM | POA: Diagnosis not present

## 2019-11-10 DIAGNOSIS — Z951 Presence of aortocoronary bypass graft: Secondary | ICD-10-CM | POA: Diagnosis not present

## 2019-11-10 DIAGNOSIS — E1140 Type 2 diabetes mellitus with diabetic neuropathy, unspecified: Secondary | ICD-10-CM | POA: Diagnosis not present

## 2019-11-10 DIAGNOSIS — E782 Mixed hyperlipidemia: Secondary | ICD-10-CM | POA: Diagnosis not present

## 2019-11-10 DIAGNOSIS — J8410 Pulmonary fibrosis, unspecified: Secondary | ICD-10-CM | POA: Diagnosis not present

## 2019-11-10 DIAGNOSIS — N184 Chronic kidney disease, stage 4 (severe): Secondary | ICD-10-CM | POA: Diagnosis not present

## 2019-11-10 DIAGNOSIS — I5033 Acute on chronic diastolic (congestive) heart failure: Secondary | ICD-10-CM | POA: Diagnosis not present

## 2019-11-10 DIAGNOSIS — Z9181 History of falling: Secondary | ICD-10-CM | POA: Diagnosis not present

## 2019-11-10 DIAGNOSIS — K219 Gastro-esophageal reflux disease without esophagitis: Secondary | ICD-10-CM | POA: Diagnosis not present

## 2019-11-10 DIAGNOSIS — Z950 Presence of cardiac pacemaker: Secondary | ICD-10-CM | POA: Diagnosis not present

## 2019-11-10 DIAGNOSIS — W01198S Fall on same level from slipping, tripping and stumbling with subsequent striking against other object, sequela: Secondary | ICD-10-CM | POA: Diagnosis not present

## 2019-11-10 DIAGNOSIS — Z48812 Encounter for surgical aftercare following surgery on the circulatory system: Secondary | ICD-10-CM | POA: Diagnosis not present

## 2019-11-10 DIAGNOSIS — I4430 Unspecified atrioventricular block: Secondary | ICD-10-CM | POA: Diagnosis not present

## 2019-11-10 DIAGNOSIS — Z794 Long term (current) use of insulin: Secondary | ICD-10-CM | POA: Diagnosis not present

## 2019-11-10 DIAGNOSIS — I2510 Atherosclerotic heart disease of native coronary artery without angina pectoris: Secondary | ICD-10-CM | POA: Diagnosis not present

## 2019-11-10 DIAGNOSIS — Z87440 Personal history of urinary (tract) infections: Secondary | ICD-10-CM | POA: Diagnosis not present

## 2019-11-10 DIAGNOSIS — I272 Pulmonary hypertension, unspecified: Secondary | ICD-10-CM | POA: Diagnosis not present

## 2019-11-10 DIAGNOSIS — I130 Hypertensive heart and chronic kidney disease with heart failure and stage 1 through stage 4 chronic kidney disease, or unspecified chronic kidney disease: Secondary | ICD-10-CM | POA: Diagnosis not present

## 2019-11-10 DIAGNOSIS — E1165 Type 2 diabetes mellitus with hyperglycemia: Secondary | ICD-10-CM | POA: Diagnosis not present

## 2019-11-10 DIAGNOSIS — E1122 Type 2 diabetes mellitus with diabetic chronic kidney disease: Secondary | ICD-10-CM | POA: Diagnosis not present

## 2019-11-12 DIAGNOSIS — R52 Pain, unspecified: Secondary | ICD-10-CM | POA: Diagnosis not present

## 2019-11-12 DIAGNOSIS — R262 Difficulty in walking, not elsewhere classified: Secondary | ICD-10-CM | POA: Diagnosis not present

## 2019-12-05 DIAGNOSIS — L97511 Non-pressure chronic ulcer of other part of right foot limited to breakdown of skin: Secondary | ICD-10-CM | POA: Diagnosis not present

## 2019-12-05 DIAGNOSIS — E1142 Type 2 diabetes mellitus with diabetic polyneuropathy: Secondary | ICD-10-CM | POA: Diagnosis not present

## 2019-12-05 DIAGNOSIS — L97512 Non-pressure chronic ulcer of other part of right foot with fat layer exposed: Secondary | ICD-10-CM | POA: Diagnosis not present

## 2019-12-26 ENCOUNTER — Other Ambulatory Visit: Payer: Self-pay

## 2019-12-26 ENCOUNTER — Inpatient Hospital Stay
Admission: EM | Admit: 2019-12-26 | Discharge: 2020-01-20 | DRG: 239 | Disposition: E | Payer: No Typology Code available for payment source | Source: Ambulatory Visit | Attending: Internal Medicine | Admitting: Internal Medicine

## 2019-12-26 DIAGNOSIS — I10 Essential (primary) hypertension: Secondary | ICD-10-CM | POA: Diagnosis not present

## 2019-12-26 DIAGNOSIS — E11621 Type 2 diabetes mellitus with foot ulcer: Secondary | ICD-10-CM | POA: Diagnosis present

## 2019-12-26 DIAGNOSIS — Z0189 Encounter for other specified special examinations: Secondary | ICD-10-CM

## 2019-12-26 DIAGNOSIS — Z515 Encounter for palliative care: Secondary | ICD-10-CM

## 2019-12-26 DIAGNOSIS — G834 Cauda equina syndrome: Secondary | ICD-10-CM | POA: Diagnosis present

## 2019-12-26 DIAGNOSIS — N139 Obstructive and reflux uropathy, unspecified: Secondary | ICD-10-CM | POA: Diagnosis not present

## 2019-12-26 DIAGNOSIS — R6521 Severe sepsis with septic shock: Secondary | ICD-10-CM | POA: Diagnosis not present

## 2019-12-26 DIAGNOSIS — I5023 Acute on chronic systolic (congestive) heart failure: Secondary | ICD-10-CM | POA: Diagnosis not present

## 2019-12-26 DIAGNOSIS — E43 Unspecified severe protein-calorie malnutrition: Secondary | ICD-10-CM | POA: Diagnosis present

## 2019-12-26 DIAGNOSIS — E11628 Type 2 diabetes mellitus with other skin complications: Secondary | ICD-10-CM | POA: Diagnosis present

## 2019-12-26 DIAGNOSIS — Z6821 Body mass index (BMI) 21.0-21.9, adult: Secondary | ICD-10-CM

## 2019-12-26 DIAGNOSIS — N183 Chronic kidney disease, stage 3 unspecified: Secondary | ICD-10-CM | POA: Diagnosis not present

## 2019-12-26 DIAGNOSIS — Z9911 Dependence on respirator [ventilator] status: Secondary | ICD-10-CM

## 2019-12-26 DIAGNOSIS — Z950 Presence of cardiac pacemaker: Secondary | ICD-10-CM

## 2019-12-26 DIAGNOSIS — Z91041 Radiographic dye allergy status: Secondary | ICD-10-CM

## 2019-12-26 DIAGNOSIS — J9600 Acute respiratory failure, unspecified whether with hypoxia or hypercapnia: Secondary | ICD-10-CM

## 2019-12-26 DIAGNOSIS — E1165 Type 2 diabetes mellitus with hyperglycemia: Secondary | ICD-10-CM | POA: Diagnosis present

## 2019-12-26 DIAGNOSIS — K3189 Other diseases of stomach and duodenum: Secondary | ICD-10-CM | POA: Diagnosis not present

## 2019-12-26 DIAGNOSIS — L97423 Non-pressure chronic ulcer of left heel and midfoot with necrosis of muscle: Secondary | ICD-10-CM | POA: Diagnosis present

## 2019-12-26 DIAGNOSIS — J9602 Acute respiratory failure with hypercapnia: Secondary | ICD-10-CM | POA: Diagnosis not present

## 2019-12-26 DIAGNOSIS — L03116 Cellulitis of left lower limb: Secondary | ICD-10-CM | POA: Diagnosis not present

## 2019-12-26 DIAGNOSIS — E1169 Type 2 diabetes mellitus with other specified complication: Secondary | ICD-10-CM | POA: Diagnosis present

## 2019-12-26 DIAGNOSIS — L97421 Non-pressure chronic ulcer of left heel and midfoot limited to breakdown of skin: Secondary | ICD-10-CM

## 2019-12-26 DIAGNOSIS — N170 Acute kidney failure with tubular necrosis: Secondary | ICD-10-CM | POA: Diagnosis not present

## 2019-12-26 DIAGNOSIS — I440 Atrioventricular block, first degree: Secondary | ICD-10-CM | POA: Diagnosis present

## 2019-12-26 DIAGNOSIS — R570 Cardiogenic shock: Secondary | ICD-10-CM | POA: Diagnosis present

## 2019-12-26 DIAGNOSIS — M869 Osteomyelitis, unspecified: Secondary | ICD-10-CM | POA: Diagnosis present

## 2019-12-26 DIAGNOSIS — I5084 End stage heart failure: Secondary | ICD-10-CM | POA: Diagnosis not present

## 2019-12-26 DIAGNOSIS — T402X5A Adverse effect of other opioids, initial encounter: Secondary | ICD-10-CM | POA: Diagnosis not present

## 2019-12-26 DIAGNOSIS — K5903 Drug induced constipation: Secondary | ICD-10-CM | POA: Diagnosis not present

## 2019-12-26 DIAGNOSIS — K3184 Gastroparesis: Secondary | ICD-10-CM | POA: Diagnosis present

## 2019-12-26 DIAGNOSIS — E08621 Diabetes mellitus due to underlying condition with foot ulcer: Secondary | ICD-10-CM | POA: Diagnosis not present

## 2019-12-26 DIAGNOSIS — S91302A Unspecified open wound, left foot, initial encounter: Secondary | ICD-10-CM | POA: Diagnosis not present

## 2019-12-26 DIAGNOSIS — E1159 Type 2 diabetes mellitus with other circulatory complications: Secondary | ICD-10-CM | POA: Diagnosis not present

## 2019-12-26 DIAGNOSIS — N1832 Chronic kidney disease, stage 3b: Secondary | ICD-10-CM | POA: Diagnosis present

## 2019-12-26 DIAGNOSIS — Z978 Presence of other specified devices: Secondary | ICD-10-CM

## 2019-12-26 DIAGNOSIS — Z66 Do not resuscitate: Secondary | ICD-10-CM | POA: Diagnosis not present

## 2019-12-26 DIAGNOSIS — E222 Syndrome of inappropriate secretion of antidiuretic hormone: Secondary | ICD-10-CM | POA: Diagnosis not present

## 2019-12-26 DIAGNOSIS — Z20822 Contact with and (suspected) exposure to covid-19: Secondary | ICD-10-CM | POA: Diagnosis present

## 2019-12-26 DIAGNOSIS — K6389 Other specified diseases of intestine: Secondary | ICD-10-CM | POA: Diagnosis not present

## 2019-12-26 DIAGNOSIS — I255 Ischemic cardiomyopathy: Secondary | ICD-10-CM | POA: Diagnosis present

## 2019-12-26 DIAGNOSIS — Z951 Presence of aortocoronary bypass graft: Secondary | ICD-10-CM

## 2019-12-26 DIAGNOSIS — Z79899 Other long term (current) drug therapy: Secondary | ICD-10-CM

## 2019-12-26 DIAGNOSIS — Z7189 Other specified counseling: Secondary | ICD-10-CM

## 2019-12-26 DIAGNOSIS — L89150 Pressure ulcer of sacral region, unstageable: Secondary | ICD-10-CM | POA: Diagnosis not present

## 2019-12-26 DIAGNOSIS — N138 Other obstructive and reflux uropathy: Secondary | ICD-10-CM | POA: Diagnosis present

## 2019-12-26 DIAGNOSIS — Z4682 Encounter for fitting and adjustment of non-vascular catheter: Secondary | ICD-10-CM | POA: Diagnosis not present

## 2019-12-26 DIAGNOSIS — Z01818 Encounter for other preprocedural examination: Secondary | ICD-10-CM

## 2019-12-26 DIAGNOSIS — T83510A Infection and inflammatory reaction due to cystostomy catheter, initial encounter: Secondary | ICD-10-CM | POA: Diagnosis not present

## 2019-12-26 DIAGNOSIS — D631 Anemia in chronic kidney disease: Secondary | ICD-10-CM | POA: Diagnosis present

## 2019-12-26 DIAGNOSIS — K219 Gastro-esophageal reflux disease without esophagitis: Secondary | ICD-10-CM | POA: Diagnosis present

## 2019-12-26 DIAGNOSIS — K56609 Unspecified intestinal obstruction, unspecified as to partial versus complete obstruction: Secondary | ICD-10-CM

## 2019-12-26 DIAGNOSIS — R140 Abdominal distension (gaseous): Secondary | ICD-10-CM | POA: Diagnosis not present

## 2019-12-26 DIAGNOSIS — G92 Toxic encephalopathy: Secondary | ICD-10-CM | POA: Diagnosis not present

## 2019-12-26 DIAGNOSIS — E1143 Type 2 diabetes mellitus with diabetic autonomic (poly)neuropathy: Secondary | ICD-10-CM | POA: Diagnosis present

## 2019-12-26 DIAGNOSIS — I130 Hypertensive heart and chronic kidney disease with heart failure and stage 1 through stage 4 chronic kidney disease, or unspecified chronic kidney disease: Secondary | ICD-10-CM | POA: Diagnosis not present

## 2019-12-26 DIAGNOSIS — Z8249 Family history of ischemic heart disease and other diseases of the circulatory system: Secondary | ICD-10-CM

## 2019-12-26 DIAGNOSIS — I2510 Atherosclerotic heart disease of native coronary artery without angina pectoris: Secondary | ICD-10-CM | POA: Diagnosis present

## 2019-12-26 DIAGNOSIS — R339 Retention of urine, unspecified: Secondary | ICD-10-CM | POA: Diagnosis present

## 2019-12-26 DIAGNOSIS — R5383 Other fatigue: Secondary | ICD-10-CM | POA: Diagnosis not present

## 2019-12-26 DIAGNOSIS — Z83438 Family history of other disorder of lipoprotein metabolism and other lipidemia: Secondary | ICD-10-CM

## 2019-12-26 DIAGNOSIS — E1152 Type 2 diabetes mellitus with diabetic peripheral angiopathy with gangrene: Secondary | ICD-10-CM | POA: Diagnosis present

## 2019-12-26 DIAGNOSIS — I5021 Acute systolic (congestive) heart failure: Secondary | ICD-10-CM | POA: Diagnosis not present

## 2019-12-26 DIAGNOSIS — I5043 Acute on chronic combined systolic (congestive) and diastolic (congestive) heart failure: Secondary | ICD-10-CM | POA: Diagnosis not present

## 2019-12-26 DIAGNOSIS — L97523 Non-pressure chronic ulcer of other part of left foot with necrosis of muscle: Secondary | ICD-10-CM | POA: Diagnosis present

## 2019-12-26 DIAGNOSIS — Z85828 Personal history of other malignant neoplasm of skin: Secondary | ICD-10-CM

## 2019-12-26 DIAGNOSIS — J9690 Respiratory failure, unspecified, unspecified whether with hypoxia or hypercapnia: Secondary | ICD-10-CM | POA: Diagnosis not present

## 2019-12-26 DIAGNOSIS — Z89519 Acquired absence of unspecified leg below knee: Secondary | ICD-10-CM | POA: Diagnosis not present

## 2019-12-26 DIAGNOSIS — Z7982 Long term (current) use of aspirin: Secondary | ICD-10-CM

## 2019-12-26 DIAGNOSIS — Z885 Allergy status to narcotic agent status: Secondary | ICD-10-CM

## 2019-12-26 DIAGNOSIS — R112 Nausea with vomiting, unspecified: Secondary | ICD-10-CM

## 2019-12-26 DIAGNOSIS — L97509 Non-pressure chronic ulcer of other part of unspecified foot with unspecified severity: Secondary | ICD-10-CM | POA: Diagnosis not present

## 2019-12-26 DIAGNOSIS — R34 Anuria and oliguria: Secondary | ICD-10-CM | POA: Diagnosis not present

## 2019-12-26 DIAGNOSIS — A419 Sepsis, unspecified organism: Secondary | ICD-10-CM | POA: Diagnosis not present

## 2019-12-26 DIAGNOSIS — L89303 Pressure ulcer of unspecified buttock, stage 3: Secondary | ICD-10-CM | POA: Diagnosis not present

## 2019-12-26 DIAGNOSIS — N179 Acute kidney failure, unspecified: Secondary | ICD-10-CM | POA: Diagnosis not present

## 2019-12-26 DIAGNOSIS — E872 Acidosis: Secondary | ICD-10-CM | POA: Diagnosis not present

## 2019-12-26 DIAGNOSIS — E785 Hyperlipidemia, unspecified: Secondary | ICD-10-CM | POA: Diagnosis not present

## 2019-12-26 DIAGNOSIS — N184 Chronic kidney disease, stage 4 (severe): Secondary | ICD-10-CM | POA: Diagnosis not present

## 2019-12-26 DIAGNOSIS — R001 Bradycardia, unspecified: Secondary | ICD-10-CM | POA: Diagnosis present

## 2019-12-26 DIAGNOSIS — I739 Peripheral vascular disease, unspecified: Secondary | ICD-10-CM | POA: Diagnosis not present

## 2019-12-26 DIAGNOSIS — D509 Iron deficiency anemia, unspecified: Secondary | ICD-10-CM | POA: Diagnosis present

## 2019-12-26 DIAGNOSIS — N401 Enlarged prostate with lower urinary tract symptoms: Secondary | ICD-10-CM | POA: Diagnosis present

## 2019-12-26 DIAGNOSIS — Z89512 Acquired absence of left leg below knee: Secondary | ICD-10-CM | POA: Diagnosis not present

## 2019-12-26 DIAGNOSIS — I081 Rheumatic disorders of both mitral and tricuspid valves: Secondary | ICD-10-CM | POA: Diagnosis present

## 2019-12-26 DIAGNOSIS — J9601 Acute respiratory failure with hypoxia: Secondary | ICD-10-CM | POA: Diagnosis not present

## 2019-12-26 DIAGNOSIS — Z794 Long term (current) use of insulin: Secondary | ICD-10-CM | POA: Diagnosis not present

## 2019-12-26 DIAGNOSIS — E875 Hyperkalemia: Secondary | ICD-10-CM | POA: Diagnosis not present

## 2019-12-26 DIAGNOSIS — R7401 Elevation of levels of liver transaminase levels: Secondary | ICD-10-CM | POA: Diagnosis not present

## 2019-12-26 DIAGNOSIS — R338 Other retention of urine: Secondary | ICD-10-CM | POA: Diagnosis present

## 2019-12-26 DIAGNOSIS — I25118 Atherosclerotic heart disease of native coronary artery with other forms of angina pectoris: Secondary | ICD-10-CM | POA: Diagnosis not present

## 2019-12-26 DIAGNOSIS — I70244 Atherosclerosis of native arteries of left leg with ulceration of heel and midfoot: Secondary | ICD-10-CM | POA: Diagnosis present

## 2019-12-26 DIAGNOSIS — E1122 Type 2 diabetes mellitus with diabetic chronic kidney disease: Secondary | ICD-10-CM | POA: Diagnosis present

## 2019-12-26 DIAGNOSIS — R1110 Vomiting, unspecified: Secondary | ICD-10-CM

## 2019-12-26 DIAGNOSIS — E11319 Type 2 diabetes mellitus with unspecified diabetic retinopathy without macular edema: Secondary | ICD-10-CM | POA: Diagnosis present

## 2019-12-26 DIAGNOSIS — G9341 Metabolic encephalopathy: Secondary | ICD-10-CM | POA: Diagnosis not present

## 2019-12-26 DIAGNOSIS — Z87891 Personal history of nicotine dependence: Secondary | ICD-10-CM

## 2019-12-26 DIAGNOSIS — E669 Obesity, unspecified: Secondary | ICD-10-CM | POA: Diagnosis present

## 2019-12-26 DIAGNOSIS — E11649 Type 2 diabetes mellitus with hypoglycemia without coma: Secondary | ICD-10-CM | POA: Diagnosis not present

## 2019-12-26 DIAGNOSIS — L02612 Cutaneous abscess of left foot: Secondary | ICD-10-CM | POA: Diagnosis not present

## 2019-12-26 DIAGNOSIS — I509 Heart failure, unspecified: Secondary | ICD-10-CM | POA: Diagnosis not present

## 2019-12-26 DIAGNOSIS — E1142 Type 2 diabetes mellitus with diabetic polyneuropathy: Secondary | ICD-10-CM | POA: Diagnosis not present

## 2019-12-26 DIAGNOSIS — I272 Pulmonary hypertension, unspecified: Secondary | ICD-10-CM | POA: Diagnosis not present

## 2019-12-26 DIAGNOSIS — E440 Moderate protein-calorie malnutrition: Secondary | ICD-10-CM | POA: Insufficient documentation

## 2019-12-26 DIAGNOSIS — I152 Hypertension secondary to endocrine disorders: Secondary | ICD-10-CM | POA: Diagnosis present

## 2019-12-26 DIAGNOSIS — Z9359 Other cystostomy status: Secondary | ICD-10-CM

## 2019-12-26 LAB — CBC WITH DIFFERENTIAL/PLATELET
Abs Immature Granulocytes: 0.25 10*3/uL — ABNORMAL HIGH (ref 0.00–0.07)
Basophils Absolute: 0.1 10*3/uL (ref 0.0–0.1)
Basophils Relative: 0 %
Eosinophils Absolute: 0 10*3/uL (ref 0.0–0.5)
Eosinophils Relative: 0 %
HCT: 36.3 % — ABNORMAL LOW (ref 39.0–52.0)
Hemoglobin: 12.1 g/dL — ABNORMAL LOW (ref 13.0–17.0)
Immature Granulocytes: 1 %
Lymphocytes Relative: 4 %
Lymphs Abs: 1 10*3/uL (ref 0.7–4.0)
MCH: 26.2 pg (ref 26.0–34.0)
MCHC: 33.3 g/dL (ref 30.0–36.0)
MCV: 78.6 fL — ABNORMAL LOW (ref 80.0–100.0)
Monocytes Absolute: 1.1 10*3/uL — ABNORMAL HIGH (ref 0.1–1.0)
Monocytes Relative: 5 %
Neutro Abs: 20 10*3/uL — ABNORMAL HIGH (ref 1.7–7.7)
Neutrophils Relative %: 90 %
Platelets: 316 10*3/uL (ref 150–400)
RBC: 4.62 MIL/uL (ref 4.22–5.81)
RDW: 15.1 % (ref 11.5–15.5)
WBC: 22.4 10*3/uL — ABNORMAL HIGH (ref 4.0–10.5)
nRBC: 0 % (ref 0.0–0.2)

## 2019-12-26 LAB — COMPREHENSIVE METABOLIC PANEL
ALT: 27 U/L (ref 0–44)
AST: 28 U/L (ref 15–41)
Albumin: 3.2 g/dL — ABNORMAL LOW (ref 3.5–5.0)
Alkaline Phosphatase: 102 U/L (ref 38–126)
Anion gap: 12 (ref 5–15)
BUN: 40 mg/dL — ABNORMAL HIGH (ref 8–23)
CO2: 21 mmol/L — ABNORMAL LOW (ref 22–32)
Calcium: 8.6 mg/dL — ABNORMAL LOW (ref 8.9–10.3)
Chloride: 98 mmol/L (ref 98–111)
Creatinine, Ser: 1.95 mg/dL — ABNORMAL HIGH (ref 0.61–1.24)
GFR calc Af Amer: 36 mL/min — ABNORMAL LOW (ref >60)
GFR calc non Af Amer: 31 mL/min — ABNORMAL LOW (ref >60)
Glucose, Bld: 362 mg/dL — ABNORMAL HIGH (ref 70–99)
Potassium: 5 mmol/L (ref 3.5–5.1)
Sodium: 131 mmol/L — ABNORMAL LOW (ref 135–145)
Total Bilirubin: 0.7 mg/dL (ref 0.3–1.2)
Total Protein: 7.6 g/dL (ref 6.5–8.1)

## 2019-12-26 LAB — IRON AND TIBC
Iron: 7 ug/dL — ABNORMAL LOW (ref 45–182)
Saturation Ratios: 4 % — ABNORMAL LOW (ref 17.9–39.5)
TIBC: 189 ug/dL — ABNORMAL LOW (ref 250–450)
UIBC: 182 ug/dL

## 2019-12-26 LAB — URINALYSIS, COMPLETE (UACMP) WITH MICROSCOPIC
Bilirubin Urine: NEGATIVE
Glucose, UA: >=500 mg/dL — AB
Ketones, ur: NEGATIVE mg/dL
Nitrite: NEGATIVE
Protein, ur: 100 mg/dL — AB
Specific Gravity, Urine: 1.021 (ref 1.005–1.030)
WBC, UA: >50 WBC/hpf — ABNORMAL HIGH (ref 0–5)
pH: 5 (ref 5.0–8.0)

## 2019-12-26 LAB — LACTIC ACID, PLASMA
Lactic Acid, Venous: 2.9 mmol/L (ref 0.5–1.9)
Lactic Acid, Venous: 1.5 mmol/L (ref 0.5–1.9)

## 2019-12-26 LAB — FOLATE: Folate: 12.2 ng/mL (ref >5.9)

## 2019-12-26 LAB — SEDIMENTATION RATE: Sed Rate: 70 mm/hr — ABNORMAL HIGH (ref 0–20)

## 2019-12-26 LAB — GLUCOSE, CAPILLARY: Glucose-Capillary: 272 mg/dL — ABNORMAL HIGH (ref 70–99)

## 2019-12-26 LAB — FERRITIN: Ferritin: 74 ng/mL (ref 24–336)

## 2019-12-26 MED ORDER — HYDROMORPHONE HCL 1 MG/ML IJ SOLN: 0.5000 mg | INTRAMUSCULAR | Status: DC | PRN

## 2019-12-26 MED ORDER — HEPARIN SODIUM (PORCINE) 5000 UNIT/ML IJ SOLN
5000.0000 [IU] | Freq: Three times a day (TID) | INTRAMUSCULAR | Status: DC
  Administered 2019-12-26 – 2020-01-06 (×31): 5000 [IU] via SUBCUTANEOUS
  Filled 2019-12-26 (×31): qty 1

## 2019-12-26 MED ORDER — AMLODIPINE BESYLATE 10 MG PO TABS
10.0000 mg | ORAL_TABLET | Freq: Every day | ORAL | Status: DC
  Administered 2019-12-27 – 2020-01-07 (×11): 10 mg via ORAL
  Filled 2019-12-26 (×8): qty 1
  Filled 2019-12-26: qty 2
  Filled 2019-12-26 (×3): qty 1

## 2019-12-26 MED ORDER — ONDANSETRON HCL 4 MG/2ML IJ SOLN
INTRAMUSCULAR | Status: AC
  Administered 2019-12-26: 4 mg via INTRAVENOUS
  Filled 2019-12-26: qty 2

## 2019-12-26 MED ORDER — METRONIDAZOLE 500 MG PO TABS
500.0000 mg | ORAL_TABLET | Freq: Three times a day (TID) | ORAL | Status: DC
  Administered 2019-12-26 – 2019-12-30 (×11): 500 mg via ORAL
  Filled 2019-12-26 (×11): qty 1

## 2019-12-26 MED ORDER — ACETAMINOPHEN 650 MG RE SUPP: 650.0000 mg | Freq: Four times a day (QID) | RECTAL | Status: DC | PRN

## 2019-12-26 MED ORDER — ONDANSETRON HCL 4 MG PO TABS
4.0000 mg | ORAL_TABLET | Freq: Four times a day (QID) | ORAL | Status: DC | PRN
  Administered 2020-01-07 – 2020-01-09 (×4): 4 mg via ORAL
  Filled 2019-12-26 (×4): qty 1

## 2019-12-26 MED ORDER — VANCOMYCIN HCL IN DEXTROSE 1-5 GM/200ML-% IV SOLN
1000.0000 mg | Freq: Once | INTRAVENOUS | Status: AC
  Administered 2019-12-26: 1000 mg via INTRAVENOUS
  Filled 2019-12-26: qty 200

## 2019-12-26 MED ORDER — TAMSULOSIN HCL 0.4 MG PO CAPS
0.4000 mg | ORAL_CAPSULE | Freq: Every day | ORAL | Status: DC
  Administered 2019-12-27 – 2020-01-14 (×15): 0.4 mg via ORAL
  Filled 2019-12-26 (×16): qty 1

## 2019-12-26 MED ORDER — INSULIN ASPART 100 UNIT/ML ~~LOC~~ SOLN
0.0000 [IU] | Freq: Three times a day (TID) | SUBCUTANEOUS | Status: DC
  Administered 2019-12-27: 11 [IU] via SUBCUTANEOUS
  Administered 2019-12-27 – 2019-12-28 (×3): 5 [IU] via SUBCUTANEOUS
  Administered 2019-12-28 – 2019-12-29 (×2): 3 [IU] via SUBCUTANEOUS
  Administered 2019-12-29: 5 [IU] via SUBCUTANEOUS
  Administered 2019-12-30: 2 [IU] via SUBCUTANEOUS
  Administered 2019-12-31: 5 [IU] via SUBCUTANEOUS
  Administered 2020-01-01: 8 [IU] via SUBCUTANEOUS
  Administered 2020-01-01 (×2): 11 [IU] via SUBCUTANEOUS
  Administered 2020-01-02: 3 [IU] via SUBCUTANEOUS
  Administered 2020-01-03 – 2020-01-04 (×2): 5 [IU] via SUBCUTANEOUS
  Administered 2020-01-04: 7 [IU] via SUBCUTANEOUS
  Administered 2020-01-04: 3 [IU] via SUBCUTANEOUS
  Administered 2020-01-05: 5 [IU] via SUBCUTANEOUS
  Administered 2020-01-05 – 2020-01-06 (×3): 3 [IU] via SUBCUTANEOUS
  Administered 2020-01-06: 8 [IU] via SUBCUTANEOUS
  Administered 2020-01-06: 3 [IU] via SUBCUTANEOUS
  Administered 2020-01-07: 2 [IU] via SUBCUTANEOUS
  Administered 2020-01-07: 5 [IU] via SUBCUTANEOUS
  Administered 2020-01-07: 3 [IU] via SUBCUTANEOUS
  Administered 2020-01-08: 5 [IU] via SUBCUTANEOUS
  Administered 2020-01-08: 8 [IU] via SUBCUTANEOUS
  Administered 2020-01-09: 5 [IU] via SUBCUTANEOUS
  Administered 2020-01-09 (×2): 3 [IU] via SUBCUTANEOUS
  Administered 2020-01-10: 5 [IU] via SUBCUTANEOUS
  Administered 2020-01-10: 3 [IU] via SUBCUTANEOUS
  Administered 2020-01-10: 5 [IU] via SUBCUTANEOUS
  Administered 2020-01-11: 8 [IU] via SUBCUTANEOUS
  Administered 2020-01-11 (×2): 5 [IU] via SUBCUTANEOUS
  Administered 2020-01-12: 2 [IU] via SUBCUTANEOUS
  Administered 2020-01-12: 3 [IU] via SUBCUTANEOUS
  Administered 2020-01-12: 2 [IU] via SUBCUTANEOUS
  Administered 2020-01-13: 3 [IU] via SUBCUTANEOUS
  Administered 2020-01-13: 2 [IU] via SUBCUTANEOUS
  Administered 2020-01-13: 3 [IU] via SUBCUTANEOUS
  Administered 2020-01-16: 2 [IU] via SUBCUTANEOUS
  Administered 2020-01-17: 5 [IU] via SUBCUTANEOUS
  Administered 2020-01-17: 8 [IU] via SUBCUTANEOUS
  Filled 2019-12-26 (×47): qty 1

## 2019-12-26 MED ORDER — INSULIN ASPART 100 UNIT/ML ~~LOC~~ SOLN
0.0000 [IU] | Freq: Every day | SUBCUTANEOUS | Status: DC
  Administered 2019-12-26: 3 [IU] via SUBCUTANEOUS
  Filled 2019-12-26: qty 1

## 2019-12-26 MED ORDER — SENNOSIDES-DOCUSATE SODIUM 8.6-50 MG PO TABS
1.0000 | ORAL_TABLET | Freq: Every evening | ORAL | Status: DC | PRN
  Administered 2019-12-28: 1 via ORAL
  Filled 2019-12-26: qty 1

## 2019-12-26 MED ORDER — HYDROCODONE-ACETAMINOPHEN 5-325 MG PO TABS
1.0000 | ORAL_TABLET | ORAL | Status: DC | PRN
  Administered 2019-12-26: 1 via ORAL
  Administered 2019-12-27 (×2): 2 via ORAL
  Administered 2019-12-27 (×2): 1 via ORAL
  Administered 2019-12-28 – 2020-01-01 (×14): 2 via ORAL
  Administered 2020-01-01: 1 via ORAL
  Administered 2020-01-02 – 2020-01-03 (×9): 2 via ORAL
  Administered 2020-01-04: 1 via ORAL
  Administered 2020-01-04 (×4): 2 via ORAL
  Administered 2020-01-05: 1 via ORAL
  Administered 2020-01-05 – 2020-01-06 (×9): 2 via ORAL
  Administered 2020-01-07: 1 via ORAL
  Administered 2020-01-07 – 2020-01-08 (×4): 2 via ORAL
  Administered 2020-01-08 (×2): 1 via ORAL
  Filled 2019-12-26 (×14): qty 2
  Filled 2019-12-26: qty 1
  Filled 2019-12-26 (×7): qty 2
  Filled 2019-12-26: qty 1
  Filled 2019-12-26 (×7): qty 2
  Filled 2019-12-26: qty 1
  Filled 2019-12-26 (×5): qty 2
  Filled 2019-12-26 (×2): qty 1
  Filled 2019-12-26 (×4): qty 2
  Filled 2019-12-26 (×2): qty 1
  Filled 2019-12-26 (×2): qty 2
  Filled 2019-12-26 (×2): qty 1
  Filled 2019-12-26: qty 2
  Filled 2019-12-26 (×2): qty 1
  Filled 2019-12-26 (×4): qty 2

## 2019-12-26 MED ORDER — HYDROMORPHONE HCL 1 MG/ML IJ SOLN
1.0000 mg | Freq: Once | INTRAMUSCULAR | Status: AC
  Administered 2019-12-26: 1 mg via INTRAVENOUS
  Filled 2019-12-26: qty 1

## 2019-12-26 MED ORDER — ACETAMINOPHEN 325 MG PO TABS
650.0000 mg | ORAL_TABLET | Freq: Four times a day (QID) | ORAL | Status: DC | PRN
  Administered 2019-12-31 – 2020-01-12 (×7): 650 mg via ORAL
  Filled 2019-12-26 (×7): qty 2

## 2019-12-26 MED ORDER — VANCOMYCIN HCL 500 MG/100ML IV SOLN
500.0000 mg | Freq: Once | INTRAVENOUS | Status: AC
  Administered 2019-12-26: 500 mg via INTRAVENOUS
  Filled 2019-12-26: qty 100

## 2019-12-26 MED ORDER — SODIUM CHLORIDE 0.9 % IV SOLN
2.0000 g | INTRAVENOUS | Status: DC
  Administered 2019-12-26 – 2020-01-03 (×10): 2 g via INTRAVENOUS
  Filled 2019-12-26 (×3): qty 2
  Filled 2019-12-26: qty 20
  Filled 2019-12-26 (×2): qty 2
  Filled 2019-12-26: qty 20
  Filled 2019-12-26 (×2): qty 2
  Filled 2019-12-26: qty 20

## 2019-12-26 MED ORDER — ASPIRIN EC 81 MG PO TBEC
81.0000 mg | DELAYED_RELEASE_TABLET | Freq: Every day | ORAL | Status: DC
  Administered 2019-12-27 – 2020-01-10 (×14): 81 mg via ORAL
  Filled 2019-12-26 (×15): qty 1

## 2019-12-26 MED ORDER — SODIUM CHLORIDE 0.9% FLUSH: 3.0000 mL | Freq: Once | INTRAVENOUS | Status: DC

## 2019-12-26 MED ORDER — INSULIN GLARGINE 100 UNIT/ML ~~LOC~~ SOLN
18.0000 [IU] | Freq: Every day | SUBCUTANEOUS | Status: DC
  Administered 2019-12-27: 18 [IU] via SUBCUTANEOUS
  Filled 2019-12-26 (×3): qty 0.18

## 2019-12-26 MED ORDER — PIPERACILLIN-TAZOBACTAM 3.375 G IVPB 30 MIN
3.3750 g | Freq: Once | INTRAVENOUS | Status: AC
  Administered 2019-12-26: 3.375 g via INTRAVENOUS
  Filled 2019-12-26: qty 50

## 2019-12-26 MED ORDER — SIMVASTATIN 20 MG PO TABS
10.0000 mg | ORAL_TABLET | Freq: Every day | ORAL | Status: DC
  Administered 2019-12-26 – 2020-01-12 (×17): 10 mg via ORAL
  Filled 2019-12-26 (×17): qty 1

## 2019-12-26 MED ORDER — AMITRIPTYLINE HCL 10 MG PO TABS
20.0000 mg | ORAL_TABLET | Freq: Every day | ORAL | Status: DC
  Administered 2019-12-26 – 2019-12-28 (×3): 20 mg via ORAL
  Filled 2019-12-26 (×4): qty 2

## 2019-12-26 MED ORDER — ONDANSETRON HCL 4 MG/2ML IJ SOLN
4.0000 mg | Freq: Four times a day (QID) | INTRAMUSCULAR | Status: DC | PRN
  Administered 2019-12-28 – 2020-01-12 (×11): 4 mg via INTRAVENOUS
  Filled 2019-12-26 (×11): qty 2

## 2019-12-26 MED ORDER — SODIUM CHLORIDE 0.9 % IV SOLN: Freq: Once | INTRAVENOUS | Status: AC

## 2019-12-26 MED ORDER — VANCOMYCIN HCL IN DEXTROSE 1-5 GM/200ML-% IV SOLN: 1000.0000 mg | INTRAVENOUS | Status: DC

## 2019-12-26 NOTE — ED Provider Notes (Signed)
ER Provider Note       Time seen: 6:01 PM    I have reviewed the vital signs and the nursing notes.  HISTORY   Chief Complaint Skin Ulcer    HPI [name] is a 79 y.o. male with a history of cauda equina, diabetic, GERD, hypertension who presents today for ulceration to the bottom of his left foot.  Patient reports he has been having foot pain for several weeks, reports he went to see Dr. Caryl Comes today to get his calluses addressed but was told to come to the ER.  He has large ulceration on the plantar surface of his left foot with extensive redness of the foot.  He has been having severe pain, foul drainage was noted.  Past Medical History:  Diagnosis Date  . Carcinoma of skin   . Cauda equina compression ([place])   . Diabetic retinopathy ([place])   . GERD (gastroesophageal reflux disease)   . Hypertension     Past Surgical History:  Procedure Laterality Date  . BACK SURGERY  07/2012  . CARDIAC CATHETERIZATION  2005  . CATARACT EXTRACTION  2014   right eye   . 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 Cardiovascular: Negative for chest pain. Respiratory: Negative for shortness of breath. Gastrointestinal: Negative for abdominal pain, vomiting and diarrhea. Musculoskeletal: Positive for foot pain Skin: Positive for foot ulceration Neurological: Negative for headaches, focal weakness or numbness.  All systems negative/normal/unremarkable except as stated in the HPI  ____________________________________________   PHYSICAL EXAM:  VITAL SIGNS: Vitals:   12/31/2019 1524  BP: 140/67  Pulse: (!) 112  Resp: 18  Temp: 98.5 F (36.9 C)  SpO2: 100%    Constitutional: Alert and oriented. Well appearing and in no distress. Eyes: Conjunctivae are normal. Normal  extraocular movements. ENT      Head: Normocephalic and atraumatic.      Nose: No congestion/rhinnorhea.      Mouth/Throat: Mucous membranes are moist.      Neck: No stridor. Cardiovascular: Normal rate, regular rhythm. No murmurs, rubs, or gallops. Respiratory: Normal respiratory effort without tachypnea nor retractions. Breath sounds are clear and equal bilaterally. No wheezes/rales/rhonchi. Gastrointestinal: Soft and nontender. Normal bowel sounds Musculoskeletal: Extensive ulceration over the distal plantar aspect of the left foot, there is left foot erythema, especially over the medial aspect and plantar surface.  Erythema extends around the left ankle.  Wound on the plantar surface has a foul smell. Neurologic:  Normal speech and language. No gross focal neurologic deficits are appreciated.  Skin: Left foot ulceration and erythema Psychiatric: Speech and behavior are normal.  ____________________________________________   LABS (pertinent positives/negatives)  Labs Reviewed  LACTIC ACID, PLASMA - Abnormal; Notable for the following components:      Result Value   Lactic Acid, Venous 2.9 (*)    All other components within normal limits  COMPREHENSIVE METABOLIC PANEL - Abnormal; Notable for the following components:   Sodium 131 (*)    CO2 21 (*)    Glucose, Bld 362 (*)    BUN 40 (*)    Creatinine, Ser 1.95 (*)    Calcium 8.6 (*)    Albumin 3.2 (*)    GFR calc non Af Amer 31 (*)  GFR calc Af Amer 36 (*)    All other components within normal limits  CBC WITH DIFFERENTIAL/PLATELET - Abnormal; Notable for the following components:   WBC 22.4 (*)    Hemoglobin 12.1 (*)    HCT 36.3 (*)    MCV 78.6 (*)    Neutro Abs 20.0 (*)    Monocytes Absolute 1.1 (*)    Abs Immature Granulocytes 0.25 (*)    All other components within normal limits  LACTIC ACID, PLASMA  URINALYSIS, COMPLETE (UACMP) WITH MICROSCOPIC   CRITICAL CARE Performed by: Laurence Aly   Total critical  care time: 30 minutes  Critical care time was exclusive of separately billable procedures and treating other patients.  Critical care was necessary to treat or prevent imminent or life-threatening deterioration.  Critical care was time spent personally by me on the following activities: development of treatment plan with patient and/or surrogate as well as nursing, discussions with consultants, evaluation of patient's response to treatment, examination of patient, obtaining history from patient or surrogate, ordering and performing treatments and interventions, ordering and review of laboratory studies, ordering and review of radiographic studies, pulse oximetry and re-evaluation of patient's condition.  RADIOLOGY  Images were viewed by me foot DP, oblique, and lateral of the left foot taken today and reviewed by  myself reveals extensive arterial calcifications throughout the lower leg  down into the foot and digital level. Digital contractures are noted with  a relatively normal joint space and no clear evidence of any cortical  erosions around the third metatarsal phalangeal joint. Some soft tissue  defect is noted beneath the third metatarsal with possible early gas. No  evidence of fracture or dislocation.  DIFFERENTIAL DIAGNOSIS  Diabetic foot ulcer, sepsis, cellulitis, osteomyelitis  ASSESSMENT AND PLAN  Sepsis, diabetic foot ulcer   Plan: The patient had presented for left foot ulceration and pain. Patient's labs revealed significant leukocytosis with lactic acidosis and hyperglycemia indicating a diabetic foot ulcer which has led to possible sepsis.  On x-ray there is not clear signs of osteomyelitis.  We will start him on IV fluids as well as broad-spectrum IV antibiotics.  I will discuss with the hospitalist for admission.  Lenise Arena MD    Note: This note was generated in part or whole with voice recognition software. Voice recognition is usually quite accurate  but there are transcription errors that can and very often do occur. I apologize for any typographical errors that were not detected and corrected.     Earleen Newport, MD 01/11/2020 [phone]

## 2019-12-26 NOTE — ED Triage Notes (Addendum)
Pt arrives via POV from [facility] clinic for an ulcer on the bottom of the left foot. Pt reports he went to see Dr. Caryl Comes today to get his calluses shaved per normal and was told to come to the ED. Pt has black open ulcer to the bottom of the left foot with surrounding redness. The top of pt's food is also red and pt is experiencing pain for 4 days. Foot has a foul odor and has purulent drainage. Pt has diabetes

## 2019-12-26 NOTE — H&P (Signed)
History and Physical    [name] KXF:[id number] DOB: 04-Feb-1941 DOA: 12/27/2019  PCP: Olin Hauser, DO  Patient coming from: Podiatry office  I have personally briefly reviewed patient's old medical records in [place]  Chief Complaint: Left foot ulcer infection  HPI: [name] is a 79 y.o. 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  Patient states he has been following with podiatry, Dr. Cleda Mccreedy, for management of his chronic left foot ulcer.  He says he has been having significantly increased pain in his left foot over the last 4-5 days.  He reports associated chills but no fevers or diaphoresis.  He saw his podiatrist prior to admission on 12/25/2019.  Outpatient x-ray of the left foot was obtained 12/23/2019 with report copied below: DP, oblique, and lateral of the left foot taken today and reviewed by  myself reveals extensive arterial calcifications throughout the lower leg  down into the foot and digital level. Digital contractures are noted with  a relatively normal joint space and no clear evidence of any cortical  erosions around the third metatarsal phalangeal joint. Some soft tissue  defect is noted beneath the third metatarsal with possible early gas. No  evidence of fracture or dislocation.  He says his podiatrist sent to the ED for further evaluation given worsening pain, parents, and concern for worsening infection.  Of note, patient states he recently had a pacemaker placed earlier this year for management of bradycardia.  He currently denies any chest pain, palpitations, dyspnea, abdominal pain.  He has a suprapubic catheter in place chronically which  he feels is functioning well.   ED Course:  Initial vitals showed BP 140/67, pulse 112, RR 18, temp 98.5 Fahrenheit, SPO2 100% on room air.  Labs are notable for WBC 22.4, hemoglobin 12.1, platelets 316,000, sodium 131, potassium 5.0, bicarb 21, BUN 40, creatinine 1.95, serum glucose 362, lactic acid 2.9.  Patient was given 1 L normal saline, started on IV vancomycin/Zosyn, and given 1 mg IV Dilaudid.  The hospitalist service was consulted for further evaluation and management.  Review of Systems: All systems reviewed and are negative except as documented in history of present illness above.   Past Medical History:  Diagnosis Date  . Carcinoma of skin   . Cauda equina compression ([place])   . Diabetic retinopathy ([place])   . GERD (gastroesophageal reflux disease)   . Hypertension     Past Surgical History:  Procedure Laterality Date  . BACK SURGERY  07/2012  . CARDIAC CATHETERIZATION  2005  . CATARACT EXTRACTION  2014   right eye   . CORONARY ARTERY BYPASS GRAFT  2005    CABG x 7 at [place]     left knee    Social History:  reports that he quit smoking about 5 years ago. His smoking use included pipe. He quit after 45.00 years of use. He has quit using smokeless tobacco. He reports current alcohol use. He reports that he does not use drugs.  Allergies  Allergen Reactions  . Fluorescein     Other reaction(s): Nausea And Vomiting  . Ivp Dye [Iodinated Diagnostic Agents] Other (See Comments)    Reaction:  Unknown   . Morphine Nausea Only  . Other     Pain meds by mouth FA dye Unknown reaction  . Codeine Nausea And Vomiting and Rash    Family History  Problem Relation Age of Onset  . Hypertension Sister   . Hyperlipidemia Sister      Prior to Admission medications   Medication Sig Start Date End Date Taking? Authorizing Provider  acetaminophen (TYLENOL) 500 MG tablet Take 1,000 mg by mouth every 6 (six) hours as needed for mild pain, fever or  headache.    [provider]  amitriptyline (ELAVIL) 10 MG tablet Take 2 tablets (20 mg total) by mouth at bedtime. 09/22/15   [name], MD  amLODipine (NORVASC) 10 MG tablet Take 1 tablet (10 mg total) by mouth daily. 05/25/16   [name], MD  aspirin EC 81 MG tablet Take 81 mg by mouth daily.    [provider]  fluticasone (FLONASE) 50 MCG/ACT nasal spray Place 2 sprays into both nostrils daily. Use for 4-6 weeks then stop and use seasonally or as needed. 02/13/18   Karamalegos, [name], DO  Insulin Glargine (LANTUS) 100 UNIT/ML Solostar Pen Inject 62 Units into the skin daily at 10 pm.     [provider]  loratadine (CLARITIN) 10 MG tablet Take 1 tablet (10 mg total) by mouth daily. Use for 4-6 weeks then stop, and use as needed or seasonally 02/13/18   Parks Ranger, [name], DO  losartan (COZAAR) 25 MG tablet Take 1 tablet (25 mg total) by mouth daily. 05/10/16   [name], MD  omeprazole (PRILOSEC) 20 MG capsule Take 1 capsule (20 mg total) by mouth daily. 04/12/16   [name], MD  predniSONE (DELTASONE) 10 MG tablet Day 1-2 take 6 pills. Day 3 take 5 pills then reduce by 1 pill each day. 02/21/18   Mikey College, NP  simvastatin (ZOCOR) 10 MG tablet take 1 tablet by mouth once daily AT 6PM Patient taking differently: Take 10 mg by mouth at bedtime.  06/28/16   [name], MD  tamsulosin (FLOMAX) 0.4 MG CAPS capsule Take 1 capsule (0.4 mg total) by mouth daily. 08/01/15   [name], MD    Physical Exam: Vitals:   01/06/2020 1524 12/27/2019 1533  BP: 140/67   Pulse: (!) 112   Resp: 18   Temp: 98.5 F (36.9 C)   TempSrc: Oral   SpO2: 100%   Weight:  75.8 kg  Height:  '6\' 2"'$  (1.88 m)   Constitutional: Elderly man resting supine in bed, NAD, calm, comfortable Eyes: PERRL, lids and conjunctivae normal ENMT: Mucous membranes are moist. Posterior pharynx clear of any exudate or lesions.Normal  dentition.  Neck: normal, supple, no masses. Respiratory: clear to auscultation bilaterally, no wheezing, no crackles. Normal respiratory effort. No accessory muscle use.  Cardiovascular: Regular rate and rhythm, no murmurs / rubs / gallops. No extremity edema.  PPM in place left chest wall. Abdomen: Suprapubic catheter in place with clear yellow urine in the collecting bag.  No tenderness, no masses palpated. No hepatosplenomegaly. Bowel sounds positive.  Musculoskeletal: Good ROM, no contractures. Normal muscle tone.  Skin: Left plantar foot ulcer as pictured below.  Has associated erythema medial left foot. Neurologic: CN 2-12 grossly intact. Sensation intact, Strength 5/5 in all 4.  Psychiatric: Normal judgment and insight. Alert and oriented x 3. Normal mood.       Labs on  Admission: I have personally reviewed following labs and imaging studies  CBC: Recent Labs  Lab 01/19/2020 1545  WBC 22.4*  NEUTROABS 20.0*  HGB 12.1*  HCT 36.3*  MCV 78.6*  PLT 967   Basic Metabolic Panel: Recent Labs  Lab 01/02/2020 1545  NA 131*  K 5.0  CL 98  CO2 21*  GLUCOSE 362*  BUN 40*  CREATININE 1.95*  CALCIUM 8.6*   GFR: Estimated Creatinine Clearance: 31.9 mL/min (A) (by C-G formula based on SCr of 1.95 mg/dL (H)). Liver Function Tests: Recent Labs  Lab 01/13/2020 1545  AST 28  ALT 27  ALKPHOS 102  BILITOT 0.7  PROT 7.6  ALBUMIN 3.2*   No results for input(s): LIPASE, AMYLASE in the last 168 hours. No results for input(s): AMMONIA in the last 168 hours. Coagulation Profile: No results for input(s): INR, PROTIME in the last 168 hours. Cardiac Enzymes: No results for input(s): CKTOTAL, CKMB, CKMBINDEX, TROPONINI in the last 168 hours. BNP (last 3 results) No results for input(s): PROBNP in the last 8760 hours. HbA1C: No results for input(s): HGBA1C in the last 72 hours. CBG: No results for input(s): GLUCAP in the last 168 hours. Lipid Profile: No results for input(s): CHOL,  HDL, LDLCALC, TRIG, CHOLHDL, LDLDIRECT in the last 72 hours. Thyroid Function Tests: No results for input(s): TSH, T4TOTAL, FREET4, T3FREE, THYROIDAB in the last 72 hours. Anemia Panel: No results for input(s): VITAMINB12, FOLATE, FERRITIN, TIBC, IRON, RETICCTPCT in the last 72 hours. Urine analysis:    Component Value Date/Time   COLORURINE YELLOW (A) 12/29/2019 1833   APPEARANCEUR CLOUDY (A) 12/27/2019 1833   APPEARANCEUR Turbid (A) 08/06/2015 1019   LABSPEC 1.021 01/08/2020 1833   PHURINE 5.0 01/04/2020 1833   GLUCOSEU >=500 (A) 01/06/2020 1833   HGBUR SMALL (A) 12/23/2019 1833   BILIRUBINUR NEGATIVE 01/15/2020 1833   BILIRUBINUR neg 11/10/2015 1048   BILIRUBINUR Negative 08/06/2015 1019   KETONESUR NEGATIVE 01/03/2020 1833   PROTEINUR 100 (A) 12/28/2019 1833   UROBILINOGEN 0.2 11/10/2015 1048   NITRITE NEGATIVE 12/28/2019 1833   LEUKOCYTESUR MODERATE (A) 01/01/2020 1833    Radiological Exams on Admission: No results found.  EKG: V paced rhythm.  Pacing is new when compared to prior from 2017.  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  Diabetic ulcer of the left foot with associated cellulitis: Follows with podiatry, Dr. Cleda Mccreedy.  Presenting with associated infection, lactic acidosis, and leukocytosis.   Outpatient x-ray reports soft tissue defect beneath the third metatarsal with possible early gas. -Continue empiric antibiotics with IV vancomycin and ceftriaxone, oral metronidazole -Obtain MRI left foot if able to assess for osteomyelitis -Follow ESR, CRP, blood cultures -May need vascular surgery consultation pending MRI  Insulin-dependent type 2 diabetes with hyperglycemia: Reports taking Lantus 18 units daily at home.  Has not had  consistent PCP follow-up. -Continue Lantus 18 units daily, placed on moderate SSI with HS coverage and adjust as needed -Check A1c  CKD stage III: Creatinine 1.95 with EGFR 31.  No recent baseline, prior labs August 2019 showed creatinine 2.24 with EGFR 26.  CAD s/p CABG: Chronic and stable, denies any chest pain.  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  Microcytic anemia: Hemoglobin stable at 12.1 without obvious bleeding.  Will obtain anemia panel.  Hypertension: Currently stable.  Continue home amlodipine, hold losartan for now.  Hyperlipidemia: Continue simvastatin.  BPH/urinary retention with chronic suprapubic catheter: Chronic and stable, continue care.  DVT prophylaxis: Subcutaneous heparin Code Status: Full code, confirmed with patient Family Communication: Discussed with patient, he has discussed with family Disposition Plan: From home, discharge pending further evaluation/management of left foot ulcer. Consults called: None Admission status:  Status is: Inpatient  Remains inpatient appropriate because:Ongoing active pain requiring inpatient pain management, Ongoing diagnostic testing needed not appropriate for outpatient work up and IV treatments appropriate due to intensity of illness or inability to take PO   Dispo: The patient is from: Home              Anticipated d/c is to: Home              Anticipated d/c date is: 2 days pending further evaluation/management of left foot ulcer/infection.               Patient currently is not medically stable to d/c.    Zada Finders MD [facility]  If 7PM-7AM, please contact night-coverage [email or web address]  12/30/2019, 9:29 PM

## 2019-12-26 NOTE — Progress Notes (Signed)
Pharmacy Antibiotic Note  [name] is a 79 y.o. male admitted on 12/22/2019 with wound infection.  Pharmacy has been consulted for Vancomycin dosing.  Plan: Vancomycin 1 gm IV X 1 given on 7/7 @ 1900. Additional Vanc 500 mg IV X 1 ordered to make total loading dose Vanc 1500 mg.  Will order Vanc 1 gm IV Q24H to start on 7/8 @ 1900. No vanc trough currently ordered.   Height: 6\' 2"  (188 cm) Weight: 75.8 kg (167 lb) IBW/kg (Calculated) : 82.2  Temp (24hrs), Avg:98.5 F (36.9 C), Min:98.5 F (36.9 C), Max:98.5 F (36.9 C)  Recent Labs  Lab 01/04/2020 1545 12/23/2019 1833  WBC 22.4*  --   CREATININE 1.95*  --   LATICACIDVEN 2.9* 1.5    Estimated Creatinine Clearance: 31.9 mL/min (A) (by C-G formula based on SCr of 1.95 mg/dL (H)).    Allergies  Allergen Reactions  . Fluorescein     Other reaction(s): Nausea And Vomiting  . Ivp Dye [Iodinated Diagnostic Agents] Other (See Comments)    Reaction:  Unknown   . Morphine Nausea Only  . Other     Pain meds by mouth FA dye Unknown reaction  . Codeine Nausea And Vomiting and Rash    Antimicrobials this admission:   >>   >>   Dose adjustments this admission:  Microbiology results:  BCx:   UCx:   Sputum:    MRSA PCR:   Thank you for allowing pharmacy to be a part of this patient's care.  Ishanvi Mcquitty D 01/13/2020 8:43 PM

## 2019-12-27 DIAGNOSIS — L97523 Non-pressure chronic ulcer of other part of left foot with necrosis of muscle: Secondary | ICD-10-CM | POA: Diagnosis not present

## 2019-12-27 LAB — GLUCOSE, CAPILLARY
Glucose-Capillary: 309 mg/dL — ABNORMAL HIGH (ref 70–99)
Glucose-Capillary: 279 mg/dL — ABNORMAL HIGH (ref 70–99)
Glucose-Capillary: 201 mg/dL — ABNORMAL HIGH (ref 70–99)
Glucose-Capillary: 202 mg/dL — ABNORMAL HIGH (ref 70–99)

## 2019-12-27 LAB — SARS CORONAVIRUS 2 BY RT PCR (HOSPITAL ORDER, PERFORMED IN ~~LOC~~ HOSPITAL LAB): SARS Coronavirus 2: NEGATIVE

## 2019-12-27 LAB — CBC
HCT: 31.8 % — ABNORMAL LOW (ref 39.0–52.0)
Hemoglobin: 10.7 g/dL — ABNORMAL LOW (ref 13.0–17.0)
MCH: 26.4 pg (ref 26.0–34.0)
MCHC: 33.6 g/dL (ref 30.0–36.0)
MCV: 78.5 fL — ABNORMAL LOW (ref 80.0–100.0)
Platelets: 258 10*3/uL (ref 150–400)
RBC: 4.05 MIL/uL — ABNORMAL LOW (ref 4.22–5.81)
RDW: 15.1 % (ref 11.5–15.5)
WBC: 16.7 10*3/uL — ABNORMAL HIGH (ref 4.0–10.5)
nRBC: 0 % (ref 0.0–0.2)

## 2019-12-27 LAB — BASIC METABOLIC PANEL
Anion gap: 11 (ref 5–15)
BUN: 43 mg/dL — ABNORMAL HIGH (ref 8–23)
CO2: 19 mmol/L — ABNORMAL LOW (ref 22–32)
Calcium: 8.1 mg/dL — ABNORMAL LOW (ref 8.9–10.3)
Chloride: 99 mmol/L (ref 98–111)
Creatinine, Ser: 2.12 mg/dL — ABNORMAL HIGH (ref 0.61–1.24)
GFR calc Af Amer: 33 mL/min — ABNORMAL LOW (ref >60)
GFR calc non Af Amer: 28 mL/min — ABNORMAL LOW (ref >60)
Glucose, Bld: 277 mg/dL — ABNORMAL HIGH (ref 70–99)
Potassium: 4.7 mmol/L (ref 3.5–5.1)
Sodium: 129 mmol/L — ABNORMAL LOW (ref 135–145)

## 2019-12-27 LAB — VITAMIN B12: Vitamin B-12: 269 pg/mL (ref 180–914)

## 2019-12-27 LAB — HEMOGLOBIN A1C
Hgb A1c MFr Bld: 12 % — ABNORMAL HIGH (ref 4.8–5.6)
Mean Plasma Glucose: 297.7 mg/dL

## 2019-12-27 LAB — MRSA PCR SCREENING: MRSA by PCR: NEGATIVE

## 2019-12-27 LAB — PREALBUMIN: Prealbumin: 7.7 mg/dL — ABNORMAL LOW (ref 18–38)

## 2019-12-27 LAB — C-REACTIVE PROTEIN: CRP: 20.8 mg/dL — ABNORMAL HIGH (ref <1.0)

## 2019-12-27 MED ORDER — SODIUM CHLORIDE 0.9 % IV SOLN
INTRAVENOUS | Status: DC | PRN
  Administered 2019-12-27: 250 mL via INTRAVENOUS
  Administered 2019-12-29: 1000 mL via INTRAVENOUS

## 2019-12-27 MED ORDER — VANCOMYCIN HCL 750 MG/150ML IV SOLN
750.0000 mg | INTRAVENOUS | Status: DC
  Administered 2019-12-27 – 2020-01-05 (×10): 750 mg via INTRAVENOUS
  Filled 2019-12-27 (×11): qty 150

## 2019-12-27 MED ORDER — HYDROCODONE-ACETAMINOPHEN 5-325 MG PO TABS
1.0000 | ORAL_TABLET | Freq: Once | ORAL | Status: AC
  Administered 2019-12-27: 1 via ORAL
  Filled 2019-12-27: qty 1

## 2019-12-27 MED ORDER — CHLORHEXIDINE GLUCONATE CLOTH 2 % EX PADS
6.0000 | MEDICATED_PAD | Freq: Every day | CUTANEOUS | Status: DC
  Administered 2019-12-28 – 2020-01-17 (×18): 6 via TOPICAL

## 2019-12-27 NOTE — ED Notes (Signed)
Pt sitting up eating breakfast.

## 2019-12-27 NOTE — Progress Notes (Signed)
Pharmacy Antibiotic Note  [name] is a 79 y.o. male admitted on 01/01/2020 with wound infection.  Pharmacy has been consulted for Vancomycin dosing.  Plan: Vancomycin 1500mg  IV x 1 loading dose.  Start vancomycin 750mg  IV every 24 hours based on [place] Antimicrobial Dosing Guidelines.   Height: 6\' 2"  (188 cm) Weight: 75.8 kg (167 lb) IBW/kg (Calculated) : 82.2  Temp (24hrs), Avg:98 F (36.7 C), Min:97.6 F (36.4 C), Max:98.5 F (36.9 C)  Recent Labs  Lab 01/13/2020 1545 01/16/2020 1833 12/27/19 0646  WBC 22.4*  --  16.7*  CREATININE 1.95*  --  2.12*  LATICACIDVEN 2.9* 1.5  --     Estimated Creatinine Clearance: 29.3 mL/min (A) (by C-G formula based on SCr of 2.12 mg/dL (H)).    Allergies  Allergen Reactions  . Fluorescein     Other reaction(s): Nausea And Vomiting  . Ivp Dye [Iodinated Diagnostic Agents] Other (See Comments)    Reaction:  Unknown   . Morphine Nausea Only  . Other     Pain meds by mouth FA dye Unknown reaction  . Codeine Nausea And Vomiting and Rash    Antimicrobials this admission: 7/7 flagyl>>  7/7 CTX>> 7/7 vancomycin>>  7/7 Zosyn x 1   Microbiology results:  BCx: pending   MRSA PCR:   Thank you for allowing pharmacy to be a part of this patient's care.  Pernell Dupre, PharmD, BCPS Clinical Pharmacist 12/27/2019 12:20 PM

## 2019-12-27 NOTE — Progress Notes (Signed)
Inpatient Diabetes Program Recommendations  AACE/ADA: New Consensus Statement on Inpatient Glycemic Control   Target Ranges:  Prepandial:   less than 140 mg/dL      Peak postprandial:   less than 180 mg/dL (1-2 hours)      Critically ill patients:  140 - 180 mg/dL  Results for TODDRICK, SANNA (MRN [id number]) as of 12/27/2019 08:19  Ref. Range 12/30/2019 15:45 12/27/2019 06:46  Glucose Latest Ref Range: 70 - 99 mg/dL 362 (H) 277 (H)   Results for EISSA, BUCHBERGER (MRN [id number]) as of 12/27/2019 08:19  Ref. Range 12/28/2019 22:24  Glucose-Capillary Latest Ref Range: 70 - 99 mg/dL 272 (H)   Review of Glycemic Control  Diabetes history: DM2 Outpatient Diabetes medications: Lantus 18 units daily Current orders for Inpatient glycemic control: Lantus 18 units daily, Novolog 0-15 units TID with meals, Novolog 0-5 units QHS  Inpatient Diabetes Program Recommendations:    HbgA1C:  A1C in process.  NOTE: Noted consult for diabetes coordinator per lower extremity wound order set. Chart reviewed. Initial glucose 362 mg/dl on 01/14/2020 and fasting lab glucose 277 mg/dl this morning. Agree with current orders. Will follow along while inpatient.  Thanks, Barnie Alderman, RN, MSN, CDE Diabetes Coordinator Inpatient Diabetes Program [phone] (Team Pager from 8am to 5pm)

## 2019-12-27 NOTE — Progress Notes (Signed)
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   Diabetic ulcer of the left foot with associated cellulitis PVD Follows with podiatry, Dr. Alberteen Spindle.  Presenting with associated infection, lactic acidosis, and leukocytosis.  Outpatient x-ray reports soft tissue defect beneath the third metatarsal with possible early gas. -started on empiric antibiotics with IV vancomycin and ceftriaxone, oral metronidazole PLAN: --podiatry consult today --Need vascular surgery eval before surgical debridement of foot --continue empiric abx --Pain control with oral pain meds  Insulin-dependent type 2 diabetes with hyperglycemia: Reports taking Lantus 18 units daily at home.  Has not had consistent PCP follow-up. -Continue Lantus 18 units daily, placed on moderate SSI with HS coverage and adjust as needed  CKD stage III: Creatinine 1.95 with EGFR 31.  No recent baseline, prior labs August 2019 showed creatinine 2.24 with EGFR 26.  CAD s/p CABG: Chronic and stable, denies any chest pain.  First-degree AV block with bradycardia s/p PPM: EKG  shows paced rhythm.  Microcytic anemia: Hemoglobin stable at 12.1 without obvious bleeding.  Will obtain anemia panel.  Hypertension: Currently stable.  Continue home amlodipine, hold losartan for now.  Hyperlipidemia: Continue simvastatin.  BPH/urinary retention with chronic suprapubic catheter: Chronic and stable, continue care.   DVT prophylaxis: Heparin SQ Code Status: Full code  Family Communication:  Status is: inpatient Dispo:   The patient is from: home Anticipated d/c is to: home Anticipated d/c date is: >4 days Patient currently is not medically stable to d/c due to: severe pain of diabetic foot ulcer needing surgical debridement, vascular surgery eval will have to wait until Monday.   Subjective and Interval History:  Pt complained of severe left foot pain.     Objective: Vitals:   12/27/19 0956 12/27/19 1537 12/27/19 1737 12/27/19 1815  BP: 137/68 (!) 143/75 (!) 149/61   Pulse: 70 81 90   Resp: 16 20    Temp: 97.9 F (36.6 C)  98.3 F (36.8 C)   TempSrc: Oral  Oral   SpO2: 100% 100% 100%   Weight:      Height:    6\' 2"  (1.88 m)    Intake/Output Summary (Last 24 hours) at 12/27/2019 1842 Last data filed at 12/27/2019 1751 Gross per 24 hour  Intake 1094.9 ml  Output 800 ml  Net 294.9 ml   Filed Weights   01/04/2020 1533  Weight: 75.8 kg    Examination:   Constitutional: NAD, AAOx3 HEENT: conjunctivae and lids normal, EOMI CV: RRR no M,R,G. Distal pulses +2.  No cyanosis.   RESP: CTA B/L, normal respiratory effort  GI: +BS, NTND Extremities: swelling  and erythema half in left foot and half way up left lower leg. Neuro: II - XII grossly intact.  Sensation intact   Data Reviewed: I have personally reviewed following labs and imaging studies  CBC: Recent Labs  Lab 12/20/2019 1545 12/27/19 0646  WBC 22.4* 16.7*  NEUTROABS 20.0*  --   HGB 12.1* 10.7*  HCT 36.3* 31.8*  MCV 78.6* 78.5*  PLT 316 209   Basic Metabolic Panel: Recent Labs    Lab 01/04/2020 1545 12/27/19 0646  NA 131* 129*  K 5.0 4.7  CL 98 99  CO2 21* 19*  GLUCOSE 362* 277*  BUN 40* 43*  CREATININE 1.95* 2.12*  CALCIUM 8.6* 8.1*   GFR: Estimated Creatinine Clearance: 29.3 mL/min (A) (by C-G formula based on SCr of 2.12 mg/dL (H)). Liver Function Tests: Recent Labs  Lab 01/09/2020 1545  AST 28  ALT 27  ALKPHOS 102  BILITOT 0.7  PROT 7.6  ALBUMIN 3.2*   No results for input(s): LIPASE, AMYLASE in the last 168 hours. No results for input(s): AMMONIA in the last 168 hours. Coagulation Profile: No results for input(s): INR, PROTIME in the last 168 hours. Cardiac Enzymes: No results for input(s): CKTOTAL, CKMB, CKMBINDEX, TROPONINI in the last 168 hours. BNP (last 3 results) No results for input(s): PROBNP in the last 8760 hours. HbA1C: Recent Labs    12/21/2019 2048  HGBA1C 12.0*   CBG: Recent Labs  Lab 01/12/2020 2224 12/27/19 1003 12/27/19 1305 12/27/19 1742  GLUCAP 272* 309* 279* 201*   Lipid Profile: No results for input(s): CHOL, HDL, LDLCALC, TRIG, CHOLHDL, LDLDIRECT in the last 72 hours. Thyroid Function Tests: No results for input(s): TSH, T4TOTAL, FREET4, T3FREE, THYROIDAB in the last 72 hours. Anemia Panel: Recent Labs    01/03/2020 2048  VITAMINB12 269  FOLATE 12.2  FERRITIN 74  TIBC 189*  IRON 7*   Sepsis Labs: Recent Labs  Lab 12/20/2019 1545 01/18/2020 1833  LATICACIDVEN 2.9* 1.5    Recent Results (from the past 240 hour(s))  Blood Cultures x 2 sites     Status: None (Preliminary result)   Collection Time: 01/03/2020  8:48 PM   Specimen: BLOOD  Result Value Ref Range Status   Specimen Description BLOOD BLOOD LEFT FOREARM  Final   Special Requests   Final    BOTTLES DRAWN AEROBIC AND ANAEROBIC Blood Culture adequate volume   Culture   Final    NO GROWTH < 12 HOURS Performed at [facility], [address], [place], [place] [address]    Report Status PENDING  Incomplete      Radiology Studies: No  results found.   Scheduled Meds: . amitriptyline  20 mg Oral QHS  . amLODipine  10 mg Oral Daily  . aspirin EC  81 mg Oral Daily  . Chlorhexidine Gluconate Cloth  6 each Topical Daily  . heparin  5,000 Units Subcutaneous Q8H  . insulin aspart  0-15 Units Subcutaneous TID WC  . insulin glargine  18 Units Subcutaneous Daily  . metroNIDAZOLE  500 mg Oral Q8H  . simvastatin  10 mg Oral QHS  . sodium chloride flush  3 mL Intravenous Once  . tamsulosin  0.4 mg Oral Daily   Continuous Infusions: . cefTRIAXone (ROCEPHIN)  IV Stopped (01/16/2020 2101)  . vancomycin       LOS: 1 day     Enzo Bi, MD [facility] If 7PM-7AM, please contact night-coverage 12/27/2019, 6:42 PM

## 2019-12-27 NOTE — Consult Note (Signed)
[place] Nurse Consult Note: Reason for Consult: Patient referred to Ed after visit to podiatric medicine office (Dr. Cleda Mccreedy). Dr. Cleda Mccreedy has been following this patient and the full thickness wound. See photos in EMR taken yesterday. Wound type: Neuropathic, infectious Pressure Injury POA: N/A Dressing procedure/placement/frequency: I have provided conservative care orders for the Nursing staff for the care of this wound using soap and water to cleanse, rinsing with NS and patting thoroughly dry. Topical care will be with a saline moistened gauze 2x2 (opened) topped with dry gauze 4x4s and securement with a few turns of Kerlix roll gauze/paper tape. Changes are to be twice daily. I recommend offloading the foot.  I have communicated to Dr. Posey Pronto via [place] that I recommend Dr. Cleda Mccreedy or his associate be consulted for the continuing care of this patient in house and note that his Orders will supercede mine for topical care. Further surgical procedures may be indicated.  [facility] nursing team will not follow, but will remain available to this patient, the nursing and medical teams.  Please re-consult if needed. Thanks, Maudie Flakes, MSN, RN, [place], Arther Abbott  Pager# [phone]

## 2019-12-27 NOTE — ED Notes (Signed)
Messaged MD regarding pt's uncontrolled pain

## 2019-12-27 NOTE — ED Notes (Addendum)
Attempted to call report

## 2019-12-27 NOTE — TOC Initial Note (Signed)
Transition of Care [facility]) - Initial/Assessment Note    Patient Details  Name: [name] MRN: [id number] Date of Birth: [birth year]  Transition of Care [facility]) CM/SW Contact:    Anselm Pancoast, RN Phone Number:  12/27/2019, 10:15 AM  Clinical Narrative:                 Charted in error**  Expected Discharge Plan: [place] Barriers to Discharge: Continued Medical Work up   Patient Goals and CMS Choice Patient states their goals for this hospitalization and ongoing recovery are:: Get leg better and get back home      Expected Discharge Plan and Services Expected Discharge Plan: [place]       Living arrangements for the past 2 months: Single Family Home                                      Prior Living Arrangements/Services Living arrangements for the past 2 months: Single Family Home Lives with:: Self Patient language and need for interpreter reviewed:: Yes Do you feel safe going back to the place where you live?: Yes      Need for Family Participation in Patient Care: Yes (Comment) Care giver support system in place?: Yes (comment) Current home services: DME (walker, cane) Criminal Activity/Legal Involvement Pertinent to Current Situation/Hospitalization: No - Comment as needed  Activities of Daily Living      Permission Sought/Granted Permission sought to share information with : Facility Sport and exercise psychologist, Case Optician, dispensing granted to share information with : Yes, Verbal Permission Granted  Share Information with NAME: [facility] Department  Permission granted to share info w AGENCY: Potential Home Health Agencies        Emotional Assessment Appearance:: Appears stated age Attitude/Demeanor/Rapport: Crying, Engaged Affect (typically observed): Accepting, Afraid/Fearful Orientation: : Oriented to Self, Oriented to Place, Oriented to  Time, Oriented to Situation Alcohol / Substance Use: Never Used Psych  Involvement: No (comment)  Admission diagnosis:  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 ([place]) [L97.523] Patient Active Problem List   Diagnosis Date Noted   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 ([place]) 01/02/2020   Cellulitis of left foot 01/07/2020   Hyperlipidemia associated with type 2 diabetes mellitus ([place]) 12/28/2019   Recurrent falls 02/14/2018   CKD (chronic kidney disease), stage III 02/13/2018   Gallbladder disease 09/05/2015   Colon wall thickening 09/05/2015   Diverticulosis 09/05/2015   Abdominal mass 08/11/2015   CHF (congestive heart failure) ([place]) 08/11/2015   Urinary retention    Phimosis    Acute kidney failure ([place]) 07/29/2015   Type 2 diabetes mellitus with foot ulcer ([place]) 02/11/2015   Bradycardia 04/06/2013   Coronary artery disease    Hypertension associated with diabetes ([place])    PCP:  Olin Hauser, DO Pharmacy:   [facility] [address] [place], [place] [address] Phone: [phone] Fax: [phone]  [id number] [place] [place] [facility] [address] [place] [address] Phone: [phone] Fax: [phone]     Social Determinants of Health (SDOH) Interventions    Readmission Risk Interventions No flowsheet data found.

## 2019-12-27 NOTE — Consult Note (Addendum)
Reason for Consult: Infected ulceration left foot. Referring Physician: [name] is an 79 y.o. male.  HPI: This is an 79 year old male with diabetes and associated neuropathy with recent development of ulceration on his left foot which has progressed over the last few weeks to full-thickness down to the level of bone with significant infection and cellulitis.  Decision was made for admission to the hospital for more definitive treatment  Past Medical History:  Diagnosis Date  . Carcinoma of skin   . Cauda equina compression ([place])   . Diabetic retinopathy ([place])   . GERD (gastroesophageal reflux disease)   . Hypertension     Past Surgical History:  Procedure Laterality Date  . BACK SURGERY  07/2012  . CARDIAC CATHETERIZATION  2005  . CATARACT EXTRACTION  2014   right eye   . 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 Hypertension Sister   . Hyperlipidemia Sister     Social History:  reports that he quit smoking about 5 years ago. His smoking use included pipe. He quit after 45.00 years of use. He has quit using smokeless tobacco. He reports current alcohol use. He reports that he does not use drugs.  Allergies:  Allergies  Allergen Reactions  . Fluorescein     Other reaction(s): Nausea And Vomiting  . Ivp Dye [Iodinated Diagnostic Agents] Other (See Comments)    Reaction:  Unknown   . Morphine Nausea Only  . Other     Pain meds by mouth FA dye Unknown reaction  . Codeine Nausea And Vomiting and Rash    Medications:  Scheduled: . amitriptyline  20 mg Oral QHS  . amLODipine  10 mg Oral Daily  . aspirin EC  81 mg Oral Daily  . heparin  5,000 Units Subcutaneous Q8H  . insulin aspart  0-15 Units Subcutaneous TID WC  . insulin glargine  18 Units Subcutaneous Daily  . metroNIDAZOLE  500 mg Oral Q8H  . simvastatin  10 mg Oral QHS  . sodium chloride flush   3 mL Intravenous Once  . tamsulosin  0.4 mg Oral Daily    Results for orders placed or performed during the hospital encounter of 01/14/2020 (from the past 48 hour(s))  Lactic acid, plasma     Status: Abnormal   Collection Time: 01/12/2020  3:45 PM  Result Value Ref Range   Lactic Acid, Venous 2.9 (HH) 0.5 - 1.9 mmol/L    Comment: CRITICAL RESULT CALLED TO, READ BACK BY AND VERIFIED WITH Gwynn Burly RN AT 7846 ON 12/29/2019 SNG Performed at [facility], [address], [place], [place] [address]   Comprehensive metabolic panel     Status: Abnormal   Collection Time: 01/11/2020  3:45 PM  Result Value Ref Range   Sodium 131 (L) 135 - 145 mmol/L   Potassium 5.0 3.5 - 5.1 mmol/L   Chloride 98 98 - 111 mmol/L   CO2 21 (L) 22 - 32 mmol/L   Glucose, Bld 362 (H) 70 - 99 mg/dL    Comment: Glucose reference range applies only to samples taken after fasting for at least 8 hours.   BUN 40 (H) 8 - 23 mg/dL   Creatinine, Ser 1.95 (H) 0.61 - 1.24 mg/dL   Calcium 8.6 (L) 8.9 - 10.3 mg/dL  Total Protein 7.6 6.5 - 8.1 g/dL   Albumin 3.2 (L) 3.5 - 5.0 g/dL   AST 28 15 - 41 U/L   ALT 27 0 - 44 U/L   Alkaline Phosphatase 102 38 - 126 U/L   Total Bilirubin 0.7 0.3 - 1.2 mg/dL   GFR calc non Af Amer 31 (L) >60 mL/min   GFR calc Af Amer 36 (L) >60 mL/min   Anion gap 12 5 - 15    Comment: Performed at [facility], [address] [place], [place] [address]  CBC with Differential     Status: Abnormal   Collection Time: 01/09/2020  3:45 PM  Result Value Ref Range   WBC 22.4 (H) 4.0 - 10.5 K/uL   RBC 4.62 4.22 - 5.81 MIL/uL   Hemoglobin 12.1 (L) 13.0 - 17.0 g/dL   HCT 36.3 (L) 39 - 52 %   MCV 78.6 (L) 80.0 - 100.0 fL   MCH 26.2 26.0 - 34.0 pg   MCHC 33.3 30.0 - 36.0 g/dL   RDW 15.1 11.5 - 15.5 %   Platelets 316 150 - 400 K/uL   nRBC 0.0 0.0 - 0.2 %   Neutrophils Relative % 90 %   Neutro Abs 20.0 (H) 1.7 - 7.7 K/uL   Lymphocytes Relative 4 %   Lymphs Abs 1.0 0.7 - 4.0 K/uL    Monocytes Relative 5 %   Monocytes Absolute 1.1 (H) 0 - 1 K/uL   Eosinophils Relative 0 %   Eosinophils Absolute 0.0 0 - 0 K/uL   Basophils Relative 0 %   Basophils Absolute 0.1 0 - 0 K/uL   Immature Granulocytes 1 %   Abs Immature Granulocytes 0.25 (H) 0.00 - 0.07 K/uL    Comment: Performed at [facility], [address] [place], [place] [address]  Lactic acid, plasma     Status: None   Collection Time: 12/20/2019  6:33 PM  Result Value Ref Range   Lactic Acid, Venous 1.5 0.5 - 1.9 mmol/L    Comment: Performed at [facility], [address] [place], [place] [address]  Urinalysis, Complete w Microscopic     Status: Abnormal   Collection Time: 01/14/2020  6:33 PM  Result Value Ref Range   Color, Urine YELLOW (A) YELLOW   APPearance CLOUDY (A) CLEAR   Specific Gravity, Urine 1.021 1.005 - 1.030   pH 5.0 5.0 - 8.0   Glucose, UA >=500 (A) NEGATIVE mg/dL   Hgb urine dipstick SMALL (A) NEGATIVE   Bilirubin Urine NEGATIVE NEGATIVE   Ketones, ur NEGATIVE NEGATIVE mg/dL   Protein, ur 100 (A) NEGATIVE mg/dL   Nitrite NEGATIVE NEGATIVE   Leukocytes,Ua MODERATE (A) NEGATIVE   RBC / HPF 0-5 0 - 5 RBC/hpf   WBC, UA >50 (H) 0 - 5 WBC/hpf   Bacteria, UA MANY (A) NONE SEEN   Squamous Epithelial / LPF 0-5 0 - 5   WBC Clumps PRESENT     Comment: Performed at [facility], [address] [place],  [address]  Vitamin B12     Status: None   Collection Time: 01/09/2020  8:48 PM  Result Value Ref Range   Vitamin B-12 269 180 - 914 pg/mL    Comment: (NOTE) This assay is not validated for testing neonatal or myeloproliferative syndrome specimens for Vitamin B12 levels. Performed at [facility], [place] [address], [place],  [address]   Folate     Status: None  Collection Time: 01/12/2020  8:48 PM  Result Value Ref Range   Folate 12.2 >5.9 ng/mL    Comment: Performed at [facility], [address] [place], [place] [address]  Iron and TIBC      Status: Abnormal   Collection Time: 01/13/2020  8:48 PM  Result Value Ref Range   Iron 7 (L) 45 - 182 ug/dL   TIBC 189 (L) 250 - 450 ug/dL   Saturation Ratios 4 (L) 17.9 - 39.5 %   UIBC 182 ug/dL    Comment: Performed at [facility], [address] [place], [place] [address]  Ferritin     Status: None   Collection Time: 01/03/2020  8:48 PM  Result Value Ref Range   Ferritin 74 24 - 336 ng/mL    Comment: Performed at [facility], [address] [place], [place] [address]  Hemoglobin A1c     Status: Abnormal   Collection Time: 12/31/2019  8:48 PM  Result Value Ref Range   Hgb A1c MFr Bld 12.0 (H) 4.8 - 5.6 %    Comment: (NOTE) Pre diabetes:          5.7%-6.4%  Diabetes:              >6.4%  Glycemic control for   <7.0% adults with diabetes    Mean Plasma Glucose 297.7 mg/dL    Comment: Performed at [facility], [place] [address], [place], [place] [address]  Sedimentation rate     Status: Abnormal   Collection Time: 01/07/2020  8:48 PM  Result Value Ref Range   Sed Rate 70 (H) 0 - 20 mm/hr    Comment: Performed at [facility], [address] [place], [place] [address]  C-reactive protein     Status: Abnormal   Collection Time: 01/07/2020  8:48 PM  Result Value Ref Range   CRP 20.8 (H) <1.0 mg/dL    Comment: Performed at [facility], [place] [address], [place], [place] [address]  Prealbumin     Status: Abnormal   Collection Time: 01/03/2020  8:48 PM  Result Value Ref Range   Prealbumin 7.7 (L) 18 - 38 mg/dL    Comment: Performed at [facility] [address], [place], [place] [address]  Blood Cultures x 2 sites     Status: None (Preliminary result)   Collection Time: 01/05/2020  8:48 PM   Specimen: BLOOD  Result Value Ref Range   Specimen Description BLOOD BLOOD LEFT FOREARM    Special Requests      BOTTLES DRAWN AEROBIC AND ANAEROBIC Blood Culture adequate volume   Culture      NO GROWTH < 12 HOURS Performed at [facility], [address] [place], [place] [address]    Report Status PENDING   Glucose, capillary     Status: Abnormal   Collection Time: 01/12/2020 10:24 PM  Result Value Ref Range   Glucose-Capillary 272 (H) 70 - 99 mg/dL    Comment: Glucose reference range applies only to samples taken after fasting for at least 8 hours.  CBC     Status: Abnormal   Collection Time: 12/27/19  6:46 AM  Result Value Ref Range   WBC 16.7 (H) 4.0 - 10.5 K/uL   RBC 4.05 (L) 4.22 - 5.81 MIL/uL   Hemoglobin 10.7 (L) 13.0 - 17.0 g/dL   HCT 31.8 (L) 39 - 52 %   MCV 78.5 (L) 80.0 - 100.0 fL  MCH 26.4 26.0 - 34.0 pg   MCHC 33.6 30.0 - 36.0 g/dL   RDW 15.1 11.5 - 15.5 %   Platelets 258 150 - 400 K/uL   nRBC 0.0 0.0 - 0.2 %    Comment: Performed at [facility], [address] [place], [place] [address]  Basic metabolic panel     Status: Abnormal   Collection Time: 12/27/19  6:46 AM  Result Value Ref Range   Sodium 129 (L) 135 - 145 mmol/L   Potassium 4.7 3.5 - 5.1 mmol/L   Chloride 99 98 - 111 mmol/L   CO2 19 (L) 22 - 32 mmol/L   Glucose, Bld 277 (H) 70 - 99 mg/dL    Comment: Glucose reference range applies only to samples taken after fasting for at least 8 hours.   BUN 43 (H) 8 - 23 mg/dL   Creatinine, Ser 2.12 (H) 0.61 - 1.24 mg/dL   Calcium 8.1 (L) 8.9 - 10.3 mg/dL   GFR calc non Af Amer 28 (L) >60 mL/min   GFR calc Af Amer 33 (L) >60 mL/min   Anion gap 11 5 - 15    Comment: Performed at [facility], [address] [place], [place] [address]  Glucose, capillary     Status: Abnormal   Collection Time: 12/27/19 10:03 AM  Result Value Ref Range   Glucose-Capillary 309 (H) 70 - 99 mg/dL    Comment: Glucose reference range applies only to samples taken after fasting for at least 8 hours.  Glucose, capillary     Status: Abnormal   Collection Time: 12/27/19  1:05 PM  Result Value Ref Range   Glucose-Capillary 279 (H) 70 - 99 mg/dL    Comment: Glucose reference range applies only to  samples taken after fasting for at least 8 hours.    No results found.  Review of Systems  Constitutional: Positive for chills. Negative for fever.  HENT: Negative for sinus pain and sore throat.   Respiratory: Negative for cough and shortness of breath.   Cardiovascular: Negative for chest pain and palpitations.  Gastrointestinal: Negative for nausea and vomiting.  Endocrine: Negative for polydipsia and polyuria.  Genitourinary: Negative for frequency and urgency.  Musculoskeletal:       Patient relates continued significant pain with his left foot  Skin:       Patient relates redness and swelling in his left foot and leg.  Draining wound from the plantar aspect.  Neurological:       Patient does relate numbness associated with his diabetes  Psychiatric/Behavioral: Negative for confusion. The patient is not nervous/anxious.    Blood pressure (!) 143/75, pulse 81, temperature 97.9 F (36.6 C), temperature source Oral, resp. rate 20, height 6\' 2"  (1.88 m), weight 75.8 kg, SpO2 100 %. Physical Exam Cardiovascular:     Comments: DP and PT pulses could not be clearly palpated on the left.  Trace on the right. Musculoskeletal:     Comments: Stiff range of motion in the pedal joints.  Muscle testing deferred.  Neurological:     Comments: There is loss of protective threshold with a monofilament wire distally in the toes.     Assessment/Plan: Assessment: Full-thickness ulceration with necrosis left forefoot. 2.  Diabetes with associated neuropathy. 3.  Peripheral vascular disease.  Plan: Discussed with the patient that at this point it appears it will be Monday before he can have his vascular assessment.  Discussed that we will need to wait until after  his vascular procedure to proceed with debridement of the foot.  Discussed that he will most likely have to have the ulcer debrided as well as some of the underlying bone and joint to allow for healing.  Also discussed that he is at  significant risk for a higher amputation if his circulation does not appear to be adequate.  Continue with current wound care orders.  Still waiting on an inpatient bed.  I will follow-up with him throughout the weekend until more definitive treatment can be undertaken next week  Durward Fortes 12/27/2019, 5:30 PM

## 2019-12-28 ENCOUNTER — Other Ambulatory Visit: Payer: Self-pay

## 2019-12-28 DIAGNOSIS — E08621 Diabetes mellitus due to underlying condition with foot ulcer: Secondary | ICD-10-CM

## 2019-12-28 DIAGNOSIS — L97523 Non-pressure chronic ulcer of other part of left foot with necrosis of muscle: Secondary | ICD-10-CM | POA: Diagnosis not present

## 2019-12-28 DIAGNOSIS — L97421 Non-pressure chronic ulcer of left heel and midfoot limited to breakdown of skin: Secondary | ICD-10-CM

## 2019-12-28 DIAGNOSIS — R112 Nausea with vomiting, unspecified: Secondary | ICD-10-CM

## 2019-12-28 DIAGNOSIS — L03116 Cellulitis of left lower limb: Secondary | ICD-10-CM

## 2019-12-28 LAB — CBC
HCT: 30.1 % — ABNORMAL LOW (ref 39.0–52.0)
Hemoglobin: 10.3 g/dL — ABNORMAL LOW (ref 13.0–17.0)
MCH: 26.8 pg (ref 26.0–34.0)
MCHC: 34.2 g/dL (ref 30.0–36.0)
MCV: 78.4 fL — ABNORMAL LOW (ref 80.0–100.0)
Platelets: 261 10*3/uL (ref 150–400)
RBC: 3.84 MIL/uL — ABNORMAL LOW (ref 4.22–5.81)
RDW: 15.2 % (ref 11.5–15.5)
WBC: 15.3 10*3/uL — ABNORMAL HIGH (ref 4.0–10.5)
nRBC: 0 % (ref 0.0–0.2)

## 2019-12-28 LAB — GLUCOSE, CAPILLARY
Glucose-Capillary: 208 mg/dL — ABNORMAL HIGH (ref 70–99)
Glucose-Capillary: 216 mg/dL — ABNORMAL HIGH (ref 70–99)
Glucose-Capillary: 190 mg/dL — ABNORMAL HIGH (ref 70–99)
Glucose-Capillary: 108 mg/dL — ABNORMAL HIGH (ref 70–99)

## 2019-12-28 LAB — BASIC METABOLIC PANEL
Anion gap: 9 (ref 5–15)
BUN: 47 mg/dL — ABNORMAL HIGH (ref 8–23)
CO2: 21 mmol/L — ABNORMAL LOW (ref 22–32)
Calcium: 8.1 mg/dL — ABNORMAL LOW (ref 8.9–10.3)
Chloride: 100 mmol/L (ref 98–111)
Creatinine, Ser: 2.01 mg/dL — ABNORMAL HIGH (ref 0.61–1.24)
GFR calc Af Amer: 35 mL/min — ABNORMAL LOW (ref >60)
GFR calc non Af Amer: 30 mL/min — ABNORMAL LOW (ref >60)
Glucose, Bld: 239 mg/dL — ABNORMAL HIGH (ref 70–99)
Potassium: 4.2 mmol/L (ref 3.5–5.1)
Sodium: 130 mmol/L — ABNORMAL LOW (ref 135–145)

## 2019-12-28 LAB — MAGNESIUM: Magnesium: 1.3 mg/dL — ABNORMAL LOW (ref 1.7–2.4)

## 2019-12-28 MED ORDER — ALUM & MAG HYDROXIDE-SIMETH 200-200-20 MG/5ML PO SUSP
15.0000 mL | Freq: Four times a day (QID) | ORAL | Status: DC | PRN
  Administered 2019-12-28 – 2020-01-04 (×4): 15 mL via ORAL
  Filled 2019-12-28 (×5): qty 30

## 2019-12-28 MED ORDER — INSULIN GLARGINE 100 UNIT/ML ~~LOC~~ SOLN
20.0000 [IU] | Freq: Every day | SUBCUTANEOUS | Status: DC
  Administered 2019-12-28 – 2019-12-29 (×2): 20 [IU] via SUBCUTANEOUS
  Filled 2019-12-28 (×3): qty 0.2

## 2019-12-28 MED ORDER — POLYETHYLENE GLYCOL 3350 17 G PO PACK
17.0000 g | PACK | Freq: Two times a day (BID) | ORAL | Status: DC | PRN
  Administered 2019-12-29: 17 g via ORAL
  Filled 2019-12-28: qty 1

## 2019-12-28 MED ORDER — INSULIN ASPART 100 UNIT/ML ~~LOC~~ SOLN
7.0000 [IU] | Freq: Three times a day (TID) | SUBCUTANEOUS | Status: DC
  Administered 2019-12-28 – 2020-01-01 (×11): 7 [IU] via SUBCUTANEOUS
  Filled 2019-12-28 (×11): qty 1

## 2019-12-28 MED ORDER — DOCUSATE SODIUM 100 MG PO CAPS
100.0000 mg | ORAL_CAPSULE | Freq: Two times a day (BID) | ORAL | Status: DC | PRN
  Administered 2019-12-29 – 2019-12-31 (×3): 100 mg via ORAL
  Filled 2019-12-28 (×4): qty 1

## 2019-12-28 MED ORDER — JUVEN PO PACK
1.0000 | PACK | Freq: Two times a day (BID) | ORAL | Status: DC
  Administered 2019-12-28 – 2020-01-10 (×18): 1 via ORAL

## 2019-12-28 MED ORDER — ENSURE MAX PROTEIN PO LIQD
11.0000 [oz_av] | Freq: Every day | ORAL | Status: DC
  Administered 2019-12-28 – 2020-01-10 (×10): 11 [oz_av] via ORAL
  Filled 2019-12-28: qty 330

## 2019-12-28 MED ORDER — MAGNESIUM SULFATE 2 GM/50ML IV SOLN
2.0000 g | INTRAVENOUS | Status: AC
  Administered 2019-12-28 (×2): 2 g via INTRAVENOUS
  Filled 2019-12-28 (×2): qty 50

## 2019-12-28 MED ORDER — GUAIFENESIN-DM 100-10 MG/5ML PO SYRP: 10.0000 mL | ORAL_SOLUTION | Freq: Four times a day (QID) | ORAL | Status: DC | PRN

## 2019-12-28 MED ORDER — ADULT MULTIVITAMIN W/MINERALS CH
1.0000 | ORAL_TABLET | Freq: Every day | ORAL | Status: DC
  Administered 2019-12-28 – 2020-01-10 (×12): 1 via ORAL
  Filled 2019-12-28 (×14): qty 1

## 2019-12-28 MED ORDER — CALCIUM CARBONATE ANTACID 500 MG PO CHEW
1.0000 | CHEWABLE_TABLET | Freq: Three times a day (TID) | ORAL | Status: DC | PRN
  Administered 2019-12-28 – 2020-01-12 (×8): 200 mg via ORAL
  Filled 2019-12-28 (×8): qty 1

## 2019-12-28 NOTE — Progress Notes (Signed)
Pharmacy Antibiotic Note  [name] is a 79 y.o. male admitted on 01/11/2020 with wound infection.  Pharmacy has been consulted for Vancomycin dosing.  7/9 Patient is currently on day 3 of Ceftriaxone, Metronidazole, and Vancomycin.   Plan: continue vancomycin 750mg  IV every 24 hours based on [place] Antimicrobial Dosing Guidelines.   Height: 6\' 2"  (188 cm) Weight: 75.8 kg (167 lb) IBW/kg (Calculated) : 82.2  Temp (24hrs), Avg:98.4 F (36.9 C), Min:98.2 F (36.8 C), Max:98.6 F (37 C)  Recent Labs  Lab 01/18/2020 1545 01/06/2020 1833 12/27/19 0646 12/28/19 0518  WBC 22.4*  --  16.7* 15.3*  CREATININE 1.95*  --  2.12* 2.01*  LATICACIDVEN 2.9* 1.5  --   --     Estimated Creatinine Clearance: 30.9 mL/min (A) (by C-G formula based on SCr of 2.01 mg/dL (H)).    Allergies  Allergen Reactions  . Fluorescein     Other reaction(s): Nausea And Vomiting  . Ivp Dye [Iodinated Diagnostic Agents] Other (See Comments)    Reaction:  Unknown   . Morphine Nausea Only  . Other     Pain meds by mouth FA dye Unknown reaction  . Codeine Nausea And Vomiting and Rash    Antimicrobials this admission: 7/7 flagyl>>  7/7 CTX>> 7/7 vancomycin>>  7/7 Zosyn x 1   Microbiology results:  BCx: pending   MRSA PCR:   Thank you for allowing pharmacy to be a part of this patient's care.  Paulina Fusi, PharmD, BCPS 12/28/2019 4:43 PM

## 2019-12-28 NOTE — TOC Initial Note (Signed)
Transition of Care [facility]) - Initial/Assessment Note    Patient Details  Name: [name] MRN: [id number] Date of Birth: [birth year]  [facility] CM/SW Contact:    Su Hilt, RN Phone Number: 12/28/2019, 1:17 PM  Clinical Narrative:                 Met with the patient to discuss DC plan and needs, he lives in [place], He ises [facility] for transport to the Doctor and local transport uses the Shuttle from the ALF He has a Corporate investment banker and a rolling walker, He stated that he will see Vascular on Monday to determine if he will have surgery or not, he stated that he may need to go to SNF after they determine what they will do about his foot.  TOC will continue to FOllow  Expected Discharge Plan: [place] Barriers to Discharge: Continued Medical Work up   Patient Goals and CMS Choice Patient states their goals for this hospitalization and ongoing recovery are:: Get leg better and get back home      Expected Discharge Plan and Services Expected Discharge Plan: [place]       Living arrangements for the past 2 months: Single Family Home                                      Prior Living Arrangements/Services Living arrangements for the past 2 months: Single Family Home Lives with:: Self Patient language and need for interpreter reviewed:: Yes Do you feel safe going back to the place where you live?: Yes      Need for Family Participation in Patient Care: Yes (Comment) Care giver support system in place?: Yes (comment) Current home services: DME (walker, cane) Criminal Activity/Legal Involvement Pertinent to Current Situation/Hospitalization: No - Comment as needed  Activities of Daily Living Home Assistive Devices/Equipment: Shower chair with back, Walker (specify type), Grab bars around toilet, Grab bars in shower, Raised toilet seat with rails ADL Screening (condition at time of  admission) Patient's cognitive ability adequate to safely complete daily activities?: Yes Is the patient deaf or have difficulty hearing?: No Does the patient have difficulty seeing, even when wearing glasses/contacts?: No Does the patient have difficulty concentrating, remembering, or making decisions?: No Patient able to express need for assistance with ADLs?: Yes Does the patient have difficulty dressing or bathing?: No Independently performs ADLs?: Yes (appropriate for developmental age) Does the patient have difficulty walking or climbing stairs?: Yes Weakness of Legs: Left Weakness of Arms/Hands: Both  Permission Sought/Granted Permission sought to share information with : Facility Sport and exercise psychologist, Case Optician, dispensing granted to share information with : Yes, Verbal Permission Granted  Share Information with NAME: [facility] Department  Permission granted to share info w AGENCY: Potential Home Health Agencies        Emotional Assessment Appearance:: Appears stated age Attitude/Demeanor/Rapport: Crying, Engaged Affect (typically observed): Accepting, Afraid/Fearful Orientation: : Oriented to Self, Oriented to Place, Oriented to  Time, Oriented to Situation Alcohol / Substance Use: Never Used Psych Involvement: No (comment)  Admission diagnosis:  Cellulitis of left lower extremity [L03.116] Diabetic ulcer of left midfoot associated with diabetes mellitus due to underlying condition, limited to breakdown of skin ([place]) [A21.308, L97.421] 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 ([place]) 12/30/2019  . Cellulitis of left foot 01/05/2020  . Hyperlipidemia associated with type 2 diabetes mellitus ([place]) 01/18/2020  . Recurrent falls 02/14/2018  . CKD (chronic kidney disease), stage III 02/13/2018  . Gallbladder disease 09/05/2015  . Colon wall thickening 09/05/2015   . Diverticulosis 09/05/2015  . Abdominal mass 08/11/2015  . CHF (congestive heart failure) ([place]) 08/11/2015  . Urinary retention   . Phimosis   . Acute kidney failure ([place]) 07/29/2015  . Type 2 diabetes mellitus with foot ulcer ([place]) 02/11/2015  . Bradycardia 04/06/2013  . Coronary artery disease   . Hypertension associated with diabetes ([place])    PCP:  Olin Hauser, DO Pharmacy:   [facility], [place] [address] Phone: [phone] Fax: [phone]  [id number] [place] [address] [place]: [phone]: [phone]    Social Determinants of Health (SDOH) Interventions    Readmission Risk Interventions No flowsheet data found.

## 2019-12-28 NOTE — Progress Notes (Signed)
*   Surgery Date in Future *   Subjective/Chief Complaint: Patient seen. Still complains of pain in his left foot. States he only feels good when he is sleeping.   Objective: Vital signs in last 24 hours: Temp:  [98.2 F (36.8 C)-98.6 F (37 C)] 98.5 F (36.9 C) (07/09 1643) Pulse Rate:  [78-97] 97 (07/09 1643) Resp:  [18-20] 18 (07/09 1643) BP: (131-149)/(61-76) 142/71 (07/09 1643) SpO2:  [97 %-100 %] 97 % (07/09 1643) Last BM Date: 12/25/19  Intake/Output from previous day: 07/08 0701 - 07/09 0700 In: 613.3 [I.V.:13.3; IV Piggyback:250] Out: 500 [Urine:500] Intake/Output this shift: Total I/O In: 90.9 [IV Piggyback:90.9] Out: 150 [Urine:150]  Dressing intact. Still some drainage from the plantar ulceration.  Lab Results:  Recent Labs    12/27/19 0646 12/28/19 0518  WBC 16.7* 15.3*  HGB 10.7* 10.3*  HCT 31.8* 30.1*  PLT 258 261   BMET Recent Labs    12/27/19 0646 12/28/19 0518  NA 129* 130*  K 4.7 4.2  CL 99 100  CO2 19* 21*  GLUCOSE 277* 239*  BUN 43* 47*  CREATININE 2.12* 2.01*  CALCIUM 8.1* 8.1*   PT/INR No results for input(s): LABPROT, INR in the last 72 hours. ABG No results for input(s): PHART, HCO3 in the last 72 hours.  Invalid input(s): PCO2, PO2  Studies/Results: No results found.  Anti-infectives: Anti-infectives (From admission, onward)   Start     Dose/Rate Route Frequency Ordered Stop   12/27/19 1900  vancomycin (VANCOCIN) IVPB 1000 mg/200 mL premix  Status:  Discontinued        1,000 mg 200 mL/hr over 60 Minutes Intravenous Every 24 hours 12/30/2019 2043 12/27/19 1217   12/27/19 1800  vancomycin (VANCOREADY) IVPB 750 mg/150 mL     Discontinue     750 mg 150 mL/hr over 60 Minutes Intravenous Every 24 hours 12/27/19 1217     01/08/2020 2200  metroNIDAZOLE (FLAGYL) tablet 500 mg     Discontinue     500 mg Oral Every 8 hours 12/25/2019 2009     01/12/2020 2045  vancomycin (VANCOREADY) IVPB 500 mg/100 mL        500 mg 100 mL/hr over 60  Minutes Intravenous  Once 12/30/2019 2040 12/27/19 0000   01/13/2020 2015  cefTRIAXone (ROCEPHIN) 2 g in sodium chloride 0.9 % 100 mL IVPB     Discontinue     2 g 200 mL/hr over 30 Minutes Intravenous Every 24 hours 01/14/2020 2009     12/30/2019 1815  vancomycin (VANCOCIN) IVPB 1000 mg/200 mL premix        1,000 mg 200 mL/hr over 60 Minutes Intravenous  Once 12/23/2019 1801 01/12/2020 2023   01/12/2020 1815  piperacillin-tazobactam (ZOSYN) IVPB 3.375 g        3.375 g 100 mL/hr over 30 Minutes Intravenous  Once 12/25/2019 1801 01/16/2020 1953      Assessment/Plan: s/p Procedure(s): Lower Extremity Angiography (Left) Assessment: Peripheral vascular disease with necrotic ulcer and cellulitis left foot.   Plan: Discussed with the patient has previous that it would be Monday before we can have his circulation checked out. Upon questioning patient does relate that he received a pacemaker a few months ago. At this point we will not be able to perform MRI. At this point we will continue to monitor throughout the weekend and reevaluate surgical options after his vascular procedure.  LOS: 2 days    Durward Fortes 12/28/2019

## 2019-12-28 NOTE — Progress Notes (Addendum)
Inpatient Diabetes Program Recommendations  AACE/ADA: New Consensus Statement on Inpatient Glycemic Control   Target Ranges:  Prepandial:   less than 140 mg/dL      Peak postprandial:   less than 180 mg/dL (1-2 hours)      Critically ill patients:  140 - 180 mg/dL   Results for [name], [name] (MRN [id number]) as of 12/28/2019 08:26  Ref. Range 12/27/2019 10:03 12/27/2019 13:05 12/27/2019 17:42 12/27/2019 23:13 12/28/2019 07:38  Glucose-Capillary Latest Ref Range: 70 - 99 mg/dL 309 (H) 279 (H) 201 (H) 202 (H) 208 (H)  Results for [name], [name] (MRN [id number]) as of 12/28/2019 08:26  Ref. Range 12/27/2019 22:24  Glucose-Capillary Latest Ref Range: 70 - 99 mg/dL 272 (H)  Results for [name], [name] (MRN [id number]) as of 12/28/2019 08:26  Ref. Range 01/10/2020 20:48  Hemoglobin A1C Latest Ref Range: 4.8 - 5.6 % 12.0 (H)   Review of Glycemic Control  Diabetes history: DM2 Outpatient Diabetes medications: Lantus 18 units daily Current orders for Inpatient glycemic control: Lantus 20 units daily, Novolog 7 units TID with meals for meal coverage, Novolog 0-15 units TID with meals  Inpatient Diabetes Program Recommendations:    Insulin-Correction: Please consider ordering Novolog 0-5 units QHS to correct bedtime glucose if needed which will improve fasting glucose.  HbgA1C:  A1C 12% on 12/20/2019 indicating an average glucose of 298 mg/dl over the past 2-3 months.  NOTE: Noted Lantus increased this morning and Novolog meal coverage added. Will plan to talk with patient today regarding DM control.  Addendum 12/28/19@13 :45-Spoke with patient about diabetes and home regimen for diabetes control. Patient reports taking Lantus 18 units QAM as an outpatient for diabetes control. Patient reports taking Lantus consistently. Patient notes that he checks glucose once a day in the morning and it had been running 130-140's mg/dl until he began having severe foot pain about 12 days ago. Patient states that since he has been  having the severe foot pain, his glucose has been staying over 200 mg/dl.  Discussed A1C results (12% on 12/28/2019 ) and explained that current A1C indicates an average glucose of 298 mg/dl over the past 2-3 months. Discussed glucose and A1C goals. Discussed importance of checking CBGs and maintaining good CBG control to prevent long-term and short-term complications. Stressed to the patient the importance of improving glycemic control to prevent further complications especially following surgery. Discussed impact of pain, stress, and infection on diabetes control.  Patient states that he is going to have a vascular procedure on Monday and then surgeons will discuss surgery. Patient states that he is very upset about the thought of losing his foot or a portion of his foot. Patient states that he is doing all he can do to take care of himself and he has to depend on others for transportation. Patient notes that he has been trying to stay off of his foot and he has only been getting up at home when absolutely necessary. Patient states that he had cut back to only eating once a day so he would not have to get up due to severe pain. Patient notes that he was 207 pounds 2 months ago and now he is down to 167 pounds. Discussed importance of good nutrition for DM control and for wound healing. Informed patient that our team will follow along while inpatient and will follow up with him next week.  Patient verbalized understanding of information discussed and reports no further questions at this time related to diabetes.  Thanks, Barnie Alderman, RN, MSN, CDE Diabetes Coordinator Inpatient Diabetes Program [phone] (Team Pager from 8am to 5pm)

## 2019-12-28 NOTE — Progress Notes (Signed)
Initial Nutrition Assessment  DOCUMENTATION CODES:   Not applicable  INTERVENTION:  Ensure Max po daily, each supplement provides 150 kcal and 30 grams of protein  1 packet Juven BID, each packet provides 95 calories, 2.5 grams of protein (collagen), and 9.8 grams of carbohydrate (3 grams sugar); also contains 7 grams of L-arginine and L-glutamine, 300 mg vitamin C, 15 mg vitamin E, 1.2 mcg vitamin B-12, 9.5 mg zinc, 200 mg calcium, and 1.5 g  Calcium Beta-hydroxy-Beta-methylbutyrate to support wound healing  MVI with minerals daily   NUTRITION DIAGNOSIS:   Increased nutrient needs related to wound healing (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    GOAL:   Patient will meet greater than or equal to 90% of their needs  MONITOR:   Labs, I & O's, Supplement acceptance, Weight trends, PO intake, Skin  REASON FOR ASSESSMENT:   Malnutrition Screening Tool, Consult Wound healing  ASSESSMENT:  RD working remotely.  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 Patient presented from podiatry office with complaints of significantly increased pain in left foot over the last 4-5 days.   Plans to proceed debridement of foot pending vascular assessment, per chart evaluation will be on Monday. Patient is at significant risk for a higher amputation if circulation does not appear to be adequate per podiatry. RD attempted to contact patient via phone this morning, however line was busy x 2. Will attempt to speak with patient later today as able. No documented meals at this time. He is on a heart healthy carb mod diet which restricts protein. Will order Ensure Max and MVI to aid with meeting needs  as well as Juven to support wound healing.   Non-pitting LLE edema per RN assessment.  Per care everywhere, patient weighed 192 lb on 08/02/19, 11/01/19, 12/05/19, as well as on 01/08/20. Suspect these weights were stated, noted patient weighed 199 lb on 01/08/19. This indicates a 32 lb (16%) wt loss in the past year which is insignificant, however concerning given advanced age as well as past medical history.  I/Os: +908 ml since admit   +113 ml x 24 hours UOP: 500 ml x 24 hrs Medications reviewed and include: SSI, Lantus 20 units daily, Flagyl IVPB: Rocephin, Vancomycin Mg sulfate 2g every 2 hours  Labs: CBGs 208,202,201,279,309 x 24 hrs, Na 130 (L), BUN 47 (H), Cr 2.01 (H), Mg 1.3, K 4.2 (WNL) Lab Results  Component Value Date   HGBA1C 12.0 (H) 12/25/2019     NUTRITION - FOCUSED PHYSICAL EXAM: Unable to complete at this time, RD working remotely.   Diet Order:   Diet Order            Diet heart healthy/carb modified Room service appropriate? Yes; Fluid consistency: Thin  Diet effective now                 EDUCATION NEEDS:   No education needs have been identified at this time  Skin:  Skin Assessment: Skin Integrity Issues: Skin Integrity Issues:: Diabetic Ulcer, Other (Comment) Diabetic Ulcer: left foot Other: ecchymosis;bilateral arm  Last BM:  7/09  Height:   Ht Readings from Last 1 Encounters:  12/27/19 6\' 2"  (1.88 m)    Weight:  Wt Readings from Last 1 Encounters:  12/29/2019 75.8 kg    Ideal Body Weight:  86.4 kg  BMI:  Body mass index is 21.44 kg/m.  Estimated Nutritional Needs:   Kcal:  6063-0160  Protein:  110-120  Fluid:  1.9 L   Lajuan Lines, RD, LDN Clinical Nutrition After Hours/Weekend Pager # in [phone]

## 2019-12-28 NOTE — Progress Notes (Signed)
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   Diabetic ulcer of the left foot with associated cellulitis PVD Follows with podiatry, Dr. Cleda Mccreedy.  Presenting with associated infection, lactic acidosis, and leukocytosis.  Outpatient x-ray reports soft tissue defect beneath the third metatarsal with possible early gas. -started on empiric antibiotics with IV vancomycin and ceftriaxone, oral metronidazole --podiatry consulted PLAN: --Need vascular surgery eval and intervention before surgical debridement of foot, planned for Monday --continue empiric abx IV vancomycin and ceftriaxone, oral metronidazole --Pain control with oral pain meds  Insulin-dependent type 2 diabetes with hyperglycemia: Reports taking Lantus 18 units daily at home.  Has not had consistent PCP follow-up. -increase Lantus to 20 units daily, placed on moderate SSI TID --schedule meal-time insulin 7u TID  CKD stage III: Creatinine 1.95 with EGFR 31.  No recent baseline, prior labs August 2019 showed creatinine 2.24 with EGFR 26.  CAD s/p  CABG: Chronic and stable, denies any chest pain.  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  Microcytic anemia: Hemoglobin stable at 12.1 without obvious bleeding.  Will obtain anemia panel.  Hypertension: Currently stable.  Continue home amlodipine, hold losartan for now.  Hyperlipidemia: Continue simvastatin.  BPH/urinary retention with chronic suprapubic catheter: Chronic and stable, continue care.   DVT prophylaxis: Heparin SQ Code Status: Full code  Family Communication:  Status is: inpatient Dispo:   The patient is from: home Anticipated d/c is to: home Anticipated d/c date is: >4 days Patient currently is not medically stable to d/c due to: severe pain of diabetic foot ulcer needing surgical debridement, vascular surgery eval will have to wait until Monday.   Subjective and Interval History:  Continued to complain of severe pain in his foot and that pain meds wore off before 4 hours.  Normal oral intake.  No fever.   Objective: Vitals:   12/27/19 1737 12/27/19 1815 12/27/19 2255 12/28/19 0737  BP: (!) 149/61  131/64 (!) 143/76  Pulse: 90  78 93  Resp:   20 18  Temp: 98.3 F (36.8 C)  98.2 F (36.8 C) 98.6 F (37 C)  TempSrc: Oral  Oral Oral  SpO2: 100%  97% 100%  Weight:      Height:  '6\' 2"'$  (1.88 m)      Intake/Output Summary (Last 24 hours) at 12/28/2019 1509 Last data filed at 12/28/2019 0844 Gross per 24 hour  Intake 613.3 ml  Output 650 ml  Net -36.7 ml   Filed Weights   01/09/2020 1533  Weight: 75.8 kg    Examination:   Constitutional: NAD, AAOx3  HEENT: conjunctivae and lids normal, EOMI CV: RRR no M,R,G. Distal pulses +2.  No cyanosis.   RESP: CTA B/L, normal respiratory effort  GI: +BS, NTND Extremities: swelling and erythema half in left foot and half way up left lower leg.  Left foot wrapped. Neuro: II - XII grossly intact.  Sensation intact   Data Reviewed: I have personally reviewed following labs  and imaging studies  CBC: Recent Labs  Lab 01/16/2020 1545 12/27/19 0646 12/28/19 0518  WBC 22.4* 16.7* 15.3*  NEUTROABS 20.0*  --   --   HGB 12.1* 10.7* 10.3*  HCT 36.3* 31.8* 30.1*  MCV 78.6* 78.5* 78.4*  PLT 316 258 017   Basic Metabolic Panel: Recent Labs  Lab 12/23/2019 1545 12/27/19 0646 12/28/19 0518  NA 131* 129* 130*  K 5.0 4.7 4.2  CL 98 99 100  CO2 21* 19* 21*  GLUCOSE 362* 277* 239*  BUN 40* 43* 47*  CREATININE 1.95* 2.12* 2.01*  CALCIUM 8.6* 8.1* 8.1*  MG  --   --  1.3*   GFR: Estimated Creatinine Clearance: 30.9 mL/min (A) (by C-G formula based on SCr of 2.01 mg/dL (H)). Liver Function Tests: Recent Labs  Lab 12/25/2019 1545  AST 28  ALT 27  ALKPHOS 102  BILITOT 0.7  PROT 7.6  ALBUMIN 3.2*   No results for input(s): LIPASE, AMYLASE in the last 168 hours. No results for input(s): AMMONIA in the last 168 hours. Coagulation Profile: No results for input(s): INR, PROTIME in the last 168 hours. Cardiac Enzymes: No results for input(s): CKTOTAL, CKMB, CKMBINDEX, TROPONINI in the last 168 hours. BNP (last 3 results) No results for input(s): PROBNP in the last 8760 hours. HbA1C: Recent Labs    12/30/2019 2048  HGBA1C 12.0*   CBG: Recent Labs  Lab 12/27/19 1305 12/27/19 1742 12/27/19 2313 12/28/19 0738 12/28/19 1251  GLUCAP 279* 201* 202* 208* 216*   Lipid Profile: No results for input(s): CHOL, HDL, LDLCALC, TRIG, CHOLHDL, LDLDIRECT in the last 72 hours. Thyroid Function Tests: No results for input(s): TSH, T4TOTAL, FREET4, T3FREE, THYROIDAB in the last 72 hours. Anemia Panel: Recent Labs    12/25/2019 2048  VITAMINB12 269  FOLATE 12.2  FERRITIN 74  TIBC 189*  IRON 7*   Sepsis Labs: Recent Labs  Lab 12/22/2019 1545 01/16/2020 1833  LATICACIDVEN 2.9* 1.5    Recent Results (from the past 240 hour(s))  Blood Cultures x 2 sites     Status: None (Preliminary result)   Collection Time: 01/13/2020  8:48 PM   Specimen: BLOOD  Result Value  Ref Range Status   Specimen Description BLOOD BLOOD LEFT FOREARM  Final   Special Requests   Final    BOTTLES DRAWN AEROBIC AND ANAEROBIC Blood Culture adequate volume   Culture   Final    NO GROWTH 2 DAYS Performed at [facility], [address] [place], [place] [address]    Report Status PENDING  Incomplete  Culture, blood (Routine X 2) w Reflex to ID Panel     Status: None (Preliminary result)   Collection Time: 12/27/19  7:01 PM   Specimen: BLOOD  Result Value Ref Range Status   Specimen Description BLOOD BLOOD RIGHT HAND  Final   Special Requests   Final    BOTTLES DRAWN AEROBIC AND ANAEROBIC Blood Culture adequate volume   Culture   Final    NO GROWTH < 12 HOURS Performed at [facility], [address] [place], [place] [address]  Report Status PENDING  Incomplete  SARS Coronavirus 2 by RT PCR (hospital order, performed in [facility] hospital lab) Nasopharyngeal Nasopharyngeal Swab     Status: None   Collection Time: 12/27/19  8:45 PM   Specimen: Nasopharyngeal Swab  Result Value Ref Range Status   SARS Coronavirus 2 NEGATIVE NEGATIVE Final    Comment: (NOTE) SARS-CoV-2 target nucleic acids are NOT DETECTED.  The SARS-CoV-2 RNA is generally detectable in upper and lower respiratory specimens during the acute phase of infection. The lowest concentration of SARS-CoV-2 viral copies this assay can detect is 250 copies / mL. A negative result does not preclude SARS-CoV-2 infection and should not be used as the sole basis for treatment or other patient management decisions.  A negative result may occur with improper specimen collection / handling, submission of specimen other than nasopharyngeal swab, presence of viral mutation(s) within the areas targeted by this assay, and inadequate number of viral copies (<250 copies / mL). A negative result must be combined with clinical observations, patient history, and epidemiological information.  Fact Sheet  for Patients:   StrictlyIdeas.no  Fact Sheet for Healthcare Providers: BankingDealers.co.za  This test is not yet approved or  cleared by the [place] FDA and has been authorized for detection and/or diagnosis of SARS-CoV-2 by FDA under an Emergency Use Authorization (EUA).  This EUA will remain in effect (meaning this test can be used) for the duration of the COVID-19 declaration under Section 564(b)(1) of the Act, 21 U.S.C. section 360bbb-3(b)(1), unless the authorization is terminated or revoked sooner.  Performed at [facility], [address] [place],  [address]   MRSA PCR Screening     Status: None   Collection Time: 12/27/19  8:45 PM   Specimen: Nasopharyngeal  Result Value Ref Range Status   MRSA by PCR NEGATIVE NEGATIVE Final    Comment:        The GeneXpert MRSA Assay (FDA approved for NASAL specimens only), is one component of a comprehensive MRSA colonization surveillance program. It is not intended to diagnose MRSA infection nor to guide or monitor treatment for MRSA infections. Performed at [facility], [address], [place], [place] [address]       Radiology Studies: No results found.   Scheduled Meds: . amitriptyline  20 mg Oral QHS  . amLODipine  10 mg Oral Daily  . aspirin EC  81 mg Oral Daily  . Chlorhexidine Gluconate Cloth  6 each Topical Daily  . heparin  5,000 Units Subcutaneous Q8H  . insulin aspart  0-15 Units Subcutaneous TID WC  . insulin aspart  7 Units Subcutaneous TID WC  . insulin glargine  20 Units Subcutaneous Daily  . metroNIDAZOLE  500 mg Oral Q8H  . multivitamin with minerals  1 tablet Oral Daily  . nutrition supplement (JUVEN)  1 packet Oral BID BM  . Ensure Max Protein  11 oz Oral Daily  . simvastatin  10 mg Oral QHS  . sodium chloride flush  3 mL Intravenous Once  . tamsulosin  0.4 mg Oral Daily   Continuous Infusions: . sodium chloride  Stopped (12/27/19 2343)  . cefTRIAXone (ROCEPHIN)  IV Stopped (12/27/19 2323)  . vancomycin Stopped (12/27/19 2153)     LOS: 2 days     Enzo Bi, MD [facility] If 7PM-7AM, please contact night-coverage 12/28/2019, 3:09 PM

## 2019-12-28 NOTE — Consult Note (Signed)
[facility] Vascular Consult Note  MRN : [id number]  [name] is a 79 y.o. ([birth year]) male who presents with chief complaint of  Chief Complaint  Patient presents with  . Skin Ulcer   History of Present Illness:  [name] is a 79 y.o. 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  Patient reports aggressively worsening pain to the left lower extremity specifically his left foot.  Patient has been followed by podiatry.  Patient also has a progressively worsening wound which is now progressed to full-thickness ulceration with necrosis.  Podiatry plans on taking the patient to the operating room for debridement.  Vascular surgery was consulted by Dr. Cleda Mccreedy for possible endovascular revascularization.  Current Facility-Administered Medications  Medication Dose Route Frequency Provider Last Rate Last Admin  . 0.9 %  sodium chloride infusion   Intravenous PRN Enzo Bi, MD   Stopped at 12/27/19 2343  . acetaminophen (TYLENOL) tablet 650 mg  650 mg Oral Q6H PRN Lenore Cordia, MD       Or  . acetaminophen (TYLENOL) suppository 650 mg  650 mg Rectal Q6H PRN Lenore Cordia, MD      . alum & mag hydroxide-simeth (MAALOX/MYLANTA) 200-200-20 MG/5ML suspension 15 mL  15 mL Oral Q6H PRN Enzo Bi, MD      . amitriptyline (ELAVIL) tablet 20 mg  20 mg Oral QHS Lenore Cordia, MD   20 mg at 12/27/19 2341  . amLODipine (NORVASC) tablet 10 mg  10 mg Oral Daily Lenore Cordia, MD   10 mg at 12/28/19 0845  . aspirin EC tablet 81 mg  81 mg Oral Daily Lenore Cordia, MD   81 mg at 12/28/19 0844  . calcium carbonate (TUMS - dosed in mg elemental calcium) chewable tablet 200 mg  of elemental calcium  1 tablet Oral TID PRN Enzo Bi, MD      . cefTRIAXone (ROCEPHIN) 2 g in sodium chloride 0.9 % 100 mL IVPB  2 g Intravenous Q24H Lenore Cordia, MD   Stopped at 12/27/19 2323  . Chlorhexidine Gluconate Cloth 2 % PADS 6 each  6 each Topical Daily Enzo Bi, MD   6 each at 12/28/19 [phone]  . docusate sodium (COLACE) capsule 100 mg  100 mg Oral BID PRN Enzo Bi, MD      . guaiFENesin-dextromethorphan [facility] DM) 100-10 MG/5ML syrup 10 mL  10 mL Oral Q6H PRN Enzo Bi, MD      . heparin injection 5,000 Units  5,000 Units Subcutaneous Q8H Lenore Cordia, MD   5,000 Units at 12/28/19 0514  . HYDROcodone-acetaminophen (NORCO/VICODIN) 5-325 MG per tablet 1-2 tablet  1-2 tablet Oral Q4H PRN Lenore Cordia, MD   2 tablet at 12/28/19 0851  . insulin aspart (novoLOG) injection 0-15 Units  0-15 Units Subcutaneous TID WC Lenore Cordia, MD   5 Units at 12/28/19 0846  . insulin aspart (novoLOG) injection 7 Units  7 Units Subcutaneous TID WC Enzo Bi, MD   7 Units at 12/28/19 0845  . insulin glargine (LANTUS) injection 20 Units  20 Units Subcutaneous Daily Enzo Bi, MD   20 Units at 12/28/19 1130  . magnesium sulfate IVPB  2 g 50 mL  2 g Intravenous Einar Gip, MD 50 mL/hr at 12/28/19 1129 2 g at 12/28/19 1129  . metroNIDAZOLE (FLAGYL) tablet 500 mg  500 mg Oral Q8H Zada Finders R, MD   500 mg at 12/28/19 0514  . multivitamin with minerals tablet 1 tablet  1 tablet Oral Daily Enzo Bi, MD   1 tablet at 12/28/19 1129  . nutrition supplement (JUVEN) (JUVEN) powder packet 1 packet  1 packet Oral BID BM Enzo Bi, MD   1 packet at 12/28/19 1128  . ondansetron (ZOFRAN) tablet 4 mg  4 mg Oral Q6H PRN Lenore Cordia, MD       Or  . ondansetron [facility]) injection 4 mg  4 mg Intravenous Q6H PRN Lenore Cordia, MD   4 mg at 01/03/2020 2003  . polyethylene glycol (MIRALAX / GLYCOLAX) packet 17 g  17 g Oral BID PRN Enzo Bi, MD      . protein supplement (ENSURE MAX) liquid  11 oz Oral Daily  Enzo Bi, MD   11 oz at 12/28/19 1131  . senna-docusate (Senokot-S) tablet 1 tablet  1 tablet Oral QHS PRN Zada Finders R, MD      . simvastatin (ZOCOR) tablet 10 mg  10 mg Oral QHS Lenore Cordia, MD   10 mg at 12/27/19 2259  . sodium chloride flush (NS) 0.9 % injection 3 mL  3 mL Intravenous Once Blake Divine, MD      . tamsulosin [facility]) capsule 0.4 mg  0.4 mg Oral Daily Zada Finders R, MD   0.4 mg at 12/28/19 0845  . vancomycin (VANCOREADY) IVPB 750 mg/150 mL  750 mg Intravenous Q24H Pernell Dupre, [facility]   Stopped at 12/27/19 2153   Past Medical History:  Diagnosis Date  . Carcinoma of skin   . Cauda equina compression ([place])   . Diabetic retinopathy ([place])   . GERD (gastroesophageal reflux disease)   . Hypertension    Past Surgical History:  Procedure Laterality Date  . BACK SURGERY  07/2012  . CARDIAC CATHETERIZATION  2005  . CATARACT EXTRACTION  2014   right eye   . CORONARY ARTERY BYPASS GRAFT  2005    CABG x 7 at [facility]     left knee   Social History Social History   Tobacco Use  . Smoking status: Former Smoker    Years: 45.00    Types: Pipe    Quit date: 2016    Years since quitting: 5.5  . Smokeless tobacco: Former Network engineer Use Topics  . Alcohol use: Yes    Alcohol/week: 0.0 standard drinks    Comment: occasional.  . Drug use: No   Family History Family History  Problem Relation Age of Onset  . Hypertension Sister   . Hyperlipidemia Sister   Denies family history of peripheral artery disease, venous disease or renal disease  Allergies  Allergen Reactions  . Fluorescein     Other reaction(s): Nausea And Vomiting  . Ivp Dye [Iodinated Diagnostic Agents] Other (See Comments)    Reaction:  Unknown   . Morphine Nausea Only  . Other     Pain meds by mouth FA dye Unknown reaction  . Codeine Nausea And Vomiting and Rash   REVIEW OF SYSTEMS (Negative unless checked)  Constitutional: [] Weight loss  [] Fever   [] Chills Cardiac: [] Chest pain   [] Chest pressure   [] Palpitations   [] Shortness of breath when  laying flat   [] Shortness of breath at rest   [] Shortness of breath with exertion. Vascular:  [x] Pain in legs with walking   [x] Pain in legs at rest   [x] Pain in legs when laying flat   [] Claudication   [x] Pain in feet when walking  [x] Pain in feet at rest  [x] Pain in feet when laying flat   [] History of DVT   [] Phlebitis   [] Swelling in legs   [] Varicose veins   [] Non-healing ulcers Pulmonary:   [] Uses home oxygen   [] Productive cough   [] Hemoptysis   [] Wheeze  [] COPD   [] Asthma Neurologic:  [] Dizziness  [] Blackouts   [] Seizures   [] History of stroke   [] History of TIA  [] Aphasia   [] Temporary blindness   [] Dysphagia   [] Weakness or numbness in arms   [] Weakness or numbness in legs Musculoskeletal:  [] Arthritis   [] Joint swelling   [] Joint pain   [] Low back pain Hematologic:  [] Easy bruising  [] Easy bleeding   [] Hypercoagulable state   [] Anemic  [] Hepatitis Gastrointestinal:  [] Blood in stool   [] Vomiting blood  [] Gastroesophageal reflux/heartburn   [] Difficulty swallowing. Genitourinary:  [] Chronic kidney disease   [] Difficult urination  [] Frequent urination  [] Burning with urination   [] Blood in urine Skin:  [] Rashes   [] Ulcers   [] Wounds Psychological:  [] History of anxiety   []  History of major depression.  Physical Examination  Vitals:   12/27/19 1737 12/27/19 1815 12/27/19 2255 12/28/19 0737  BP: (!) 149/61  131/64 (!) 143/76  Pulse: 90  78 93  Resp:   20 18  Temp: 98.3 F (36.8 C)  98.2 F (36.8 C) 98.6 F (37 C)  TempSrc: Oral  Oral Oral  SpO2: 100%  97% 100%  Weight:      Height:  6\' 2"  (1.88 m)     Body mass index is 21.44 kg/m. Gen:  WD/WN, NAD Head: [place]/AT, No temporalis wasting. Prominent temp pulse not noted. Ear/Nose/Throat: Hearing grossly intact, nares w/o erythema or drainage, oropharynx w/o Erythema/Exudate Eyes: Sclera non-icteric, conjunctiva clear Neck: Trachea  midline.  No JVD.  Pulmonary:  Good air movement, respirations not labored, equal bilaterally.  Cardiac: RRR, normal S1, S2. Vascular:  Vessel Right Left  Radial Palpable Palpable  Ulnar Palpable Palpable  Brachial Palpable Palpable  Carotid Palpable, without bruit Palpable, without bruit  Aorta Not palpable N/A  Femoral Palpable Palpable  Popliteal Palpable Palpable  PT Palpable Non-Palpable  DP Palpable Non-Palpable   Left lower extremity: Thigh soft.  Calf soft extremities warm distally toes.  Full-thickness ulceration to the left foot with gangrenous changes and foul-smelling odor.  Unable to palpate pedal pulses.  Gastrointestinal: soft, non-tender/non-distended. No guarding/reflex.  Musculoskeletal: M/S 5/5 throughout.  Extremities without ischemic changes.  No deformity or atrophy. No edema. Neurologic: Sensation grossly intact in extremities.  Symmetrical.  Speech is fluent. Motor exam as listed above. Psychiatric: Judgment intact, Mood & affect appropriate for pt's clinical situation. Dermatologic: No rashes or ulcers noted.  No cellulitis or open wounds. Lymph : No Cervical, Axillary, or Inguinal lymphadenopathy.  CBC Lab Results  Component Value Date   WBC 15.3 (H) 12/28/2019   HGB 10.3 (L) 12/28/2019   HCT 30.1 (L) 12/28/2019   MCV 78.4 (L) 12/28/2019   PLT 261 12/28/2019   BMET    Component Value Date/Time   NA 130 (L) 12/28/2019 0518   NA 133 (L) 08/06/2015 1032   NA 130 (L) 08/15/2013 0505   K 4.2 12/28/2019 0518   K  4.7 08/15/2013 0505   CL 100 12/28/2019 0518   CL 100 08/15/2013 0505   CO2 21 (L) 12/28/2019 0518   CO2 25 08/15/2013 0505   GLUCOSE 239 (H) 12/28/2019 0518   GLUCOSE 215 (H) 08/15/2013 0505   BUN 47 (H) 12/28/2019 0518   BUN 24 08/06/2015 1032   BUN 34 (H) 08/15/2013 0505   CREATININE 2.01 (H) 12/28/2019 0518   CREATININE 1.70 (H) 08/15/2013 0505   CALCIUM 8.1 (L) 12/28/2019 0518   CALCIUM 8.7 08/15/2013 0505   GFRNONAA 30 (L)  12/28/2019 0518   GFRNONAA 39 (L) 08/15/2013 0505   GFRAA 35 (L) 12/28/2019 0518   GFRAA 45 (L) 08/15/2013 0505   Estimated Creatinine Clearance: 30.9 mL/min (A) (by C-G formula based on SCr of 2.01 mg/dL (H)).  COAG No results found for: INR, PROTIME  Radiology No results found.  Assessment/Plan The patient is an 79 year old male with multiple medical issues with progressively worsening left foot wound  1.  Atherosclerotic disease with gangrene Patient has been followed by podiatry in regard to the left foot wound.  This left foot wound has progressively worsened.  Wound is now full-thickness with gangrenous changes and foul odor.  Podiatry is planning to take the patient to the operating room in the next day or 2 for debridement.  Recommend a left lower extremity angiogram with possible intervention and attempt assess the patient's anatomy and contributing degree of peripheral artery disease.  If appropriate an attempt to revascularize leg may be at that time.  Procedure, risks and benefits were explained to the patient.  All questions answered.  The patient wished to proceed.  We will plan on this Monday with Dr. Lucky Cowboy.  2.  Diabetes On appropriate medications Encouraged good control as its slows the progression of atherosclerotic disease  3.  Hyperlipidemia On aspirin and statin for medical management Encouraged good control as its slows the progression of atherosclerotic disease  Discussed with Dr. Mayme Genta, PA-C  12/28/2019 11:43 AM  This note was created with Dragon medical transcription system.  Any error is purely unintentional

## 2019-12-28 NOTE — Plan of Care (Signed)
Ax0x4, verbal c/o pain controlled with norco q 4. Somewhat effective results. Suprapubic in patent yellow urine in bag, remains on room air, rolling Philip Eckersley x 1 assist for mobility. Carb modified diet with good appetite. Supplements of ensure and juven started today for nutritional value and wound healing. Will continue with current care plan. Will continue to monitor

## 2019-12-29 ENCOUNTER — Inpatient Hospital Stay: Payer: No Typology Code available for payment source

## 2019-12-29 DIAGNOSIS — L97523 Non-pressure chronic ulcer of other part of left foot with necrosis of muscle: Secondary | ICD-10-CM | POA: Diagnosis not present

## 2019-12-29 LAB — GLUCOSE, CAPILLARY
Glucose-Capillary: 158 mg/dL — ABNORMAL HIGH (ref 70–99)
Glucose-Capillary: 199 mg/dL — ABNORMAL HIGH (ref 70–99)
Glucose-Capillary: 213 mg/dL — ABNORMAL HIGH (ref 70–99)
Glucose-Capillary: 169 mg/dL — ABNORMAL HIGH (ref 70–99)

## 2019-12-29 LAB — CBC
HCT: 30.6 % — ABNORMAL LOW (ref 39.0–52.0)
Hemoglobin: 10.3 g/dL — ABNORMAL LOW (ref 13.0–17.0)
MCH: 26.3 pg (ref 26.0–34.0)
MCHC: 33.7 g/dL (ref 30.0–36.0)
MCV: 78.3 fL — ABNORMAL LOW (ref 80.0–100.0)
Platelets: 279 10*3/uL (ref 150–400)
RBC: 3.91 MIL/uL — ABNORMAL LOW (ref 4.22–5.81)
RDW: 15.2 % (ref 11.5–15.5)
WBC: 15.4 10*3/uL — ABNORMAL HIGH (ref 4.0–10.5)
nRBC: 0 % (ref 0.0–0.2)

## 2019-12-29 LAB — BASIC METABOLIC PANEL
Anion gap: 7 (ref 5–15)
BUN: 53 mg/dL — ABNORMAL HIGH (ref 8–23)
CO2: 24 mmol/L (ref 22–32)
Calcium: 8.3 mg/dL — ABNORMAL LOW (ref 8.9–10.3)
Chloride: 98 mmol/L (ref 98–111)
Creatinine, Ser: 2.18 mg/dL — ABNORMAL HIGH (ref 0.61–1.24)
GFR calc Af Amer: 32 mL/min — ABNORMAL LOW (ref >60)
GFR calc non Af Amer: 27 mL/min — ABNORMAL LOW (ref >60)
Glucose, Bld: 178 mg/dL — ABNORMAL HIGH (ref 70–99)
Potassium: 4.8 mmol/L (ref 3.5–5.1)
Sodium: 129 mmol/L — ABNORMAL LOW (ref 135–145)

## 2019-12-29 LAB — MAGNESIUM: Magnesium: 2.2 mg/dL (ref 1.7–2.4)

## 2019-12-29 MED ORDER — LORATADINE 10 MG PO TABS
10.0000 mg | ORAL_TABLET | Freq: Every day | ORAL | Status: DC
  Administered 2019-12-29 – 2020-01-10 (×11): 10 mg via ORAL
  Filled 2019-12-29 (×13): qty 1

## 2019-12-29 MED ORDER — PROMETHAZINE HCL 25 MG/ML IJ SOLN
12.5000 mg | Freq: Four times a day (QID) | INTRAMUSCULAR | Status: DC | PRN
  Administered 2019-12-29 – 2020-01-13 (×12): 12.5 mg via INTRAVENOUS
  Filled 2019-12-29 (×12): qty 1

## 2019-12-29 MED ORDER — PANTOPRAZOLE SODIUM 40 MG PO TBEC
40.0000 mg | DELAYED_RELEASE_TABLET | Freq: Every day | ORAL | Status: DC
  Administered 2019-12-29 – 2020-01-10 (×12): 40 mg via ORAL
  Filled 2019-12-29 (×13): qty 1

## 2019-12-29 MED ORDER — POLYETHYLENE GLYCOL 3350 17 G PO PACK
34.0000 g | PACK | ORAL | Status: AC
  Administered 2019-12-29 – 2019-12-30 (×4): 34 g via ORAL
  Filled 2019-12-29 (×5): qty 2

## 2019-12-29 MED ORDER — SENNOSIDES-DOCUSATE SODIUM 8.6-50 MG PO TABS
2.0000 | ORAL_TABLET | Freq: Once | ORAL | Status: AC
  Administered 2019-12-29: 2 via ORAL
  Filled 2019-12-29: qty 2

## 2019-12-29 MED ORDER — FLUTICASONE PROPIONATE 50 MCG/ACT NA SUSP
2.0000 | Freq: Every day | NASAL | Status: DC
  Administered 2019-12-29 – 2020-01-17 (×11): 2 via NASAL
  Filled 2019-12-29: qty 16

## 2019-12-29 NOTE — Progress Notes (Signed)
* Surgery Date in Future *   Subjective/Chief Complaint: Patient seen. Still complains of significant pain in his left foot.   Objective: Vital signs in last 24 hours: Temp:  [98.4 F (36.9 C)-98.7 F (37.1 C)] 98.7 F (37.1 C) (07/10 0857) Pulse Rate:  [93-99] 93 (07/10 0857) Resp:  [16-19] 16 (07/10 0857) BP: (133-159)/(57-75) 142/65 (07/10 0857) SpO2:  [97 %-99 %] 99 % (07/10 0857) Last BM Date: 12/25/19  Intake/Output from previous day: 07/09 0701 - 07/10 0700 In: 240.9 [IV Piggyback:240.9] Out: 1150 [Urine:1150] Intake/Output this shift: Total I/O In: 120 [P.O.:120] Out: -   Some mild drainage is noted on the bandaging on the left foot. Upon removal some mild improvement in the erythema dorsally but still significant erythema and some edema in the plantar forefoot extending medially through the arch to the rear foot. Progressive necrotic and gangrenous changes beneath the left forefoot with the ulcerative area still approximately 4 cm x 2 cm beneath the third metatarsal but progressive tissue loss laterally towards the fourth metatarsal.    Lab Results:  Recent Labs    12/28/19 0518 12/29/19 0608  WBC 15.3* 15.4*  HGB 10.3* 10.3*  HCT 30.1* 30.6*  PLT 261 279   BMET Recent Labs    12/28/19 0518 12/29/19 0608  NA 130* 129*  K 4.2 4.8  CL 100 98  CO2 21* 24  GLUCOSE 239* 178*  BUN 47* 53*  CREATININE 2.01* 2.18*  CALCIUM 8.1* 8.3*   PT/INR No results for input(s): LABPROT, INR in the last 72 hours. ABG No results for input(s): PHART, HCO3 in the last 72 hours.  Invalid input(s): PCO2, PO2  Studies/Results: No results found.  Anti-infectives: Anti-infectives (From admission, onward)   Start     Dose/Rate Route Frequency Ordered Stop   12/27/19 1900  vancomycin (VANCOCIN) IVPB 1000 mg/200 mL premix  Status:  Discontinued        1,000 mg 200 mL/hr over 60 Minutes Intravenous Every 24 hours 01/11/2020 2043 12/27/19 1217   12/27/19 1800  vancomycin  (VANCOREADY) IVPB 750 mg/150 mL     Discontinue     750 mg 150 mL/hr over 60 Minutes Intravenous Every 24 hours 12/27/19 1217     01/04/2020 2200  metroNIDAZOLE (FLAGYL) tablet 500 mg     Discontinue     500 mg Oral Every 8 hours 01/06/2020 2009     12/30/2019 2045  vancomycin (VANCOREADY) IVPB 500 mg/100 mL        500 mg 100 mL/hr over 60 Minutes Intravenous  Once 01/09/2020 2040 12/27/19 0000   01/02/2020 2015  cefTRIAXone (ROCEPHIN) 2 g in sodium chloride 0.9 % 100 mL IVPB     Discontinue     2 g 200 mL/hr over 30 Minutes Intravenous Every 24 hours 01/11/2020 2009     01/09/2020 1815  vancomycin (VANCOCIN) IVPB 1000 mg/200 mL premix        1,000 mg 200 mL/hr over 60 Minutes Intravenous  Once 01/12/2020 1801 01/04/2020 2023   01/18/2020 1815  piperacillin-tazobactam (ZOSYN) IVPB 3.375 g        3.375 g 100 mL/hr over 30 Minutes Intravenous  Once 01/14/2020 1801 01/19/2020 1953      Assessment/Plan: s/p Procedure(s): Lower Extremity Angiography (Left) Assessment: Progressive gangrenous changes with peripheral vascular disease.   Plan: Betadine wet-to-dry dressing applied to the left foot. Discussed with the patient that his foot in my opinion continues to look worse at this point. We did discuss that  even if revascularization is successful on Monday that due to the extent of his infection and tissue loss that he still is at a very high risk for possible need for higher amputation because with the progressive gangrenous changes and infection debridement alone may not suffice and he is at risk at this point and transmetatarsal amputation may be difficult to perform. Will reevaluate on Monday.  LOS: 3 days    Durward Fortes 12/29/2019

## 2019-12-29 NOTE — Consult Note (Signed)
Vascular progressNote  MRN : [id number]  [name] is a 79 y.o. (12/07/1940) male who presents with chief complaint of  Chief Complaint  Patient presents with  . Skin Ulcer   History of Present Illness:  [name] is a 79 y.o. 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  He is followed by podiatry and there is concern that even with revascularization the foot my require a more proximal amputation.      Physical Examination  Vitals:   12/28/19 1643 12/28/19 2352 12/29/19 0820 12/29/19 0857  BP: (!) 142/71 (!) 133/57 (!) 159/75 (!) 142/65  Pulse: 97 93 99 93  Resp: 18 19 18 16   Temp: 98.5 F (36.9 C) 98.4 F (36.9 C)  98.7 F (37.1 C)  TempSrc: Oral Oral  Oral  SpO2: 97% 98%  99%  Weight:      Height:       Body mass index is 21.44 kg/m. Vascular:  Vessel Right Left  Radial Palpable Palpable  Ulnar Palpable Palpable  Brachial Palpable Palpable  Carotid Palpable, without bruit Palpable, without bruit  Aorta Not palpable N/A  Femoral Palpable Palpable  Popliteal Palpable Palpable  PT Palpable Non-Palpable  DP Palpable Non-Palpable   Left lower extremity: Thigh soft.  Calf soft extremities warm distally toes.  Full-thickness ulceration to the left foot with gangrenous changes and foul-smelling odor.  Unable to palpate pedal pulses.   CBC Lab Results  Component Value Date   WBC 15.4 (H) 12/29/2019   HGB 10.3 (L) 12/29/2019   HCT 30.6 (L) 12/29/2019   MCV 78.3 (L) 12/29/2019   PLT 279 12/29/2019   BMET    Component Value Date/Time   NA 129 (L) 12/29/2019 0608   NA 133 (L) 08/06/2015 1032   NA 130 (L) 08/15/2013 0505   K 4.8 12/29/2019 0608   K 4.7  08/15/2013 0505   CL 98 12/29/2019 0608   CL 100 08/15/2013 0505   CO2 24 12/29/2019 0608   CO2 25 08/15/2013 0505   GLUCOSE 178 (H) 12/29/2019 0608   GLUCOSE 215 (H) 08/15/2013 0505   BUN 53 (H) 12/29/2019 0608   BUN 24 08/06/2015 1032   BUN 34 (H) 08/15/2013 0505   CREATININE 2.18 (H) 12/29/2019 0608   CREATININE 1.70 (H) 08/15/2013 0505   CALCIUM 8.3 (L) 12/29/2019 0608   CALCIUM 8.7 08/15/2013 0505   GFRNONAA 27 (L) 12/29/2019 0608   GFRNONAA 39 (L) 08/15/2013 0505   GFRAA 32 (L) 12/29/2019 0608   GFRAA 45 (L) 08/15/2013 0505   Estimated Creatinine Clearance: 28.5 mL/min (A) (by C-G formula based on SCr of 2.18 mg/dL (H)).  COAG No results found for: INR, PROTIME  Radiology No results found.  Assessment/Plan The patient is an 79 year old male with multiple medical issues with progressively worsening left foot wound  1.  Atherosclerotic disease with gangrene Patient has been followed by podiatry in regard to the left foot wound.  This left foot wound has progressively worsened.  Angiography and revascularization is recommended for limb salvage. We will plan on this Monday with Dr. Lucky Cowboy.    Richardson Dopp, MD  12/29/2019 10:49 AM  This note was created with Dragon medical transcription system.  Any  error is purely unintentional

## 2019-12-29 NOTE — Plan of Care (Signed)
[name], verbal c/o nausea zofran given and gingerale vomit x 1 this AM. MD began protonix this AM. Pain controlled with norco q 4. Somewhat effective results. Suprapubic in patent yellow urine in bag, remains on room air, rolling [name] x 1 assist for mobility. Carb modified diet with good appetite. Will continue with current care plan. Will continue to monitor

## 2019-12-29 NOTE — Progress Notes (Addendum)
PROGRESS NOTE    [name]  RWE:[id number] DOB: Feb 27, 1941 DOA: 01/04/2020 PCP: Olin Hauser, DO   Chief Complaint: Left foot ulcer infection  Assessment & Plan:   Principal Problem:   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 ([place]) Active Problems:   Coronary artery disease   Hypertension associated with diabetes ([place])   Urinary retention   Type 2 diabetes mellitus with foot ulcer ([place])   CKD (chronic kidney disease), stage III   Cellulitis of left foot   Hyperlipidemia associated with type 2 diabetes mellitus ([place])    [name] is a 79 y.o. 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   Diabetic ulcer of the left foot with associated cellulitis PVD Follows with podiatry, Dr. Cleda Mccreedy.  Presenting with associated infection, lactic acidosis, and leukocytosis.  Outpatient x-ray reports soft tissue defect beneath the third metatarsal with possible early gas. -started on empiric antibiotics with IV vancomycin and ceftriaxone, oral metronidazole --podiatry consulted PLAN: --Need vascular surgery eval and intervention before surgical debridement of foot, planned for Monday --continue empiric abx IV vancomycin and ceftriaxone, oral metronidazole --Pain control with oral pain meds  N/V and constipation 2/2 opioids use --Pt started complaining of N/V today, and hasn't had BM since presentation.  Pt has been requesting Norco x2 q4h PLAN: --High-dose Miralax --Senna x2 tablets today --KUB (came back unremarkable) --IV zofran and phenergan PRN  Insulin-dependent type 2 diabetes with hyperglycemia: Reports taking Lantus 18 units daily at home.  Has not had  consistent PCP follow-up. PLAN: -increase Lantus to 25 units daily,  --moderate SSI TID --continue meal-time insulin 7u TID  CKD stage III: Creatinine 1.95 with EGFR 31.  No recent baseline, prior labs August 2019 showed creatinine 2.24 with EGFR 26.  CAD s/p CABG: Chronic and stable, denies any chest pain.  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  Microcytic anemia: Hemoglobin stable at 12.1 without obvious bleeding.  Will obtain anemia panel.  Hypertension: Currently stable.  Continue home amlodipine, hold losartan for now.  Hyperlipidemia: Continue simvastatin.  BPH/urinary retention with chronic suprapubic catheter: Chronic and stable, continue care.   DVT prophylaxis: Heparin SQ Code Status: Full code  Family Communication:  Status is: inpatient Dispo:   The patient is from: home Anticipated d/c is to: home Anticipated d/c date is: >4 days Patient currently is not medically stable to d/c due to: severe pain of diabetic foot ulcer needing surgical debridement.  vascular surgery eval will have to wait until Monday.   Subjective and Interval History:  Pt had N/V all day.  Reported no BM since presentation.  Aggressive bowel regimen started. Obtained KUB which is unremarkable.   Objective: Vitals:   12/28/19 2352 12/29/19 0820 12/29/19 0857 12/29/19 2315  BP: (!) 133/57 (!) 159/75 (!) 142/65 (!) 145/65  Pulse: 93 99 93 91  Resp: '19 18 16 18  '$ Temp: 98.4 F (36.9 C)  98.7 F (37.1 C) 99.5 F (37.5 C)  TempSrc: Oral  Oral Oral  SpO2: 98%  99% 98%  Weight:      Height:        Intake/Output Summary (Last 24 hours) at 12/30/2019 0239 Last data filed at  12/29/2019 2247 Gross per 24 hour  Intake 120 ml  Output 1975 ml  Net -1855 ml   Filed Weights   01/02/2020 1533  Weight: 75.8 kg    Examination:   Constitutional: NAD, oriented, sleepy HEENT: conjunctivae and lids normal, EOMI CV: RRR no M,R,G. Distal pulses +2.   No cyanosis.   RESP: CTA B/L, normal respiratory effort  GI: +BS, NTND Extremities: swelling and erythema in left foot and half way up left lower leg.  Left foot wrapped. Neuro: II - XII grossly intact.  Sensation intact   Data Reviewed: I have personally reviewed following labs and imaging studies  CBC: Recent Labs  Lab 12/29/2019 1545 12/27/19 0646 12/28/19 0518 12/29/19 0608  WBC 22.4* 16.7* 15.3* 15.4*  NEUTROABS 20.0*  --   --   --   HGB 12.1* 10.7* 10.3* 10.3*  HCT 36.3* 31.8* 30.1* 30.6*  MCV 78.6* 78.5* 78.4* 78.3*  PLT 316 258 261 671   Basic Metabolic Panel: Recent Labs  Lab 01/08/2020 1545 12/27/19 0646 12/28/19 0518 12/29/19 0608  NA 131* 129* 130* 129*  K 5.0 4.7 4.2 4.8  CL 98 99 100 98  CO2 21* 19* 21* 24  GLUCOSE 362* 277* 239* 178*  BUN 40* 43* 47* 53*  CREATININE 1.95* 2.12* 2.01* 2.18*  CALCIUM 8.6* 8.1* 8.1* 8.3*  MG  --   --  1.3* 2.2   GFR: Estimated Creatinine Clearance: 28.5 mL/min (A) (by C-G formula based on SCr of 2.18 mg/dL (H)). Liver Function Tests: Recent Labs  Lab 01/07/2020 1545  AST 28  ALT 27  ALKPHOS 102  BILITOT 0.7  PROT 7.6  ALBUMIN 3.2*   No results for input(s): LIPASE, AMYLASE in the last 168 hours. No results for input(s): AMMONIA in the last 168 hours. Coagulation Profile: No results for input(s): INR, PROTIME in the last 168 hours. Cardiac Enzymes: No results for input(s): CKTOTAL, CKMB, CKMBINDEX, TROPONINI in the last 168 hours. BNP (last 3 results) No results for input(s): PROBNP in the last 8760 hours. HbA1C: No results for input(s): HGBA1C in the last 72 hours. CBG: Recent Labs  Lab 12/28/19 2050 12/29/19 0900 12/29/19 1207 12/29/19 1652 12/29/19 2119  GLUCAP 108* 158* 199* 213* 169*   Lipid Profile: No results for input(s): CHOL, HDL, LDLCALC, TRIG, CHOLHDL, LDLDIRECT in the last 72 hours. Thyroid Function Tests: No results for input(s): TSH, T4TOTAL, FREET4, T3FREE, THYROIDAB in the last 72  hours. Anemia Panel: No results for input(s): VITAMINB12, FOLATE, FERRITIN, TIBC, IRON, RETICCTPCT in the last 72 hours. Sepsis Labs: Recent Labs  Lab 01/06/2020 1545 12/29/2019 1833  LATICACIDVEN 2.9* 1.5    Recent Results (from the past 240 hour(s))  Blood Cultures x 2 sites     Status: None (Preliminary result)   Collection Time: 01/16/2020  8:48 PM   Specimen: BLOOD  Result Value Ref Range Status   Specimen Description BLOOD BLOOD LEFT FOREARM  Final   Special Requests   Final    BOTTLES DRAWN AEROBIC AND ANAEROBIC Blood Culture adequate volume   Culture   Final    NO GROWTH 3 DAYS Performed at [facility], [address], [place], [place] [address]    Report Status PENDING  Incomplete  Culture, blood (Routine X 2) w Reflex to ID Panel     Status: None (Preliminary result)   Collection Time: 12/27/19  7:01 PM   Specimen: BLOOD  Result Value Ref Range Status   Specimen Description BLOOD BLOOD RIGHT  HAND  Final   Special Requests   Final    BOTTLES DRAWN AEROBIC AND ANAEROBIC Blood Culture adequate volume   Culture   Final    NO GROWTH 2 DAYS Performed at [facility], [address] [place], [place] [address]    Report Status PENDING  Incomplete  SARS Coronavirus 2 by RT PCR (hospital order, performed in [facility] hospital lab) Nasopharyngeal Nasopharyngeal Swab     Status: None   Collection Time: 12/27/19  8:45 PM   Specimen: Nasopharyngeal Swab  Result Value Ref Range Status   SARS Coronavirus 2 NEGATIVE NEGATIVE Final    Comment: (NOTE) SARS-CoV-2 target nucleic acids are NOT DETECTED.  The SARS-CoV-2 RNA is generally detectable in upper and lower respiratory specimens during the acute phase of infection. The lowest concentration of SARS-CoV-2 viral copies this assay can detect is 250 copies / mL. A negative result does not preclude SARS-CoV-2 infection and should not be used as the sole basis for treatment or other patient management  decisions.  A negative result may occur with improper specimen collection / handling, submission of specimen other than nasopharyngeal swab, presence of viral mutation(s) within the areas targeted by this assay, and inadequate number of viral copies (<250 copies / mL). A negative result must be combined with clinical observations, patient history, and epidemiological information.  Fact Sheet for Patients:   StrictlyIdeas.no  Fact Sheet for Healthcare Providers: BankingDealers.co.za  This test is not yet approved or  cleared by the [place] FDA and has been authorized for detection and/or diagnosis of SARS-CoV-2 by FDA under an Emergency Use Authorization (EUA).  This EUA will remain in effect (meaning this test can be used) for the duration of the COVID-19 declaration under Section 564(b)(1) of the Act, 21 U.S.C. section 360bbb-3(b)(1), unless the authorization is terminated or revoked sooner.  Performed at [facility], [address] [place], [place] [address]   MRSA PCR Screening     Status: None   Collection Time: 12/27/19  8:45 PM   Specimen: Nasopharyngeal  Result Value Ref Range Status   MRSA by PCR NEGATIVE NEGATIVE Final    Comment:        The GeneXpert MRSA Assay (FDA approved for NASAL specimens only), is one component of a comprehensive MRSA colonization surveillance program. It is not intended to diagnose MRSA infection nor to guide or monitor treatment for MRSA infections. Performed at [facility], [address] [place],  [address]       Radiology Studies: DG Abd 1 View  Result Date: 12/29/2019 CLINICAL DATA:  Nausea and vomiting EXAM: ABDOMEN - 1 VIEW COMPARISON:  07/31/2015 FINDINGS: Two supine frontal views of the abdomen and pelvis demonstrate an unremarkable bowel gas pattern. No masses or abnormal calcifications. Lung bases are clear. No acute bony abnormalities.  IMPRESSION: 1. Unremarkable bowel gas pattern. Electronically Signed   By: Randa Ngo M.D.   On: 12/29/2019 19:31     Scheduled Meds: . amLODipine  10 mg Oral Daily  . aspirin EC  81 mg Oral Daily  . Chlorhexidine Gluconate Cloth  6 each Topical Daily  . fluticasone  2 spray Each Nare Daily  . heparin  5,000 Units Subcutaneous Q8H  . insulin aspart  0-15 Units Subcutaneous TID WC  . insulin aspart  7 Units Subcutaneous TID WC  . insulin glargine  20 Units Subcutaneous Daily  . loratadine  10 mg Oral Daily  . metroNIDAZOLE  500 mg  Oral Q8H  . multivitamin with minerals  1 tablet Oral Daily  . nutrition supplement (JUVEN)  1 packet Oral BID BM  . pantoprazole  40 mg Oral Daily  . polyethylene glycol  34 g Oral Q2H  . Ensure Max Protein  11 oz Oral Daily  . simvastatin  10 mg Oral QHS  . sodium chloride flush  3 mL Intravenous Once  . tamsulosin  0.4 mg Oral Daily   Continuous Infusions: . sodium chloride 1,000 mL (12/29/19 2050)  . cefTRIAXone (ROCEPHIN)  IV 2 g (12/29/19 2052)  . vancomycin 750 mg (12/29/19 1714)     LOS: 4 days     Enzo Bi, MD [facility] If 7PM-7AM, please contact night-coverage 12/30/2019, 2:39 AM

## 2019-12-30 DIAGNOSIS — L97523 Non-pressure chronic ulcer of other part of left foot with necrosis of muscle: Secondary | ICD-10-CM | POA: Diagnosis not present

## 2019-12-30 LAB — BASIC METABOLIC PANEL
Anion gap: 9 (ref 5–15)
BUN: 55 mg/dL — ABNORMAL HIGH (ref 8–23)
CO2: 22 mmol/L (ref 22–32)
Calcium: 8.5 mg/dL — ABNORMAL LOW (ref 8.9–10.3)
Chloride: 99 mmol/L (ref 98–111)
Creatinine, Ser: 2.12 mg/dL — ABNORMAL HIGH (ref 0.61–1.24)
GFR calc Af Amer: 33 mL/min — ABNORMAL LOW (ref >60)
GFR calc non Af Amer: 28 mL/min — ABNORMAL LOW (ref >60)
Glucose, Bld: 158 mg/dL — ABNORMAL HIGH (ref 70–99)
Potassium: 4.9 mmol/L (ref 3.5–5.1)
Sodium: 130 mmol/L — ABNORMAL LOW (ref 135–145)

## 2019-12-30 LAB — GLUCOSE, CAPILLARY
Glucose-Capillary: 153 mg/dL — ABNORMAL HIGH (ref 70–99)
Glucose-Capillary: 114 mg/dL — ABNORMAL HIGH (ref 70–99)
Glucose-Capillary: 150 mg/dL — ABNORMAL HIGH (ref 70–99)
Glucose-Capillary: 97 mg/dL (ref 70–99)

## 2019-12-30 LAB — CBC
HCT: 32.1 % — ABNORMAL LOW (ref 39.0–52.0)
Hemoglobin: 10.6 g/dL — ABNORMAL LOW (ref 13.0–17.0)
MCH: 26 pg (ref 26.0–34.0)
MCHC: 33 g/dL (ref 30.0–36.0)
MCV: 78.7 fL — ABNORMAL LOW (ref 80.0–100.0)
Platelets: 316 10*3/uL (ref 150–400)
RBC: 4.08 MIL/uL — ABNORMAL LOW (ref 4.22–5.81)
RDW: 14.9 % (ref 11.5–15.5)
WBC: 12 10*3/uL — ABNORMAL HIGH (ref 4.0–10.5)
nRBC: 0 % (ref 0.0–0.2)

## 2019-12-30 LAB — MAGNESIUM: Magnesium: 2 mg/dL (ref 1.7–2.4)

## 2019-12-30 MED ORDER — HYDROMORPHONE HCL 1 MG/ML IJ SOLN
0.5000 mg | Freq: Once | INTRAMUSCULAR | Status: AC
  Administered 2019-12-30: 0.5 mg via INTRAVENOUS
  Filled 2019-12-30: qty 1

## 2019-12-30 MED ORDER — METRONIDAZOLE IN NACL 5-0.79 MG/ML-% IV SOLN
500.0000 mg | Freq: Three times a day (TID) | INTRAVENOUS | Status: DC
  Administered 2019-12-30 – 2020-01-04 (×14): 500 mg via INTRAVENOUS
  Filled 2019-12-30 (×18): qty 100

## 2019-12-30 MED ORDER — INSULIN GLARGINE 100 UNIT/ML ~~LOC~~ SOLN: 22.0000 [IU] | Freq: Every day | SUBCUTANEOUS | Status: DC

## 2019-12-30 MED ORDER — INSULIN GLARGINE 100 UNIT/ML ~~LOC~~ SOLN
25.0000 [IU] | Freq: Every day | SUBCUTANEOUS | Status: DC
  Administered 2019-12-30 – 2020-01-01 (×2): 25 [IU] via SUBCUTANEOUS
  Filled 2019-12-30 (×3): qty 0.25

## 2019-12-30 MED ORDER — SENNOSIDES-DOCUSATE SODIUM 8.6-50 MG PO TABS
2.0000 | ORAL_TABLET | Freq: Two times a day (BID) | ORAL | Status: DC
  Administered 2019-12-30 – 2020-01-05 (×9): 2 via ORAL
  Filled 2019-12-30 (×11): qty 2

## 2019-12-30 NOTE — Consult Note (Signed)
Vascular progressNote  MRN : [id number]  [name] is a 79 y.o. (Dec 12, 1940) male who presents with chief complaint of  Chief Complaint  Patient presents with   Skin Ulcer   History of Present Illness:  [name] is a 79 y.o. 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  He is followed by podiatry and there is concern that even with revascularization the foot my require a more proximal amputation.      Physical Examination  Vitals:   12/29/19 0820 12/29/19 0857 12/29/19 2315 12/30/19 0900  BP: (!) 159/75 (!) 142/65 (!) 145/65 (!) 134/57  Pulse: 99 93 91 86  Resp: 18 16 18 16   Temp:  98.7 F (37.1 C) 99.5 F (37.5 C) 98.2 F (36.8 C)  TempSrc:  Oral Oral Oral  SpO2:  99% 98% 99%  Weight:      Height:       Body mass index is 21.44 kg/m. Vascular:  Vessel Right Left  Radial Palpable Palpable  Ulnar Palpable Palpable  Brachial Palpable Palpable  Carotid Palpable, without bruit Palpable, without bruit  Aorta Not palpable N/A  Femoral Palpable Palpable  Popliteal Palpable Palpable  PT Palpable Non-Palpable  DP Palpable Non-Palpable   Left lower extremity: Thigh soft.  Calf soft extremities warm distally toes.  Full-thickness ulceration to the left foot with gangrenous changes and foul-smelling odor.  Unable to palpate pedal pulses.   CBC Lab Results  Component Value Date   WBC 12.0 (H) 12/30/2019   HGB 10.6 (L) 12/30/2019   HCT 32.1 (L) 12/30/2019   MCV 78.7 (L) 12/30/2019   PLT 316 12/30/2019   BMET    Component Value Date/Time   NA 130 (L) 12/30/2019 0432   NA 133 (L) 08/06/2015 1032   NA 130 (L) 08/15/2013 0505   K 4.9 12/30/2019 0432   K 4.7  08/15/2013 0505   CL 99 12/30/2019 0432   CL 100 08/15/2013 0505   CO2 22 12/30/2019 0432   CO2 25 08/15/2013 0505   GLUCOSE 158 (H) 12/30/2019 0432   GLUCOSE 215 (H) 08/15/2013 0505   BUN 55 (H) 12/30/2019 0432   BUN 24 08/06/2015 1032   BUN 34 (H) 08/15/2013 0505   CREATININE 2.12 (H) 12/30/2019 0432   CREATININE 1.70 (H) 08/15/2013 0505   CALCIUM 8.5 (L) 12/30/2019 0432   CALCIUM 8.7 08/15/2013 0505   GFRNONAA 28 (L) 12/30/2019 0432   GFRNONAA 39 (L) 08/15/2013 0505   GFRAA 33 (L) 12/30/2019 0432   GFRAA 45 (L) 08/15/2013 0505   Estimated Creatinine Clearance: 29.3 mL/min (A) (by C-G formula based on SCr of 2.12 mg/dL (H)).  COAG No results found for: INR, PROTIME  Radiology DG Abd 1 View  Result Date: 12/29/2019 CLINICAL DATA:  Nausea and vomiting EXAM: ABDOMEN - 1 VIEW COMPARISON:  07/31/2015 FINDINGS: Two supine frontal views of the abdomen and pelvis demonstrate an unremarkable bowel gas pattern. No masses or abnormal calcifications. Lung bases are clear. No acute bony abnormalities. IMPRESSION: 1. Unremarkable bowel gas pattern. Electronically Signed   By: Randa Ngo M.D.   On: 12/29/2019 19:31    Assessment/Plan The patient is an 79 year old male with multiple medical issues with progressively worsening left  foot wound  1.  Atherosclerotic disease with gangrene Patient has been followed by podiatry in regard to the left foot wound.  This left foot wound has progressively worsened.  Angiography and revascularization is recommended for limb salvage. We will plan on this Monday with Dr. Lucky Cowboy.\ I have made him NPO    Richardson Dopp, MD  12/30/2019 10:30 AM  This note was created with Dragon medical transcription system.  Any error is purely unintentional

## 2019-12-30 NOTE — Progress Notes (Signed)
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    Consultants:  Podiatry Vascular  Procedures:   Antimicrobials:   Metronidazole and ceftriaxone, vancomycin   Subjective: He feels better.  He feels that the pain is coming back he did receive pain meds earlier.  No vomiting nausea.  Objective: Vitals:   12/28/19 2352 12/29/19 0820 12/29/19 0857 12/29/19 2315  BP: (!) 133/57 (!) 159/75 (!) 142/65 (!) 145/65  Pulse: 93 99 93 91  Resp: _0 Temp: 98.4 F (36.9 C)  98.7 F (37.1 C) 99.5 F (37.5 C)  TempSrc: Oral  Oral Oral  SpO2: 98%  99% 98%  Weight:      Height:        Intake/Output Summary (Last 24 hours) at 12/30/2019 0823 Last data filed at 12/30/2019 0311 Gross per 24 hour  Intake 120 ml  Output 2025 ml  Net -1905 ml   Filed Weights   12/27/2019 1533  Weight: 75.8 kg    Examination:  General exam: Appears calm and comfortable  Respiratory system: Clear to auscultation. Respiratory effort normal. Cardiovascular system: S1 & S2 heard, RRR. No JVD, murmurs, rubs, gallops or clicks.  Gastrointestinal system: Abdomen is nondistended, soft and nontender. Normal bowel sounds heard. Central nervous system: Alert and oriented.  Grossly intact Extremities: Left lower extremity wrapped in dressing, no edema bilaterally Skin: Warm dry Psychiatry: Judgement and insight appear normal. Mood & affect appropriate.     Data Reviewed: I have personally reviewed following labs and imaging  studies  CBC: Recent Labs  Lab 12/23/2019 1545 12/27/19 0646 12/28/19 0518 12/29/19 0608 12/30/19 0432  WBC 22.4* 16.7* 15.3* 15.4* 12.0*  NEUTROABS 20.0*  --   --   --   --   HGB 12.1* 10.7* 10.3* 10.3* 10.6*  HCT 36.3* 31.8* 30.1* 30.6* 32.1*  MCV 78.6* 78.5* 78.4* 78.3* 78.7*  PLT 316 258 261 279 671   Basic Metabolic Panel: Recent Labs  Lab 01/09/2020 1545 12/27/19 0646 12/28/19 0518 12/29/19 0608 12/30/19 0432  NA 131* 129* 130* 129* 130*  K 5.0 4.7 4.2 4.8 4.9  CL 98 99 100 98 99  CO2 21* 19* 21* 24 22  GLUCOSE 362* 277* 239* 178* 158*  BUN 40* 43* 47* 53* 55*  CREATININE 1.95* 2.12* 2.01* 2.18* 2.12*  CALCIUM 8.6* 8.1* 8.1* 8.3* 8.5*  MG  --   --  1.3* 2.2 2.0   GFR: Estimated Creatinine Clearance: 29.3 mL/min (A) (by C-G formula based on SCr of 2.12 mg/dL (H)). Liver Function Tests: Recent Labs  Lab 01/08/2020 1545  AST 28  ALT 27  ALKPHOS 102  BILITOT 0.7  PROT 7.6  ALBUMIN 3.2*   No results for input(s): LIPASE, AMYLASE in the last 168 hours. No results for input(s): AMMONIA in the last 168 hours. Coagulation Profile: No results for input(s): INR, PROTIME in the last 168 hours. Cardiac Enzymes: No results for input(s): CKTOTAL, CKMB, CKMBINDEX, TROPONINI in the last 168 hours. BNP (last 3 results) No results for input(s): PROBNP in the last 8760 hours. HbA1C: No results for input(s): HGBA1C  in the last 72 hours. CBG: Recent Labs  Lab 12/28/19 2050 12/29/19 0900 12/29/19 1207 12/29/19 1652 12/29/19 2119  GLUCAP 108* 158* 199* 213* 169*   Lipid Profile: No results for input(s): CHOL, HDL, LDLCALC, TRIG, CHOLHDL, LDLDIRECT in the last 72 hours. Thyroid Function Tests: No results for input(s): TSH, T4TOTAL, FREET4, T3FREE, THYROIDAB in the last 72 hours. Anemia Panel: No results for input(s): VITAMINB12, FOLATE, FERRITIN, TIBC, IRON, RETICCTPCT in the last 72 hours. Sepsis Labs: Recent Labs  Lab 01/10/2020 1545 01/12/2020 1833  LATICACIDVEN  2.9* 1.5    Recent Results (from the past 240 hour(s))  Blood Cultures x 2 sites     Status: None (Preliminary result)   Collection Time: 01/16/2020  8:48 PM   Specimen: BLOOD  Result Value [place] Status   Specimen Description BLOOD BLOOD LEFT FOREARM  Final   Special Requests   Final    BOTTLES DRAWN AEROBIC AND ANAEROBIC Blood Culture adequate volume   Culture   Final    NO GROWTH 4 DAYS Performed at [facility], [address] [place], [place] [address]    Report Status PENDING  Incomplete  Culture, blood (Routine X 2) w Reflex to ID Panel     Status: None (Preliminary result)   Collection Time: 12/27/19  7:01 PM   Specimen: BLOOD  Result Value [place] Status   Specimen Description BLOOD BLOOD RIGHT HAND  Final   Special Requests   Final    BOTTLES DRAWN AEROBIC AND ANAEROBIC Blood Culture adequate volume   Culture   Final    NO GROWTH 3 DAYS Performed at [facility], [address], [place], [place] [address]    Report Status PENDING  Incomplete  SARS Coronavirus 2 by RT PCR (hospital order, performed in [place] hospital lab) Nasopharyngeal Nasopharyngeal Swab     Status: None   Collection Time: 12/27/19  8:45 PM   Specimen: Nasopharyngeal Swab  Result Value Ref [place] Status   SARS Coronavirus 2 NEGATIVE NEGATIVE Final    Comment: (NOTE) SARS-CoV-2 target nucleic acids are NOT DETECTED.  The SARS-CoV-2 RNA is generally detectable in upper and lower respiratory specimens during the acute phase of infection. The lowest concentration of SARS-CoV-2 viral copies this assay can detect is 250 copies / mL. A negative result does not preclude SARS-CoV-2 infection and should not be used as the sole basis for treatment or other patient management decisions.  A negative result may occur with improper specimen collection / handling, submission of specimen other than nasopharyngeal swab, presence of viral mutation(s) within the areas targeted by this  assay, and inadequate number of viral copies (<250 copies / mL). A negative result must be combined with clinical observations, patient history, and epidemiological information.  Fact Sheet for Patients:   StrictlyIdeas.no  Fact Sheet for Healthcare Providers: BankingDealers.co.za  This test is not yet approved or  cleared by the [place] FDA and has been authorized for detection and/or diagnosis of SARS-CoV-2 by FDA under an Emergency Use Authorization (EUA).  This EUA will remain in effect (meaning this test can be used) for the duration of the COVID-19 declaration under Section 564(b)(1) of the Act, 21 U.S.C. section 360bbb-3(b)(1), unless the authorization is terminated or revoked sooner.  Performed at [facility], [address], [place], [place] [address]   MRSA PCR Screening     Status: None   Collection Time: 12/27/19  8:45 PM   Specimen: Nasopharyngeal  Result Value  Ref [place] Status   MRSA by PCR NEGATIVE NEGATIVE Final    Comment:        The GeneXpert MRSA Assay (FDA approved for NASAL specimens only), is one component of a comprehensive MRSA colonization surveillance program. It is not intended to diagnose MRSA infection nor to guide or monitor treatment for MRSA infections. Performed at [facility], [address] [place], [place] [address]          Radiology Studies: DG Abd 1 View  Result Date: 12/29/2019 CLINICAL DATA:  Nausea and vomiting EXAM: ABDOMEN - 1 VIEW COMPARISON:  07/31/2015 FINDINGS: Two supine frontal views of the abdomen and pelvis demonstrate an unremarkable bowel gas pattern. No masses or abnormal calcifications. Lung bases are clear. No acute bony abnormalities. IMPRESSION: 1. Unremarkable bowel gas pattern. Electronically Signed   By: Randa Ngo M.D.   On: 12/29/2019 19:31        Scheduled Meds: . amLODipine  10 mg Oral Daily  . aspirin EC  81 mg Oral  Daily  . Chlorhexidine Gluconate Cloth  6 each Topical Daily  . fluticasone  2 spray Each Nare Daily  . heparin  5,000 Units Subcutaneous Q8H  . insulin aspart  0-15 Units Subcutaneous TID WC  . insulin aspart  7 Units Subcutaneous TID WC  . insulin glargine  25 Units Subcutaneous Daily  . loratadine  10 mg Oral Daily  . multivitamin with minerals  1 tablet Oral Daily  . nutrition supplement (JUVEN)  1 packet Oral BID BM  . pantoprazole  40 mg Oral Daily  . Ensure Max Protein  11 oz Oral Daily  . senna-docusate  2 tablet Oral BID  . simvastatin  10 mg Oral QHS  . sodium chloride flush  3 mL Intravenous Once  . tamsulosin  0.4 mg Oral Daily   Continuous Infusions: . sodium chloride 1,000 mL (12/29/19 2050)  . cefTRIAXone (ROCEPHIN)  IV 2 g (12/29/19 2052)  . metronidazole    . 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 Outpatient x-ray reportssoft tissue defect beneath the third metatarsal with possible early gas. -started on empiric antibiotics with IV vancomycin and ceftriaxone, oral metronidazole Podiatry and vascular consulted 7/11-WBC decreasing Per podiatry patient is at high risk of foot amputation due to progressive gangrenous changes and infection, debridement alone may not suffice, will reevaluate on Monday Plan: Angiography and revascularization is recommended for limb salvage.  Plan on this Monday with Dr.  Noel Christmas: -Continue vancomycin, ceftriaxone.  Change oral metronidazole to IV -Pain control Will apply Betadine wet-to-dry dressing applied to the left foot   N/V and constipation 2/2 opioids use --Pt started complaining of N/V today, and hasn't had BM since presentation.  Pt has been requesting Norco x2 q4h 7/11 patient reports this a.m. resolved PLAN:  -Continue bowel regimen with MiraLAX and senna Antiemetics as needed   Insulin-dependent type 2 diabeteswith hyperglycemia: Reports taking Lantus 18 units daily at  home. Has not had consistent PCP follow-up. PLAN: Continue Lantus 25 mg daily R ISS Continue mealtime insulin 7 units 3 times daily Check fingersticks  CKD stage III: Creatinine 1.95 with EGFR 31. No recent baseline, prior labs August 2019 showed creatinine 2.24 with EGFR 26. At baseline Continue to monitor  CAD s/p CABG: Chronic and stable, denies any chest pain.  First-degree AV block with bradycardias/p PPM: EKG reveals paced rhythm  Microcytic anemia: Hemoglobin stable at 12.1 without obvious bleeding.  H&H currently stable  We will monitor for bleed   Hypertension: Currently stable. Continue home amlodipine, hold losartan for now.  Hyperlipidemia: Continue statin  BPH/urinary retentionwith chronic suprapubic catheter: Chronic and stable, continue care.   DVT prophylaxis: Heparin SQ Code Status: Full code  Family Communication:  Disposition Plan:  Status is: Inpatient  Remains inpatient appropriate because:Ongoing diagnostic testing needed not appropriate for outpatient work up   Dispo: The patient is from: Home              Anticipated d/c is to: home vs rehab, will need evaluation at time of dc              Anticipated d/c date is: 3 days              Patient currently is not medically stable to d/c.            LOS: 4 days   Time spent: 45 min with >50% on coc    Nolberto Hanlon, MD [facility] Pager  [phone]  If 7PM-7AM, please contact night-coverage [email or web address] Password [facility] 12/30/2019, 8:23 AM

## 2019-12-31 ENCOUNTER — Other Ambulatory Visit (INDEPENDENT_AMBULATORY_CARE_PROVIDER_SITE_OTHER): Payer: Self-pay | Admitting: Nurse Practitioner

## 2019-12-31 ENCOUNTER — Encounter: Admission: EM | Disposition: E | Payer: Self-pay | Source: Ambulatory Visit | Attending: Internal Medicine

## 2019-12-31 ENCOUNTER — Encounter: Payer: Self-pay | Admitting: Vascular Surgery

## 2019-12-31 ENCOUNTER — Other Ambulatory Visit (INDEPENDENT_AMBULATORY_CARE_PROVIDER_SITE_OTHER): Payer: Self-pay | Admitting: Vascular Surgery

## 2019-12-31 DIAGNOSIS — I70244 Atherosclerosis of native arteries of left leg with ulceration of heel and midfoot: Secondary | ICD-10-CM

## 2019-12-31 DIAGNOSIS — L97523 Non-pressure chronic ulcer of other part of left foot with necrosis of muscle: Secondary | ICD-10-CM | POA: Diagnosis not present

## 2019-12-31 HISTORY — PX: LOWER EXTREMITY ANGIOGRAPHY: CATH118251

## 2019-12-31 LAB — GLUCOSE, CAPILLARY
Glucose-Capillary: 92 mg/dL (ref 70–99)
Glucose-Capillary: 99 mg/dL (ref 70–99)
Glucose-Capillary: 132 mg/dL — ABNORMAL HIGH (ref 70–99)
Glucose-Capillary: 165 mg/dL — ABNORMAL HIGH (ref 70–99)
Glucose-Capillary: 206 mg/dL — ABNORMAL HIGH (ref 70–99)
Glucose-Capillary: 236 mg/dL — ABNORMAL HIGH (ref 70–99)

## 2019-12-31 LAB — CULTURE, BLOOD (ROUTINE X 2)
Culture: NO GROWTH
Special Requests: ADEQUATE

## 2019-12-31 LAB — CREATININE, SERUM
Creatinine, Ser: 2.01 mg/dL — ABNORMAL HIGH (ref 0.61–1.24)
Creatinine, Ser: 2.16 mg/dL — ABNORMAL HIGH (ref 0.61–1.24)
GFR calc Af Amer: 35 mL/min — ABNORMAL LOW (ref >60)
GFR calc Af Amer: 32 mL/min — ABNORMAL LOW (ref >60)
GFR calc non Af Amer: 30 mL/min — ABNORMAL LOW (ref >60)
GFR calc non Af Amer: 28 mL/min — ABNORMAL LOW (ref >60)

## 2019-12-31 LAB — BUN: BUN: 60 mg/dL — ABNORMAL HIGH (ref 8–23)

## 2019-12-31 LAB — VANCOMYCIN, TROUGH: Vancomycin Tr: 18 ug/mL (ref 15–20)

## 2019-12-31 SURGERY — LOWER EXTREMITY ANGIOGRAPHY
Anesthesia: Moderate Sedation | Laterality: Left

## 2019-12-31 MED ORDER — HEPARIN SODIUM (PORCINE) 1000 UNIT/ML IJ SOLN
INTRAMUSCULAR | Status: DC | PRN
  Administered 2019-12-31: 4000 [IU] via INTRAVENOUS

## 2019-12-31 MED ORDER — IODIXANOL 320 MG/ML IV SOLN
INTRAVENOUS | Status: DC | PRN
  Administered 2019-12-31: 55 mL

## 2019-12-31 MED ORDER — CEFAZOLIN SODIUM-DEXTROSE 2-4 GM/100ML-% IV SOLN
2.0000 g | Freq: Once | INTRAVENOUS | Status: DC
  Filled 2019-12-31: qty 100

## 2019-12-31 MED ORDER — CEFAZOLIN SODIUM-DEXTROSE 1-4 GM/50ML-% IV SOLN
1.0000 g | Freq: Once | INTRAVENOUS | Status: AC
  Administered 2020-01-03: 2 g via INTRAVENOUS

## 2019-12-31 MED ORDER — DIPHENHYDRAMINE HCL 50 MG/ML IJ SOLN: 50.0000 mg | Freq: Once | INTRAMUSCULAR | Status: AC | PRN

## 2019-12-31 MED ORDER — ONDANSETRON HCL 4 MG/2ML IJ SOLN: 4.0000 mg | Freq: Four times a day (QID) | INTRAMUSCULAR | Status: DC | PRN

## 2019-12-31 MED ORDER — MIDAZOLAM HCL 5 MG/5ML IJ SOLN
INTRAMUSCULAR | Status: AC
  Filled 2019-12-31: qty 5

## 2019-12-31 MED ORDER — DIPHENHYDRAMINE HCL 50 MG/ML IJ SOLN
INTRAMUSCULAR | Status: AC
  Administered 2019-12-31: 50 mg via INTRAVENOUS
  Filled 2019-12-31: qty 1

## 2019-12-31 MED ORDER — MIDAZOLAM HCL 2 MG/2ML IJ SOLN
INTRAMUSCULAR | Status: DC | PRN
  Administered 2019-12-31: 2 mg via INTRAVENOUS

## 2019-12-31 MED ORDER — SODIUM CHLORIDE 0.9 % IV SOLN: INTRAVENOUS | Status: AC | PRN

## 2019-12-31 MED ORDER — HEPARIN SODIUM (PORCINE) 1000 UNIT/ML IJ SOLN
INTRAMUSCULAR | Status: AC
  Filled 2019-12-31: qty 1

## 2019-12-31 MED ORDER — DEXTROSE 5 % IV SOLN
1000.0000 mg | Freq: Once | INTRAVENOUS | Status: DC
  Administered 2019-12-31: 1000 mg via INTRAVENOUS

## 2019-12-31 MED ORDER — POLYETHYLENE GLYCOL 3350 17 G PO PACK
17.0000 g | PACK | Freq: Two times a day (BID) | ORAL | Status: DC
  Administered 2019-12-31 – 2020-01-05 (×7): 17 g via ORAL
  Filled 2019-12-31 (×9): qty 1

## 2019-12-31 MED ORDER — MIDAZOLAM HCL 2 MG/ML PO SYRP
8.0000 mg | ORAL_SOLUTION | Freq: Once | ORAL | Status: DC | PRN
  Filled 2019-12-31: qty 4

## 2019-12-31 MED ORDER — FAMOTIDINE 20 MG PO TABS: 40.0000 mg | ORAL_TABLET | Freq: Once | ORAL | Status: AC | PRN

## 2019-12-31 MED ORDER — METHYLPREDNISOLONE SODIUM SUCC 125 MG IJ SOLR
INTRAMUSCULAR | Status: AC
  Administered 2019-12-31: 125 mg via INTRAVENOUS
  Filled 2019-12-31: qty 2

## 2019-12-31 MED ORDER — FAMOTIDINE 20 MG PO TABS
ORAL_TABLET | ORAL | Status: AC
  Administered 2019-12-31: 40 mg via ORAL
  Filled 2019-12-31: qty 2

## 2019-12-31 MED ORDER — FENTANYL CITRATE (PF) 100 MCG/2ML IJ SOLN
INTRAMUSCULAR | Status: DC | PRN
  Administered 2019-12-31: 50 ug via INTRAVENOUS

## 2019-12-31 MED ORDER — FENTANYL CITRATE (PF) 100 MCG/2ML IJ SOLN: 12.5000 ug | Freq: Once | INTRAMUSCULAR | Status: DC | PRN

## 2019-12-31 MED ORDER — FENTANYL CITRATE (PF) 100 MCG/2ML IJ SOLN
INTRAMUSCULAR | Status: AC
  Filled 2019-12-31: qty 2

## 2019-12-31 MED ORDER — METHYLPREDNISOLONE SODIUM SUCC 125 MG IJ SOLR: 125.0000 mg | Freq: Once | INTRAMUSCULAR | Status: AC | PRN

## 2019-12-31 MED ORDER — SODIUM CHLORIDE 0.9 % IV SOLN: INTRAVENOUS | Status: DC

## 2019-12-31 SURGICAL SUPPLY — 24 items
BALLN STERLING OTW 2X100X150 (BALLOONS) ×3
BALLN ULTRVRSE 2X100X150 (BALLOONS) ×3
BALLN ULTRVRSE 2X150X150 (BALLOONS) ×3
BALLN ULTRVRSE 2X150X150 OTW (BALLOONS) ×1
BALLN ULTRVRSE 3X300X150 (BALLOONS) ×3
BALLN ULTRVRSE 3X300X150 OTW (BALLOONS) ×1
BALLOON STERLING OTW 2X100X150 (BALLOONS) IMPLANT
BALLOON ULTRVRSE 2X100X150 (BALLOONS) IMPLANT
BALLOON ULTRVRSE 2X150X150 OTW (BALLOONS) IMPLANT
BALLOON ULTRVRSE 3X300X150 OTW (BALLOONS) IMPLANT
CATH ANGIO 5F PIGTAIL 65CM (CATHETERS) ×2 IMPLANT
CATH BEACON 5 .038 100 VERT TP (CATHETERS) ×2 IMPLANT
CATH CXI SUPP ST 4FR 135CM (CATHETERS) ×2 IMPLANT
DEVICE PRESTO INFLATION (MISCELLANEOUS) ×2 IMPLANT
DEVICE STARCLOSE SE CLOSURE (Vascular Products) ×2 IMPLANT
GUIDEWIRE PFTE-COATED .018X300 (WIRE) ×2 IMPLANT
PACK ANGIOGRAPHY (CUSTOM PROCEDURE TRAY) ×3 IMPLANT
SHEATH BRITE TIP 5FRX11 (SHEATH) ×2 IMPLANT
SHEATH RAABE 6FR (SHEATH) ×2 IMPLANT
SYR MEDRAD MARK 7 150ML (SYRINGE) ×2 IMPLANT
TUBING CONTRAST HIGH PRESS 72 (TUBING) ×2 IMPLANT
WIRE G V18X300CM (WIRE) ×2 IMPLANT
WIRE J 3MM .035X145CM (WIRE) ×2 IMPLANT
WIRE MAGIC TORQUE 260C (WIRE) ×2 IMPLANT

## 2019-12-31 NOTE — Op Note (Signed)
[facility]  Percutaneous Study/Intervention Procedural Note   Date of Surgery: 01/14/2020  Surgeon(s):,    Assistants:none  Pre-operative Diagnosis: PAD with ulceration LLE  Post-operative diagnosis:  Same  Procedure(s) Performed:             1.  Ultrasound guidance for vascular access right femoral artery             2.  Catheter placement into left common femoral artery from right femoral approach             3.  Aortogram and selective left lower extremity lower extremity angiogram including selective imaging of all 3 tibial vessels             4.  Percutaneous transluminal angioplasty of left posterior tibial artery with a 2 mm diameter angioplasty balloon distally and a 3 mm diameter angioplasty balloon from the origin down to the mid to distal posterior tibial artery             5.   Percutaneous transluminal angioplasty of the left mid to distal peroneal artery with 2 mm diameter angioplasty balloon  6.  StarClose closure device right femoral artery  EBL: 10 cc  Contrast: 55 cc  Fluoro Time: 12.3 minutes  Moderate Conscious Sedation Time: approximately 45 minutes using 2 mg of Versed and 50 mcg of Fentanyl              Indications:  Patient is a 79 y.o.male with nonhealing ulceration and infection of the left foot. The patient is brought in for angiography for further evaluation and potential treatment.  Due to the limb threatening nature of the situation, angiogram was performed for attempted limb salvage. The patient is aware that if the procedure fails, amputation would be expected.  The patient also understands that even with successful revascularization, amputation may still be required due to the severity of the situation.  Risks and benefits are discussed and informed consent is obtained.   Procedure:  The patient was identified and appropriate procedural time out was performed.  The patient was then placed supine on the table and prepped  and draped in the usual sterile fashion. Moderate conscious sedation was administered during a face to face encounter with the patient throughout the procedure with my supervision of the RN administering medicines and monitoring the patient's vital signs, pulse oximetry, telemetry and mental status throughout from the start of the procedure until the patient was taken to the recovery room. Ultrasound was used to evaluate the right common femoral artery.  It was patent .  A digital ultrasound image was acquired.  A Seldinger needle was used to access the right common femoral artery under direct ultrasound guidance and a permanent image was performed.  A 0.035 J wire was advanced without resistance and a 5Fr sheath was placed.  Pigtail catheter was placed into the aorta and an AP aortogram was performed. This demonstrated normal renal arteries and normal aorta and iliac segments without significant stenosis. I then crossed the aortic bifurcation and advanced to the left femoral head. Selective left lower extremity angiogram was then performed. This demonstrated calcific but not stenotic common femoral artery, profunda femoris artery, superficial femoral artery, and popliteal arteries.  No stenosis of greater than 30% was seen in these vessels.  There was a typical tibial trifurcation with severe tibial disease.  This was difficult to opacify from the initial image.  We went ahead and put a 6 [place] sheath and  over a Magic torque wire and then advanced a Kumpe catheter down to evaluate the tibial vessels over a V 18 wire.  The anterior tibial artery was imaged first.  The Kumpe catheter was advanced in the proximal anterior tibial artery and selective imaging was performed and was found to have an occlusion about 6 to 8 cm beyond the origin without distal reconstitution.  I then advanced the Kumpe catheter into the origin of the peroneal artery which demonstrated a reasonably good artery in the proximal and mid segment  with an occlusion in the mid to distal segment with reconstitution of a very small distal peroneal artery.  The catheter was then flipped over into the proximal posterior tibial artery where selective imaging showed a greater than 90% high-grade stenosis about 10 cm beyond the origin.  The vessel then normalized but had multiple areas of greater than 70% stenosis in the mid and distal segment down to the foot where opacification in the foot was somewhat sluggish and small vessel disease was seen. It was felt that it was in the patient's best interest to proceed with intervention after these images to avoid a second procedure and a larger amount of contrast and fluoroscopy based off of the findings from the initial angiogram. The V 18 wire was used to go through the CXI catheter and remove this and parked the wire in the foot and the posterior tibial artery.  A 3 mm diameter by 30 cm length angioplasty balloon was inflated to 10 atm for 1 minute from the proximal posterior tibial artery down to the mid to distal posterior tibial artery.  A 2 mm diameter by 15 cm length angioplasty balloon was then inflated from the mid to distal posterior tibial artery and the posterior tibial artery at the foot.  Completion imaging showed significant improvement to the posterior tibial artery now with brisk flow to the foot although the small vessel disease persisted.  There is less than 25% residual stenosis in the areas that were previously stenotic prior to treatment.  I then turned my attention to the peroneal artery.  Using a V 18 initially but then exchanging for a 0.018 advantage wire was able to cross the occlusion without difficulty.  Getting the balloon across the very calcific occlusion was difficult.  I initially used a 2 mm diameter by 15 cm length balloon but on the initial attempt this did not entirely cross the lesion.  It was then inflated to 14 mm in this location.  An additional 2 mm diameter by 10 cm length  angioplasty balloon that was new was used and was able to cross the lesion with some difficulty.  Was then inflated to 12 atm for 1 minute.  Completion imaging showed a marked improvement with about a 30 to 40% residual stenosis although the peroneal artery did remain very small distally.  There is no further option for revascularization at this point and he had been significantly improved with our tibial interventions. I elected to terminate the procedure. The sheath was removed and StarClose closure device was deployed in the right femoral artery with excellent hemostatic result. The patient was taken to the recovery room in stable condition having tolerated the procedure well.  Findings:               Aortogram:  Renal arteries appeared normal although they were not particularly well seen potentially due to catheter position.  The aorta and iliac arteries were widely patent without significant stenosis  Left lower Extremity:  Calcific but not stenotic common femoral artery, profunda femoris artery, superficial femoral artery, and popliteal arteries.  No stenosis of greater than 30% was seen in these vessels.  There was a typical tibial trifurcation with severe tibial disease.  This was difficult to opacify from the initial image.  We went ahead and put a 6 French sheath and over a Magic torque wire and then advanced a Kumpe catheter down to evaluate the tibial vessels over a V 18 wire.  The anterior tibial artery was imaged first.  The Kumpe catheter was advanced in the proximal anterior tibial artery and selective imaging was performed and was found to have an occlusion about 6 to 8 cm beyond the origin without distal reconstitution.  I then advanced the Kumpe catheter into the origin of the peroneal artery which demonstrated a reasonably good artery in the proximal and mid segment with an occlusion in the mid to distal segment with reconstitution of a very small distal peroneal artery.  The  catheter was then flipped over into the proximal posterior tibial artery where selective imaging showed a greater than 90% high-grade stenosis about 10 cm beyond the origin.  The vessel then normalized but had multiple areas of greater than 70% stenosis in the mid and distal segment down to the foot where opacification in the foot was somewhat sluggish and small vessel disease was seen.   Disposition: Patient was taken to the recovery room in stable condition having tolerated the procedure well.  Complications: None  Leotis Pain 12/29/2019 12:52 PM   This note was created with Dragon Medical transcription system. Any errors in dictation are purely unintentional.

## 2019-12-31 NOTE — H&P (Signed)
[facility] History & Physical Update  The patient was interviewed and re-examined.  The patient's previous History and Physical has been reviewed and is unchanged.  There is no change in the plan of care. We plan to proceed with the scheduled procedure.  Leotis Pain, MD  01/13/2020, 8:11 AM

## 2019-12-31 NOTE — Progress Notes (Signed)
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    Consultants:  Podiatry Vascular  Procedures:   Antimicrobials:   Metronidazole and ceftriaxone, vancomycin   Subjective: Pain controlled.  Has no complaints.  No nausea or vomiting.  Nursing bedside  Objective: Vitals:   12/30/19 0900 12/30/19 1554 12/23/2019 0004 01/16/2020 0808  BP: (!) 134/57 139/62 (!) 143/68 (!) 152/72  Pulse: 86 82 90 82  Resp: _0 Temp: 98.2 F (36.8 C) 98.5 F (36.9 C) 99.2 F (37.3 C) 97.7 F (36.5 C)  TempSrc: Oral Oral Oral Oral  SpO2: 99% 98% 97% 100%  Weight:      Height:        Intake/Output Summary (Last 24 hours) at 01/10/2020 0849 Last data filed at 01/12/2020 0810 Gross per 24 hour  Intake 1080 ml  Output 1575 ml  Net -495 ml   Filed Weights   01/07/2020 1533  Weight: 75.8 kg    Examination:  General exam: Appears calm and comfortable, NAD Respiratory system: Clear to auscultation. Respiratory effort normal.  No wheeze rales rhonchi's Cardiovascular system: S1 & S2 heard, RRR.  S1-S2 no murmurs   Gastrointestinal system: Abdomen is nondistended, soft and nontender. Normal bowel sounds heard.  No rebound Central nervous system: Alert and oriented grossly intact Extremities:LE no edema. Pulses faint. Psychiatry:  Mood & affect appropriate in setting.     Data Reviewed: I have personally reviewed following labs and imaging studies  CBC: Recent Labs  Lab 12/20/2019 1545 12/27/19 0646 12/28/19 0518  12/29/19 0608 12/30/19 0432  WBC 22.4* 16.7* 15.3* 15.4* 12.0*  NEUTROABS 20.0*  --   --   --   --   HGB 12.1* 10.7* 10.3* 10.3* 10.6*  HCT 36.3* 31.8* 30.1* 30.6* 32.1*  MCV 78.6* 78.5* 78.4* 78.3* 78.7*  PLT 316 258 261 279 710   Basic Metabolic Panel: Recent Labs  Lab 12/28/2019 1545 12/27/2019 1545 12/27/19 0646 12/28/19 0518 12/29/19 0608 12/30/19 0432 01/06/2020 0523  NA 131*  --  129* 130* 129* 130*  --   K 5.0  --  4.7 4.2 4.8 4.9  --   CL 98  --  99 100 98 99  --   CO2 21*  --  19* 21* 24 22  --   GLUCOSE 362*  --  277* 239* 178* 158*  --   BUN 40*  --  43* 47* 53* 55*  --   CREATININE 1.95*   < > 2.12* 2.01* 2.18* 2.12* 2.01*  CALCIUM 8.6*  --  8.1* 8.1* 8.3* 8.5*  --   MG  --   --   --  1.3* 2.2 2.0  --    < > = values in this interval not displayed.   GFR: Estimated Creatinine Clearance: 30.9 mL/min (A) (by C-G formula based on SCr of 2.01 mg/dL (H)). Liver Function Tests: Recent Labs  Lab 12/21/2019 1545  AST 28  ALT 27  ALKPHOS 102  BILITOT 0.7  PROT 7.6  ALBUMIN 3.2*   No results for input(s): LIPASE, AMYLASE in the last 168 hours. No  results for input(s): AMMONIA in the last 168 hours. Coagulation Profile: No results for input(s): INR, PROTIME in the last 168 hours. Cardiac Enzymes: No results for input(s): CKTOTAL, CKMB, CKMBINDEX, TROPONINI in the last 168 hours. BNP (last 3 results) No results for input(s): PROBNP in the last 8760 hours. HbA1C: No results for input(s): HGBA1C in the last 72 hours. CBG: Recent Labs  Lab 12/30/19 0859 12/30/19 1147 12/30/19 1555 12/30/19 2139 01/02/2020 0810  GLUCAP 153* 114* 150* 97 92   Lipid Profile: No results for input(s): CHOL, HDL, LDLCALC, TRIG, CHOLHDL, LDLDIRECT in the last 72 hours. Thyroid Function Tests: No results for input(s): TSH, T4TOTAL, FREET4, T3FREE, THYROIDAB in the last 72 hours. Anemia Panel: No results for input(s): VITAMINB12, FOLATE, FERRITIN, TIBC, IRON, RETICCTPCT in the last 72  hours. Sepsis Labs: Recent Labs  Lab 12/28/2019 1545 12/25/2019 1833  LATICACIDVEN 2.9* 1.5    Recent Results (from the past 240 hour(s))  Blood Cultures x 2 sites     Status: None   Collection Time: 12/20/2019  8:48 PM   Specimen: BLOOD  Result Value Ref Range Status   Specimen Description BLOOD BLOOD LEFT FOREARM  Final   Special Requests   Final    BOTTLES DRAWN AEROBIC AND ANAEROBIC Blood Culture adequate volume   Culture   Final    NO GROWTH 5 DAYS Performed at [facility], [address], [place], [place] [address]    Report Status 01/02/2020 FINAL  Final  Culture, blood (Routine X 2) w Reflex to ID Panel     Status: None (Preliminary result)   Collection Time: 12/27/19  7:01 PM   Specimen: BLOOD  Result Value Ref Range Status   Specimen Description BLOOD BLOOD RIGHT HAND  Final   Special Requests   Final    BOTTLES DRAWN AEROBIC AND ANAEROBIC Blood Culture adequate volume   Culture   Final    NO GROWTH 4 DAYS Performed at [facility], [address], [place], [place] [address]    Report Status PENDING  Incomplete  SARS Coronavirus 2 by RT PCR (hospital order, performed in [place] hospital lab) Nasopharyngeal Nasopharyngeal Swab     Status: None   Collection Time: 12/27/19  8:45 PM   Specimen: Nasopharyngeal Swab  Result Value Ref Range Status   SARS Coronavirus 2 NEGATIVE NEGATIVE Final    Comment: (NOTE) SARS-CoV-2 target nucleic acids are NOT DETECTED.  The SARS-CoV-2 RNA is generally detectable in upper and lower respiratory specimens during the acute phase of infection. The lowest concentration of SARS-CoV-2 viral copies this assay can detect is 250 copies / mL. A negative result does not preclude SARS-CoV-2 infection and should not be used as the sole basis for treatment or other patient management decisions.  A negative result may occur with improper specimen collection / handling, submission of specimen other than nasopharyngeal swab,  presence of viral mutation(s) within the areas targeted by this assay, and inadequate number of viral copies (<250 copies / mL). A negative result must be combined with clinical observations, patient history, and epidemiological information.  Fact Sheet for Patients:   StrictlyIdeas.no  Fact Sheet for Healthcare Providers: BankingDealers.co.za  This test is not yet approved or  cleared by the [place] FDA and has been authorized for detection and/or diagnosis of SARS-CoV-2 by FDA under an Emergency Use Authorization (EUA).  This EUA will remain in effect (meaning this test can be used) for the duration of the COVID-19 declaration under  Section 564(b)(1) of the Act, 21 U.S.C. section 360bbb-3(b)(1), unless the authorization is terminated or revoked sooner.  Performed at [facility], [address] [place], [place] [address]   MRSA PCR Screening     Status: None   Collection Time: 12/27/19  8:45 PM   Specimen: Nasopharyngeal  Result Value Ref Range Status   MRSA by PCR NEGATIVE NEGATIVE Final    Comment:        The GeneXpert MRSA Assay (FDA approved for NASAL specimens only), is one component of a comprehensive MRSA colonization surveillance program. It is not intended to diagnose MRSA infection nor to guide or monitor treatment for MRSA infections. Performed at [facility], [address] [place], [place] [address]          Radiology Studies: DG Abd 1 View  Result Date: 12/29/2019 CLINICAL DATA:  Nausea and vomiting EXAM: ABDOMEN - 1 VIEW COMPARISON:  07/31/2015 FINDINGS: Two supine frontal views of the abdomen and pelvis demonstrate an unremarkable bowel gas pattern. No masses or abnormal calcifications. Lung bases are clear. No acute bony abnormalities. IMPRESSION: 1. Unremarkable bowel gas pattern. Electronically Signed   By: Randa Ngo M.D.   On: 12/29/2019 19:31        Scheduled  Meds: . amLODipine  10 mg Oral Daily  . aspirin EC  81 mg Oral Daily  . Chlorhexidine Gluconate Cloth  6 each Topical Daily  . fluticasone  2 spray Each Nare Daily  . heparin  5,000 Units Subcutaneous Q8H  . insulin aspart  0-15 Units Subcutaneous TID WC  . insulin aspart  7 Units Subcutaneous TID WC  . insulin glargine  25 Units Subcutaneous Daily  . loratadine  10 mg Oral Daily  . multivitamin with minerals  1 tablet Oral Daily  . nutrition supplement (JUVEN)  1 packet Oral BID BM  . pantoprazole  40 mg Oral Daily  . Ensure Max Protein  11 oz Oral Daily  . senna-docusate  2 tablet Oral BID  . simvastatin  10 mg Oral QHS  . sodium chloride flush  3 mL Intravenous Once  . tamsulosin  0.4 mg Oral Daily   Continuous Infusions: . sodium chloride 1,000 mL (12/29/19 2050)  . cefTRIAXone (ROCEPHIN)  IV 2 g (12/30/19 2033)  . metronidazole 500 mg (12/21/2019 0836)  . 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 Outpatient x-ray reportssoft tissue defect beneath the third metatarsal with possible early gas. -started on empiric antibiotics with IV vancomycin and ceftriaxone, oral metronidazole Podiatry and vascular consulted 7/11-WBC decreasing Per podiatry patient is at high risk of foot amputation due to progressive gangrenous changes and infection, debridement alone may not suffice, will reevaluate on Monday Plan: Status post  angiography and revascularization by Dr. Lazaro Arms this a.m. Pain control -We will continue vancomycin, ceftriaxone, and metronidazole -Pain control Continue application of  Betadine wet-to-dry dressing applied to the left foot PT/OT  N/V and constipation 2/2  opioids use --Pt started complaining of N/V today, and hasn't had BM since presentation.  Pt has been requesting Norco x2 q4h 7/11 patient reports this a.m. resolved PLAN:  -Continue bowel regimen with MiraLAX and senna Antiemetics as needed   Insulin-dependent type 2 diabeteswith hyperglycemia: Reports taking Lantus 18 units daily at home. Has not had consistent PCP follow-up. Continue Lantus 25 mg daily R ISS Continue mealtime insulin 7 units 3 times daily FS checks BG stable.  CKD stage IIIb: Creatinine 1.95 with EGFR 31. No recent baseline, prior labs August 2019 showed creatinine 2.24 with EGFR 26. At baseline with today 2.16 Continue to monitor  CAD s/p CABG: Chronic and stable, denies any chest pain.  First-degree AV block with bradycardias/p PPM: EKG reveals paced rhythm  Microcytic anemia: Hemoglobin stable at 12.1 without obvious bleeding.  H&H currently stable , no labs this am.  Ck am labs  Hypertension: Currently stable with spikes of elevation continue home amlodipine, hold losartan for now.  Hyperlipidemia: Continue statin  BPH/urinary retentionwith chronic suprapubic catheter: Chronic and stable, continue care.   DVT prophylaxis: Heparin SQ Code Status: Full code  Family Communication:  Disposition Plan:  Status is: Inpatient  Remains inpatient appropriate because:Ongoing diagnostic testing needed not appropriate for outpatient work up   Dispo: The patient is from: Home              Anticipated d/c is to: home vs rehab, will need evaluation at time of dc              Anticipated d/c date is: 3 days              Patient currently is not medically stable to d/c.             LOS: 5 days   Time spent: 45 min with >50% on coc    Nolberto Hanlon, MD [facility] Pager [phone]  If 7PM-7AM, please contact night-coverage [email or web address] Password [facility] 12/20/2019, 8:49 AM

## 2019-12-31 NOTE — Progress Notes (Signed)
Day of Surgery   Subjective/Chief Complaint: Patient seen.  States the foot may be feels a little better.   Objective: Vital signs in last 24 hours: Temp:  [97.5 F (36.4 C)-99.2 F (37.3 C)] 97.7 F (36.5 C) (07/12 1528) Pulse Rate:  [82-108] 105 (07/12 1528) Resp:  [9-18] 16 (07/12 1528) BP: (95-155)/(60-78) 146/72 (07/12 1528) SpO2:  [97 %-100 %] 100 % (07/12 1528) Last BM Date: 12/25/19 (per Pt)  Intake/Output from previous day: 07/11 0701 - 07/12 0700 In: 1080 [P.O.:480; IV Piggyback:600] Out: 1150 [Urine:1150] Intake/Output this shift: No intake/output data recorded.  Still moderate drainage noted on the bandaging.  Upon removal there is continued progressive necrosis with gangrenous changes extending through the central arch back to the rear foot area.  Some crepitus and air can be palpated along the plantar arch.    Lab Results:  Recent Labs    12/29/19 0608 12/30/19 0432  WBC 15.4* 12.0*  HGB 10.3* 10.6*  HCT 30.6* 32.1*  PLT 279 316   BMET Recent Labs    12/29/19 0608 12/29/19 0608 12/30/19 0432 12/30/19 0432 01/08/2020 0523 01/01/2020 0942  NA 129*  --  130*  --   --   --   K 4.8  --  4.9  --   --   --   CL 98  --  99  --   --   --   CO2 24  --  22  --   --   --   GLUCOSE 178*  --  158*  --   --   --   BUN 53*   < > 55*  --   --  60*  CREATININE 2.18*   < > 2.12*   < > 2.01* 2.16*  CALCIUM 8.3*  --  8.5*  --   --   --    < > = values in this interval not displayed.   PT/INR No results for input(s): LABPROT, INR in the last 72 hours. ABG No results for input(s): PHART, HCO3 in the last 72 hours.  Invalid input(s): PCO2, PO2  Studies/Results: PERIPHERAL VASCULAR CATHETERIZATION  Result Date: 12/27/2019 See op note   Anti-infectives: Anti-infectives (From admission, onward)   Start     Dose/Rate Route Frequency Ordered Stop   01/18/2020 1215  ceFAZolin (ANCEF) IVPB 1 g/50 mL premix     Discontinue     1 g 100 mL/hr over 30 Minutes  Intravenous  Once 01/05/2020 1206     12/30/2019 1200  ceFAZolin (ANCEF) 1,000 mg in dextrose 5 % 100 mL IVPB  Status:  Discontinued        1,000 mg 220 mL/hr over 30 Minutes Intravenous  Once 12/22/2019 1159 01/02/2020 1205   01/10/2020 1000  ceFAZolin (ANCEF) IVPB 2g/100 mL premix  Status:  Discontinued        2 g 200 mL/hr over 30 Minutes Intravenous  Once 01/01/2020 0932 01/02/2020 1159   12/30/19 0830  metroNIDAZOLE (FLAGYL) IVPB 500 mg     Discontinue     500 mg 100 mL/hr over 60 Minutes Intravenous Every 8 hours 12/30/19 0822     12/27/19 1900  vancomycin (VANCOCIN) IVPB 1000 mg/200 mL premix  Status:  Discontinued        1,000 mg 200 mL/hr over 60 Minutes Intravenous Every 24 hours 12/27/2019 2043 12/27/19 1217   12/27/19 1800  vancomycin (VANCOREADY) IVPB 750 mg/150 mL     Discontinue     750 mg 150  mL/hr over 60 Minutes Intravenous Every 24 hours 12/27/19 1217     01/03/2020 2200  metroNIDAZOLE (FLAGYL) tablet 500 mg  Status:  Discontinued        500 mg Oral Every 8 hours 12/23/2019 2009 12/30/19 0822   12/28/2019 2045  vancomycin (VANCOREADY) IVPB 500 mg/100 mL        500 mg 100 mL/hr over 60 Minutes Intravenous  Once 01/05/2020 2040 12/27/19 0000   01/11/2020 2015  cefTRIAXone (ROCEPHIN) 2 g in sodium chloride 0.9 % 100 mL IVPB     Discontinue     2 g 200 mL/hr over 30 Minutes Intravenous Every 24 hours 12/25/2019 2009     12/24/2019 1815  vancomycin (VANCOCIN) IVPB 1000 mg/200 mL premix        1,000 mg 200 mL/hr over 60 Minutes Intravenous  Once 01/16/2020 1801 01/13/2020 2023   01/10/2020 1815  piperacillin-tazobactam (ZOSYN) IVPB 3.375 g        3.375 g 100 mL/hr over 30 Minutes Intravenous  Once 01/19/2020 1801 01/10/2020 1953      Assessment/Plan: s/p Procedure(s): Lower Extremity Angiography (Left) Assessment: Gangrene left foot, progressive with peripheral vascular disease.   Plan: Discussed with the patient that at this point I do not think that partial amputation of his forefoot is a viable option  due to the extent of the gangrene and tissue loss and infection.  We discussed that I think this will put him through a second operation when he will eventually require a below-knee amputation.  Recommended that we allow vascular surgery to take over and hopefully proceed with below-knee amputation later this week.  Patient is somewhat upset but understanding of the above conversation and would like to proceed with amputation.  LOS: 5 days    Durward Fortes 01/03/2020

## 2019-12-31 NOTE — Progress Notes (Signed)
Pharmacy Antibiotic Note  [name] is a 79 y.o. male admitted on 12/29/2019 with wound infection.  Pharmacy has been consulted for Vancomycin dosing. Patient presents from podiatry office w/ left foot ulcer requiring angiography and revascularization, possible need for amputation.  Noted to have CKD.  Today, 12/25/2019 Day #5 vancomycin/ceftriaxone/metronidazole - SCr stable - WBC improving - afebrile  - vancomycin trough = ____ mcg/ml on 750mg  IV q24h (prior to 6th overall dose)  Plan:  continue vancomycin 750mg  IV every 24 hours based on [place] Antimicrobial Dosing Guidelines.   Check vancomycin trough with today's evening dose  Follow renal function  Await possible surgical intervention after angiography today  Height: 6\' 2"  (188 cm) Weight: 75.8 kg (167 lb) IBW/kg (Calculated) : 82.2  Temp (24hrs), Avg:98.4 F (36.9 C), Min:97.7 F (36.5 C), Max:99.2 F (37.3 C)  Recent Labs  Lab 01/08/2020 1545 01/05/2020 1545 12/24/2019 1833 12/27/19 0646 12/28/19 0518 12/29/19 0608 12/30/19 0432 01/10/2020 0523  WBC 22.4*  --   --  16.7* 15.3* 15.4* 12.0*  --   CREATININE 1.95*   < >  --  2.12* 2.01* 2.18* 2.12* 2.01*  LATICACIDVEN 2.9*  --  1.5  --   --   --   --   --    < > = values in this interval not displayed.    Estimated Creatinine Clearance: 30.9 mL/min (A) (by C-G formula based on SCr of 2.01 mg/dL (H)).    Allergies  Allergen Reactions  . Fluorescein     Other reaction(s): Nausea And Vomiting  . Ivp Dye [Iodinated Diagnostic Agents] Other (See Comments)    Reaction:  Unknown   . Morphine Nausea Only  . Other     Pain meds by mouth FA dye Unknown reaction  . Codeine Nausea And Vomiting and Rash    Antimicrobials this admission: 7/7 flagyl>>  7/7 CTX>> 7/7 vancomycin>>  7/7 Zosyn x 1   Microbiology results:  BCx: NGTD  MRSA PCR: neg  Thank you for allowing pharmacy to be a part of this patient's care.  Doreene Eland, PharmD, BCPS.   Work  Cell: [phone] 12/27/2019 8:39 AM

## 2019-12-31 NOTE — Progress Notes (Signed)
Pharmacy Antibiotic Note  [name] is a 79 y.o. male admitted on 12/23/2019 with wound infection.  Pharmacy has been consulted for Vancomycin dosing. Patient presents from podiatry office w/ left foot ulcer requiring angiography and revascularization, possible need for amputation.  Noted to have CKD.  Today, 01/14/2020 Day #5 vancomycin/ceftriaxone/metronidazole - SCr stable - WBC improving - afebrile  - vancomycin trough = ___18_ mcg/ml on 750mg  IV q24h (prior to 6th overall dose)  Plan:  continue vancomycin 750mg  IV every 24 hours based on [place] Antimicrobial Dosing Guidelines.   Re-check vancomycin trough at 07/16 at 1700 prior to 4th dose.  Follow renal function  Height: 6\' 2"  (188 cm) Weight: 75.8 kg (167 lb) IBW/kg (Calculated) : 82.2  Temp (24hrs), Avg:98 F (36.7 C), Min:97.5 F (36.4 C), Max:99.2 F (37.3 C)  Recent Labs  Lab 01/09/2020 1545 12/28/2019 1545 01/15/2020 1833 12/27/19 0646 12/27/19 0646 12/28/19 0518 12/29/19 2563 12/30/19 0432 12/22/2019 0523 12/22/2019 0942 12/30/2019 1800  WBC 22.4*  --   --  16.7*  --  15.3* 15.4* 12.0*  --   --   --   CREATININE 1.95*   < >  --  2.12*   < > 2.01* 2.18* 2.12* 2.01* 2.16*  --   LATICACIDVEN 2.9*  --  1.5  --   --   --   --   --   --   --   --   VANCOTROUGH  --   --   --   --   --   --   --   --   --   --  18   < > = values in this interval not displayed.    Estimated Creatinine Clearance: 28.8 mL/min (A) (by C-G formula based on SCr of 2.16 mg/dL (H)).    Allergies  Allergen Reactions  . Ivp Dye [Iodinated Diagnostic Agents] Other (See Comments)    Reaction:  Unknown   . Other     Pain meds by mouth FA dye Unknown reaction  . Codeine Nausea And Vomiting and Rash    Antimicrobials this admission: 7/7 flagyl>>  7/7 CTX>> 7/7 vancomycin>>  7/7 Zosyn x 1   Microbiology results:  BCx: NGTD  MRSA PCR: neg  Thank you for allowing pharmacy to be a part of this patient's care.  Tobie Lords,  PharmD, BCPS Clinical Pharmacist 01/10/2020 10:42 PM

## 2020-01-01 ENCOUNTER — Encounter: Admission: EM | Disposition: E | Payer: Self-pay | Source: Ambulatory Visit | Attending: Internal Medicine

## 2020-01-01 DIAGNOSIS — L97523 Non-pressure chronic ulcer of other part of left foot with necrosis of muscle: Secondary | ICD-10-CM | POA: Diagnosis not present

## 2020-01-01 LAB — GLUCOSE, CAPILLARY
Glucose-Capillary: 338 mg/dL — ABNORMAL HIGH (ref 70–99)
Glucose-Capillary: 350 mg/dL — ABNORMAL HIGH (ref 70–99)
Glucose-Capillary: 292 mg/dL — ABNORMAL HIGH (ref 70–99)

## 2020-01-01 LAB — CULTURE, BLOOD (ROUTINE X 2)
Culture: NO GROWTH
Special Requests: ADEQUATE

## 2020-01-01 SURGERY — AMPUTATION, FOOT, TRANSMETATARSAL
Anesthesia: General | Site: Toe | Laterality: Left

## 2020-01-01 MED ORDER — INSULIN GLARGINE 100 UNIT/ML ~~LOC~~ SOLN
30.0000 [IU] | Freq: Every day | SUBCUTANEOUS | Status: DC
  Administered 2020-01-02: 30 [IU] via SUBCUTANEOUS
  Filled 2020-01-01 (×2): qty 0.3

## 2020-01-01 MED ORDER — INSULIN ASPART 100 UNIT/ML ~~LOC~~ SOLN
10.0000 [IU] | Freq: Three times a day (TID) | SUBCUTANEOUS | Status: DC
  Administered 2020-01-01 – 2020-01-07 (×14): 10 [IU] via SUBCUTANEOUS
  Filled 2020-01-01 (×15): qty 1

## 2020-01-01 MED ORDER — POLYVINYL ALCOHOL 1.4 % OP SOLN
2.0000 [drp] | OPHTHALMIC | Status: DC | PRN
  Filled 2020-01-01: qty 15

## 2020-01-01 MED ORDER — FLEET ENEMA 7-19 GM/118ML RE ENEM
1.0000 | ENEMA | Freq: Once | RECTAL | Status: AC
  Administered 2020-01-01: 1 via RECTAL

## 2020-01-01 MED ORDER — FLEET ENEMA 7-19 GM/118ML RE ENEM
1.0000 | ENEMA | Freq: Every day | RECTAL | Status: DC | PRN
  Administered 2020-01-09 – 2020-01-11 (×2): 1 via RECTAL

## 2020-01-01 MED ORDER — LIDOCAINE 5 % EX PTCH
1.0000 | MEDICATED_PATCH | CUTANEOUS | Status: DC
  Administered 2020-01-01 – 2020-01-17 (×9): 1 via TRANSDERMAL
  Filled 2020-01-01 (×17): qty 1

## 2020-01-01 MED ORDER — GABAPENTIN 100 MG PO CAPS
100.0000 mg | ORAL_CAPSULE | Freq: Every day | ORAL | Status: DC
  Administered 2020-01-01 – 2020-01-16 (×14): 100 mg via ORAL
  Filled 2020-01-01 (×14): qty 1

## 2020-01-01 NOTE — Progress Notes (Addendum)
[facility] Daily Progress Note   Subjective: 12/21/2019: 1. Ultrasound guidance for vascular access right femoral artery 2. Catheter placement into left common femoral artery from right femoral approach 3. Aortogram and selective left lower extremity lower extremity angiogram including selective imaging of all 3 tibial vessels 4. Percutaneous transluminal angioplasty of left posterior tibial artery with a 2 mm diameter angioplasty balloon distally and a 3 mm diameter angioplasty balloon from the origin down to the mid to distal posterior tibial artery 5.  Percutaneous transluminal angioplasty of the left mid to distal peroneal artery with 2 mm diameter angioplasty balloon             6. StarClose closure device right femoral artery  Patient continues to complain of left lower extremity discomfort.  Objective: Vitals:   01/08/2020 2008 01/06/2020 0002 12/29/2019 0408 01/19/2020 0720  BP: (!) 145/69 139/68 (!) 142/65 (!) 149/76  Pulse: 95 81 74 91  Resp: 20 16 16 19   Temp: 98 F (36.7 C) 98.1 F (36.7 C) 97.9 F (36.6 C) 97.6 F (36.4 C)  TempSrc: Oral Oral Oral Oral  SpO2: 100% 97% 98% 99%  Weight:      Height:        Intake/Output Summary (Last 24 hours) at 01/08/2020 1118 Last data filed at 01/03/2020 1004 Gross per 24 hour  Intake 680 ml  Output 900 ml  Net -220 ml   Physical Exam: A&Ox3, NAD CV: RRR Pulmonary: CTA Bilaterally Abdomen: Soft, Nontender, Nondistended Right groin:  Access site: Clean dry intact.  No swelling or drainage. Vascular:  Left lower extremity: Thigh soft.  Calf soft.  Toes warm.  Podiatry dressing in place clean and dry.   Laboratory: CBC    Component Value Date/Time   WBC 12.0 (H) 12/30/2019 0432   HGB 10.6 (L) 12/30/2019 0432   HGB 13.0 08/15/2013 0505   HCT 32.1 (L) 12/30/2019 0432   HCT 37.6 (L) 08/15/2013 0505   PLT 316 12/30/2019 0432   PLT 171  08/15/2013 0505   BMET    Component Value Date/Time   NA 130 (L) 12/30/2019 0432   NA 133 (L) 08/06/2015 1032   NA 130 (L) 08/15/2013 0505   K 4.9 12/30/2019 0432   K 4.7 08/15/2013 0505   CL 99 12/30/2019 0432   CL 100 08/15/2013 0505   CO2 22 12/30/2019 0432   CO2 25 08/15/2013 0505   GLUCOSE 158 (H) 12/30/2019 0432   GLUCOSE 215 (H) 08/15/2013 0505   BUN 60 (H) 12/24/2019 0942   BUN 24 08/06/2015 1032   BUN 34 (H) 08/15/2013 0505   CREATININE 2.16 (H) 01/09/2020 0942   CREATININE 1.70 (H) 08/15/2013 0505   CALCIUM 8.5 (L) 12/30/2019 0432   CALCIUM 8.7 08/15/2013 0505   GFRNONAA 28 (L) 12/22/2019 0942   GFRNONAA 39 (L) 08/15/2013 0505   GFRAA 32 (L) 01/03/2020 0942   GFRAA 45 (L) 08/15/2013 0505   Assessment/Planning: The patient is an 79 year old male with chronic, nonsalvageable foot wound as per podiatry status post left lower extremity angiogram with successful revascularization.  1) Chronic Left Foot Wound: Patient underwent successful endovascular revascularization of the left extremity however there was significant micro vessel disease to the foot. 2) Podiatry feels at this point, the patient's foot wounds have progressed to the point that the foot is not salvageable. 3) Due to micro vessel disease with chronic and nonsalvageable foot wounds recommend a below the knee amputation.  Recent angiogram conducted yesterday  shows adequate blood flow to heal a below the knee amputation 4) Patient is in agreement with plan 5) will reach out to social work for assistance in regard to SNF placement. Patient receives services from [place].  Discussed with Dr. Ellis Parents Onika Gudiel PA-C 01/16/2020 11:18 AM

## 2020-01-01 NOTE — Progress Notes (Signed)
Order received from Dr. [name] for fleets enema per rectum once now and then prn daily for sever constipation.

## 2020-01-01 NOTE — Progress Notes (Signed)
Inpatient Diabetes Program Recommendations  AACE/ADA: New Consensus Statement on Inpatient Glycemic Control (2015)  Target Ranges:  Prepandial:   less than 140 mg/dL      Peak postprandial:   less than 180 mg/dL (1-2 hours)      Critically ill patients:  140 - 180 mg/dL   Results for [name], [name] (MRN [id number]) as of 12/20/2019 13:50  Ref. Range 01/03/2020 08:10 12/23/2019 10:18 01/11/2020 12:50 12/26/2019 15:41 01/08/2020 16:49 01/14/2020 20:05  Glucose-Capillary Latest Ref Range: 70 - 99 mg/dL 92 99  125 mg Solumedrol given X 1 dose 132 (H) 165 (H) 206 (H)  12 units NOVOLOG  236 (H)   Results for [name], CIAVARELLA (MRN [id number]) as of 12/24/2019 13:50  Ref. Range 01/11/2020 07:22 12/31/2019 11:43  Glucose-Capillary Latest Ref Range: 70 - 99 mg/dL 338 (H) 350 (H)    Current Orders: Lantus 25 units Daily      Novolog Moderate Correction Scale/ SSI (0-15 units) TID AC      Novolog 7 units TID with meals     CBGs running high today likely b/c pt did NOT get his 25 units Lantus yesterday--Was off the unit getting Vascular Procedure.  Patient was also given 125 mg Solumedrol X 1 dose yesterday as well.  Lantus was given this AM.  Do not recommend any upward adjustments of pt's Insulins today due to the above info.    --Will follow patient during hospitalization--  Wyn Quaker RN, MSN, CDE Diabetes Coordinator Inpatient Glycemic Control Team Team Pager: [phone] (8a-5p)'

## 2020-01-01 NOTE — Progress Notes (Signed)
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    Consultants:  Podiatry Vascular  Procedures:   Antimicrobials:   Metronidazole and ceftriaxone, vancomycin   Subjective: C/o pain this am. Per nsg no bowel movements.  Denies abdominal pain, chest pain, shortness of breath.  Objective: Vitals:   12/20/2019 2008 01/04/2020 0002 01/06/2020 0408 01/07/2020 0720  BP: (!) 145/69 139/68 (!) 142/65 (!) 149/76  Pulse: 95 81 74 91  Resp: '20 16 16 19  '$ Temp: 98 F (36.7 C) 98.1 F (36.7 C) 97.9 F (36.6 C) 97.6 F (36.4 C)  TempSrc: Oral Oral Oral Oral  SpO2: 100% 97% 98% 99%  Weight:      Height:        Intake/Output Summary (Last 24 hours) at 01/15/2020 0825 Last data filed at 12/31/2019 0745 Gross per 24 hour  Intake 200 ml  Output 900 ml  Net -700 ml   Filed Weights   01/16/2020 1533  Weight: 75.8 kg    Examination:  General exam: Sitting in bed, finished breakfast, NAD Respiratory system: CTA, no wheeze rales rhonchi's  Cardiovascular system: Regular S1-S2 no murmurs rubs gallops  Gastrointestinal system: Soft, NT/ND positive bowel sounds  Central nervous system: Alert oriented x3 grossly intact Extremities: LLE wrapped in dressing did not open,  No edema of RLE Skin: Warm dry Psychiatry: Judgement and insight appear normal. Mood & affect appropriate.     Data Reviewed: I have personally reviewed following labs and imaging studies  CBC: Recent Labs  Lab 12/29/2019 1545 12/27/19 0646 12/28/19 0518  12/29/19 0608 12/30/19 0432  WBC 22.4* 16.7* 15.3* 15.4* 12.0*  NEUTROABS 20.0*  --   --   --   --   HGB 12.1* 10.7* 10.3* 10.3* 10.6*  HCT 36.3* 31.8* 30.1* 30.6* 32.1*  MCV 78.6* 78.5* 78.4* 78.3* 78.7*  PLT 316 258 261 279 536   Basic Metabolic Panel: Recent Labs  Lab 12/22/2019 1545 01/09/2020 1545 12/27/19 0646 12/27/19 0646 12/28/19 0518 12/29/19 0608 12/30/19 0432 01/04/2020 0523 12/22/2019 0942  NA 131*  --  129*  --  130* 129* 130*  --   --   K 5.0  --  4.7  --  4.2 4.8 4.9  --   --   CL 98  --  99  --  100 98 99  --   --   CO2 21*  --  19*  --  21* 24 22  --   --   GLUCOSE 362*  --  277*  --  239* 178* 158*  --   --   BUN 40*   < > 43*  --  47* 53* 55*  --  60*  CREATININE 1.95*   < > 2.12*   < > 2.01* 2.18* 2.12* 2.01* 2.16*  CALCIUM 8.6*  --  8.1*  --  8.1* 8.3* 8.5*  --   --   MG  --   --   --   --  1.3* 2.2 2.0  --   --    < > = values in this interval not displayed.  GFR: Estimated Creatinine Clearance: 28.8 mL/min (A) (by C-G formula based on SCr of 2.16 mg/dL (H)). Liver Function Tests: Recent Labs  Lab 01/16/2020 1545  AST 28  ALT 27  ALKPHOS 102  BILITOT 0.7  PROT 7.6  ALBUMIN 3.2*   No results for input(s): LIPASE, AMYLASE in the last 168 hours. No results for input(s): AMMONIA in the last 168 hours. Coagulation Profile: No results for input(s): INR, PROTIME in the last 168 hours. Cardiac Enzymes: No results for input(s): CKTOTAL, CKMB, CKMBINDEX, TROPONINI in the last 168 hours. BNP (last 3 results) No results for input(s): PROBNP in the last 8760 hours. HbA1C: No results for input(s): HGBA1C in the last 72 hours. CBG: Recent Labs  Lab 01/14/2020 1250 12/23/2019 1541 12/26/2019 1649 01/08/2020 2005 01/19/2020 0722  GLUCAP 132* 165* 206* 236* 338*   Lipid Profile: No results for input(s): CHOL, HDL, LDLCALC, TRIG, CHOLHDL, LDLDIRECT in the last 72 hours. Thyroid Function Tests: No results for input(s): TSH, T4TOTAL, FREET4, T3FREE, THYROIDAB in  the last 72 hours. Anemia Panel: No results for input(s): VITAMINB12, FOLATE, FERRITIN, TIBC, IRON, RETICCTPCT in the last 72 hours. Sepsis Labs: Recent Labs  Lab 01/13/2020 1545 01/09/2020 1833  LATICACIDVEN 2.9* 1.5    Recent Results (from the past 240 hour(s))  Blood Cultures x 2 sites     Status: None   Collection Time: 01/10/2020  8:48 PM   Specimen: BLOOD  Result Value Ref Range Status   Specimen Description BLOOD BLOOD LEFT FOREARM  Final   Special Requests   Final    BOTTLES DRAWN AEROBIC AND ANAEROBIC Blood Culture adequate volume   Culture   Final    NO GROWTH 5 DAYS Performed at [facility], [address] [place], [place] [address]    Report Status 01/04/2020 FINAL  Final  Culture, blood (Routine X 2) w Reflex to ID Panel     Status: None   Collection Time: 12/27/19  7:01 PM   Specimen: BLOOD  Result Value Ref Range Status   Specimen Description BLOOD BLOOD RIGHT HAND  Final   Special Requests   Final    BOTTLES DRAWN AEROBIC AND ANAEROBIC Blood Culture adequate volume   Culture   Final    NO GROWTH 5 DAYS Performed at [facility], [address], [place], [place] [address]    Report Status 12/26/2019 FINAL  Final  SARS Coronavirus 2 by RT PCR (hospital order, performed in [facility] hospital lab) Nasopharyngeal Nasopharyngeal Swab     Status: None   Collection Time: 12/27/19  8:45 PM   Specimen: Nasopharyngeal Swab  Result Value Ref Range Status   SARS Coronavirus 2 NEGATIVE NEGATIVE Final    Comment: (NOTE) SARS-CoV-2 target nucleic acids are NOT DETECTED.  The SARS-CoV-2 RNA is generally detectable in upper and lower respiratory specimens during the acute phase of infection. The lowest concentration of SARS-CoV-2 viral copies this assay can detect is 250 copies / mL. A negative result does not preclude SARS-CoV-2 infection and should not be used as the sole basis for treatment or other patient management decisions.  A negative result  may occur with improper specimen collection / handling, submission of specimen other than nasopharyngeal swab, presence of viral mutation(s) within the areas targeted by this assay, and inadequate number of viral copies (<250 copies / mL). A negative result must be combined with clinical observations, patient history, and epidemiological information.  Fact Sheet for Patients:   StrictlyIdeas.no  Fact Sheet for  Healthcare Providers: BankingDealers.co.za  This test is not yet approved or  cleared by the [place] and has been authorized for detection and/or diagnosis of SARS-CoV-2 by FDA under an Emergency Use Authorization (EUA).  This EUA will remain in effect (meaning this test can be used) for the duration of the COVID-19 declaration under Section 564(b)(1) of the Act, 21 U.S.C. section 360bbb-3(b)(1), unless the authorization is terminated or revoked sooner.  Performed at [facility], [address] [place], [place] [address]   MRSA PCR Screening     Status: None   Collection Time: 12/27/19  8:45 PM   Specimen: Nasopharyngeal  Result Value Ref Range Status   MRSA by PCR NEGATIVE NEGATIVE Final    Comment:        The GeneXpert MRSA Assay (FDA approved for NASAL specimens only), is one component of a comprehensive MRSA colonization surveillance program. It is not intended to diagnose MRSA infection nor to guide or monitor treatment for MRSA infections. Performed at [facility], [address], [place],  [address]          Radiology Studies: PERIPHERAL VASCULAR CATHETERIZATION  Result Date: 01/12/2020 See op note       Scheduled Meds: . amLODipine  10 mg Oral Daily  . aspirin EC  81 mg Oral Daily  . Chlorhexidine Gluconate Cloth  6 each Topical Daily  . fluticasone  2 spray Each Nare Daily  . heparin  5,000 Units Subcutaneous Q8H  . insulin aspart  0-15 Units Subcutaneous  TID WC  . insulin aspart  7 Units Subcutaneous TID WC  . insulin glargine  25 Units Subcutaneous Daily  . loratadine  10 mg Oral Daily  . multivitamin with minerals  1 tablet Oral Daily  . nutrition supplement (JUVEN)  1 packet Oral BID BM  . pantoprazole  40 mg Oral Daily  . polyethylene glycol  17 g Oral BID  . Ensure Max Protein  11 oz Oral Daily  . senna-docusate  2 tablet Oral BID  . simvastatin  10 mg Oral QHS  . sodium chloride flush  3 mL Intravenous Once  . tamsulosin  0.4 mg Oral Daily   Continuous Infusions: . sodium chloride    .  ceFAZolin (ANCEF) IV    . cefTRIAXone (ROCEPHIN)  IV 2 g (12/30/2019 1950)  . metronidazole 500 mg (01/08/2020 0746)  . 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 Outpatient x-ray reportssoft tissue defect beneath the third metatarsal with possible early gas. -started on empiric antibiotics with IV vancomycin and ceftriaxone, oral metronidazole Podiatry and vascular consulted 7/11-WBC decreasing Per podiatry patient is at high risk of foot amputation due to progressive gangrenous changes and infection, debridement alone may not suffice 7/12-s/p Angiography and revascularization by Dr. Lucky Cowboy- Plan:  Podiatry following-plan for BKA later this week Continue vancomycin, ceftriaxone.  Change oral metronidazole to IV -Pain control Will apply Betadine wet-to-dry dressing applied to the  left  foot Will need SNF placement   N/V and constipation 2/2 opioids use --Pt had complaining of N/V and hasn't had BM since presentation.  Pt has been requesting Norco x2 q4h 7/11 patient reports this a.m. resolved No BM yet PLAN:  Add Enema to regimen.   Insulin-dependent type 2 diabeteswith hyperglycemia: Reports taking Lantus 18 units daily at home. Has not had consistent PCP follow-up. BG currently uncontrolled PLAN: Continue Lantus 30 mg daily R ISS Continue mealtime insulin 10 units 3 times daily Check fingersticks  CKD stage III: Creatinine 1.95 with EGFR 31. No recent baseline, prior labs August 2019 showed creatinine 2.24 with EGFR 26. Today's creatinine 2.16 Continue to monitor  CAD s/p CABG: Chronic and stable, denies any chest pain.  First-degree AV block with bradycardias/p PPM: EKG reveals paced rhythm  Microcytic anemia: Hemoglobin stable at 12.1 without obvious bleeding.  H&H currently stable  We will monitor for bleed   Hypertension: Currently stable. Continue home amlodipine, hold losartan for now.  Hyperlipidemia: Continue statin  BPH/urinary retentionwith chronic suprapubic catheter: Chronic and stable, continue care.   DVT prophylaxis: Heparin SQ Code Status: Full code  Family Communication: None at bedside  disposition Plan: Likely SNF to be determined Status is: Inpatient  Remains inpatient appropriate because:Ongoing diagnostic testing needed not appropriate for outpatient work up   Dispo: The patient is from: Home              Anticipated d/c is to: home vs rehab, will need evaluation at time of dc likely                   be SNF              Anticipated d/c date is: 3 days              Patient currently is not medically stable to d/c.            LOS: 6 days   Time spent: 45 min with >50% on coc    Nolberto Hanlon, MD [facility] Pager [phone]  If 7PM-7AM, please contact  night-coverage [email or web address] Password [email or web address] 01/03/2020, 8:25 AM

## 2020-01-01 NOTE — TOC Progression Note (Signed)
Transition of Care [facility]) - Progression Note    Patient Details  Name: [name] MRN: [id number] Date of Birth: Oct 26, 1940  Transition of Care [facility]) CM/SW [place], RN Phone Number: 01/09/2020, 11:28 AM  Clinical Narrative:     Met with the patient to discuss DC plan and needs, He will be getting Amputation later and wants to go to rehab after, We will do a bedsearch when closer to time to DC,   Expected Discharge Plan: [place] Barriers to Discharge: Continued Medical Work up  Expected Discharge Plan and Services Expected Discharge Plan: [place] arrangements for the past 2 months: Single Family Home                                       Social Determinants of Health (SDOH) Interventions    Readmission Risk Interventions No flowsheet data found.

## 2020-01-02 DIAGNOSIS — E1159 Type 2 diabetes mellitus with other circulatory complications: Secondary | ICD-10-CM

## 2020-01-02 DIAGNOSIS — I10 Essential (primary) hypertension: Secondary | ICD-10-CM | POA: Diagnosis not present

## 2020-01-02 DIAGNOSIS — N1832 Chronic kidney disease, stage 3b: Secondary | ICD-10-CM | POA: Diagnosis not present

## 2020-01-02 DIAGNOSIS — L97523 Non-pressure chronic ulcer of other part of left foot with necrosis of muscle: Secondary | ICD-10-CM | POA: Diagnosis not present

## 2020-01-02 LAB — GLUCOSE, CAPILLARY
Glucose-Capillary: 120 mg/dL — ABNORMAL HIGH (ref 70–99)
Glucose-Capillary: 160 mg/dL — ABNORMAL HIGH (ref 70–99)
Glucose-Capillary: 101 mg/dL — ABNORMAL HIGH (ref 70–99)
Glucose-Capillary: 61 mg/dL — ABNORMAL LOW (ref 70–99)
Glucose-Capillary: 64 mg/dL — ABNORMAL LOW (ref 70–99)
Glucose-Capillary: 68 mg/dL — ABNORMAL LOW (ref 70–99)

## 2020-01-02 LAB — CREATININE, SERUM
Creatinine, Ser: 2.21 mg/dL — ABNORMAL HIGH (ref 0.61–1.24)
GFR calc Af Amer: 31 mL/min — ABNORMAL LOW (ref >60)
GFR calc non Af Amer: 27 mL/min — ABNORMAL LOW (ref >60)

## 2020-01-02 LAB — ABO/RH: ABO/RH(D): A POS

## 2020-01-02 MED ORDER — SODIUM CHLORIDE 0.9 % IV SOLN
300.0000 mg | Freq: Once | INTRAVENOUS | Status: AC
  Administered 2020-01-02: 300 mg via INTRAVENOUS
  Filled 2020-01-02: qty 15

## 2020-01-02 MED ORDER — SODIUM CHLORIDE 0.9 % IV SOLN: INTRAVENOUS | Status: DC

## 2020-01-02 NOTE — Evaluation (Signed)
Physical Therapy Evaluation Patient Details Name: [name] MRN: [id number] DOB: 03-14-1941 Today's Date: 01/02/2020   History of Present Illness  Pt is an 79 y/o M with PMH: PAD, CAD s/p CABG, Bradycardia s/p PPM (2021), HTN, DM II, HLD, CKD, BPH, urinary retention with chronic suprapubic catheter, and HTN. Pt underwent L LE angiogram and revascularization on 7/12 but still with significant microvessel disease, diabetic ulcer and gangrene of the L foot and is to undergo L BKA on 7/15.  MD assessment also includes: N&V, constipation, hyperglycemia, and microcytic anemia.    Clinical Impression  Pt anxious during the session with frequent encouragement for participation needed during the session.  Pt with increased pain in dependent position with nursing notified of pt's desire for increased pain medication.  Pt required min A with bed mobility and transfers and was unable to maintain LLE NWB status in standing.  Pt is expected to have a L BKA on 7/15 with HEP education provided and reviewed and then will be re-evaluated after the procedure.  Pt will benefit from PT services in a SNF setting upon discharge to safely address deficits listed in patient problem list for decreased caregiver assistance and eventual return to PLOF.     Follow Up Recommendations SNF    Equipment Recommendations  Rolling walker with 5" wheels    Recommendations for Other Services       Precautions / Restrictions Precautions Precautions: Fall Precaution Comments: Suprapubic catheter Restrictions Weight Bearing Restrictions: Yes LLE Weight Bearing: Non weight bearing Other Position/Activity Restrictions: Per chart review verbal order to treat L foot as NWB      Mobility  Bed Mobility Overal bed mobility: Needs Assistance Bed Mobility: Supine to Sit;Sit to Supine     Supine to sit: Modified independent (Device/Increase time) Sit to supine: Min assist   General bed mobility comments: Extra time and  effort during sup to sit and then min A for LLE control during sit to supine  Transfers Overall transfer level: Needs assistance Equipment used: Rolling walker (2 wheeled) Transfers: Sit to/from Stand Sit to Stand: From elevated surface;Min assist         General transfer comment: Mod to max verbal cues for sequencing for LLE NWB compliance with pt able to stand initially with L foot off of the floor but after 15-20 sec started putting L foot down and was returned to sitting  Ambulation/Gait             General Gait Details: Unable  Stairs            Wheelchair Mobility    Modified Rankin (Stroke Patients Only)       Balance Overall balance assessment: Needs assistance   Sitting balance-Leahy Scale: Good     Standing balance support: Bilateral upper extremity supported Standing balance-Leahy Scale: Poor                               Pertinent Vitals/Pain Pain Assessment: 0-10 Pain Score: 4  Pain Location: states 4 at rest, but cannot tolerate any pressure to L foot Pain Descriptors / Indicators: Aching;Sore;Throbbing Pain Intervention(s): Premedicated before session;Monitored during session;Patient requesting pain meds-RN notified;Repositioned    Home Living Family/patient expects to be discharged to:: Private residence Living Arrangements: Alone Available Help at Discharge: Family;Available PRN/intermittently Type of Home: Independent living facility Home Access: Level entry     Home Layout: One level Home Equipment: Walker - 4  wheels;Bedside commode;Grab bars - tub/shower;Shower seat - built in      Prior Function Level of Independence: Independent with assistive device(s)         Comments: Mod Ind amb with a rollator, no falls in the last year, Ind with ADLs; DIL and SIL assist with groceries/errands or uses [employer], no longer drives     Hand Dominance   Dominant Hand: Right    Extremity/Trunk Assessment   Upper Extremity  Assessment Upper Extremity Assessment: Defer to OT evaluation    Lower Extremity Assessment Lower Extremity Assessment: Generalized weakness LLE: Unable to fully assess due to pain       Communication   Communication: No difficulties  Cognition Arousal/Alertness: Awake/alert Behavior During Therapy: WFL for tasks assessed/performed Overall Cognitive Status: Within Functional Limits for tasks assessed                                 General Comments: some confusion with d/c planning concepts.      General Comments      Exercises Total Joint Exercises Ankle Circles/Pumps: AROM;Strengthening;Both;10 reps Quad Sets: Strengthening;Both;10 reps Gluteal Sets: Strengthening;Both;10 reps Hip ABduction/ADduction: Strengthening;Both;10 reps Straight Leg Raises: Strengthening;Both;10 reps Long Arc Quad: Strengthening;Both;10 reps Knee Flexion: Strengthening;Both;10 reps Other Exercises Other Exercises: HEP education for BLE APs, QS, GS, and LAQs x 10 each every 1-2 hours daily Other Exercises: OT facilitates education with pt re: importance of OOB mobility for PNA prevention and prevention of skin breakdown. In addition, OT educates pt re: positioning, making sure to not have bony prominences pressed agaist any surface (pt's feet touching footboard when OT presents. Pt somewhat receptive, will require continued education.   Assessment/Plan    PT Assessment Patient needs continued PT services  PT Problem List Decreased strength;Decreased activity tolerance;Decreased balance;Decreased mobility;Decreased knowledge of use of DME;Decreased knowledge of precautions;Pain       PT Treatment Interventions DME instruction;Gait training;Functional mobility training;Therapeutic activities;Therapeutic exercise;Balance training;Patient/family education    PT Goals (Current goals can be found in the Care Plan section)  Acute Rehab PT Goals Patient Stated Goal: To get stronger and  get back home PT Goal Formulation: With patient Time For Goal Achievement: 01/15/20 Potential to Achieve Goals: Fair    Frequency Min 2X/week   Barriers to discharge Inaccessible home environment;Decreased caregiver support      Co-evaluation               AM-PAC PT "6 Clicks" Mobility  Outcome Measure Help needed turning from your back to your side while in a flat bed without using bedrails?: A Little Help needed moving from lying on your back to sitting on the side of a flat bed without using bedrails?: A Little Help needed moving to and from a bed to a chair (including a wheelchair)?: A Lot Help needed standing up from a chair using your arms (e.g., wheelchair or bedside chair)?: A Little Help needed to walk in hospital room?: Total Help needed climbing 3-5 steps with a railing? : Total 6 Click Score: 13    End of Session Equipment Utilized During Treatment: Gait belt Activity Tolerance: Patient tolerated treatment well Patient left: in bed;with call bell/phone within reach;with bed alarm set;Other (comment) (Prevalon boot to L foot) Nurse Communication: Mobility status PT Visit Diagnosis: Unsteadiness on feet (R26.81);Difficulty in walking, not elsewhere classified (R26.2);Muscle weakness (generalized) (M62.81);Pain Pain - Right/Left: Left Pain - part of body: Ankle and joints of  foot    Time: 4709-2957 PT Time Calculation (min) (ACUTE ONLY): 29 min   Charges:   PT Evaluation $PT Eval Moderate Complexity: 1 Mod PT Treatments $Therapeutic Exercise: 8-22 mins        D. Royetta Asal PT, DPT 01/02/20, 3:41 PM

## 2020-01-02 NOTE — Progress Notes (Signed)
[facility] Daily Progress Note   Subjective: 01/11/2020: 1.Ultrasound guidance for vascular accessrightfemoral artery 2.Catheter placement into left common femoral artery from right femoral approach 3.Aortogram and selectiveleft lower extremitylower extremity angiogramincluding selective imaging of all 3 tibial vessels 4.Percutaneous transluminal angioplasty ofleft posterior tibial artery with a 2 mm diameter angioplasty balloon distally and a 3 mm diameter angioplasty balloon from the origin down to the mid to distal posterior tibial artery 5.Percutaneous transluminal angioplasty of the left mid to distal peroneal artery with 2 mm diameter angioplasty balloon 6.StarClose closure device rightfemoral artery  Patient with continued left lower extremity pain.  Wound is stable. Patient with continued left lower extremity pain. Wound is stable.  Objective: Vitals:   12/27/2019 0720 01/19/2020 1554 01/16/2020 2324 01/02/20 0813  BP: (!) 149/76 (!) 144/74 135/68 125/65  Pulse: 91 89 85 76  Resp: 19 16 20 18   Temp: 97.6 F (36.4 C) 98 F (36.7 C) 98.1 F (36.7 C) 97.8 F (36.6 C)  TempSrc: Oral Oral Oral   SpO2: 99% 99% 97% 98%  Weight:      Height:        Intake/Output Summary (Last 24 hours) at 01/02/2020 1015 Last data filed at 01/02/2020 0950 Gross per 24 hour  Intake 240 ml  Output 1425 ml  Net -1185 ml   Physical Exam: A&Ox3, NAD CV: RRR Pulmonary: CTA Bilaterally Abdomen: Soft, Nontender, Nondistended Right groin:             Access site: Clean dry intact.  No swelling or drainage. Vascular:             Left lower extremity: Thigh soft.  Calf soft.  Toes warm.  Podiatry dressing in place clean and dry.   Laboratory: CBC    Component Value Date/Time   WBC 12.0 (H) 12/30/2019 0432   HGB 10.6 (L) 12/30/2019 0432   HGB 13.0 08/15/2013 0505   HCT 32.1 (L) 12/30/2019  0432   HCT 37.6 (L) 08/15/2013 0505   PLT 316 12/30/2019 0432   PLT 171 08/15/2013 0505   BMET    Component Value Date/Time   NA 130 (L) 12/30/2019 0432   NA 133 (L) 08/06/2015 1032   NA 130 (L) 08/15/2013 0505   K 4.9 12/30/2019 0432   K 4.7 08/15/2013 0505   CL 99 12/30/2019 0432   CL 100 08/15/2013 0505   CO2 22 12/30/2019 0432   CO2 25 08/15/2013 0505   GLUCOSE 158 (H) 12/30/2019 0432   GLUCOSE 215 (H) 08/15/2013 0505   BUN 60 (H) 01/10/2020 0942   BUN 24 08/06/2015 1032   BUN 34 (H) 08/15/2013 0505   CREATININE 2.21 (H) 01/02/2020 0733   CREATININE 1.70 (H) 08/15/2013 0505   CALCIUM 8.5 (L) 12/30/2019 0432   CALCIUM 8.7 08/15/2013 0505   GFRNONAA 27 (L) 01/02/2020 0733   GFRNONAA 39 (L) 08/15/2013 0505   GFRAA 31 (L) 01/02/2020 0733   GFRAA 45 (L) 08/15/2013 0505   Assessment/Planning: The patient is an 79 year old male with chronic, nonsalvageable foot wound as per podiatry status post left lower extremity angiogram with successful revascularization.  1) Chronic Left Foot Wound: Patient underwent successful endovascular revascularization of the left extremity however there was significant micro vessel disease to the foot. 2) Podiatry feels at this point, the patient's foot wounds have progressed to the point that the foot is not salvageable. 3) Due to micro vessel disease with chronic and nonsalvageable foot wounds recommend a below the  knee amputation.  Recent angiogram conducted yesterday shows adequate blood flow to heal a below the knee amputation 4)  had a conversation about procedure, risks and benefits and postoperative course with the patient in the presence of occupational therapy and social work. All questions were answered. Patient continues to consent.  Discussed with Dr. Ellis Parents Balen Woolum PA-C 01/02/2020 10:15 AM

## 2020-01-02 NOTE — Progress Notes (Signed)
PROGRESS NOTE    [name]  DJS:[id number] DOB: 1940-07-16 DOA: 12/25/2019 PCP: Olin Hauser, DO   Chief complaint.  Pain in the left leg. 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 Patient was started on IV vancomycin, Rocephin and Flagyl, podiatry and vascular surgeon were consulted.  Patient had a angiogram and revascularization on 7/12.  But the foot gangrene does not seem to be improving, as a result, planning for below-knee amputation.    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 Continue antibiotics with vancomycin, Rocephin and Flagyl.  Other culture negative.  Pending below-knee amputation 3 PM tomorrow.  2.  Constipation with nausea vomiting. Had a bowel movement today after the stool softener, nausea vomiting has improved.  3.  Chronic kidney disease stage IIIb. Continue to follow.  Recheck a BMP tomorrow.  4.  Severe iron deficient anemia. We will give IV iron.  Patient is scheduled to have amputation tomorrow.  5.  Essential hypertension. Continue Norvasc.  Losartan on hold.  6.  Hyponatremia. No evidence of dehydration.  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  Will follow.  Fluid  restriction.     DVT prophylaxis: Heparin Code Status: Full Family Communication: None at bedside Disposition Plan:  . Patient came from: Home            . Anticipated d/c place:Possible SNF . Barriers to d/c OR conditions which need to be met to effect a safe d/c:   Consultants:   Podiatry  Vascular surgery  Procedures: SP lower extremity angiogram, pending BKA. Antimicrobials:  Vancomycin, Rocephin and Flagyl.  Subjective: Patient still has some pain in the left leg.  Had a large bowel movement today.  No nausea vomiting.  No fever or chills.  No dysuria hematuria.  Objective: Vitals:   12/20/2019 0720 01/05/2020 1554 01/08/2020 2324 01/02/20 0813  BP: (!) 149/76 (!) 144/74 135/68 125/65  Pulse: 91 89 85 76  Resp: '19 16 20 18  '$ Temp: 97.6 F (36.4 C) 98 F (36.7 C) 98.1 F (36.7 C) 97.8 F (36.6 C)  TempSrc: Oral Oral Oral   SpO2: 99% 99% 97% 98%  Weight:      Height:        Intake/Output Summary (Last 24 hours) at 01/02/2020 1536 Last data filed at 01/02/2020 1345 Gross per 24 hour  Intake 320 ml  Output 1175 ml  Net -855 ml   Filed Weights   01/14/2020 1533  Weight: 75.8 kg    Examination:  General exam: Appears calm and comfortable  Respiratory system: Clear to auscultation. Respiratory effort normal. Cardiovascular system: S1 & S2 heard, RRR. No JVD, murmurs, rubs, gallops or clicks. No pedal edema. Gastrointestinal system: Abdomen is nondistended, soft and nontender. No organomegaly or masses felt. Normal bowel sounds heard. Central nervous system: Alert  and oriented. No focal neurological deficits. Extremities: Symmetric, left foot gangrene. Skin: No rashes, lesions or ulcers Psychiatry: Judgement and insight appear normal. Mood & affect appropriate.     Data Reviewed: I have personally reviewed following labs and imaging studies  CBC: Recent Labs  Lab 01/09/2020 1545 12/27/19 0646 12/28/19 0518 12/29/19 0608 12/30/19 0432  WBC 22.4* 16.7*  15.3* 15.4* 12.0*  NEUTROABS 20.0*  --   --   --   --   HGB 12.1* 10.7* 10.3* 10.3* 10.6*  HCT 36.3* 31.8* 30.1* 30.6* 32.1*  MCV 78.6* 78.5* 78.4* 78.3* 78.7*  PLT 316 258 261 279 976   Basic Metabolic Panel: Recent Labs  Lab 01/10/2020 1545 12/28/2019 1545 12/27/19 0646 12/27/19 0646 12/28/19 0518 12/28/19 0518 12/29/19 7341 12/30/19 0432 01/15/2020 0523 01/03/2020 0942 01/02/20 0733  NA 131*  --  129*  --  130*  --  129* 130*  --   --   --   K 5.0  --  4.7  --  4.2  --  4.8 4.9  --   --   --   CL 98  --  99  --  100  --  98 99  --   --   --   CO2 21*  --  19*  --  21*  --  24 22  --   --   --   GLUCOSE 362*  --  277*  --  239*  --  178* 158*  --   --   --   BUN 40*   < > 43*  --  47*  --  53* 55*  --  60*  --   CREATININE 1.95*   < > 2.12*   < > 2.01*   < > 2.18* 2.12* 2.01* 2.16* 2.21*  CALCIUM 8.6*  --  8.1*  --  8.1*  --  8.3* 8.5*  --   --   --   MG  --   --   --   --  1.3*  --  2.2 2.0  --   --   --    < > = values in this interval not displayed.   GFR: Estimated Creatinine Clearance: 28.1 mL/min (A) (by C-G formula based on SCr of 2.21 mg/dL (H)). Liver Function Tests: Recent Labs  Lab 01/06/2020 1545  AST 28  ALT 27  ALKPHOS 102  BILITOT 0.7  PROT 7.6  ALBUMIN 3.2*   No results for input(s): LIPASE, AMYLASE in the last 168 hours. No results for input(s): AMMONIA in the last 168 hours. Coagulation Profile: No results for input(s): INR, PROTIME in the last 168 hours. Cardiac Enzymes: No results for input(s): CKTOTAL, CKMB, CKMBINDEX, TROPONINI in the last 168 hours. BNP (last 3 results) No results for input(s): PROBNP in the last 8760 hours. HbA1C: No results for input(s): HGBA1C in the last 72 hours. CBG: Recent Labs  Lab 01/03/2020 0722 12/30/2019 1143 01/01/20 1558 01/02/20 0920 01/02/20 1156  GLUCAP 338* 350* 292* 120* 160*   Lipid Profile: No results for input(s): CHOL, HDL, LDLCALC, TRIG, CHOLHDL, LDLDIRECT in the last 72 hours. Thyroid Function  Tests: No results for input(s): TSH, T4TOTAL, FREET4, T3FREE, THYROIDAB in the last 72 hours. Anemia Panel: No results for input(s): VITAMINB12, FOLATE, FERRITIN, TIBC, IRON, RETICCTPCT in the last 72 hours. Sepsis Labs: Recent Labs  Lab 01/04/2020 1545 12/22/2019 1833  LATICACIDVEN 2.9* 1.5    Recent Results (from the past 240 hour(s))  Blood  Cultures x 2 sites     Status: None   Collection Time: 12/24/2019  8:48 PM   Specimen: BLOOD  Result Value Ref Range Status   Specimen Description BLOOD BLOOD LEFT FOREARM  Final   Special Requests   Final    BOTTLES DRAWN AEROBIC AND ANAEROBIC Blood Culture adequate volume   Culture   Final    NO GROWTH 5 DAYS Performed at [facility], [address] [place], [place] [address]    Report Status 12/31/2019 FINAL  Final  Culture, blood (Routine X 2) w Reflex to ID Panel     Status: None   Collection Time: 12/27/19  7:01 PM   Specimen: BLOOD  Result Value Ref Range Status   Specimen Description BLOOD BLOOD RIGHT HAND  Final   Special Requests   Final    BOTTLES DRAWN AEROBIC AND ANAEROBIC Blood Culture adequate volume   Culture   Final    NO GROWTH 5 DAYS Performed at [facility], [address], [place], [place] [address]    Report Status 01/15/2020 FINAL  Final  SARS Coronavirus 2 by RT PCR (hospital order, performed in [facility] hospital lab) Nasopharyngeal Nasopharyngeal Swab     Status: None   Collection Time: 12/27/19  8:45 PM   Specimen: Nasopharyngeal Swab  Result Value Ref Range Status   SARS Coronavirus 2 NEGATIVE NEGATIVE Final    Comment: (NOTE) SARS-CoV-2 target nucleic acids are NOT DETECTED.  The SARS-CoV-2 RNA is generally detectable in upper and lower respiratory specimens during the acute phase of infection. The lowest concentration of SARS-CoV-2 viral copies this assay can detect is 250 copies / mL. A negative result does not preclude SARS-CoV-2 infection and should not be used as the sole  basis for treatment or other patient management decisions.  A negative result may occur with improper specimen collection / handling, submission of specimen other than nasopharyngeal swab, presence of viral mutation(s) within the areas targeted by this assay, and inadequate number of viral copies (<250 copies / mL). A negative result must be combined with clinical observations, patient history, and epidemiological information.  Fact Sheet for Patients:   StrictlyIdeas.no  Fact Sheet for Healthcare Providers: BankingDealers.co.za  This test is not yet approved or  cleared by the [place] FDA and has been authorized for detection and/or diagnosis of SARS-CoV-2 by FDA under an Emergency Use Authorization (EUA).  This EUA will remain in effect (meaning this test can be used) for the duration of the COVID-19 declaration under Section 564(b)(1) of the Act, 21 U.S.C. section 360bbb-3(b)(1), unless the authorization is terminated or revoked sooner.  Performed at [facility], [address] [place], [place] [address]   MRSA PCR Screening     Status: None   Collection Time: 12/27/19  8:45 PM   Specimen: Nasopharyngeal  Result Value Ref Range Status   MRSA by PCR NEGATIVE NEGATIVE Final    Comment:        The GeneXpert MRSA Assay (FDA approved for NASAL specimens only), is one component of a comprehensive MRSA colonization surveillance program. It is not intended to diagnose MRSA infection nor to guide or monitor treatment for MRSA infections. Performed at [facility], [address] [place], [place] [address]          Radiology Studies: No results found.      Scheduled Meds: . amLODipine  10 mg Oral Daily  . aspirin EC  81 mg Oral Daily  .  Chlorhexidine Gluconate Cloth  6 each Topical Daily  . fluticasone  2 spray Each Nare Daily  . gabapentin  100 mg Oral QHS  . heparin  5,000 Units  Subcutaneous Q8H  . insulin aspart  0-15 Units Subcutaneous TID WC  . insulin aspart  10 Units Subcutaneous TID WC  . insulin glargine  30 Units Subcutaneous Daily  . lidocaine  1 patch Transdermal Q24H  . loratadine  10 mg Oral Daily  . multivitamin with minerals  1 tablet Oral Daily  . nutrition supplement (JUVEN)  1 packet Oral BID BM  . pantoprazole  40 mg Oral Daily  . polyethylene glycol  17 g Oral BID  . Ensure Max Protein  11 oz Oral Daily  . senna-docusate  2 tablet Oral BID  . simvastatin  10 mg Oral QHS  . sodium chloride flush  3 mL Intravenous Once  . tamsulosin  0.4 mg Oral Daily   Continuous Infusions: . sodium chloride    . [START ON 01/06/2020] sodium chloride    .  ceFAZolin (ANCEF) IV    . cefTRIAXone (ROCEPHIN)  IV 2 g (12/31/2019 2006)  . metronidazole 500 mg (01/02/20 0957)  . vancomycin 750 mg (12/22/2019 1713)     LOS: 7 days    Time spent: 27 minutes    Sharen Hones, MD [facility]   To contact the attending provider between 7A-7P or the covering provider during after hours 7P-7A, please log into the web site [email or web address] and access using universal [place] password for that web site. If you do not have the password, please call the hospital operator.  01/02/2020, 3:36 PM

## 2020-01-02 NOTE — Evaluation (Signed)
Occupational Therapy Evaluation Patient Details Name: [name] MRN: [id number] DOB: [birth year] Today's Date: 01/02/2020    History of Present Illness Pt is 79 y/o M with PMH: PAD, CAD s/p CABG, Bradycardia s/p PPM (2021), HTN, T2DM, HLD. Underwent L LE angiogram and revascularization on 7/12 but still with significant microvessel disease. 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 Plan for patient to undergo L BKA on 7/15.   Clinical Impression   Pt was seen for OT evaluation this date. Prior to hospital admission, pt was MOD I with fxl mobility and some aspects of self care. Pt lives in [place] apartment with level entry. Currently pt demonstrates impairments as described below (See OT problem list) which functionally limit his ability to perform ADL/self-care tasks. Pt currently requires MOD/MAX A for LB ADLs and is limited with mobility 2/2 pain. Pt scheduled for L BKA tomorrow (7/15) at 1545.  Pt would benefit from continued skilled OT t/o acute stay to address noted impairments and functional limitations (see below for any additional details). Upon hospital discharge, recommend STR to maximize pt safety and return to PLOF while minimizing falls risk and caregiver burden.      Follow Up Recommendations  SNF    Equipment Recommendations  Other (comment) (defer to next venue of care)    Recommendations for Other Services       Precautions / Restrictions Precautions Precautions: Fall Restrictions Weight Bearing Restrictions: Yes Other Position/Activity Restrictions: verbal from PA-Stegmayer-to treat as NWB as pt cannot tolerate weight through L LE at this time and plan for BKA next date.      Mobility Bed Mobility               General bed mobility comments: pt declines to perform this initially, becomes agreeable with education, but various members of the allied healthcare team (PA, CM, and RN) present throughout OT session decreasing time to attempt transfer/transition and  ultimately not getting completed this session.  Transfers                 General transfer comment: NT    Balance                                           ADL either performed or assessed with clinical judgement   ADL Overall ADL's : Needs assistance/impaired                                       General ADL Comments: setup to perform UB ADLs at bed level in high fowler's position. Pt requires MOD/MAX A to perform most LB ADLs at bed level d/t pain and decreased ROM of L LE.     Vision Baseline Vision/History: Wears glasses Additional Comments: pt reports he was to schedule an eye surgery before all this started happening, but does endorse some progressively decreasing vision and glasses no longer very effective.     Perception     Praxis      Pertinent Vitals/Pain Pain Assessment: 0-10 Pain Score: 4  Pain Location: states 4 at rest, but cannot tolerate any pressure to L foot Pain Descriptors / Indicators: Aching;Sore;Throbbing Pain Intervention(s): Limited activity within patient's tolerance;Monitored during session     Hand Dominance Right   Extremity/Trunk Assessment Upper Extremity Assessment Upper  Extremity Assessment: Overall WFL for tasks assessed;Generalized weakness (ROM WFL, grossly 4-/5 B'ly)   Lower Extremity Assessment Lower Extremity Assessment: Defer to PT evaluation;LLE deficits/detail (R LE appears mostly appropriate for areas addressed, some weakness.) LLE: Unable to fully assess due to pain       Communication Communication Communication: No difficulties   Cognition Arousal/Alertness: Awake/alert Behavior During Therapy: WFL for tasks assessed/performed Overall Cognitive Status: Within Functional Limits for tasks assessed                                 General Comments: some confusion with d/c planning concepts.   General Comments       Exercises Other Exercises Other Exercises: OT  facilitates education with pt re: UB therex with green resistance band and provides handout. OT uses visual demonstration and verbal cues for form and technqiue. Pt with moderate reception, will require follow up. Other Exercises: OT facilitates education with pt re: importance of OOB mobility for PNA prevention and prevention of skin breakdown. In addition, OT educates pt re: positioning, making sure to not have bony prominences pressed agaist any surface (pt's feet touching footboard when OT presents. Pt somewhat receptive, will require continued education.   Shoulder Instructions      Home Living Family/patient expects to be discharged to:: Assisted living Living Arrangements: Alone Available Help at Discharge: Family;Available PRN/intermittently (DIL helps him some) Type of Home: Apartment Home Access: Level entry     Home Layout: One level     Bathroom Shower/Tub: Walk-in Hydrologist: Standard     Home Equipment: Environmental consultant - 4 wheels;Bedside commode;Shower seat;Grab bars - tub/shower (upright rollator)          Prior Functioning/Environment Level of Independence: Independent with assistive device(s)        Comments: was able to walk within the facility with his upright rollator, but endorses increased difficulty lately. Was showering himself using shower chair, but states that shower curtain let water spray on bathroom floor which he felt was dangerous. States he was driving and getting his own groceries and going to visit his wife that lives separately d/t significant dementia and need for extensive care. His main concern is driving.        OT Problem List: Decreased strength;Decreased range of motion;Decreased activity tolerance;Impaired balance (sitting and/or standing);Decreased knowledge of use of DME or AE;Impaired sensation;Pain      OT Treatment/Interventions: Self-care/ADL training;Therapeutic exercise;Energy conservation;DME and/or AE  instruction;Therapeutic activities;Patient/family education;Balance training    OT Goals(Current goals can be found in the care plan section) Acute Rehab OT Goals Patient Stated Goal: to go home and eventually to be able to drive again so I can go get my own groceries and visit my wife. OT Goal Formulation: With patient Time For Goal Achievement: 01/16/20 Potential to Achieve Goals: Fair  OT Frequency: Min 2X/week   Barriers to D/C:            Co-evaluation              AM-PAC OT "6 Clicks" Daily Activity     Outcome Measure Help from another person eating meals?: None Help from another person taking care of personal grooming?: A Little Help from another person toileting, which includes using toliet, bedpan, or urinal?: A Lot Help from another person bathing (including washing, rinsing, drying)?: A Lot Help from another person to put on and taking off regular upper body  clothing?: A Little Help from another person to put on and taking off regular lower body clothing?: A Lot 6 Click Score: 16   End of Session    Activity Tolerance: Patient limited by fatigue;Patient limited by pain;Other (comment) (somehwat self-limiting and therapy time limited by multidisciplinary team entering room t/o OT session time.) Patient left:    OT Visit Diagnosis: Other abnormalities of gait and mobility (R26.89);Muscle weakness (generalized) (M62.81)                Time: 6381-7711 OT Time Calculation (min): 43 min Charges:  OT General Charges $OT Visit: 1 Visit OT Evaluation $OT Eval Moderate Complexity: 1 Mod OT Treatments $Self Care/Home Management : 8-22 mins $Therapeutic Exercise: 8-22 mins  Gerrianne Scale, MS, OTR/L ascom [phone] 01/02/20, 1:43 PM

## 2020-01-02 NOTE — TOC Progression Note (Signed)
Transition of Care (TOC) - Progression Note    Patient Details  Name: [name] MRN: [id number] Date of Birth: 10/07/1940  Transition of Care (TOC) CM/SW Contact   J , RN Phone Number: 01/02/2020, 9:56 AM  Clinical Narrative:   Met with the patient along with the Surgeon's PA and OT in the room to discuss Plan [facility] [place] [place] [facility], The patient stated that he would be willing to go to a local rehab and that the location would make it easier for him to see his family, The Surgeon's PA explained that he will be here until med next week, I explained when it gets closer to a DC date we will do a bed search to see who has a bed available, I explained that his regular insurance will be what will need to cover the rehab since it is not a [facility] facility, He stated agreement and understanding. He has been fully Covid Vaccinated He does not have a preference for a local rehab facility, The surgery to amputate is scheduled for Thursday at 345 PM    Expected Discharge Plan: Skilled Nursing Facility Barriers to Discharge: Continued Medical Work up  Expected Discharge Plan and Services Expected Discharge Plan: Skilled Nursing Facility   Discharge Planning Services: CM Consult   Living arrangements for the past 2 months: Single Family Home                                       Social Determinants of Health (SDOH) Interventions    Readmission Risk Interventions No flowsheet data found.  

## 2020-01-03 ENCOUNTER — Other Ambulatory Visit (INDEPENDENT_AMBULATORY_CARE_PROVIDER_SITE_OTHER): Payer: Self-pay | Admitting: Vascular Surgery

## 2020-01-03 ENCOUNTER — Inpatient Hospital Stay: Payer: No Typology Code available for payment source | Admitting: Anesthesiology

## 2020-01-03 ENCOUNTER — Encounter: Payer: Self-pay | Admitting: Internal Medicine

## 2020-01-03 ENCOUNTER — Encounter: Admission: EM | Disposition: E | Payer: Self-pay | Source: Ambulatory Visit | Attending: Internal Medicine

## 2020-01-03 DIAGNOSIS — N1832 Chronic kidney disease, stage 3b: Secondary | ICD-10-CM | POA: Diagnosis not present

## 2020-01-03 DIAGNOSIS — L97523 Non-pressure chronic ulcer of other part of left foot with necrosis of muscle: Secondary | ICD-10-CM | POA: Diagnosis not present

## 2020-01-03 DIAGNOSIS — L97509 Non-pressure chronic ulcer of other part of unspecified foot with unspecified severity: Secondary | ICD-10-CM | POA: Diagnosis not present

## 2020-01-03 DIAGNOSIS — I96 Gangrene, not elsewhere classified: Secondary | ICD-10-CM

## 2020-01-03 DIAGNOSIS — E11621 Type 2 diabetes mellitus with foot ulcer: Secondary | ICD-10-CM | POA: Diagnosis not present

## 2020-01-03 HISTORY — PX: AMPUTATION: SHX166

## 2020-01-03 LAB — BASIC METABOLIC PANEL
Anion gap: 9 (ref 5–15)
BUN: 69 mg/dL — ABNORMAL HIGH (ref 8–23)
CO2: 21 mmol/L — ABNORMAL LOW (ref 22–32)
Calcium: 8.1 mg/dL — ABNORMAL LOW (ref 8.9–10.3)
Chloride: 102 mmol/L (ref 98–111)
Creatinine, Ser: 2.13 mg/dL — ABNORMAL HIGH (ref 0.61–1.24)
GFR calc Af Amer: 33 mL/min — ABNORMAL LOW (ref >60)
GFR calc non Af Amer: 28 mL/min — ABNORMAL LOW (ref >60)
Glucose, Bld: 99 mg/dL (ref 70–99)
Potassium: 4.4 mmol/L (ref 3.5–5.1)
Sodium: 132 mmol/L — ABNORMAL LOW (ref 135–145)

## 2020-01-03 LAB — PROTIME-INR
INR: 1.2 (ref 0.8–1.2)
Prothrombin Time: 14.8 seconds (ref 11.4–15.2)

## 2020-01-03 LAB — GLUCOSE, CAPILLARY
Glucose-Capillary: 142 mg/dL — ABNORMAL HIGH (ref 70–99)
Glucose-Capillary: 144 mg/dL — ABNORMAL HIGH (ref 70–99)
Glucose-Capillary: 176 mg/dL — ABNORMAL HIGH (ref 70–99)
Glucose-Capillary: 214 mg/dL — ABNORMAL HIGH (ref 70–99)
Glucose-Capillary: 68 mg/dL — ABNORMAL LOW (ref 70–99)
Glucose-Capillary: 70 mg/dL (ref 70–99)
Glucose-Capillary: 87 mg/dL (ref 70–99)
Glucose-Capillary: 99 mg/dL (ref 70–99)

## 2020-01-03 LAB — CBC WITH DIFFERENTIAL/PLATELET
Abs Immature Granulocytes: 0.54 10*3/uL — ABNORMAL HIGH (ref 0.00–0.07)
Basophils Absolute: 0 10*3/uL (ref 0.0–0.1)
Basophils Relative: 0 %
Eosinophils Absolute: 0 10*3/uL (ref 0.0–0.5)
Eosinophils Relative: 0 %
HCT: 30.8 % — ABNORMAL LOW (ref 39.0–52.0)
Hemoglobin: 10.2 g/dL — ABNORMAL LOW (ref 13.0–17.0)
Immature Granulocytes: 3 %
Lymphocytes Relative: 4 %
Lymphs Abs: 0.7 10*3/uL (ref 0.7–4.0)
MCH: 25.9 pg — ABNORMAL LOW (ref 26.0–34.0)
MCHC: 33.1 g/dL (ref 30.0–36.0)
MCV: 78.2 fL — ABNORMAL LOW (ref 80.0–100.0)
Monocytes Absolute: 1 10*3/uL (ref 0.1–1.0)
Monocytes Relative: 5 %
Neutro Abs: 17.5 10*3/uL — ABNORMAL HIGH (ref 1.7–7.7)
Neutrophils Relative %: 88 %
Platelets: 403 10*3/uL — ABNORMAL HIGH (ref 150–400)
RBC: 3.94 MIL/uL — ABNORMAL LOW (ref 4.22–5.81)
RDW: 15.3 % (ref 11.5–15.5)
WBC: 19.9 10*3/uL — ABNORMAL HIGH (ref 4.0–10.5)
nRBC: 0 % (ref 0.0–0.2)

## 2020-01-03 LAB — MAGNESIUM: Magnesium: 2 mg/dL (ref 1.7–2.4)

## 2020-01-03 LAB — APTT: aPTT: 45 seconds — ABNORMAL HIGH (ref 24–36)

## 2020-01-03 SURGERY — AMPUTATION BELOW KNEE
Anesthesia: General | Site: Knee | Laterality: Left

## 2020-01-03 MED ORDER — INSULIN GLARGINE 100 UNIT/ML ~~LOC~~ SOLN
10.0000 [IU] | Freq: Every day | SUBCUTANEOUS | Status: DC
  Administered 2020-01-04 – 2020-01-07 (×4): 10 [IU] via SUBCUTANEOUS
  Filled 2020-01-03 (×5): qty 0.1

## 2020-01-03 MED ORDER — LIDOCAINE HCL (PF) 2 % IJ SOLN
INTRAMUSCULAR | Status: AC
  Filled 2020-01-03: qty 5

## 2020-01-03 MED ORDER — LEVOFLOXACIN IN D5W 500 MG/100ML IV SOLN
INTRAVENOUS | Status: AC
  Filled 2020-01-03: qty 100

## 2020-01-03 MED ORDER — FENTANYL CITRATE (PF) 100 MCG/2ML IJ SOLN
INTRAMUSCULAR | Status: AC
  Administered 2020-01-03: 25 ug via INTRAVENOUS
  Filled 2020-01-03: qty 2

## 2020-01-03 MED ORDER — 0.9 % SODIUM CHLORIDE (POUR BTL) OPTIME
TOPICAL | Status: DC | PRN
  Administered 2020-01-03: 1000 mL

## 2020-01-03 MED ORDER — DEXAMETHASONE SODIUM PHOSPHATE 10 MG/ML IJ SOLN
INTRAMUSCULAR | Status: DC | PRN
  Administered 2020-01-03: 8 mg via INTRAVENOUS

## 2020-01-03 MED ORDER — HYDROMORPHONE HCL 1 MG/ML IJ SOLN
0.2500 mg | INTRAMUSCULAR | Status: DC | PRN
  Administered 2020-01-03 (×3): 0.25 mg via INTRAVENOUS

## 2020-01-03 MED ORDER — CEFAZOLIN SODIUM-DEXTROSE 2-4 GM/100ML-% IV SOLN
INTRAVENOUS | Status: AC
  Filled 2020-01-03: qty 100

## 2020-01-03 MED ORDER — DEXAMETHASONE SODIUM PHOSPHATE 10 MG/ML IJ SOLN
INTRAMUSCULAR | Status: AC
  Filled 2020-01-03: qty 1

## 2020-01-03 MED ORDER — FENTANYL CITRATE (PF) 100 MCG/2ML IJ SOLN
INTRAMUSCULAR | Status: DC | PRN
  Administered 2020-01-03 (×4): 25 ug via INTRAVENOUS

## 2020-01-03 MED ORDER — DEXTROSE 50 % IV SOLN
12.5000 g | INTRAVENOUS | Status: AC
  Administered 2020-01-03: 12.5 g via INTRAVENOUS
  Filled 2020-01-03: qty 50

## 2020-01-03 MED ORDER — HYDROMORPHONE HCL 1 MG/ML IJ SOLN
INTRAMUSCULAR | Status: AC
  Administered 2020-01-03: 0.25 mg via INTRAVENOUS
  Filled 2020-01-03: qty 1

## 2020-01-03 MED ORDER — ONDANSETRON HCL 4 MG/2ML IJ SOLN
INTRAMUSCULAR | Status: AC
  Filled 2020-01-03: qty 2

## 2020-01-03 MED ORDER — LIDOCAINE HCL (CARDIAC) PF 100 MG/5ML IV SOSY
PREFILLED_SYRINGE | INTRAVENOUS | Status: DC | PRN
  Administered 2020-01-03: 100 mg via INTRAVENOUS

## 2020-01-03 MED ORDER — ONDANSETRON HCL 4 MG/2ML IJ SOLN
INTRAMUSCULAR | Status: DC | PRN
  Administered 2020-01-03: 4 mg via INTRAVENOUS

## 2020-01-03 MED ORDER — FENTANYL CITRATE (PF) 100 MCG/2ML IJ SOLN
25.0000 ug | INTRAMUSCULAR | Status: DC | PRN
  Administered 2020-01-03 (×5): 25 ug via INTRAVENOUS

## 2020-01-03 MED ORDER — CEFTRIAXONE SODIUM 2 G IJ SOLR
INTRAMUSCULAR | Status: AC
  Filled 2020-01-03: qty 20

## 2020-01-03 MED ORDER — ONDANSETRON HCL 4 MG/2ML IJ SOLN
4.0000 mg | Freq: Once | INTRAMUSCULAR | Status: AC | PRN
  Administered 2020-01-03: 4 mg via INTRAVENOUS

## 2020-01-03 MED ORDER — DEXTROSE-NACL 5-0.9 % IV SOLN: INTRAVENOUS | Status: DC

## 2020-01-03 MED ORDER — ARTIFICIAL TEARS OPHTHALMIC OINT
TOPICAL_OINTMENT | OPHTHALMIC | Status: AC
  Filled 2020-01-03: qty 3.5

## 2020-01-03 MED ORDER — ACETAMINOPHEN 10 MG/ML IV SOLN
INTRAVENOUS | Status: DC | PRN
  Administered 2020-01-03: 1000 mg via INTRAVENOUS

## 2020-01-03 MED ORDER — FENTANYL CITRATE (PF) 100 MCG/2ML IJ SOLN
INTRAMUSCULAR | Status: AC
  Filled 2020-01-03: qty 2

## 2020-01-03 MED ORDER — ACETAMINOPHEN 10 MG/ML IV SOLN
INTRAVENOUS | Status: AC
  Filled 2020-01-03: qty 100

## 2020-01-03 MED ORDER — PROPOFOL 10 MG/ML IV BOLUS
INTRAVENOUS | Status: AC
  Filled 2020-01-03: qty 20

## 2020-01-03 MED ORDER — PHENYLEPHRINE HCL (PRESSORS) 10 MG/ML IV SOLN
INTRAVENOUS | Status: DC | PRN
  Administered 2020-01-03 (×3): 100 ug via INTRAVENOUS

## 2020-01-03 MED ORDER — PROPOFOL 10 MG/ML IV BOLUS
INTRAVENOUS | Status: DC | PRN
  Administered 2020-01-03: 150 mg via INTRAVENOUS

## 2020-01-03 SURGICAL SUPPLY — 41 items
BLADE SAGITTAL WIDE XTHICK NO (BLADE) IMPLANT
BLADE SAW SAG 25.4X90 (BLADE) ×3 IMPLANT
BNDG CMPR STD VLCR NS LF 5.8X6 (GAUZE/BANDAGES/DRESSINGS) ×1
BNDG COHESIVE 4X5 TAN STRL (GAUZE/BANDAGES/DRESSINGS) ×3 IMPLANT
BNDG ELASTIC 6X5.8 VLCR NS LF (GAUZE/BANDAGES/DRESSINGS) ×3 IMPLANT
BNDG GAUZE 4.5X4.1 6PLY STRL (MISCELLANEOUS) ×6 IMPLANT
BRUSH SCRUB EZ  4% CHG (MISCELLANEOUS) ×3
BRUSH SCRUB EZ 4% CHG (MISCELLANEOUS) ×1 IMPLANT
CANISTER SUCT 1200ML W/VALVE (MISCELLANEOUS) ×3 IMPLANT
CHLORAPREP W/TINT 26ML (MISCELLANEOUS) ×3 IMPLANT
COVER WAND RF STERILE (DRAPES) ×3 IMPLANT
DRAIN PENROSE 1/4X12 LTX STRL (WOUND CARE) IMPLANT
DRAPE INCISE IOBAN 66X45 STRL (DRAPES) ×2 IMPLANT
DRSG TEGADERM 4X4.75 (GAUZE/BANDAGES/DRESSINGS) ×2 IMPLANT
ELECT CAUTERY BLADE 6.4 (BLADE) ×3 IMPLANT
ELECT REM PT RETURN 9FT ADLT (ELECTROSURGICAL) ×3
ELECTRODE REM PT RTRN 9FT ADLT (ELECTROSURGICAL) ×1 IMPLANT
GAUZE XEROFORM 1X8 LF (GAUZE/BANDAGES/DRESSINGS) ×6 IMPLANT
GLOVE BIO SURGEON STRL SZ7 (GLOVE) ×6 IMPLANT
GLOVE INDICATOR 7.5 STRL GRN (GLOVE) ×3 IMPLANT
GOWN STRL REUS W/ TWL LRG LVL3 (GOWN DISPOSABLE) ×2 IMPLANT
GOWN STRL REUS W/ TWL XL LVL3 (GOWN DISPOSABLE) ×1 IMPLANT
GOWN STRL REUS W/TWL LRG LVL3 (GOWN DISPOSABLE) ×6
GOWN STRL REUS W/TWL XL LVL3 (GOWN DISPOSABLE) ×3
HANDLE YANKAUER SUCT BULB TIP (MISCELLANEOUS) ×3 IMPLANT
KIT TURNOVER KIT A (KITS) ×3 IMPLANT
LABEL OR SOLS (LABEL) ×3 IMPLANT
NS IRRIG 1000ML POUR BTL (IV SOLUTION) ×3 IMPLANT
PACK EXTREMITY (MISCELLANEOUS) ×3 IMPLANT
PAD ABD DERMACEA PRESS 5X9 (GAUZE/BANDAGES/DRESSINGS) ×6 IMPLANT
PAD PREP 24X41 OB/GYN DISP (PERSONAL CARE ITEMS) ×3 IMPLANT
SPONGE LAP 18X18 RF (DISPOSABLE) ×7 IMPLANT
STAPLER SKIN PROX 35W (STAPLE) ×3 IMPLANT
STOCKINETTE M/LG 89821 (MISCELLANEOUS) ×3 IMPLANT
SUT SILK 2 0 (SUTURE) ×3
SUT SILK 2 0 SH (SUTURE) ×6 IMPLANT
SUT SILK 2-0 18XBRD TIE 12 (SUTURE) ×1 IMPLANT
SUT SILK 3 0 (SUTURE) ×3
SUT SILK 3-0 18XBRD TIE 12 (SUTURE) ×1 IMPLANT
SUT VIC AB 0 CT1 36 (SUTURE) ×8 IMPLANT
SUT VIC AB 2-0 CT1 (SUTURE) ×6 IMPLANT

## 2020-01-03 NOTE — H&P (Signed)
[facility] History & Physical Update  The patient was interviewed and re-examined.  The patient's previous History and Physical has been reviewed and is unchanged.  There is no change in the plan of care. We plan to proceed with the scheduled procedure.  Leotis Pain, MD  01/09/2020, 10:00 AM

## 2020-01-03 NOTE — Anesthesia Procedure Notes (Signed)
Procedure Name: LMA Insertion Date/Time: 12/21/2019 10:24 AM Performed by: Eben Burow, CRNA Pre-anesthesia Checklist: Emergency Drugs available, Patient identified, Suction available and Patient being monitored Patient Re-evaluated:Patient Re-evaluated prior to induction Oxygen Delivery Method: Circle system utilized Preoxygenation: Pre-oxygenation with 100% oxygen Induction Type: IV induction Ventilation: Mask ventilation without difficulty LMA: LMA inserted LMA Size: 5.0 Number of attempts: 1 Placement Confirmation: positive ETCO2 and breath sounds checked- equal and bilateral Tube secured with: Tape Dental Injury: Teeth and Oropharynx as per pre-operative assessment

## 2020-01-03 NOTE — Anesthesia Postprocedure Evaluation (Signed)
Anesthesia Post Note  Patient: [name]  Procedure(s) Performed: LEFT BELOW KNEE AMPUTATION (Left Knee)  Patient location during evaluation: PACU Anesthesia Type: General Level of consciousness: awake and alert Pain management: pain level controlled Vital Signs Assessment: post-procedure vital signs reviewed and stable Respiratory status: spontaneous breathing and respiratory function stable Cardiovascular status: stable Anesthetic complications: no   No complications documented.   Last Vitals:  Vitals:   01/07/2020 1145 01/01/2020 1200  BP: (!) 149/79   Pulse: (!) 104   Resp: 15 14  Temp: (!) 36.3 C   SpO2: 100%     Last Pain:  Vitals:   01/09/2020 1211  TempSrc:   PainSc: Asleep                 Rikayla Demmon K

## 2020-01-03 NOTE — Transfer of Care (Signed)
Immediate Anesthesia Transfer of Care Note  Patient: [name]  Procedure(s) Performed: LEFT BELOW KNEE AMPUTATION (Left Knee)  Patient Location: PACU  Anesthesia Type:General  Level of Consciousness: drowsy and patient cooperative  Airway & Oxygen Therapy: Patient Spontanous Breathing  Post-op Assessment: Report given to RN and Post -op Vital signs reviewed and stable  Post vital signs: Reviewed and stable  Last Vitals:  Vitals Value Taken Time  BP 149/79 01/09/2020 1145  Temp    Pulse 103 01/10/2020 1151  Resp 20 12/28/2019 1151  SpO2 98 % 12/31/2019 1151  Vitals shown include unvalidated device data.  Last Pain:  Vitals:   01/01/2020 0935  TempSrc: Temporal  PainSc:       Patients Stated Pain Goal: 0 (08/01/13 5208)  Complications: No complications documented.

## 2020-01-03 NOTE — OR Nursing (Signed)
ALL CHARTING DONE AFTER 11:05 WAS PERFORMED BY AMANDA HELTON, RELIE[facility].

## 2020-01-03 NOTE — Progress Notes (Signed)
Pt in PACU c/o nausea. Zofran given. Pt reports nausea improved after antiemetic and states nausea will improve with jello and/or pudding. He is stable and ready to return to his room.

## 2020-01-03 NOTE — Progress Notes (Signed)
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 Patient presented from podiatry office with complaints of significantly increased pain in left foot over the last 4-5 days.  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  Patient noted still sleepy this afternoon, diet advanced to CM. Per flowsheets, pt with varied po intake during admission, eating 0-100% (47% average) of 6 documented meals, noted episodes of N/V as well as constipation have improved. Will continue to monitor po intake and provide additional supplements as appropriate. RD ordered Ensure Max daily and Juven BID on 7/09, per medication history, pt has been consuming throughout admission, noted one refusal of supplements on 7/13.   No new weights this admission, recommend obtaining new weight as able to fully assess needs s/p BKA.  I/Os: -2640 ml since admit    +805 ml x 24 hrs UOP: 1625 ml x 24  hrs Medications reviewed and include: Gabapentin, SSI, Lantus 10 units daily, Zofran, Protonix, Miralax, Senokot IVF: D5 NaCl @ 75 ml/hr IVPB: Rocephin, Ancef, Flagyl, Vancomycin  Labs: CBGs 142, 144 Na 132 (L), BUN 69 (H), Cr 2.13 (H), WBC 19.9 (H)   Diet Order:   Diet Order            Diet Carb Modified Fluid consistency: Thin; Room service appropriate? Yes  Diet effective now                 EDUCATION NEEDS:   No education needs have been identified at this time  Skin:  Skin Assessment: Skin Integrity Issues: Skin Integrity Issues:: Diabetic Ulcer, Other (Comment), Incisions, Stage I Stage I: Medial buttocks (7/10) Diabetic Ulcer: left foot Incisions: Closed; L leg (7/15) Other: ecchymosis;bilateral arm  Last BM:  7/14 type 6  Height:   Ht Readings from Last 1 Encounters:  12/27/19 6\' 2"  (1.88 m)    Weight:   Wt Readings from Last 1 Encounters:  12/30/2019 75.8 kg    Ideal Body Weight:  86.4 kg  BMI:  Body mass index is 21.44 kg/m.  Estimated Nutritional Needs:   Kcal:  6203-5597  Protein:  110-120  Fluid:  1.9 L   Lajuan Lines, RD, LDN Clinical Nutrition After Hours/Weekend Pager # in [place]

## 2020-01-03 NOTE — Op Note (Signed)
   OPERATIVE NOTE   PROCEDURE: Left below-the-knee amputation  PRE-OPERATIVE DIAGNOSIS: Left foot and ankle wound and infection  POST-OPERATIVE DIAGNOSIS: same as above  SURGEON: Leotis Pain, MD  ASSISTANT(S): Hezzie Bump, PA-C  ANESTHESIA: general  ESTIMATED BLOOD LOSS: 200 cc  FINDING(S): none  SPECIMEN(S):  Left below-the-knee amputation  INDICATIONS:   [name] is a 79 y.o. male who presents with left lower leg and foot infection and ulceration deemed nonsalvageable by podiatry.  The patient is scheduled for a left below-the-knee amputation.  I discussed in depth with the patient the risks, benefits, and alternatives to this procedure.  The patient is aware that the risk of this operation included but are not limited to:  bleeding, infection, myocardial infarction, stroke, death, failure to heal amputation wound, and possible need for more proximal amputation.  The patient is aware of the risks and agrees proceed forward with the procedure.  DESCRIPTION:  After full informed written consent was obtained from the patient, the patient was brought back to the operating room, and placed supine upon the operating table.  Prior to induction, the patient received IV antibiotics.  The patient was then prepped and draped in the standard fashion for a below-the-knee amputation.  After obtaining adequate anesthesia, the patient was prepped and draped in the standard fashion for a left below-the-knee amputation.  I marked out the anterior incision two finger breadths below the tibial tuberosity and then the marked out a posterior flap that was one third of the circumference of the calf in length.   I made the incisions for these flaps, and then dissected through the subcutaneous tissue, fascia, and muscle anteriorly.  I elevated  the periosteal tissue superiorly so that the tibia was about 3-4 cm shorter than the anterior skin flap.  I then transected the tibia with a power saw and then  took a wedge off the tibia anteriorly with the power saw.  Then I smoothed out the rough edges.  In a similar fashion, I cut back the fibula about two centimeters higher than the level of the tibia with a bone cutter.  I put a bone hook into the distal tibia and then used a large amputation knife to sharply develop a tissue plane through the muscle along the fibula.  In such fashion, the posterior flap was developed.  At this point, the specimen was passed off the field as the below-the-knee amputation.  At this point, I clamped all visibly bleeding arteries and veins using a combination of suture ligation with Silk suture and electrocautery.  Bleeding continued to be controlled with electrocautery and suture ligature.  The stump was washed off with sterile normal saline and no further active bleeding was noted.  I reapproximated the anterior and posterior fascia  with interrupted stitches of 0 Vicryl.  This was completed along the entire length of anterior and posterior fascia until there were no more loose space in the fascial line. I then placed a layer of 2-0 Vicryl sutures in the subcutaneous tissue. The skin was then  reapproximated with staples.  The stump was washed off and dried.  The incision was dressed with Xeroform and  then fluffs were applied.  Kerlix was wrapped around the leg and then gently an ACE wrap was applied.    COMPLICATIONS: none  CONDITION: stable   Leotis Pain  01/19/2020, 11:34 AM    This note was created with Dragon Medical transcription system. Any errors in dictation are purely unintentional.

## 2020-01-03 NOTE — Progress Notes (Signed)
Ch visited with Pt in response to PG for prayer. Pt reported to Ch that his surgery time was advanced due to some cancellation. Pt seemed anxious about amputation and requested prayer. Ch inquired about family support during this time, and prayed with Pt. Pt was tearful at the end of prayer. Pt reported to Ch that he did not want to be in any pain when he is out of surgery. Pt shared with Ch that any amount of medication could be used so he does not have to feel pain. Pt kept muttering " I won't be alone, I can do it" "The good Reita Cliche will do it" several times. Ch said that she will check up on him after surgery  02:30 p.m.  Ch saw Pt being wheeled into room in the hallway. Ch met with RN and updated her about morning visit, and asked RN to PG Ch if Pt requires support.

## 2020-01-03 NOTE — Progress Notes (Signed)
PROGRESS NOTE    [name]  TJJ:[id number] DOB: 08/19/1940 DOA: 01/01/2020 PCP: Smitty Cords, DO   Chief complaint.  Leg pain. 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 Patient was started on IV vancomycin, Rocephin and Flagyl, podiatry and vascular surgeon were consulted.  Patient had a angiogram and revascularization on 7/12.  But the foot gangrene does not seem to be improving, as a result, planning for below-knee amputation.  7/15.  Had a left below-knee amputation performed today.   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 Status post below-knee amputation.  Patient still pretty sleepy after surgery.  Otherwise he is doing well.  Continue antibiotics for the next 5 days.  2.  Constipation with nausea vomiting. Condition has improved.  3.  Chronic kidney disease stage IIIb. Recheck renal function tomorrow.  4.  Iron deficient anemia.  Status post IV iron.  5 essential hypertension. Continue Norvasc.  6.  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  Follow.  DVT prophylaxis: Heparin Code Status: Full Family Communication: None at bedside Disposition Plan:   Patient came from:  Home                                                                                                                           Anticipated d/c place:Possible SNF  Barriers to d/c OR conditions which need to be met to effect a safe d/c:   Consultants:   Podiatry  Vascular surgery  Procedures: SP lower extremity angiogram,  BKA. Antimicrobials:  Vancomycin, Rocephin and Flagyl.    Subjective: Seen patient after below-knee amputation.  Has some sleepiness today, otherwise doing well.  Some nausea, no vomiting.  No fever or chills.  Objective: Vitals:   01/12/2020 1200 01/12/2020 1329 01/12/2020 1352 12/29/2019 1402  BP:    138/69  Pulse:    90  Resp: 14   15  Temp:    (!) 97.5 F (36.4 C)  TempSrc:      SpO2:  95% 100% 97%  Weight:      Height:        Intake/Output Summary (Last 24 hours) at 12/21/2019 1436 Last data filed at 01/07/2020 1406 Gross per 24 hour  Intake 3460 ml  Output 1825 ml  Net 1635  ml   Filed Weights   01/04/2020 1533  Weight: 75.8 kg    Examination:  General exam: Appears calm and comfortable  Respiratory system: Clear to auscultation. Respiratory effort normal. Cardiovascular system: S1 & S2 heard, RRR. No JVD, murmurs, rubs, gallops or clicks. No pedal edema. Gastrointestinal system: Abdomen is nondistended, soft and nontender. No organomegaly or masses felt. Normal bowel sounds heard. Central nervous system: Alert and oriented. No focal neurological deficits. Extremities: No edema, left BKA. Skin: No rashes, lesions or ulcers Psychiatry: Judgement and insight appear normal. Mood & affect appropriate.     Data Reviewed: I have personally reviewed following labs and imaging studies  CBC: Recent Labs  Lab 12/28/19 0518 12/29/19 0608 12/30/19 0432 12/20/2019 0407  WBC 15.3* 15.4* 12.0* 19.9*  NEUTROABS  --   --   --  17.5*  HGB 10.3* 10.3* 10.6* 10.2*  HCT 30.1* 30.6* 32.1* 30.8*  MCV 78.4* 78.3* 78.7* 78.2*  PLT 261 279 316 403*    Basic Metabolic Panel: Recent Labs  Lab 12/28/19 0518 12/28/19 0518 12/29/19 0102 12/29/19 7253 12/30/19 0432 01/14/2020 0523 12/30/2019 0942 01/02/20 0733 01/12/2020 0407  NA 130*  --  129*  --  130*  --   --   --  132*  K 4.2  --  4.8  --  4.9  --   --   --  4.4  CL 100  --  98  --  99  --   --   --  102  CO2 21*  --  24  --  22  --   --   --  21*  GLUCOSE 239*  --  178*  --  158*  --   --   --  99  BUN 47*  --  53*  --  55*  --  60*  --  69*  CREATININE 2.01*   < > 2.18*   < > 2.12* 2.01* 2.16* 2.21* 2.13*  CALCIUM 8.1*  --  8.3*  --  8.5*  --   --   --  8.1*  MG 1.3*  --  2.2  --  2.0  --   --   --  2.0   < > = values in this interval not displayed.   GFR: Estimated Creatinine Clearance: 29.2 mL/min (A) (by C-G formula based on SCr of 2.13 mg/dL (H)). Liver Function Tests: No results for input(s): AST, ALT, ALKPHOS, BILITOT, PROT, ALBUMIN in the last 168 hours. No results for input(s): LIPASE, AMYLASE in the last 168 hours. No results for input(s): AMMONIA in the last 168 hours. Coagulation Profile: Recent Labs  Lab 12/22/2019 0407  INR 1.2   Cardiac Enzymes: No results for input(s): CKTOTAL, CKMB, CKMBINDEX, TROPONINI in the last 168 hours. BNP (last 3 results) No results for input(s): PROBNP in the last 8760 hours. HbA1C: No results for input(s): HGBA1C in the last 72 hours. CBG: Recent Labs  Lab 01/05/2020 0125 01/12/2020 0159 12/24/2019 0407 12/30/2019 0938 01/02/2020 1150  GLUCAP 70 99 87 144* 142*   Lipid Profile: No results for input(s): CHOL, HDL, LDLCALC, TRIG, CHOLHDL, LDLDIRECT in the last 72 hours. Thyroid Function Tests: No results for input(s): TSH, T4TOTAL, FREET4, T3FREE, THYROIDAB in the last 72 hours. Anemia Panel: No results for input(s): VITAMINB12, FOLATE, FERRITIN, TIBC, IRON, RETICCTPCT in the last 72 hours. Sepsis Labs: No results for input(s): PROCALCITON, LATICACIDVEN in the last 168 hours.  Recent Results (from the past 240 hour(s))  Blood  Cultures x 2 sites     Status: None   Collection Time: 01/19/2020  8:48 PM   Specimen: BLOOD  Result Value Ref Range Status   Specimen Description BLOOD BLOOD LEFT FOREARM  Final   Special Requests   Final    BOTTLES DRAWN AEROBIC AND ANAEROBIC Blood Culture adequate volume   Culture   Final    NO GROWTH 5 DAYS Performed at [facility], [address] [place], [place] [address]    Report Status 01/19/2020 FINAL  Final  Culture, blood (Routine X 2) w Reflex to ID Panel     Status: None   Collection Time: 12/27/19  7:01 PM   Specimen: BLOOD  Result Value Ref Range Status   Specimen Description BLOOD BLOOD RIGHT HAND  Final   Special Requests   Final    BOTTLES DRAWN AEROBIC AND ANAEROBIC Blood Culture adequate volume   Culture   Final    NO GROWTH 5 DAYS Performed at [facility], [address] [place], [place] [address]    Report Status 01/05/2020 FINAL  Final  SARS Coronavirus 2 by RT PCR (hospital order, performed in [facility] hospital lab) Nasopharyngeal Nasopharyngeal Swab     Status: None   Collection Time: 12/27/19  8:45 PM   Specimen: Nasopharyngeal Swab  Result Value Ref Range Status   SARS Coronavirus 2 NEGATIVE NEGATIVE Final    Comment: (NOTE) SARS-CoV-2 target nucleic acids are NOT DETECTED.  The SARS-CoV-2 RNA is generally detectable in upper and lower respiratory specimens during the acute phase of infection. The lowest concentration of SARS-CoV-2 viral copies this assay can detect is 250 copies / mL. A negative result does not preclude SARS-CoV-2 infection and should not be used as the sole basis for treatment or other patient management decisions.  A negative result may occur with improper specimen collection / handling, submission of specimen other than nasopharyngeal swab, presence of viral mutation(s) within the areas targeted by this assay, and inadequate number of viral copies (<250 copies / mL). A negative result must be combined  with clinical observations, patient history, and epidemiological information.  Fact Sheet for Patients:   StrictlyIdeas.no  Fact Sheet for Healthcare Providers: BankingDealers.co.za  This test is not yet approved or  cleared by the [place] FDA and has been authorized for detection and/or diagnosis of SARS-CoV-2 by FDA under an Emergency Use Authorization (EUA).  This EUA will remain in effect (meaning this test can be used) for the duration of the COVID-19 declaration under Section 564(b)(1) of the Act, 21 U.S.C. section 360bbb-3(b)(1), unless the authorization is terminated or revoked sooner.  Performed at [facility], [address] [place], [place] [address]   MRSA PCR Screening     Status: None   Collection Time: 12/27/19  8:45 PM   Specimen: Nasopharyngeal  Result Value Ref Range Status   MRSA by PCR NEGATIVE NEGATIVE Final    Comment:        The GeneXpert MRSA Assay (FDA approved for NASAL specimens only), is one component of a comprehensive MRSA colonization surveillance program. It is not intended to diagnose MRSA infection nor to guide or monitor treatment for MRSA infections. Performed at [facility], [address] [place], [place] [address]          Radiology Studies: No results found.      Scheduled Meds: . amLODipine  10 mg Oral Daily  . aspirin EC  81 mg Oral Daily  .  Chlorhexidine Gluconate Cloth  6 each Topical Daily  . fluticasone  2 spray Each Nare Daily  . gabapentin  100 mg Oral QHS  . heparin  5,000 Units Subcutaneous Q8H  . insulin aspart  0-15 Units Subcutaneous TID WC  . insulin aspart  10 Units Subcutaneous TID WC  . insulin glargine  10 Units Subcutaneous Daily  . lidocaine  1 patch Transdermal Q24H  . loratadine  10 mg Oral Daily  . multivitamin with minerals  1 tablet Oral Daily  . nutrition supplement (JUVEN)  1 packet Oral BID BM  . ondansetron       . pantoprazole  40 mg Oral Daily  . polyethylene glycol  17 g Oral BID  . Ensure Max Protein  11 oz Oral Daily  . senna-docusate  2 tablet Oral BID  . simvastatin  10 mg Oral QHS  . sodium chloride flush  3 mL Intravenous Once  . tamsulosin  0.4 mg Oral Daily   Continuous Infusions: . sodium chloride    . ceFAZolin    . cefTRIAXone (ROCEPHIN)  IV 2 g (01/02/20 2059)  . dextrose 5 % and 0.9% NaCl 75 mL/hr at 01/06/2020 0155  . levofloxacin    . metronidazole 500 mg (01/15/2020 0027)  . vancomycin 750 mg (01/02/20 1715)     LOS: 8 days    Time spent: 28 minutes    Sharen Hones, MD [facility]   To contact the attending provider between 7A-7P or the covering provider during after hours 7P-7A, please log into the web site [email or web address] and access using universal [place] password for that web site. If you do not have the password, please call the hospital operator.  01/19/2020, 2:36 PM

## 2020-01-03 NOTE — Progress Notes (Signed)
Inpatient Diabetes Program Recommendations  AACE/ADA: New Consensus Statement on Inpatient Glycemic Control   Target Ranges:  Prepandial:   less than 140 mg/dL      Peak postprandial:   less than 180 mg/dL (1-2 hours)      Critically ill patients:  140 - 180 mg/dL   Results for [name], [name] (MRN [id number]) as of 01/12/2020 08:35  Ref. Range 01/05/2020 00:46 12/24/2019 01:25 12/25/2019 01:59 12/21/2019 04:07  Glucose-Capillary Latest Ref Range: 70 - 99 mg/dL 68 (L) 70 99 87  Results for [name], [name] (MRN [id number]) as of 12/26/2019 08:35  Ref. Range 01/02/2020 09:20 01/02/2020 10:05 01/02/2020 11:56 01/02/2020 16:36 01/02/2020 21:00 01/02/2020 21:47 01/02/2020 22:53  Glucose-Capillary Latest Ref Range: 70 - 99 mg/dL 120 (H)   Lantus 30 units @9 :54   Novolog 10 units 160 (H)  Novolog 13 units @12 :18 101 (H)  Novolog 10 units @17 :13 61 (L) 64 (L) 68 (L)   Review of Glycemic Control  Diabetes history: DM2 Outpatient Diabetes medications: Lantus 18 units daily Current orders for Inpatient glycemic control: Lantus 30 units daily, Novolog 10 units TID with meals for meal coverage, Novolog 0-15 units TID with meals  Inpatient Diabetes Program Recommendations:    Insulin-Basal: Please consider decreasing Lantus to 25 units daily.  Thanks, Barnie Alderman, RN, MSN, CDE Diabetes Coordinator Inpatient Diabetes Program [phone] (Team Pager from 8am to 5pm)

## 2020-01-03 NOTE — Anesthesia Preprocedure Evaluation (Signed)
Anesthesia Evaluation  Patient identified by MRN, date of birth, ID band Patient awake    Reviewed: Allergy & Precautions, NPO status , Patient's Chart, lab work & pertinent test results  History of Anesthesia Complications Negative for: history of anesthetic complications  Airway Mallampati: II       Dental   Pulmonary neg sleep apnea, neg COPD, Not current smoker, former smoker,           Cardiovascular hypertension, Pt. on medications +CHF  (-) Past MI (-) dysrhythmias (-) Valvular Problems/Murmurs     Neuro/Psych neg Seizures    GI/Hepatic Neg liver ROS, GERD  Medicated and Controlled,  Endo/Other  diabetes, Type 2, Oral Hypoglycemic Agents  Renal/GU Renal InsufficiencyRenal disease     Musculoskeletal   Abdominal   Peds  Hematology   Anesthesia Other Findings   Reproductive/Obstetrics                             Anesthesia Physical Anesthesia Plan  ASA: III  Anesthesia Plan: General   Post-op Pain Management:    Induction: Intravenous  PONV Risk Score and Plan: 2 and Ondansetron and Dexamethasone  Airway Management Planned: LMA  Additional Equipment:   Intra-op Plan:   Post-operative Plan:   Informed Consent: I have reviewed the patients History and Physical, chart, labs and discussed the procedure including the risks, benefits and alternatives for the proposed anesthesia with the patient or authorized representative who has indicated his/her understanding and acceptance.       Plan Discussed with:   Anesthesia Plan Comments:         Anesthesia Quick Evaluation

## 2020-01-03 NOTE — Progress Notes (Signed)
Report given to Eve RN

## 2020-01-04 ENCOUNTER — Encounter: Payer: Self-pay | Admitting: Vascular Surgery

## 2020-01-04 DIAGNOSIS — N1832 Chronic kidney disease, stage 3b: Secondary | ICD-10-CM | POA: Diagnosis not present

## 2020-01-04 DIAGNOSIS — L97523 Non-pressure chronic ulcer of other part of left foot with necrosis of muscle: Secondary | ICD-10-CM | POA: Diagnosis not present

## 2020-01-04 LAB — TYPE AND SCREEN
ABO/RH(D): A POS
Antibody Screen: NEGATIVE
Unit division: 0
Unit division: 0

## 2020-01-04 LAB — BASIC METABOLIC PANEL
Anion gap: 6 (ref 5–15)
BUN: 66 mg/dL — ABNORMAL HIGH (ref 8–23)
CO2: 20 mmol/L — ABNORMAL LOW (ref 22–32)
Calcium: 7.7 mg/dL — ABNORMAL LOW (ref 8.9–10.3)
Chloride: 105 mmol/L (ref 98–111)
Creatinine, Ser: 2.05 mg/dL — ABNORMAL HIGH (ref 0.61–1.24)
GFR calc Af Amer: 34 mL/min — ABNORMAL LOW (ref >60)
GFR calc non Af Amer: 30 mL/min — ABNORMAL LOW (ref >60)
Glucose, Bld: 210 mg/dL — ABNORMAL HIGH (ref 70–99)
Potassium: 5.1 mmol/L (ref 3.5–5.1)
Sodium: 131 mmol/L — ABNORMAL LOW (ref 135–145)

## 2020-01-04 LAB — BPAM RBC
Blood Product Expiration Date: 202108162359
Blood Product Expiration Date: 202108162359
Unit Type and Rh: 6200
Unit Type and Rh: 6200

## 2020-01-04 LAB — GLUCOSE, CAPILLARY
Glucose-Capillary: 137 mg/dL — ABNORMAL HIGH (ref 70–99)
Glucose-Capillary: 247 mg/dL — ABNORMAL HIGH (ref 70–99)
Glucose-Capillary: 197 mg/dL — ABNORMAL HIGH (ref 70–99)
Glucose-Capillary: 204 mg/dL — ABNORMAL HIGH (ref 70–99)

## 2020-01-04 LAB — CBC WITH DIFFERENTIAL/PLATELET
Abs Immature Granulocytes: 0.92 10*3/uL — ABNORMAL HIGH (ref 0.00–0.07)
Basophils Absolute: 0.1 10*3/uL (ref 0.0–0.1)
Basophils Relative: 0 %
Eosinophils Absolute: 0 10*3/uL (ref 0.0–0.5)
Eosinophils Relative: 0 %
HCT: 25.2 % — ABNORMAL LOW (ref 39.0–52.0)
Hemoglobin: 8.3 g/dL — ABNORMAL LOW (ref 13.0–17.0)
Immature Granulocytes: 5 %
Lymphocytes Relative: 6 %
Lymphs Abs: 1.1 10*3/uL (ref 0.7–4.0)
MCH: 26.1 pg (ref 26.0–34.0)
MCHC: 32.9 g/dL (ref 30.0–36.0)
MCV: 79.2 fL — ABNORMAL LOW (ref 80.0–100.0)
Monocytes Absolute: 0.8 10*3/uL (ref 0.1–1.0)
Monocytes Relative: 4 %
Neutro Abs: 15.5 10*3/uL — ABNORMAL HIGH (ref 1.7–7.7)
Neutrophils Relative %: 85 %
Platelets: 411 10*3/uL — ABNORMAL HIGH (ref 150–400)
RBC: 3.18 MIL/uL — ABNORMAL LOW (ref 4.22–5.81)
RDW: 15.6 % — ABNORMAL HIGH (ref 11.5–15.5)
WBC: 18.3 10*3/uL — ABNORMAL HIGH (ref 4.0–10.5)
nRBC: 0 % (ref 0.0–0.2)

## 2020-01-04 LAB — PREPARE RBC (CROSSMATCH)

## 2020-01-04 LAB — MAGNESIUM: Magnesium: 2 mg/dL (ref 1.7–2.4)

## 2020-01-04 MED ORDER — FERROUS SULFATE 325 (65 FE) MG PO TABS
325.0000 mg | ORAL_TABLET | Freq: Every day | ORAL | Status: DC
  Administered 2020-01-05 – 2020-01-14 (×7): 325 mg via ORAL
  Filled 2020-01-04 (×8): qty 1

## 2020-01-04 MED ORDER — SODIUM CHLORIDE 0.9 % IV SOLN
3.0000 g | Freq: Two times a day (BID) | INTRAVENOUS | Status: DC
  Administered 2020-01-04 – 2020-01-05 (×3): 3 g via INTRAVENOUS
  Filled 2020-01-04 (×2): qty 3
  Filled 2020-01-04: qty 8
  Filled 2020-01-04: qty 3

## 2020-01-04 NOTE — Progress Notes (Signed)
   01/04/20 1045  Clinical Encounter Type  Visited With Patient  Visit Type Follow-up;Spiritual support;Social support  Referral From Chaplain  Consult/Referral To Chaplain  Ch followed-with Pt per request from night Ch. Pt was in good spirits. Pt said that he feels a lot better than he did yesterday. PT said he has a new outlook on life, and he is going to do everything he can to get back to his quality of life.  Ch prayed for Pt and will follow-up with Pt.

## 2020-01-04 NOTE — Progress Notes (Signed)
Inpatient Diabetes Program Recommendations  AACE/ADA: New Consensus Statement on Inpatient Glycemic Control   Target Ranges:  Prepandial:   less than 140 mg/dL      Peak postprandial:   less than 180 mg/dL (1-2 hours)      Critically ill patients:  140 - 180 mg/dL  Results for [name], [name] (MRN [id number]) as of 01/04/2020 07:54  Ref. Range 01/04/2020 03:46  Glucose Latest Ref Range: 70 - 99 mg/dL 210 (H)   Results for [name], [name] (MRN [id number]) as of 01/04/2020 07:54  Ref. Range 01/15/2020 00:46 12/25/2019 01:25 12/27/2019 01:59 12/29/2019 04:07 01/09/2020 09:38 01/16/2020 11:50 01/11/2020 16:40 01/02/2020 20:43  Glucose-Capillary Latest Ref Range: 70 - 99 mg/dL 68 (L) 70 99 87 144 (H) 142 (H) 214 (H) 176 (H)   Review of Glycemic Control  Diabetes history:DM2 Outpatient Diabetes medications:Lantus 18 units daily Current orders for Inpatient glycemic control:Lantus 10 units daily, Novolog 10 units TID with meals for meal coverage, Novolog 0-15 units TID with meals  Inpatient Diabetes Program Recommendations:    Insulin-Basal: Patient received Lantus 30 units on 01/02/20.  No Lantus was given on 01/02/2020 and patient received Decadron 8 mg x 1 on 12/28/2019. RN reports finger stick glucose 226 mg/dl this morning and patient was given Lantus 10 units this morning.  Please consider increasing Lantus to 25 units daily to start 01/05/20. If MD agreeable, please also order Lantus 15 units x1 now (for total of 25 units today).  Thanks, Barnie Alderman, RN, MSN, CDE Diabetes Coordinator Inpatient Diabetes Program [phone] (Team Pager from 8am to 5pm)

## 2020-01-04 NOTE — Progress Notes (Signed)
[facility] Daily Progress Note  Subjective: 01/05/2020: Left below-the-knee amputation  Patient notes improvement in his left lower extremity pain.  No issues overnight.  Working with physical therapy this AM.  Objective: Vitals:   01/08/2020 1922 01/04/20 0050 01/04/20 0533 01/04/20 0746  BP: 132/74 135/70 140/75 130/76  Pulse: 90 87 96 89  Resp: 15 20 20 16   Temp: 98.2 F (36.8 C) 98.8 F (37.1 C) 98 F (36.7 C) 98.1 F (36.7 C)  TempSrc: Oral Oral Oral Oral  SpO2: 100% 98% 100% 97%  Weight:      Height:        Intake/Output Summary (Last 24 hours) at 01/04/2020 1059 Last data filed at 01/04/2020 0945 Gross per 24 hour  Intake 3366.02 ml  Output 1300 ml  Net 2066.02 ml   Physical Exam: A&Ox3, NAD CV: RRR Pulmonary: CTA Bilaterally Abdomen: Soft, Nontender, Nondistended Vascular:  Left lower extremity: Thigh soft.  Knee flexed while the joint.  Or dressing is clean dry and intact.   Laboratory: CBC    Component Value Date/Time   WBC 18.3 (H) 01/04/2020 0346   HGB 8.3 (L) 01/04/2020 0346   HGB 13.0 08/15/2013 0505   HCT 25.2 (L) 01/04/2020 0346   HCT 37.6 (L) 08/15/2013 0505   PLT 411 (H) 01/04/2020 0346   PLT 171 08/15/2013 0505   BMET    Component Value Date/Time   NA 131 (L) 01/04/2020 0346   NA 133 (L) 08/06/2015 1032   NA 130 (L) 08/15/2013 0505   K 5.1 01/04/2020 0346   K 4.7 08/15/2013 0505   CL 105 01/04/2020 0346   CL 100 08/15/2013 0505   CO2 20 (L) 01/04/2020 0346   CO2 25 08/15/2013 0505   GLUCOSE 210 (H) 01/04/2020 0346   GLUCOSE 215 (H) 08/15/2013 0505   BUN 66 (H) 01/04/2020 0346   BUN 24 08/06/2015 1032   BUN 34 (H) 08/15/2013 0505   CREATININE 2.05 (H) 01/04/2020 0346   CREATININE 1.70 (H) 08/15/2013 0505   CALCIUM 7.7 (L) 01/04/2020 0346   CALCIUM 8.7 08/15/2013 0505   GFRNONAA 30 (L) 01/04/2020 0346   GFRNONAA 39 (L) 08/15/2013 0505   GFRAA 34 (L) 01/04/2020 0346   GFRAA 45 (L) 08/15/2013 0505    Assessment/Planning: The patient is an 79 year old male with 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  CBC in a.m. 2) had discussion in regard to the importance of ambulation, physical therapy and occupational therapy.  Patient is in agreement. 3) we will plan OR dressing change on Sunday.  Discussed with Dr. Ellis Parents Zyana Amaro PA-C 01/04/2020 10:59 AM

## 2020-01-04 NOTE — Progress Notes (Signed)
Pharmacy Antibiotic Note  [name] is a 79 y.o. male admitted on 01/14/2020 with wound infection.  Pharmacy has been consulted for Vancomycin dosing. Patient presents from podiatry office w/ left foot ulcer requiring angiography and revascularization, possible need for amputation.  Noted to have CKD.  Today, 01/04/2020 Day #10 vancomycin/Day #1 Unasyn - SCr stable - WBC improving - afebrile  - 7/12 vancomycin trough = ___18_ mcg/ml on 750mg  IV q24h (prior to 6th overall dose)  Plan:  continue vancomycin 750mg  IV every 24 hours based on [facility] Antimicrobial Dosing Guidelines.   If SCr remains stable, do not think will need another vanco trough based on LOT and level on 7/12.  Follow renal function  Height: 6\' 2"  (188 cm) Weight: 75.8 kg (167 lb) IBW/kg (Calculated) : 82.2  Temp (24hrs), Avg:98 F (36.7 C), Min:97.3 F (36.3 C), Max:98.8 F (37.1 C)  Recent Labs  Lab 12/29/19 0608 12/29/19 0608 12/30/19 0432 12/30/19 0432 12/21/2019 0523 01/14/2020 0942 12/27/2019 1800 01/02/20 0733 12/20/2019 0407 01/04/20 0346  WBC 15.4*  --  12.0*  --   --   --   --   --  19.9* 18.3*  CREATININE 2.18*   < > 2.12*   < > 2.01* 2.16*  --  2.21* 2.13* 2.05*  VANCOTROUGH  --   --   --   --   --   --  18  --   --   --    < > = values in this interval not displayed.    Estimated Creatinine Clearance: 30.3 mL/min (A) (by C-G formula based on SCr of 2.05 mg/dL (H)).    Allergies  Allergen Reactions  . Ivp Dye [Iodinated Diagnostic Agents] Other (See Comments)    Reaction:  Unknown   . Other     Pain meds by mouth FA dye Unknown reaction  . Codeine Nausea And Vomiting and Rash    Antimicrobials this admission: 7/7 flagyl>> 7/16 7/7 CTX>>7/16 7/7 vancomycin>>  7/7 Zosyn x 1  Unasyn 7/16 >>  Microbiology results:  BCx: NGTD  MRSA PCR: neg  Thank you for allowing pharmacy to be a part of this patient's care.  Chinita Greenland PharmD Clinical Pharmacist 01/04/2020

## 2020-01-04 NOTE — Progress Notes (Signed)
Occupational Therapy Treatment Patient Details Name: [name] MRN: [id number] DOB: Aug 14, 1940 Today's Date: 01/04/2020    History of present illness Pt is an 79 y/o M with PMH: PAD, CAD s/p CABG, Bradycardia s/p PPM (2021), HTN, DM II, HLD, CKD, BPH, urinary retention with chronic suprapubic catheter, and HTN. 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  MD assessment also includes: N&V, constipation, hyperglycemia, and microcytic anemia.   OT comments  [name] was seen for OT treatment on this date. Pt now s/p L BKA, however, no re-evaluation necessary since procedure as pt presents at or near same functional level of performance as he was prior to sx/at time of initial OT evaluation. Upon arrival to room pt awake/alert, long-sitting in bed with RN/Caregiver present in room. Pt agreeable to OT tx session, however he endorses significant fatigue from recent physical therapy session, and declines OOB/mobility this date. This session focused on pt education re: role of OT in acute setting, discharge options/considerations (within scope of OT practice), importance of early mobility after sx, residual limb management techniques including strategies for positioning to maximize safety, skin integrity and knee extension, as well as gentle circular massage and gentle tapping for desensitization, and complementary alternative methods for pain management including deep breathing, relaxation, and distraction techniques. Pt verbalized understanding /return demonstrated of instruction provided, but would benefit from further review. Pt making good progress toward goals and continues to benefit from skilled OT services to maximize return to PLOF and minimize risk of future falls, injury, caregiver burden, and readmission. Will continue to follow POC as written. Discharge  recommendation remains appropriate.    Follow Up Recommendations  SNF    Equipment Recommendations  Other (comment) (Defer to next venue of care.)    Recommendations for Other Services      Precautions / Restrictions Precautions Precautions: Fall Precaution Comments: Suprapubic catheter Restrictions Weight Bearing Restrictions: Yes LLE Weight Bearing: Non weight bearing Other Position/Activity Restrictions: s/p BKA       Mobility Bed Mobility Overal bed mobility: Modified Independent             General bed mobility comments: Deferred for pt comfort/safety. Pt expresses significant fatigue from PT session. Per chart pt performed bed mobility mod I with increased time/effort.  Transfers                 General transfer comment: Deferred for pt safety/comfort. Per chart MOD A +2to come to standing from EOB with L lateral instability noted. See PT note.    Balance Overall balance assessment: Needs assistance   Sitting balance-Leahy Scale: Good Sitting balance - Comments: Pt able to come to long sitting in bed w/o back support using hand rail this date.                                   ADL either performed or assessed with clinical judgement   ADL Overall ADL's : Needs assistance/impaired                                       General ADL Comments: Pt continues to require setup to perform UB ADL, could perform in seated position. Pt requires MOD/MAX A  to perform most LB ADLs at bed level d/t pain recent BKA. 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     Vision Baseline Vision/History: Wears glasses Patient Visual Report: No change from baseline     Perception     Praxis      Cognition Arousal/Alertness: Awake/alert Behavior During Therapy: WFL for tasks assessed/performed Overall Cognitive Status: Within Functional Limits for tasks assessed                                           Exercises Other Exercises Other Exercises: Pt educated on role of OT in acute setting, discharge options/considerations (within scope of OT practice), importance of early mobility after sx, residual limb management techniques including strategies for positioning to maximize safety, skin integrity and knee extension, as well as gentle circular massage and gentle tapping for desensitization, and complementary alternative methods for pain management including deep breathing, relaxation, and distraction techniques.   Shoulder Instructions       General Comments wound dressing intact at start/end of session.    Pertinent Vitals/ Pain       Pain Assessment: Faces Faces Pain Scale: Hurts even more Pain Location: LLE Pain Descriptors / Indicators: Grimacing;Sharp;Sore;Constant;Aching Pain Intervention(s): Limited activity within patient's tolerance;Monitored during session;Premedicated before session;Repositioned  Home Living                                          Prior Functioning/Environment              Frequency  Min 2X/week        Progress Toward Goals  OT Goals(current goals can now be found in the care plan section)  Progress towards OT goals: Progressing toward goals  Acute Rehab OT Goals Patient Stated Goal: To get stronger and get back home OT Goal Formulation: With patient Time For Goal Achievement: 01/16/20 Potential to Achieve Goals: Good  Plan Discharge plan remains appropriate;Frequency remains appropriate    Co-evaluation                 AM-PAC OT "6 Clicks" Daily Activity     Outcome Measure   Help from another person eating meals?: None Help from another person taking care of personal grooming?: A Little Help from another person toileting, which includes using toliet, bedpan, or urinal?: A Lot Help from another person bathing (including washing, rinsing, drying)?: A Lot Help from another  person to put on and taking off regular upper body clothing?: A Little Help from another person to put on and taking off regular lower body clothing?: A Lot 6 Click Score: 16    End of Session    OT Visit Diagnosis: Other abnormalities of gait and mobility (R26.89);Muscle weakness (generalized) (M62.81)   Activity Tolerance Patient limited by fatigue;Patient limited by pain   Patient Left in bed;with call bell/phone within reach;with bed alarm set   Nurse Communication          Time: 4782-9562 OT Time Calculation (min): 27 min  Charges: OT General Charges $OT Visit: 1 Visit OT Treatments $Self Care/Home Management : 8-22 mins $Neuromuscular Re-education: 8-22 mins  Shara Blazing, M.S., OTR/L Ascom: [phone] 01/04/20, 4:05 PM

## 2020-01-04 NOTE — Progress Notes (Signed)
PROGRESS NOTE    [name]  DJS:[id number] DOB: 1940-08-28 DOA: 01/19/2020 PCP: Olin Hauser, DO   Chief complaint.  Left leg pain.   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 Patient was started on IV vancomycin, Rocephin and Flagyl, podiatry and vascular surgeon were consulted. Patient had a angiogram and revascularization on 7/12. But the foot gangrene does not seem to be improving, as a result, planning for below-knee amputation.  7/15.  Had a left below-knee amputation performed today. 7/16.  Antibiotic changed to Unasyn in addition to vancomycin.  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 Condition improved after below-knee amputation.  Discussed with pharmacy, will continue antibiotics with vancomycin and Unasyn, may be able to discontinue antibiotics after tomorrow's dose.  2.  Chronic kidney disease stage IIIb. Renal function stable.  #3.  Iron deficient anemia. Status post IV iron, start oral supplement.  4.  Essential hypertension.   Continue Norvasc.   DVT prophylaxis:Heparin Code Status:Full Family Communication:None at bedside Disposition Plan:  Patient  came from:Home  Anticipated d/c place:Possible SNF  Barriers to d/c OR conditions which need to be met to effect a safe d/c:   Consultants:  Podiatry  Vascular surgery  Procedures:SP lower extremity angiogram,  BKA. Antimicrobials: Vancomycin, Unasyn.   Subjective: Patient feels much better today.  No additional nausea vomiting today.  Still complaining of pain at left stump.  Able to participate physical therapy and Occupational Therapy. No short of breath or cough.  Objective: Vitals:   01/16/2020 1922 01/04/20 0050 01/04/20 0533 01/04/20 0746  BP: 132/74 135/70 140/75 130/76  Pulse: 90 87 96 89  Resp: _0 Temp: 98.2 F (36.8 C) 98.8 F (37.1 C) 98 F (36.7 C) 98.1 F (36.7 C)  TempSrc: Oral Oral Oral Oral  SpO2: 100% 98% 100% 97%  Weight:      Height:        Intake/Output Summary (Last 24 hours) at 01/04/2020 1101 Last data filed at 01/04/2020 0945 Gross per 24 hour  Intake 3366.02 ml  Output 1300 ml  Net 2066.02 ml   Filed Weights   01/16/2020 1533  Weight: 75.8 kg    Examination:  General exam: Appears calm and comfortable  Respiratory system: Clear to auscultation. Respiratory effort normal. Cardiovascular system: S1 & S2 heard, RRR. No JVD, murmurs, rubs, gallops or clicks. No pedal edema. Gastrointestinal system: Abdomen is nondistended, soft and nontender. No organomegaly or masses felt. Normal bowel sounds heard. Central nervous system: Alert and oriented. No focal neurological deficits. Extremities: Left BKA Skin: No rashes, lesions or ulcers Psychiatry: Judgement and insight appear normal. Mood & affect appropriate.     Data Reviewed: I have personally reviewed following labs and imaging studies  CBC: Recent Labs  Lab 12/29/19  7353 12/30/19 0432 12/28/2019 0407 01/04/20 0346  WBC 15.4* 12.0* 19.9* 18.3*    NEUTROABS  --   --  17.5* 15.5*  HGB 10.3* 10.6* 10.2* 8.3*  HCT 30.6* 32.1* 30.8* 25.2*  MCV 78.3* 78.7* 78.2* 79.2*  PLT 279 316 403* 299*   Basic Metabolic Panel: Recent Labs  Lab 12/29/19 0608 12/29/19 0608 12/30/19 0432 12/30/19 0432 01/11/2020 0523 12/20/2019 0942 01/02/20 0733 01/13/2020 0407 01/04/20 0346  NA 129*  --  130*  --   --   --   --  132* 131*  K 4.8  --  4.9  --   --   --   --  4.4 5.1  CL 98  --  99  --   --   --   --  102 105  CO2 24  --  22  --   --   --   --  21* 20*  GLUCOSE 178*  --  158*  --   --   --   --  99 210*  BUN 53*  --  55*  --   --  60*  --  69* 66*  CREATININE 2.18*   < > 2.12*   < > 2.01* 2.16* 2.21* 2.13* 2.05*  CALCIUM 8.3*  --  8.5*  --   --   --   --  8.1* 7.7*  MG 2.2  --  2.0  --   --   --   --  2.0 2.0   < > = values in this interval not displayed.   GFR: Estimated Creatinine Clearance: 30.3 mL/min (A) (by C-G formula based on SCr of 2.05 mg/dL (H)). Liver Function Tests: No results for input(s): AST, ALT, ALKPHOS, BILITOT, PROT, ALBUMIN in the last 168 hours. No results for input(s): LIPASE, AMYLASE in the last 168 hours. No results for input(s): AMMONIA in the last 168 hours. Coagulation Profile: Recent Labs  Lab 01/19/2020 0407  INR 1.2   Cardiac Enzymes: No results for input(s): CKTOTAL, CKMB, CKMBINDEX, TROPONINI in the last 168 hours. BNP (last 3 results) No results for input(s): PROBNP in the last 8760 hours. HbA1C: No results for input(s): HGBA1C in the last 72 hours. CBG: Recent Labs  Lab 12/30/2019 0407 12/27/2019 0938 01/13/2020 1150 01/14/2020 1640 12/29/2019 2043  GLUCAP 87 144* 142* 214* 176*   Lipid Profile: No results for input(s): CHOL, HDL, LDLCALC, TRIG, CHOLHDL, LDLDIRECT in the last 72 hours. Thyroid Function Tests: No results for input(s): TSH, T4TOTAL, FREET4, T3FREE, THYROIDAB in the last 72 hours. Anemia Panel: No results for input(s): VITAMINB12, FOLATE, FERRITIN, TIBC, IRON, RETICCTPCT in the last 72  hours. Sepsis Labs: No results for input(s): PROCALCITON, LATICACIDVEN in the last 168 hours.  Recent Results (from the past 240 hour(s))  Blood Cultures x 2 sites     Status: None   Collection Time: 12/30/2019  8:48 PM   Specimen: BLOOD  Result Value Ref Range Status   Specimen Description BLOOD BLOOD LEFT FOREARM  Final   Special Requests   Final    BOTTLES DRAWN AEROBIC AND ANAEROBIC Blood Culture adequate volume   Culture   Final    NO GROWTH 5 DAYS Performed at [facility], [address] [place], [place] [address]    Report Status 12/28/2019 FINAL  Final  Culture, blood (Routine X 2) w Reflex to ID Panel     Status: None   Collection Time: 12/27/19  7:01 PM   Specimen: BLOOD  Result Value Ref Range Status   Specimen Description BLOOD BLOOD RIGHT HAND  Final   Special Requests   Final    BOTTLES DRAWN AEROBIC AND ANAEROBIC Blood Culture adequate volume   Culture   Final    NO GROWTH 5 DAYS Performed at [facility], [address] [place], [place] [address]    Report Status 01/13/2020 FINAL  Final  SARS Coronavirus 2 by RT PCR (hospital order, performed in [facility] hospital lab) Nasopharyngeal Nasopharyngeal Swab     Status: None   Collection Time: 12/27/19  8:45 PM   Specimen: Nasopharyngeal Swab  Result Value Ref Range Status   SARS Coronavirus 2 NEGATIVE NEGATIVE Final    Comment: (NOTE) SARS-CoV-2 target nucleic acids are NOT DETECTED.  The SARS-CoV-2 RNA is generally detectable in upper and lower respiratory specimens during the acute phase of infection. The lowest concentration of SARS-CoV-2 viral copies this assay can detect is 250 copies / mL. A negative result does not preclude SARS-CoV-2 infection and should not be used as the sole basis for treatment or other patient management decisions.  A negative result may occur with improper specimen collection / handling, submission of specimen other than nasopharyngeal swab, presence of viral  mutation(s) within the areas targeted by this assay, and inadequate number of viral copies (<250 copies / mL). A negative result must be combined with clinical observations, patient history, and epidemiological information.  Fact Sheet for Patients:   StrictlyIdeas.no  Fact Sheet for Healthcare Providers: BankingDealers.co.za  This test is not yet approved or  cleared by the [place] FDA and has been authorized for detection and/or diagnosis of SARS-CoV-2 by FDA under an Emergency Use Authorization (EUA).  This EUA will remain in effect (meaning this test can be used) for the duration of the COVID-19 declaration under Section 564(b)(1) of the Act, 21 U.S.C. section 360bbb-3(b)(1), unless the authorization is terminated or revoked sooner.  Performed at [facility], [address] [place], [place] [address]   MRSA PCR Screening     Status: None   Collection Time: 12/27/19  8:45 PM   Specimen: Nasopharyngeal  Result Value Ref Range Status   MRSA by PCR NEGATIVE NEGATIVE Final    Comment:        The GeneXpert MRSA Assay (FDA approved for NASAL specimens only), is one component of a comprehensive MRSA colonization surveillance program. It is not intended to diagnose MRSA infection nor to guide or monitor treatment for MRSA infections. Performed at [facility], [address], [place], [place] [address]          Radiology Studies: No results found.      Scheduled Meds: . amLODipine  10 mg Oral Daily  . aspirin EC  81 mg Oral Daily  . Chlorhexidine Gluconate Cloth  6 each Topical Daily  . fluticasone  2 spray Each Nare Daily  . gabapentin  100 mg Oral QHS  . heparin  5,000 Units Subcutaneous Q8H  . insulin aspart  0-15 Units Subcutaneous TID WC  . insulin aspart  10 Units Subcutaneous TID WC  . insulin glargine  10 Units Subcutaneous Daily  . lidocaine  1 patch Transdermal Q24H  .  loratadine  10 mg Oral Daily  . multivitamin with minerals  1 tablet Oral Daily  . nutrition supplement (JUVEN)  1 packet Oral BID BM  . pantoprazole  40 mg Oral Daily  . polyethylene glycol  17 g Oral BID  . Ensure  Max Protein  11 oz Oral Daily  . senna-docusate  2 tablet Oral BID  . simvastatin  10 mg Oral QHS  . sodium chloride flush  3 mL Intravenous Once  . tamsulosin  0.4 mg Oral Daily   Continuous Infusions: . ampicillin-sulbactam (UNASYN) IV    . dextrose 5 % and 0.9% NaCl 75 mL/hr at 01/04/20 0551  . vancomycin Stopped (01/08/2020 1912)     LOS: 9 days    Time spent: 28 minutes    Sharen Hones, MD [facility]   To contact the attending provider between 7A-7P or the covering provider during after hours 7P-7A, please log into the web site [email or web address] and access using universal [place] password for that web site. If you do not have the password, please call the hospital operator.  01/04/2020, 11:01 AM

## 2020-01-04 NOTE — Evaluation (Signed)
Physical Therapy Re-Evaluation Patient Details Name: [name] MRN: [id number] DOB: 1940/09/24 Today's Date: 01/04/2020   History of Present Illness  Pt is an 79 y/o M with PMH: PAD, CAD s/p CABG, Bradycardia s/p PPM (2021), HTN, DM II, HLD, CKD, BPH, urinary retention with chronic suprapubic catheter, and HTN. 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  MD assessment also includes: N&V, constipation, hyperglycemia, and microcytic anemia.    Clinical Impression  Pt pleasant and motivated to participate during the session.  Pt reported 5/10 pain in his residual limb but reported feeling that his LLE pain was "a lot better" than pre-surgery.  Pt put forth good effort throughout the session with no adverse symptoms other than LLE pain that did not worsen during the session from baseline.  Pt was able to perform bed mobility tasks without assistance but required significantly increased time, effort, and use of rails and trapeze bar. Pt required extensive assistance to come to standing as well as to maintain static standing position with noted L lateral and posterior instability.  Pt will benefit from PT services in a SNF setting upon discharge to safely address deficits listed in patient problem list for decreased caregiver assistance and eventual return to PLOF.        Follow Up Recommendations SNF    Equipment Recommendations  Rolling walker with 5" wheels    Recommendations for Other Services       Precautions / Restrictions Precautions Precautions: Fall Precaution Comments: Suprapubic catheter Restrictions Weight Bearing Restrictions: Yes LLE Weight Bearing: Non weight bearing      Mobility  Bed Mobility Overal bed mobility: Modified Independent Bed Mobility: Supine to Sit;Sit to Supine     Supine to sit: Modified independent (Device/Increase  time) Sit to supine: Modified independent (Device/Increase time)   General bed mobility comments: Extra time and effort only  Transfers Overall transfer level: Needs assistance Equipment used: Rolling walker (2 wheeled) Transfers: Sit to/from Stand Sit to Stand: From elevated surface;+2 physical assistance;+2 safety/equipment;Mod assist         General transfer comment: Mod verbal and tactile cues for sequencing with +2 Mod A to stand and to maintain static standing balance with posterior and L lateral instability noted  Ambulation/Gait             General Gait Details: Unable  Stairs            Wheelchair Mobility    Modified Rankin (Stroke Patients Only)       Balance Overall balance assessment: Needs assistance   Sitting balance-Leahy Scale: Good     Standing balance support: Bilateral upper extremity supported Standing balance-Leahy Scale: Poor Standing balance comment: +2 Mod A for static standing stability                             Pertinent Vitals/Pain Pain Assessment: 0-10 Pain Score: 5  Pain Location: states 4 at rest, but cannot tolerate any pressure to L foot Pain Descriptors / Indicators: Aching;Sore;Throbbing Pain Intervention(s): Premedicated before session;Monitored during session    [place] expects to be discharged to:: Private residence Living Arrangements: Alone Available Help at Discharge: Family;Available PRN/intermittently Type of Home: Independent living facility Home Access: Level entry     Home Layout: One level Home Equipment: Walker - 4 wheels;Bedside commode;Grab  bars - tub/shower;Shower seat - built in      Prior Function Level of Independence: Independent with assistive device(s)         Comments: Mod Ind amb with a rollator, no falls in the last year, Ind with ADLs; DIL and SIL assist with groceries/errands or uses [employer], no longer drives     Hand Dominance   Dominant Hand:  Right    Extremity/Trunk Assessment   Upper Extremity Assessment Upper Extremity Assessment: Generalized weakness    Lower Extremity Assessment Lower Extremity Assessment: Generalized weakness;LLE deficits/detail LLE: Unable to fully assess due to pain       Communication   Communication: No difficulties  Cognition Arousal/Alertness: Awake/alert Behavior During Therapy: WFL for tasks assessed/performed Overall Cognitive Status: Within Functional Limits for tasks assessed                                        General Comments      Exercises Total Joint Exercises Ankle Circles/Pumps: AROM;Strengthening;10 reps;Right;5 reps Quad Sets: Strengthening;Both;10 reps;AROM;15 reps Gluteal Sets: Strengthening;Both;10 reps Heel Slides: AROM;Right;5 reps Hip ABduction/ADduction: Strengthening;Both;10 reps (with manual resistance) Straight Leg Raises: Strengthening;Both;10 reps (with manual resistance on the LLE) Long Arc Quad: Strengthening;Both;10 reps;AROM;15 reps Knee Flexion: Strengthening;Both;10 reps;15 reps;AROM Other Exercises Other Exercises: Positioning education to promote L knee ext PROM   Assessment/Plan    PT Assessment Patient needs continued PT services  PT Problem List Decreased strength;Decreased activity tolerance;Decreased balance;Decreased mobility;Decreased knowledge of use of DME;Decreased knowledge of precautions;Pain       PT Treatment Interventions DME instruction;Gait training;Functional mobility training;Therapeutic activities;Therapeutic exercise;Balance training;Patient/family education    PT Goals (Current goals can be found in the Care Plan section)  Acute Rehab PT Goals Patient Stated Goal: To get stronger and get back home PT Goal Formulation: With patient Time For Goal Achievement: 02-15-20 Potential to Achieve Goals: Fair    Frequency 7X/week   Barriers to discharge Inaccessible home environment;Decreased caregiver  support      Co-evaluation               AM-PAC PT "6 Clicks" Mobility  Outcome Measure Help needed turning from your back to your side while in a flat bed without using bedrails?: A Little Help needed moving from lying on your back to sitting on the side of a flat bed without using bedrails?: A Little Help needed moving to and from a bed to a chair (including a wheelchair)?: A Lot Help needed standing up from a chair using your arms (e.g., wheelchair or bedside chair)?: A Lot Help needed to walk in hospital room?: Total Help needed climbing 3-5 steps with a railing? : Total 6 Click Score: 12    End of Session Equipment Utilized During Treatment: Gait belt Activity Tolerance: Patient tolerated treatment well Patient left: in bed;with call bell/phone within reach;with bed alarm set Nurse Communication: Mobility status PT Visit Diagnosis: Unsteadiness on feet (R26.81);Difficulty in walking, not elsewhere classified (R26.2);Muscle weakness (generalized) (M62.81);Pain Pain - Right/Left: Left Pain - part of body: Leg    Time: 1006-1050 PT Time Calculation (min) (ACUTE ONLY): 44 min   Charges:   PT Evaluation $PT Re-evaluation: 1 Re-eval PT Treatments $Therapeutic Exercise: 8-22 mins $Therapeutic Activity: 8-22 mins        D. Scott Monet North PT, DPT 01/04/20, 11:17 AM

## 2020-01-04 NOTE — Progress Notes (Signed)
°   01/04/20 1045  Clinical Encounter Type  Visited With Patient  Visit Type Follow-up;Spiritual support;Social support  Referral From Chaplain  Consult/Referral To Chaplain  Ch followed-with Pt per request from night Ch. Pt was in good spirits.  Pt said that he feels a lot better than he did yesterday. PT said he has a new outlook on life, and he is going to do everything he can to get back to his quality of life.  Ch prayed for Pt and will follow-up with Pt.

## 2020-01-05 DIAGNOSIS — L97523 Non-pressure chronic ulcer of other part of left foot with necrosis of muscle: Secondary | ICD-10-CM | POA: Diagnosis not present

## 2020-01-05 DIAGNOSIS — I10 Essential (primary) hypertension: Secondary | ICD-10-CM | POA: Diagnosis not present

## 2020-01-05 DIAGNOSIS — E1159 Type 2 diabetes mellitus with other circulatory complications: Secondary | ICD-10-CM | POA: Diagnosis not present

## 2020-01-05 DIAGNOSIS — N1832 Chronic kidney disease, stage 3b: Secondary | ICD-10-CM | POA: Diagnosis not present

## 2020-01-05 LAB — BASIC METABOLIC PANEL
Anion gap: 9 (ref 5–15)
BUN: 78 mg/dL — ABNORMAL HIGH (ref 8–23)
CO2: 20 mmol/L — ABNORMAL LOW (ref 22–32)
Calcium: 8.1 mg/dL — ABNORMAL LOW (ref 8.9–10.3)
Chloride: 102 mmol/L (ref 98–111)
Creatinine, Ser: 2.27 mg/dL — ABNORMAL HIGH (ref 0.61–1.24)
GFR calc Af Amer: 30 mL/min — ABNORMAL LOW (ref >60)
GFR calc non Af Amer: 26 mL/min — ABNORMAL LOW (ref >60)
Glucose, Bld: 153 mg/dL — ABNORMAL HIGH (ref 70–99)
Potassium: 5.8 mmol/L — ABNORMAL HIGH (ref 3.5–5.1)
Sodium: 131 mmol/L — ABNORMAL LOW (ref 135–145)

## 2020-01-05 LAB — CBC WITH DIFFERENTIAL/PLATELET
Abs Immature Granulocytes: 1.91 10*3/uL — ABNORMAL HIGH (ref 0.00–0.07)
Basophils Absolute: 0.1 10*3/uL (ref 0.0–0.1)
Basophils Relative: 0 %
Eosinophils Absolute: 0.2 10*3/uL (ref 0.0–0.5)
Eosinophils Relative: 1 %
HCT: 26.4 % — ABNORMAL LOW (ref 39.0–52.0)
Hemoglobin: 8.7 g/dL — ABNORMAL LOW (ref 13.0–17.0)
Immature Granulocytes: 9 %
Lymphocytes Relative: 10 %
Lymphs Abs: 2.2 10*3/uL (ref 0.7–4.0)
MCH: 25.8 pg — ABNORMAL LOW (ref 26.0–34.0)
MCHC: 33 g/dL (ref 30.0–36.0)
MCV: 78.3 fL — ABNORMAL LOW (ref 80.0–100.0)
Monocytes Absolute: 1.1 10*3/uL — ABNORMAL HIGH (ref 0.1–1.0)
Monocytes Relative: 5 %
Neutro Abs: 16.9 10*3/uL — ABNORMAL HIGH (ref 1.7–7.7)
Neutrophils Relative %: 75 %
Platelets: 542 10*3/uL — ABNORMAL HIGH (ref 150–400)
RBC: 3.37 MIL/uL — ABNORMAL LOW (ref 4.22–5.81)
RDW: 16.1 % — ABNORMAL HIGH (ref 11.5–15.5)
WBC: 22.4 10*3/uL — ABNORMAL HIGH (ref 4.0–10.5)
nRBC: 0.2 % (ref 0.0–0.2)

## 2020-01-05 LAB — MAGNESIUM: Magnesium: 2.1 mg/dL (ref 1.7–2.4)

## 2020-01-05 LAB — GLUCOSE, CAPILLARY
Glucose-Capillary: 152 mg/dL — ABNORMAL HIGH (ref 70–99)
Glucose-Capillary: 215 mg/dL — ABNORMAL HIGH (ref 70–99)
Glucose-Capillary: 198 mg/dL — ABNORMAL HIGH (ref 70–99)
Glucose-Capillary: 157 mg/dL — ABNORMAL HIGH (ref 70–99)

## 2020-01-05 LAB — POTASSIUM: Potassium: 4.5 mmol/L (ref 3.5–5.1)

## 2020-01-05 LAB — PROCALCITONIN: Procalcitonin: 0.48 ng/mL

## 2020-01-05 MED ORDER — SODIUM CHLORIDE 0.9 % IV SOLN
INTRAVENOUS | Status: DC | PRN
  Administered 2020-01-05 – 2020-01-16 (×3): 250 mL via INTRAVENOUS

## 2020-01-05 MED ORDER — CALCIUM GLUCONATE-NACL 1-0.675 GM/50ML-% IV SOLN
1.0000 g | Freq: Once | INTRAVENOUS | Status: AC
  Administered 2020-01-05: 1000 mg via INTRAVENOUS
  Filled 2020-01-05: qty 50

## 2020-01-05 MED ORDER — SODIUM BICARBONATE 8.4 % IV SOLN
50.0000 meq | Freq: Once | INTRAVENOUS | Status: AC
  Administered 2020-01-05: 50 meq via INTRAVENOUS
  Filled 2020-01-05: qty 50

## 2020-01-05 MED ORDER — SODIUM POLYSTYRENE SULFONATE 15 GM/60ML PO SUSP
45.0000 g | Freq: Once | ORAL | Status: AC
  Administered 2020-01-05: 45 g via ORAL
  Filled 2020-01-05: qty 180

## 2020-01-05 MED ORDER — STERILE WATER FOR INJECTION IV SOLN
INTRAVENOUS | Status: DC
  Filled 2020-01-05 (×2): qty 850
  Filled 2020-01-05: qty 150
  Filled 2020-01-05 (×2): qty 850

## 2020-01-05 NOTE — Progress Notes (Signed)
Physical Therapy Treatment Patient Details Name: [name] MRN: [id number] DOB: December 22, 1940 Today's Date: 01/05/2020    History of Present Illness Pt is an 79 y/o M with PMH: PAD, CAD s/p CABG, Bradycardia s/p PPM (2021), HTN, DM II, HLD, CKD, BPH, urinary retention with chronic suprapubic catheter, and HTN. 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  MD assessment also includes: N&V, constipation, hyperglycemia, and microcytic anemia.    PT Comments    Walking by room, pt inc of large loose BM.  Rolling left/right with min a x 1 and max a for care.  Further session deferred at this time.  Potassium 5.8 and being addressed.  C/O nausea and general pain.  Will continue tomorrow as appropriate.   Follow Up Recommendations  SNF     Equipment Recommendations  Rolling walker with 5" wheels    Recommendations for Other Services       Precautions / Restrictions Precautions Precautions: Fall Precaution Comments: Suprapubic catheter Restrictions Weight Bearing Restrictions: Yes LLE Weight Bearing: Non weight bearing Other Position/Activity Restrictions: s/p BKA    Mobility  Bed Mobility Overal bed mobility: Needs Assistance Bed Mobility: Rolling Rolling: Min assist            Transfers                    Ambulation/Gait                 Stairs             Wheelchair Mobility    Modified Rankin (Stroke Patients Only)       Balance                                            Cognition Arousal/Alertness: Awake/alert Behavior During Therapy: WFL for tasks assessed/performed Overall Cognitive Status: Within Functional Limits for tasks assessed                                        Exercises      General Comments        Pertinent Vitals/Pain Pain Assessment:  Faces Faces Pain Scale: Hurts even more Pain Location: LLE Pain Descriptors / Indicators: Grimacing;Sharp;Sore;Constant;Aching Pain Intervention(s): Limited activity within patient's tolerance;Monitored during session    Home Living                      Prior Function            PT Goals (current goals can now be found in the care plan section) Progress towards PT goals: Progressing toward goals    Frequency    7X/week      PT Plan      Co-evaluation              AM-PAC PT "6 Clicks" Mobility   Outcome Measure  Help needed turning from your back to your side while in a flat bed without using bedrails?: A Little Help needed moving from lying on your back to sitting on the side of a flat bed without using bedrails?: A Little Help needed moving to and from a bed  to a chair (including a wheelchair)?: A Lot Help needed standing up from a chair using your arms (e.g., wheelchair or bedside chair)?: A Lot Help needed to walk in hospital room?: Total Help needed climbing 3-5 steps with a railing? : Total 6 Click Score: 12    End of Session Equipment Utilized During Treatment: Gait belt Activity Tolerance: Patient tolerated treatment well Patient left: in bed;with call bell/phone within reach;with bed alarm set Nurse Communication: Mobility status PT Visit Diagnosis: Unsteadiness on feet (R26.81);Difficulty in walking, not elsewhere classified (R26.2);Muscle weakness (generalized) (M62.81);Pain Pain - Right/Left: Left Pain - part of body: Leg     Time: 5102-5852 PT Time Calculation (min) (ACUTE ONLY): 10 min  Charges:  $Therapeutic Activity: 8-22 mins                     Chesley Noon, PTA 01/05/20, 11:51 AM

## 2020-01-05 NOTE — Progress Notes (Signed)
PROGRESS NOTE    [name]  OVF:[id number] DOB: 12-30-1940 DOA: 01/19/2020 PCP: Olin Hauser, DO   Chief complaint. Leg pain.  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 Patient was started on IV vancomycin, Rocephin and Flagyl, podiatry and vascular surgeon were consulted. Patient had a angiogram and revascularization on 7/12. But the foot gangrene does not seem to be improving, as a result, planning for below-knee amputation.  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 7/16.  Antibiotic changed to Unasyn in addition to vancomycin. 7/17. Developed hyperkalemia, most likely due to surgery. Given Kayexalate, sodium bicarb, calcium gluconate and additional sodium bicarb drip. Increased leukocytosis, probably from trauma.     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 Slightly worsening renal function today. Hyperkalemia is due to trauma from surgery. Less likely from vancomycin. Patient will finish the last dose of vancomycin today. He will be treated with sodium bicarb, Kayexalate and calcium gluconate. Will monitor calcium closely today.  2. Left foot diabetic ulcer with cellulitis, gangrene secondary to  peripheral vessel disease. Status post below-knee amputation. Patient will need another day of antibiotics and discontinue tomorrow. Patient has a worsening leukocytosis, could be from trauma from surgery. Procalcitonin level 0.48, can be up explained by renal function. I will repeat procalcitonin level tomorrow. For now, I will continue antibiotics with vancomycin and Unasyn for last day. We will plan to discontinue tomorrow. Patient has a chronic Foley catheter,  UA and urine culture are also sent out.  3. Iron deficient anemia. Continue oral supplement. Status post IV iron.  4. Essential hypertension. Continue Norvasc.  DVT prophylaxis:Heparin Code Status:Full Family Communication:None at bedside Disposition Plan:  Patient came from:Home  Anticipated d/c place:Possible SNF  Barriers to d/c OR conditions which need to be met to effect a safe d/c:   Consultants:  Podiatry  Vascular surgery  Procedures:SP lower extremity angiogram, BKA. Antimicrobials: Vancomycin, Unasyn.    Subjective: Patient feels tired today. But no nausea vomiting. No diarrhea. Has some sore in the leg from physical therapy yesterday. No short of breath or cough. No fever or chills.  Objective: Vitals:   01/04/20 0746 01/04/20 1617 01/04/20 2357 01/05/20 0802  BP: 130/76 130/77 117/65 129/75  Pulse: 89 91 86 96  Resp: _0 Temp: 98.1 F (36.7 C) 98.6 F (37 C) 98.5 F (36.9 C) 98.6 F (37 C)  TempSrc: Oral Oral Oral Oral  SpO2: 97% 97% 93% 95%  Weight:      Height:        Intake/Output Summary (Last 24 hours) at 01/05/2020 1309 Last data filed at 01/05/2020 1043 Gross per 24 hour  Intake 480 ml  Output 1075 ml  Net -595 ml   Filed Weights   12/25/2019 1533  Weight: 75.8 kg  Examination:  General exam: Appears calm and comfortable    Respiratory system: Clear to auscultation. Respiratory effort normal. Cardiovascular system: S1 & S2 heard, RRR. No JVD, murmurs, rubs, gallops or clicks. No pedal edema. Gastrointestinal system: Abdomen is nondistended, soft and nontender. No organomegaly or masses felt. Normal bowel sounds heard. Central nervous system: Alert and oriented. No focal neurological deficits. Extremities: Left BKA Skin: No rashes, lesions or ulcers Psychiatry: Judgement and insight appear normal. Mood & affect appropriate.     Data Reviewed: I have personally reviewed following labs and imaging studies  CBC: Recent Labs  Lab 12/30/19 0432 01/13/2020 0407 01/04/20 0346 01/05/20 0444  WBC 12.0* 19.9* 18.3* 22.4*  NEUTROABS  --  17.5* 15.5* 16.9*  HGB 10.6* 10.2* 8.3* 8.7*  HCT 32.1* 30.8* 25.2* 26.4*  MCV 78.7* 78.2* 79.2* 78.3*  PLT 316 403* 411* 601*   Basic Metabolic Panel: Recent Labs  Lab 12/30/19 0432 01/09/2020 0523 12/20/2019 0942 01/02/20 0733 12/29/2019 0407 01/04/20 0346 01/05/20 0444  NA 130*  --   --   --  132* 131* 131*  K 4.9  --   --   --  4.4 5.1 5.8*  CL 99  --   --   --  102 105 102  CO2 22  --   --   --  21* 20* 20*  GLUCOSE 158*  --   --   --  99 210* 153*  BUN 55*  --  60*  --  69* 66* 78*  CREATININE 2.12*   < > 2.16* 2.21* 2.13* 2.05* 2.27*  CALCIUM 8.5*  --   --   --  8.1* 7.7* 8.1*  MG 2.0  --   --   --  2.0 2.0 2.1   < > = values in this interval not displayed.   GFR: Estimated Creatinine Clearance: 27.4 mL/min (A) (by C-G formula based on SCr of 2.27 mg/dL (H)). Liver Function Tests: No results for input(s): AST, ALT, ALKPHOS, BILITOT, PROT, ALBUMIN in the last 168 hours. No results for input(s): LIPASE, AMYLASE in the last 168 hours. No results for input(s): AMMONIA in the last 168 hours. Coagulation Profile: Recent Labs  Lab 12/29/2019 0407  INR 1.2   Cardiac Enzymes: No results for input(s): CKTOTAL, CKMB, CKMBINDEX, TROPONINI in the last 168 hours. BNP  (last 3 results) No results for input(s): PROBNP in the last 8760 hours. HbA1C: No results for input(s): HGBA1C in the last 72 hours. CBG: Recent Labs  Lab 01/04/20 1116 01/04/20 1640 01/04/20 2146 01/05/20 0809 01/05/20 1157  GLUCAP 247* 197* 204* 152* 215*   Lipid Profile: No results for input(s): CHOL, HDL, LDLCALC, TRIG, CHOLHDL, LDLDIRECT in the last 72 hours. Thyroid Function Tests: No results for input(s): TSH, T4TOTAL, FREET4, T3FREE, THYROIDAB in the last 72 hours. Anemia Panel: No results for input(s): VITAMINB12, FOLATE, FERRITIN, TIBC, IRON, RETICCTPCT in the last 72 hours. Sepsis Labs: Recent Labs  Lab 01/05/20 0444  PROCALCITON 0.48    Recent Results (from the past 240 hour(s))  Blood Cultures x 2 sites     Status: None   Collection Time: 01/15/2020  8:48 PM   Specimen: BLOOD  Result Value Ref Range Status   Specimen Description BLOOD BLOOD LEFT FOREARM  Final   Special Requests   Final    BOTTLES DRAWN AEROBIC AND ANAEROBIC Blood Culture adequate volume   Culture   Final    NO GROWTH 5 DAYS Performed at [facility], [place]  [address] [place], [place] [address]    Report Status 01/04/2020 FINAL  Final  Culture, blood (Routine X 2) w Reflex to ID Panel     Status: None   Collection Time: 12/27/19  7:01 PM   Specimen: BLOOD  Result Value Ref Range Status   Specimen Description BLOOD BLOOD RIGHT HAND  Final   Special Requests   Final    BOTTLES DRAWN AEROBIC AND ANAEROBIC Blood Culture adequate volume   Culture   Final    NO GROWTH 5 DAYS Performed at [facility], [address], [place], [place] [address]    Report Status 12/31/2019 FINAL  Final  SARS Coronavirus 2 by RT PCR (hospital order, performed in [facility] hospital lab) Nasopharyngeal Nasopharyngeal Swab     Status: None   Collection Time: 12/27/19  8:45 PM   Specimen: Nasopharyngeal Swab  Result Value Ref Range Status   SARS Coronavirus 2 NEGATIVE NEGATIVE Final     Comment: (NOTE) SARS-CoV-2 target nucleic acids are NOT DETECTED.  The SARS-CoV-2 RNA is generally detectable in upper and lower respiratory specimens during the acute phase of infection. The lowest concentration of SARS-CoV-2 viral copies this assay can detect is 250 copies / mL. A negative result does not preclude SARS-CoV-2 infection and should not be used as the sole basis for treatment or other patient management decisions.  A negative result may occur with improper specimen collection / handling, submission of specimen other than nasopharyngeal swab, presence of viral mutation(s) within the areas targeted by this assay, and inadequate number of viral copies (<250 copies / mL). A negative result must be combined with clinical observations, patient history, and epidemiological information.  Fact Sheet for Patients:   BoilerBrush.com.cy  Fact Sheet for Healthcare Providers: https://pope.com/  This test is not yet approved or  cleared by the [place] FDA and has been authorized for detection and/or diagnosis of SARS-CoV-2 by FDA under an Emergency Use Authorization (EUA).  This EUA will remain in effect (meaning this test can be used) for the duration of the COVID-19 declaration under Section 564(b)(1) of the Act, 21 U.S.C. section 360bbb-3(b)(1), unless the authorization is terminated or revoked sooner.  Performed at [facility], [address] [place], [place] [address]   MRSA PCR Screening     Status: None   Collection Time: 12/27/19  8:45 PM   Specimen: Nasopharyngeal  Result Value Ref Range Status   MRSA by PCR NEGATIVE NEGATIVE Final    Comment:        The GeneXpert MRSA Assay (FDA approved for NASAL specimens only), is one component of a comprehensive MRSA colonization surveillance program. It is not intended to diagnose MRSA infection nor to guide or monitor treatment for MRSA  infections. Performed at [facility], [address] [place], [place] [address]          Radiology Studies: No results found.      Scheduled Meds: . amLODipine  10 mg Oral Daily  . aspirin EC  81 mg Oral Daily  . Chlorhexidine Gluconate Cloth  6 each Topical Daily  . ferrous sulfate  325 mg Oral Q breakfast  . fluticasone  2 spray Each Nare Daily  . gabapentin  100 mg Oral QHS  . heparin  5,000 Units Subcutaneous Q8H  . insulin aspart  0-15 Units Subcutaneous TID WC  . insulin aspart  10 Units Subcutaneous TID WC  . insulin glargine  10 Units Subcutaneous Daily  . lidocaine  1 patch Transdermal Q24H  . loratadine  10 mg Oral Daily  . multivitamin with minerals  1 tablet Oral Daily  . nutrition supplement (JUVEN)  1 packet Oral BID BM  . pantoprazole  40 mg Oral Daily  . polyethylene glycol  17 g Oral BID  . Ensure Max Protein  11 oz Oral Daily  . senna-docusate  2 tablet Oral BID  . simvastatin  10 mg Oral QHS  . sodium chloride flush  3 mL Intravenous Once  . tamsulosin  0.4 mg Oral Daily   Continuous Infusions: . sodium chloride 250 mL (01/05/20 0616)  . ampicillin-sulbactam (UNASYN) IV 3 g (01/05/20 0617)  .  sodium bicarbonate (isotonic) infusion in sterile water 75 mL/hr at 01/05/20 1144  . vancomycin 750 mg (01/04/20 1810)     LOS: 10 days    Time spent: 28 minutes    Sharen Hones, MD [facility]   To contact the attending provider between 7A-7P or the covering provider during after hours 7P-7A, please log into the web site [email or web address] and access using universal [place] password for that web site. If you do not have the password, please call the hospital operator.  01/05/2020, 1:09 PM

## 2020-01-05 NOTE — Progress Notes (Signed)
Dear Doctor:  This patient has been identified as a candidate for PICC for the following reason (s): poor veins/poor circulatory system (CHF, COPD, emphysema, diabetes, steroid use, IV drug abuse, etc.) and restarts due to phlebitis and infiltration in 24 hours If you agree, please write an order for the indicated device. For any questions contact the Vascular Access Team at [phone] if no answer, please leave a message.  Thank you for supporting the early vascular access assessment program.

## 2020-01-06 DIAGNOSIS — E1159 Type 2 diabetes mellitus with other circulatory complications: Secondary | ICD-10-CM | POA: Diagnosis not present

## 2020-01-06 DIAGNOSIS — I10 Essential (primary) hypertension: Secondary | ICD-10-CM | POA: Diagnosis not present

## 2020-01-06 DIAGNOSIS — N1832 Chronic kidney disease, stage 3b: Secondary | ICD-10-CM | POA: Diagnosis not present

## 2020-01-06 DIAGNOSIS — L97523 Non-pressure chronic ulcer of other part of left foot with necrosis of muscle: Secondary | ICD-10-CM | POA: Diagnosis not present

## 2020-01-06 LAB — CBC WITH DIFFERENTIAL/PLATELET
Abs Immature Granulocytes: 1.05 10*3/uL — ABNORMAL HIGH (ref 0.00–0.07)
Basophils Absolute: 0 10*3/uL (ref 0.0–0.1)
Basophils Relative: 0 %
Eosinophils Absolute: 0.2 10*3/uL (ref 0.0–0.5)
Eosinophils Relative: 1 %
HCT: 23.9 % — ABNORMAL LOW (ref 39.0–52.0)
Hemoglobin: 8.2 g/dL — ABNORMAL LOW (ref 13.0–17.0)
Immature Granulocytes: 7 %
Lymphocytes Relative: 11 %
Lymphs Abs: 1.6 10*3/uL (ref 0.7–4.0)
MCH: 27.1 pg (ref 26.0–34.0)
MCHC: 34.3 g/dL (ref 30.0–36.0)
MCV: 78.9 fL — ABNORMAL LOW (ref 80.0–100.0)
Monocytes Absolute: 0.9 10*3/uL (ref 0.1–1.0)
Monocytes Relative: 6 %
Neutro Abs: 10.9 10*3/uL — ABNORMAL HIGH (ref 1.7–7.7)
Neutrophils Relative %: 75 %
Platelets: 497 10*3/uL — ABNORMAL HIGH (ref 150–400)
RBC: 3.03 MIL/uL — ABNORMAL LOW (ref 4.22–5.81)
RDW: 16.5 % — ABNORMAL HIGH (ref 11.5–15.5)
Smear Review: NORMAL
WBC: 14.7 10*3/uL — ABNORMAL HIGH (ref 4.0–10.5)
nRBC: 0.2 % (ref 0.0–0.2)

## 2020-01-06 LAB — BASIC METABOLIC PANEL
Anion gap: 8 (ref 5–15)
BUN: 74 mg/dL — ABNORMAL HIGH (ref 8–23)
CO2: 22 mmol/L (ref 22–32)
Calcium: 7.5 mg/dL — ABNORMAL LOW (ref 8.9–10.3)
Chloride: 102 mmol/L (ref 98–111)
Creatinine, Ser: 2.11 mg/dL — ABNORMAL HIGH (ref 0.61–1.24)
GFR calc Af Amer: 33 mL/min — ABNORMAL LOW (ref >60)
GFR calc non Af Amer: 28 mL/min — ABNORMAL LOW (ref >60)
Glucose, Bld: 195 mg/dL — ABNORMAL HIGH (ref 70–99)
Potassium: 4.3 mmol/L (ref 3.5–5.1)
Sodium: 132 mmol/L — ABNORMAL LOW (ref 135–145)

## 2020-01-06 LAB — GLUCOSE, CAPILLARY
Glucose-Capillary: 223 mg/dL — ABNORMAL HIGH (ref 70–99)
Glucose-Capillary: 200 mg/dL — ABNORMAL HIGH (ref 70–99)
Glucose-Capillary: 271 mg/dL — ABNORMAL HIGH (ref 70–99)
Glucose-Capillary: 195 mg/dL — ABNORMAL HIGH (ref 70–99)
Glucose-Capillary: 131 mg/dL — ABNORMAL HIGH (ref 70–99)

## 2020-01-06 LAB — MAGNESIUM: Magnesium: 2 mg/dL (ref 1.7–2.4)

## 2020-01-06 LAB — PROCALCITONIN: Procalcitonin: 0.37 ng/mL

## 2020-01-06 MED ORDER — HEPARIN SODIUM (PORCINE) 5000 UNIT/ML IJ SOLN
5000.0000 [IU] | Freq: Three times a day (TID) | INTRAMUSCULAR | Status: DC
  Administered 2020-01-06 – 2020-01-17 (×31): 5000 [IU] via SUBCUTANEOUS
  Filled 2020-01-06 (×33): qty 1

## 2020-01-06 MED ORDER — SODIUM CHLORIDE 0.9 % IV SOLN
3.0000 g | Freq: Two times a day (BID) | INTRAVENOUS | Status: DC
  Filled 2020-01-06: qty 8

## 2020-01-06 MED ORDER — CYANOCOBALAMIN 1000 MCG/ML IJ SOLN
1000.0000 ug | Freq: Once | INTRAMUSCULAR | Status: AC
  Administered 2020-01-06: 1000 ug via INTRAMUSCULAR
  Filled 2020-01-06: qty 1

## 2020-01-06 MED ORDER — STERILE WATER FOR INJECTION IV SOLN: INTRAVENOUS | Status: AC

## 2020-01-06 MED ORDER — VANCOMYCIN HCL 750 MG/150ML IV SOLN
750.0000 mg | INTRAVENOUS | Status: DC
  Filled 2020-01-06: qty 150

## 2020-01-06 MED ORDER — SODIUM CHLORIDE 0.9 % IV SOLN
3.0000 g | Freq: Two times a day (BID) | INTRAVENOUS | Status: DC
  Administered 2020-01-06: 3 g via INTRAVENOUS
  Filled 2020-01-06 (×2): qty 8

## 2020-01-06 MED ORDER — SODIUM CHLORIDE 0.9 % IV SOLN
300.0000 mg | Freq: Once | INTRAVENOUS | Status: AC
  Administered 2020-01-06: 300 mg via INTRAVENOUS
  Filled 2020-01-06: qty 15

## 2020-01-06 NOTE — Progress Notes (Signed)
Physical Therapy Treatment Patient Details Name: [name] MRN: [id number] DOB: 04-30-1941 Today's Date: 01/06/2020    History of Present Illness Pt is an 79 y/o M with PMH: PAD, CAD s/p CABG, Bradycardia s/p PPM (2021), HTN, DM II, HLD, CKD, BPH, urinary retention with chronic suprapubic catheter, and HTN. 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  MD assessment also includes: N&V, constipation, hyperglycemia, and microcytic anemia.    PT Comments    Pt ready for session.  Participated in exercises as described below.  To EOB with min guard and rail.  Generally steady in sitting.  Pt does report being uncomfortable with no back support but encouraged unsupported sitting for trunk strength and balance.  He is able to sit x 12 minutes but task generally fatigues him.  He does belch frequently during session holds emesis bag but no vomiting.  Discussed recovery and expected progressing to SNF for continued therapy.  Reviewed HEP and encouraged independent ex as tolerated.  Voiced understanding. He does report dizziness in sitting that seems to come in waves and related to head turns more than BP.  Will monitor and check orthostatics as appropriate.    Follow Up Recommendations  SNF     Equipment Recommendations  Rolling walker with 5" wheels    Recommendations for Other Services       Precautions / Restrictions Precautions Precautions: Fall Precaution Comments: Suprapubic catheter Restrictions Weight Bearing Restrictions: Yes LLE Weight Bearing: Non weight bearing Other Position/Activity Restrictions: s/p BKA    Mobility  Bed Mobility Overal bed mobility: Needs Assistance   Rolling: Min assist   Supine to sit: Min assist Sit to supine: Min assist      Transfers                 General transfer comment: deferred due to dizziness and  general fatigue  Ambulation/Gait             General Gait Details: Unable   Stairs             Wheelchair Mobility    Modified Rankin (Stroke Patients Only)       Balance Overall balance assessment: Needs assistance Sitting-balance support: Feet supported;Single extremity supported Sitting balance-Leahy Scale: Good Sitting balance - Comments: EOB x 12 minutes                                    Cognition Arousal/Alertness: Awake/alert Behavior During Therapy: WFL for tasks assessed/performed Overall Cognitive Status: Within Functional Limits for tasks assessed                                        Exercises Total Joint Exercises Ankle Circles/Pumps: AROM;Strengthening;10 reps;Right;5 reps Quad Sets: Strengthening;Both;10 reps;AROM;15 reps Gluteal Sets: Strengthening;Both;10 reps Heel Slides: AROM;Right;5 reps Hip ABduction/ADduction: Strengthening;Both;10 reps (with manual resistance) Straight Leg Raises: Strengthening;Both;10 reps (with manual resistance on the LLE) Long Arc Quad: Strengthening;Both;10 reps;AROM;15 reps Knee Flexion: Strengthening;Both;10 reps;15 reps;AROM    General Comments        Pertinent Vitals/Pain Pain Assessment: Faces Faces Pain Scale: Hurts a little bit Pain Location: generally less pain LLE and all over per pt Pain Descriptors / Indicators:  Sore Pain Intervention(s): Limited activity within patient's tolerance;Repositioned    Home Living                      Prior Function            PT Goals (current goals can now be found in the care plan section) Progress towards PT goals: Progressing toward goals    Frequency    7X/week      PT Plan      Co-evaluation              AM-PAC PT "6 Clicks" Mobility   Outcome Measure  Help needed turning from your back to your side while in a flat bed without using bedrails?: A Little Help needed moving from lying on your  back to sitting on the side of a flat bed without using bedrails?: A Little Help needed moving to and from a bed to a chair (including a wheelchair)?: A Lot Help needed standing up from a chair using your arms (e.g., wheelchair or bedside chair)?: A Lot Help needed to walk in hospital room?: Total Help needed climbing 3-5 steps with a railing? : Total 6 Click Score: 12    End of Session Equipment Utilized During Treatment: Gait belt Activity Tolerance: Patient tolerated treatment well Patient left: in bed;with call bell/phone within reach;with bed alarm set Nurse Communication: Mobility status PT Visit Diagnosis: Unsteadiness on feet (R26.81);Difficulty in walking, not elsewhere classified (R26.2);Muscle weakness (generalized) (M62.81);Pain Pain - Right/Left: Left Pain - part of body: Leg     Time: 5427-0623 PT Time Calculation (min) (ACUTE ONLY): 25 min  Charges:  $Gait Training: 8-22 mins $Therapeutic Exercise: 8-22 mins                    Chesley Noon, PTA 01/06/20, 1:17 PM

## 2020-01-06 NOTE — Progress Notes (Addendum)
PROGRESS NOTE    [name]  RCV:[id number] DOB: 10-31-1940 DOA: 01/12/2020 PCP: Olin Hauser, DO   Chief complaint.  Left leg pain.   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 Patient was started on IV vancomycin, Rocephin and Flagyl, podiatry and vascular surgeon were consulted. Patient had a angiogram and revascularization on 7/12. But the foot gangrene does not seem to be improving, as a result, planning for below-knee amputation.  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 7/16.Antibiotic changed to Unasyn in addition to vancomycin. 7/17. Developed hyperkalemia, most likely due to surgery. Given Kayexalate, sodium bicarb, calcium gluconate and additional sodium bicarb drip. Increased leukocytosis, probably from trauma. 7/18.  Condition improved.  Leukocytosis better.  Procalcitonin level trending down.  Renal function better.  Potassium normalized.  Completed antibiotics.   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 Potassium level normalized.  Renal function back to baseline.  Will discontinue IV fluids after current bag is completed.  2.  Left foot diabetic ulcer with cellulitis, gangrene secondary to  peripheral vessel disease. Patient has completed 3 days of IV antibiotics after surgery.  Discontinue antibiotics.  No evidence of additional infection.  3.  Deficient anemia with borderline B12 level. Give another dose IV iron, give B12 injection, check homocystine level.  4.  Essential hypertension.  Continue Norvasc.  5.  Reactive thrombocytosis.   Secondary to infection.  Follow.  6.  Type 2 diabetes. Glucose acceptable.  Continue current regimen.   DVT prophylaxis:Heparin Code Status:Full Family Communication:None at bedside Disposition Plan:  Patient came from:Home  Anticipated d/c place:Possible SNF  Barriers to d/c OR conditions which need to be met to effect a safe d/c:   Consultants:  Podiatry  Vascular surgery  Procedures:SP lower extremity angiogram, BKA. Antimicrobials: None    Subjective: Patient much improved today.  He had a multiple loose stools yesterday after given Kayexalate in addition to MiraLAX and senna.  Diarrhea seems better today. No abdominal pain or nausea vomiting. No short of breath or cough. No fever or chills. Has a chronic Foley catheter.  Objective: Vitals:   01/05/20 0802 01/05/20 1519 01/05/20 2327 01/06/20 0833  BP: 129/75 119/65 125/73 126/69  Pulse: 96 98 95 96  Resp: '18 16 16 17  '$ Temp: 98.6 F (37 C) 98.2 F (36.8 C) 98.2 F (36.8 C) 98.2 F (36.8 C)  TempSrc: Oral Oral Oral   SpO2: 95% 100% 99% 97%  Weight:      Height:        Intake/Output Summary (Last 24 hours) at 01/06/2020 0847 Last data filed at 01/06/2020 0500 Gross per 24 hour  Intake 854.66 ml  Output 700 ml  Net 154.66 ml   Filed Weights   12/27/2019 1533  Weight: 75.8 kg    Examination:  General exam: Appears  calm and comfortable  Respiratory system: Clear to auscultation. Respiratory effort  normal. Cardiovascular system: S1 & S2 heard, RRR. No JVD, murmurs, rubs, gallops or clicks. No pedal edema. Gastrointestinal system: Abdomen is nondistended, soft and nontender. No organomegaly or masses felt. Normal bowel sounds heard. Central nervous system: Alert and oriented. No focal neurological deficits. Extremities: Left BKA. Skin: No rashes, lesions or ulcers Psychiatry: Judgement and insight appear normal. Mood & affect appropriate.     Data Reviewed: I have personally reviewed following labs and imaging studies  CBC: Recent Labs  Lab 12/29/2019 0407 01/04/20 0346 01/05/20 0444 01/06/20 0513  WBC 19.9* 18.3* 22.4* 14.7*  NEUTROABS 17.5* 15.5* 16.9* 10.9*  HGB 10.2* 8.3* 8.7* 8.2*  HCT 30.8* 25.2* 26.4* 23.9*  MCV 78.2* 79.2* 78.3* 78.9*  PLT 403* 411* 542* 737*   Basic Metabolic Panel: Recent Labs  Lab 01/02/2020 0942 12/21/2019 0942 01/02/20 0733 12/25/2019 0407 01/04/20 0346 01/05/20 0444 01/05/20 1257 01/06/20 0513  NA  --   --   --  132* 131* 131*  --  132*  K  --   --   --  4.4 5.1 5.8* 4.5 4.3  CL  --   --   --  102 105 102  --  102  CO2  --   --   --  21* 20* 20*  --  22  GLUCOSE  --   --   --  99 210* 153*  --  195*  BUN 60*  --   --  69* 66* 78*  --  74*  CREATININE 2.16*   < > 2.21* 2.13* 2.05* 2.27*  --  2.11*  CALCIUM  --   --   --  8.1* 7.7* 8.1*  --  7.5*  MG  --   --   --  2.0 2.0 2.1  --  2.0   < > = values in this interval not displayed.   GFR: Estimated Creatinine Clearance: 29.4 mL/min (A) (by C-G formula based on SCr of 2.11 mg/dL (H)). Liver Function Tests: No results for input(s): AST, ALT, ALKPHOS, BILITOT, PROT, ALBUMIN in the last 168 hours. No results for input(s): LIPASE, AMYLASE in the last 168 hours. No results for input(s): AMMONIA in the last 168 hours. Coagulation Profile: Recent Labs  Lab 01/08/2020 0407  INR 1.2   Cardiac Enzymes: No results for input(s): CKTOTAL, CKMB, CKMBINDEX, TROPONINI in the last 168 hours. BNP  (last 3 results) No results for input(s): PROBNP in the last 8760 hours. HbA1C: No results for input(s): HGBA1C in the last 72 hours. CBG: Recent Labs  Lab 01/04/20 2146 01/05/20 0809 01/05/20 1157 01/05/20 1642 01/05/20 2103  GLUCAP 204* 152* 215* 198* 157*   Lipid Profile: No results for input(s): CHOL, HDL, LDLCALC, TRIG, CHOLHDL, LDLDIRECT in the last 72 hours. Thyroid Function Tests: No results for input(s): TSH, T4TOTAL, FREET4, T3FREE, THYROIDAB in the last 72 hours. Anemia Panel: No results for input(s): VITAMINB12, FOLATE, FERRITIN, TIBC, IRON, RETICCTPCT in the last 72 hours. Sepsis Labs: Recent Labs  Lab 01/05/20 0444 01/06/20 0513  PROCALCITON 0.48 0.37    Recent Results (from the past 240 hour(s))  Culture, blood (Routine X 2) w Reflex to ID Panel     Status: None   Collection Time: 12/27/19  7:01 PM   Specimen: BLOOD  Result Value Ref Range Status   Specimen Description BLOOD BLOOD RIGHT HAND  Final   Special Requests   Final    BOTTLES DRAWN  AEROBIC AND ANAEROBIC Blood Culture adequate volume   Culture   Final    NO GROWTH 5 DAYS Performed at [facility], [address] [place], [place] [address]    Report Status 12/27/2019 FINAL  Final  SARS Coronavirus 2 by RT PCR (hospital order, performed in [facility] hospital lab) Nasopharyngeal Nasopharyngeal Swab     Status: None   Collection Time: 12/27/19  8:45 PM   Specimen: Nasopharyngeal Swab  Result Value Ref Range Status   SARS Coronavirus 2 NEGATIVE NEGATIVE Final    Comment: (NOTE) SARS-CoV-2 target nucleic acids are NOT DETECTED.  The SARS-CoV-2 RNA is generally detectable in upper and lower respiratory specimens during the acute phase of infection. The lowest concentration of SARS-CoV-2 viral copies this assay can detect is 250 copies / mL. A negative result does not preclude SARS-CoV-2 infection and should not be used as the sole basis for treatment or other patient management  decisions.  A negative result may occur with improper specimen collection / handling, submission of specimen other than nasopharyngeal swab, presence of viral mutation(s) within the areas targeted by this assay, and inadequate number of viral copies (<250 copies / mL). A negative result must be combined with clinical observations, patient history, and epidemiological information.  Fact Sheet for Patients:   StrictlyIdeas.no  Fact Sheet for Healthcare Providers: BankingDealers.co.za  This test is not yet approved or  cleared by the [place] FDA and has been authorized for detection and/or diagnosis of SARS-CoV-2 by FDA under an Emergency Use Authorization (EUA).  This EUA will remain in effect (meaning this test can be used) for the duration of the COVID-19 declaration under Section 564(b)(1) of the Act, 21 U.S.C. section 360bbb-3(b)(1), unless the authorization is terminated or revoked sooner.  Performed at [facility], [address] [place], [place] [address]   MRSA PCR Screening     Status: None   Collection Time: 12/27/19  8:45 PM   Specimen: Nasopharyngeal  Result Value Ref Range Status   MRSA by PCR NEGATIVE NEGATIVE Final    Comment:        The GeneXpert MRSA Assay (FDA approved for NASAL specimens only), is one component of a comprehensive MRSA colonization surveillance program. It is not intended to diagnose MRSA infection nor to guide or monitor treatment for MRSA infections. Performed at [facility], [address] [place], [place] [address]          Radiology Studies: No results found.      Scheduled Meds: . amLODipine  10 mg Oral Daily  . aspirin EC  81 mg Oral Daily  . Chlorhexidine Gluconate Cloth  6 each Topical Daily  . ferrous sulfate  325 mg Oral Q breakfast  . fluticasone  2 spray Each Nare Daily  . gabapentin  100 mg Oral QHS  . heparin  5,000 Units Subcutaneous  Q8H  . insulin aspart  0-15 Units Subcutaneous TID WC  . insulin aspart  10 Units Subcutaneous TID WC  . insulin glargine  10 Units Subcutaneous Daily  . lidocaine  1 patch Transdermal Q24H  . loratadine  10 mg Oral Daily  . multivitamin with minerals  1 tablet Oral Daily  . nutrition supplement (JUVEN)  1 packet Oral BID BM  . pantoprazole  40 mg Oral Daily  . Ensure Max Protein  11 oz Oral Daily  . simvastatin  10 mg Oral QHS  . sodium chloride flush  3 mL Intravenous Once  .  tamsulosin  0.4 mg Oral Daily   Continuous Infusions: . sodium chloride 250 mL (01/05/20 0616)  . iron sucrose    .  sodium bicarbonate (isotonic) infusion in sterile water       LOS: 11 days    Time spent: 27 minutes    Sharen Hones, MD [facility]   To contact the attending provider between 7A-7P or the covering provider during after hours 7P-7A, please log into the web site [email or web address] and access using universal [place] password for that web site. If you do not have the password, please call the hospital operator.  01/06/2020, 8:47 AM

## 2020-01-06 NOTE — Progress Notes (Signed)
Subjective  - POD #3, s/p left BKA  Feels better today Worked with physical therapy   Physical Exam:  Dressing changed, stump is healing nicely.  The incision is intact without any significant drainage       Assessment/Plan:  POD #3  Doing well from vascular perspective.  Continue with PT OT  [name] 01/06/2020 3:56 PM --  Vitals:   01/06/20 0833 01/06/20 1502  BP: 126/69 134/78  Pulse: 96 89  Resp: 17 17  Temp: 98.2 F (36.8 C) (!) 97.5 F (36.4 C)  SpO2: 97% 97%    Intake/Output Summary (Last 24 hours) at 01/06/2020 1556 Last data filed at 01/06/2020 1350 Gross per 24 hour  Intake 1200.01 ml  Output 875 ml  Net 325.01 ml     Laboratory CBC    Component Value Date/Time   WBC 14.7 (H) 01/06/2020 0513   HGB 8.2 (L) 01/06/2020 0513   HGB 13.0 08/15/2013 0505   HCT 23.9 (L) 01/06/2020 0513   HCT 37.6 (L) 08/15/2013 0505   PLT 497 (H) 01/06/2020 0513   PLT 171 08/15/2013 0505    BMET    Component Value Date/Time   NA 132 (L) 01/06/2020 0513   NA 133 (L) 08/06/2015 1032   NA 130 (L) 08/15/2013 0505   K 4.3 01/06/2020 0513   K 4.7 08/15/2013 0505   CL 102 01/06/2020 0513   CL 100 08/15/2013 0505   CO2 22 01/06/2020 0513   CO2 25 08/15/2013 0505   GLUCOSE 195 (H) 01/06/2020 0513   GLUCOSE 215 (H) 08/15/2013 0505   BUN 74 (H) 01/06/2020 0513   BUN 24 08/06/2015 1032   BUN 34 (H) 08/15/2013 0505   CREATININE 2.11 (H) 01/06/2020 0513   CREATININE 1.70 (H) 08/15/2013 0505   CALCIUM 7.5 (L) 01/06/2020 0513   CALCIUM 8.7 08/15/2013 0505   GFRNONAA 28 (L) 01/06/2020 0513   GFRNONAA 39 (L) 08/15/2013 0505   GFRAA 33 (L) 01/06/2020 0513   GFRAA 45 (L) 08/15/2013 0505    COAG Lab Results  Component Value Date   INR 1.2 01/14/2020   No results found for: PTT  Antibiotics Anti-infectives (From admission, onward)   Start     Dose/Rate Route Frequency Ordered Stop   01/06/20 2100  vancomycin (VANCOREADY) IVPB 750 mg/150 mL  Status:   Discontinued        750 mg 150 mL/hr over 60 Minutes Intravenous Every 24 hours 01/06/20 0614 01/06/20 0847   01/06/20 0800  Ampicillin-Sulbactam (UNASYN) 3 g in sodium chloride 0.9 % 100 mL IVPB  Status:  Discontinued        3 g 200 mL/hr over 30 Minutes Intravenous Every 12 hours 01/06/20 0625 01/06/20 0847   01/06/20 0613  Ampicillin-Sulbactam (UNASYN) 3 g in sodium chloride 0.9 % 100 mL IVPB  Status:  Discontinued        3 g 200 mL/hr over 30 Minutes Intravenous Every 12 hours 01/06/20 0614 01/06/20 0625   01/04/20 1800  Ampicillin-Sulbactam (UNASYN) 3 g in sodium chloride 0.9 % 100 mL IVPB  Status:  Discontinued        3 g 200 mL/hr over 30 Minutes Intravenous Every 12 hours 01/04/20 0912 01/06/20 0614   01/08/2020 1008  ceFAZolin (ANCEF) 2-4 GM/100ML-% IVPB       Note to Pharmacy: [place] Reader   : cabinet override      12/25/2019 1008 01/02/2020 2214   12/20/2019 1007  levofloxacin (LEVAQUIN) 500 MG/100ML  IVPB  Status:  Discontinued       Note to Pharmacy: [place] Reader   : cabinet override      12/23/2019 1007 12/29/2019 1620   01/07/2020 1215  ceFAZolin (ANCEF) IVPB 1 g/50 mL premix        1 g 100 mL/hr over 30 Minutes Intravenous  Once 01/15/2020 1206 01/02/2020 1039   01/04/2020 1200  ceFAZolin (ANCEF) 1,000 mg in dextrose 5 % 100 mL IVPB  Status:  Discontinued        1,000 mg 220 mL/hr over 30 Minutes Intravenous  Once 01/13/2020 1159 12/28/2019 1205   01/06/2020 1000  ceFAZolin (ANCEF) IVPB 2g/100 mL premix  Status:  Discontinued        2 g 200 mL/hr over 30 Minutes Intravenous  Once 12/23/2019 0932 01/04/2020 1159   12/30/19 0830  metroNIDAZOLE (FLAGYL) IVPB 500 mg  Status:  Discontinued        500 mg 100 mL/hr over 60 Minutes Intravenous Every 8 hours 12/30/19 0822 01/04/20 0912   12/27/19 1900  vancomycin (VANCOCIN) IVPB 1000 mg/200 mL premix  Status:  Discontinued        1,000 mg 200 mL/hr over 60 Minutes Intravenous Every 24 hours 01/13/2020 2043 12/27/19 1217   12/27/19 1800  vancomycin  (VANCOREADY) IVPB 750 mg/150 mL  Status:  Discontinued        750 mg 150 mL/hr over 60 Minutes Intravenous Every 24 hours 12/27/19 1217 01/06/20 0614   01/15/2020 2200  metroNIDAZOLE (FLAGYL) tablet 500 mg  Status:  Discontinued        500 mg Oral Every 8 hours 01/03/2020 2009 12/30/19 0822   01/01/2020 2045  vancomycin (VANCOREADY) IVPB 500 mg/100 mL        500 mg 100 mL/hr over 60 Minutes Intravenous  Once 01/02/2020 2040 12/27/19 0000   01/02/2020 2015  cefTRIAXone (ROCEPHIN) 2 g in sodium chloride 0.9 % 100 mL IVPB  Status:  Discontinued        2 g 200 mL/hr over 30 Minutes Intravenous Every 24 hours 12/29/2019 2009 01/04/20 0912   01/09/2020 1815  vancomycin (VANCOCIN) IVPB 1000 mg/200 mL premix        1,000 mg 200 mL/hr over 60 Minutes Intravenous  Once 01/10/2020 1801 01/03/2020 2023   01/06/2020 1815  piperacillin-tazobactam (ZOSYN) IVPB 3.375 g        3.375 g 100 mL/hr over 30 Minutes Intravenous  Once 01/18/2020 1801 01/01/2020 1953       V. Leia Alf, M.D., [facility] [place] Office: [phone] Pager:  [phone]

## 2020-01-06 NOTE — Plan of Care (Signed)
  Problem: Education: Goal: Knowledge of General Education information will improve Description: Including pain rating scale, medication(s)/side effects and non-pharmacologic comfort measures Outcome: Progressing   Problem: Clinical Measurements: Goal: Will remain free from infection Outcome: Progressing   Problem: Activity: Goal: Risk for activity intolerance will decrease Outcome: Progressing   Problem: Elimination: Goal: Will not experience complications related to bowel motility Outcome: Progressing  Patient had BM yesterday Goal: Will not experience complications related to urinary retention Outcome: Progressing  Voiding without any difficulty   Problem: Pain Managment: Goal: General experience of comfort will improve Outcome: Progressing  Pain controlled with norco prn q4h

## 2020-01-07 DIAGNOSIS — E11621 Type 2 diabetes mellitus with foot ulcer: Secondary | ICD-10-CM | POA: Diagnosis not present

## 2020-01-07 DIAGNOSIS — L97523 Non-pressure chronic ulcer of other part of left foot with necrosis of muscle: Secondary | ICD-10-CM | POA: Diagnosis not present

## 2020-01-07 DIAGNOSIS — L97509 Non-pressure chronic ulcer of other part of unspecified foot with unspecified severity: Secondary | ICD-10-CM | POA: Diagnosis not present

## 2020-01-07 DIAGNOSIS — L03116 Cellulitis of left lower limb: Secondary | ICD-10-CM | POA: Diagnosis not present

## 2020-01-07 LAB — CBC WITH DIFFERENTIAL/PLATELET
Abs Immature Granulocytes: 1.06 10*3/uL — ABNORMAL HIGH (ref 0.00–0.07)
Basophils Absolute: 0.1 10*3/uL (ref 0.0–0.1)
Basophils Relative: 0 %
Eosinophils Absolute: 0.2 10*3/uL (ref 0.0–0.5)
Eosinophils Relative: 2 %
HCT: 23.9 % — ABNORMAL LOW (ref 39.0–52.0)
Hemoglobin: 8.1 g/dL — ABNORMAL LOW (ref 13.0–17.0)
Immature Granulocytes: 7 %
Lymphocytes Relative: 12 %
Lymphs Abs: 1.8 10*3/uL (ref 0.7–4.0)
MCH: 26.8 pg (ref 26.0–34.0)
MCHC: 33.9 g/dL (ref 30.0–36.0)
MCV: 79.1 fL — ABNORMAL LOW (ref 80.0–100.0)
Monocytes Absolute: 1 10*3/uL (ref 0.1–1.0)
Monocytes Relative: 6 %
Neutro Abs: 10.9 10*3/uL — ABNORMAL HIGH (ref 1.7–7.7)
Neutrophils Relative %: 73 %
Platelets: 550 10*3/uL — ABNORMAL HIGH (ref 150–400)
RBC: 3.02 MIL/uL — ABNORMAL LOW (ref 4.22–5.81)
RDW: 16.6 % — ABNORMAL HIGH (ref 11.5–15.5)
Smear Review: NORMAL
WBC: 15 10*3/uL — ABNORMAL HIGH (ref 4.0–10.5)
nRBC: 0.4 % — ABNORMAL HIGH (ref 0.0–0.2)

## 2020-01-07 LAB — BASIC METABOLIC PANEL
Anion gap: 6 (ref 5–15)
BUN: 78 mg/dL — ABNORMAL HIGH (ref 8–23)
CO2: 23 mmol/L (ref 22–32)
Calcium: 7.6 mg/dL — ABNORMAL LOW (ref 8.9–10.3)
Chloride: 102 mmol/L (ref 98–111)
Creatinine, Ser: 1.93 mg/dL — ABNORMAL HIGH (ref 0.61–1.24)
GFR calc Af Amer: 37 mL/min — ABNORMAL LOW (ref >60)
GFR calc non Af Amer: 32 mL/min — ABNORMAL LOW (ref >60)
Glucose, Bld: 60 mg/dL — ABNORMAL LOW (ref 70–99)
Potassium: 3.8 mmol/L (ref 3.5–5.1)
Sodium: 131 mmol/L — ABNORMAL LOW (ref 135–145)

## 2020-01-07 LAB — SURGICAL PATHOLOGY

## 2020-01-07 LAB — GLUCOSE, CAPILLARY
Glucose-Capillary: 71 mg/dL (ref 70–99)
Glucose-Capillary: 139 mg/dL — ABNORMAL HIGH (ref 70–99)
Glucose-Capillary: 269 mg/dL — ABNORMAL HIGH (ref 70–99)
Glucose-Capillary: 174 mg/dL — ABNORMAL HIGH (ref 70–99)
Glucose-Capillary: 67 mg/dL — ABNORMAL LOW (ref 70–99)
Glucose-Capillary: 85 mg/dL (ref 70–99)

## 2020-01-07 LAB — MAGNESIUM: Magnesium: 2 mg/dL (ref 1.7–2.4)

## 2020-01-07 MED ORDER — INSULIN GLARGINE 100 UNIT/ML ~~LOC~~ SOLN
16.0000 [IU] | Freq: Every day | SUBCUTANEOUS | Status: DC
  Filled 2020-01-07: qty 0.16

## 2020-01-07 MED ORDER — VITAMIN B-12 1000 MCG PO TABS
1000.0000 ug | ORAL_TABLET | Freq: Every day | ORAL | Status: DC
  Administered 2020-01-08 – 2020-01-10 (×2): 1000 ug via ORAL
  Filled 2020-01-07 (×4): qty 1

## 2020-01-07 NOTE — Progress Notes (Signed)
PROGRESS NOTE    [name]  TGY:[id number] DOB: Feb 16, 1941 DOA: 12/25/2019 PCP: Olin Hauser, DO   Chief complaint.  Left leg pain.   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 Patient was started on IV vancomycin, Rocephin and Flagyl, podiatry and vascular surgeon were consulted. Patient had a angiogram and revascularization on 7/12. But the foot gangrene does not seem to be improving, as a result, planning for below-knee amputation.  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 7/16.Antibiotic changed to Unasyn in addition to vancomycin. 7/17.Developed hyperkalemia, most likely due to surgery. Given Kayexalate, sodium bicarb, calcium gluconate and additional sodium bicarb drip. Increased leukocytosis, probably from trauma. 7/18.  Condition improved.  Leukocytosis better.  Procalcitonin level trending down.  Renal function better.  Potassium normalized.  Completed antibiotics.    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 Status post BKA.  Completed antibiotics.  Pending nursing home placement.  2.  Iron deficiency anemia with a borderline B12 level. Received IV iron  B12 injection, pending homocystine level.  Continue oral supplements.  3.  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 Renal function back to baseline.  4.  Reactive thrombocytosis. Continue to follow.  5.  Essential hypertension. Continue Norvasc.  6.  Uncontrolled type 2 diabetes with hyperglycemia. Glucose is running high again with improved appetite, increase Lantus dose.  DVT prophylaxis:Heparin Code Status:Full Family Communication:None at bedside Disposition Plan:  Patient came from:Home  Anticipated d/c place:Possible SNF  Barriers to d/c OR conditions which need to be met to effect a safe d/c:   Consultants:  Podiatry  Vascular surgery  Procedures:SP lower extremity angiogram, BKA. Antimicrobials: None    Subjective: Patient doing well today.  Pain uncontrolled.  Able to participate in physical therapy. Denies any short of breath or cough. No fever or chills. No nausea vomiting or diarrhea.  Objective: Vitals:   01/06/20 0833 01/06/20 1502 01/06/20 2333 01/07/20 0751  BP: 126/69 134/78 124/72 116/65  Pulse: 96 89 96 100  Resp: '17 17 16 18  '$ Temp: 98.2 F (36.8 C) (!) 97.5 F (36.4 C) 97.7 F (36.5 C) 97.9 F (36.6 C)  TempSrc:  Oral Oral Oral  SpO2: 97% 97% 100% 100%  Weight:      Height:        Intake/Output Summary (Last 24 hours) at 01/07/2020 1414 Last data filed at 01/07/2020 1048 Gross per 24 hour  Intake 360 ml  Output 400 ml  Net -40 ml   Filed Weights   12/31/2019 1533  Weight: 75.8 kg    Examination:  General exam: Appears calm and comfortable  Respiratory system: Clear to auscultation. Respiratory effort normal. Cardiovascular system: S1 & S2 heard, RRR. No JVD, murmurs, rubs, gallops or clicks. No pedal edema. Gastrointestinal system: Abdomen  is nondistended, soft and nontender. No  organomegaly or masses felt. Normal bowel sounds heard. Central nervous system: Alert and oriented. No focal neurological deficits. Extremities: Left BKA. Skin: No rashes, lesions or ulcers Psychiatry: Judgement and insight appear normal. Mood & affect appropriate.     Data Reviewed: I have personally reviewed following labs and imaging studies  CBC: Recent Labs  Lab 12/27/2019 0407 01/04/20 0346 01/05/20 0444 01/06/20 0513 01/07/20 0309  WBC 19.9* 18.3* 22.4* 14.7* 15.0*  NEUTROABS 17.5* 15.5* 16.9* 10.9* 10.9*  HGB 10.2* 8.3* 8.7* 8.2* 8.1*  HCT 30.8* 25.2* 26.4* 23.9* 23.9*  MCV 78.2* 79.2* 78.3* 78.9* 79.1*  PLT 403* 411* 542* 497* 341*   Basic Metabolic Panel: Recent Labs  Lab 01/09/2020 0407 01/07/2020 0407 01/04/20 0346 01/05/20 0444 01/05/20 1257 01/06/20 0513 01/07/20 0309  NA 132*  --  131* 131*  --  132* 131*  K 4.4   < > 5.1 5.8* 4.5 4.3 3.8  CL 102  --  105 102  --  102 102  CO2 21*  --  20* 20*  --  22 23  GLUCOSE 99  --  210* 153*  --  195* 60*  BUN 69*  --  66* 78*  --  74* 78*  CREATININE 2.13*  --  2.05* 2.27*  --  2.11* 1.93*  CALCIUM 8.1*  --  7.7* 8.1*  --  7.5* 7.6*  MG 2.0  --  2.0 2.1  --  2.0 2.0   < > = values in this interval not displayed.   GFR: Estimated Creatinine Clearance: 32.2 mL/min (A) (by C-G formula based on SCr of 1.93 mg/dL (H)). Liver Function Tests: No results for input(s): AST, ALT, ALKPHOS, BILITOT, PROT, ALBUMIN in the last 168 hours. No results for input(s): LIPASE, AMYLASE in the last 168 hours. No results for input(s): AMMONIA in the last 168 hours. Coagulation Profile: Recent Labs  Lab 01/06/2020 0407  INR 1.2   Cardiac Enzymes: No results for input(s): CKTOTAL, CKMB, CKMBINDEX, TROPONINI in the last 168 hours. BNP (last 3 results) No results for input(s): PROBNP in the last 8760 hours. HbA1C: No results for input(s): HGBA1C in the last 72 hours. CBG: Recent Labs  Lab 01/06/20 1628 01/06/20 2142  01/07/20 0436 01/07/20 0748 01/07/20 1216  GLUCAP 195* 131* 71 139* 269*   Lipid Profile: No results for input(s): CHOL, HDL, LDLCALC, TRIG, CHOLHDL, LDLDIRECT in the last 72 hours. Thyroid Function Tests: No results for input(s): TSH, T4TOTAL, FREET4, T3FREE, THYROIDAB in the last 72 hours. Anemia Panel: No results for input(s): VITAMINB12, FOLATE, FERRITIN, TIBC, IRON, RETICCTPCT in the last 72 hours. Sepsis Labs: Recent Labs  Lab 01/05/20 0444 01/06/20 0513  PROCALCITON 0.48 0.37    No results found for this or any previous visit (from the past 240 hour(s)).       Radiology Studies: No results found.      Scheduled Meds: . amLODipine  10 mg Oral Daily  . aspirin EC  81 mg Oral Daily  . Chlorhexidine Gluconate Cloth  6 each Topical Daily  . ferrous sulfate  325 mg Oral Q breakfast  . fluticasone  2 spray Each Nare Daily  . gabapentin  100 mg Oral QHS  . heparin  5,000 Units Subcutaneous Q8H  . insulin aspart  0-15 Units Subcutaneous TID WC  . insulin aspart  10 Units Subcutaneous TID WC  . [START ON 01/08/2020] insulin glargine  16 Units Subcutaneous Daily  . lidocaine  1 patch Transdermal Q24H  . loratadine  10 mg Oral Daily  . multivitamin with minerals  1 tablet Oral Daily  . nutrition supplement (JUVEN)  1 packet Oral BID BM  . pantoprazole  40 mg Oral Daily  . Ensure Max Protein  11 oz Oral Daily  . simvastatin  10 mg Oral QHS  . sodium chloride flush  3 mL Intravenous Once  . tamsulosin  0.4 mg Oral Daily   Continuous Infusions: . sodium chloride 250 mL (01/05/20 0616)     LOS: 12 days    Time spent: 26 minutes    Sharen Hones, MD [facility]   To contact the attending provider between 7A-7P or the covering provider during after hours 7P-7A, please log into the web site [email or web address] and access using universal [place] password for that web site. If you do not have the password, please call the hospital operator.  01/07/2020, 2:14  PM

## 2020-01-07 NOTE — Progress Notes (Signed)
Novolog 10 units not administered d/t patient feeling nauseated and presenting with poor oral intake this morning. Notified Dr. Roosevelt Locks

## 2020-01-07 NOTE — Progress Notes (Signed)
Some hypoglycemia this morning, pt able to tolerate juice and snack.

## 2020-01-07 NOTE — Progress Notes (Signed)
Occupational Therapy Treatment Patient Details Name: [name] MRN: [id number] DOB: 1940-06-26 Today's Date: 01/07/2020    History of present illness Pt is an 79 y/o M with PMH: PAD, CAD s/p CABG, Bradycardia s/p PPM (2021), HTN, DM II, HLD, CKD, BPH, urinary retention with chronic suprapubic catheter, and HTN. 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  MD assessment also includes: N&V, constipation, hyperglycemia, and microcytic anemia.   OT comments  Pt seen for CoTx this date with PTA to increase pt sitting tolerance/balance. Original plan was to attempt more ADL transfers/standing as well and d/t low fxl activity tolerance, PTA and OT opted for CoTx. However, pt presents too fatigued to stand this date with moderate eye closing t/o session and drowsiness requiring intermittent verbal/tactile cues to sustain attn. OT facilitates pt participation in below-listed aspects of self care (see ADL section). Pt fatigues quickly requiring MIN A with high reaching seated UB ADLs such as washing and combing hair. Pt performs sup<>sit transition with CGA, tolerates sitting x20 minutes while completing ADLs and below-listed exercise. In addition, pt completes bed level LB exercise with PTA. Pt tolerates well for his level of fatigue this date, but continues to require skilled OT d/t decreased tolerance for ADL transfers and seated self care. Anticipate that SNF continues to be most prudent d/c recommendation.    Follow Up Recommendations  SNF    Equipment Recommendations  Other (comment) (defer to next venue of care)    Recommendations for Other Services      Precautions / Restrictions Precautions Precautions: Fall Precaution Comments: Suprapubic catheter Restrictions Weight Bearing Restrictions: Yes LLE Weight Bearing: Non weight bearing Other Position/Activity  Restrictions: s/p L BKA       Mobility Bed Mobility Overal bed mobility: Needs Assistance Bed Mobility: Supine to Sit;Sit to Supine Rolling: Min assist   Supine to sit: Min guard Sit to supine: Min guard   General bed mobility comments: extended time, use of bed rails.  Transfers Overall transfer level: Needs assistance Equipment used: 1 person hand held assist Transfers: Lateral/Scoot Transfers          Lateral/Scoot Transfers: Min assist General transfer comment: 3 lateral scoots to his R to optimize positioning in prep for return to supine with MIN A. Pt with increased fatigue this date-standing transfers deferred.    Balance Overall balance assessment: Needs assistance Sitting-balance support: Feet supported;Single extremity supported Sitting balance-Leahy Scale: Fair Sitting balance - Comments: x 20 minutes.  initially required MIN A, but progressed to CGA/SBA w/ time in EOB sitting and improved squaring of hips to optimize positioning                                   ADL either performed or assessed with clinical judgement   ADL Overall ADL's : Needs assistance/impaired Eating/Feeding: Sitting;Supervision/ safety;Minimal assistance;Bed level Eating/Feeding Details (indicate cue type and reason): Pt demos decreased abillity to tear open condiment packaging. In addition, fatigues quickly with hand to mouth to eat spoonful of soup. 15% spillage w/o assist. Grooming: Brushing hair;Minimal assistance;Sitting Grooming Details (indicate cue type and reason): MIN A to thoroughly complete d/t tangling and weak UEs Upper Body Bathing: Minimal assistance;Sitting Upper Body Bathing Details (indicate cue type and reason): pt's UEs fatigue qucikly requiring assist to complete washing  hair in EOB sitting                                 Vision Baseline Vision/History: Wears glasses Patient Visual Report: No change from baseline     Perception      Praxis      Cognition Arousal/Alertness: Awake/alert;Lethargic Behavior During Therapy: WFL for tasks assessed/performed Overall Cognitive Status: Within Functional Limits for tasks assessed                                 General Comments: some increased drowsiness. Mostly appropriate. Some intermittent jumbled speech when pt more groggy/eyes closed. Speech more coherent when pt sitting EOB and more attentive/awake.        Exercises Other Exercises Other Exercises: OT facilitates pt particpation in straight arm raises x15 bilaterally and x15 cervical postural extensions to improve strength of extensors as it pertains to sitting and standing balance and fxl activity performance. Pt fatigues quickly requiring 2-3 min rest break between. Requires MIN visual cues from OT for form and pace.   Shoulder Instructions       General Comments      Pertinent Vitals/ Pain       Pain Assessment: Faces Faces Pain Scale: Hurts little more Pain Location: generally less pain LLE and all over per pt Pain Descriptors / Indicators: Sore;Aching Pain Intervention(s): Limited activity within patient's tolerance;Monitored during session;Repositioned  Home Living                                          Prior Functioning/Environment              Frequency  Min 2X/week        Progress Toward Goals  OT Goals(current goals can now be found in the care plan section)  Progress towards OT goals: Progressing toward goals  Acute Rehab OT Goals Patient Stated Goal: To get stronger and get back home OT Goal Formulation: With patient Time For Goal Achievement: 01/16/20 Potential to Achieve Goals: Good  Plan Discharge plan remains appropriate;Frequency remains appropriate    Co-evaluation    PT/OT/SLP Co-Evaluation/Treatment: Yes Reason for Co-Treatment: Complexity of the patient's impairments (multi-system involvement) PT goals addressed during session:  Mobility/safety with mobility;Balance OT goals addressed during session: ADL's and self-care;Strengthening/ROM      AM-PAC OT "6 Clicks" Daily Activity     Outcome Measure   Help from another person eating meals?: A Little Help from another person taking care of personal grooming?: A Little Help from another person toileting, which includes using toliet, bedpan, or urinal?: A Lot Help from another person bathing (including washing, rinsing, drying)?: A Lot Help from another person to put on and taking off regular upper body clothing?: A Little Help from another person to put on and taking off regular lower body clothing?: A Lot 6 Click Score: 15    End of Session    OT Visit Diagnosis: Other abnormalities of gait and mobility (R26.89);Muscle weakness (generalized) (M62.81)   Activity Tolerance Patient limited by fatigue;Patient limited by pain   Patient Left in bed;with call bell/phone within reach;with bed alarm set   Nurse Communication          Time: 1660-6301 OT Time Calculation (min): 47 min  Charges: OT General Charges $OT Visit: 1 Visit OT Treatments $Self Care/Home Management : 8-22 mins $Therapeutic Activity: 8-22 mins  Gerrianne Scale, MS, OTR/L ascom [phone] 01/07/20, 4:33 PM

## 2020-01-07 NOTE — Progress Notes (Signed)
Physical Therapy Treatment Patient Details Name: [name] MRN: [id number] DOB: 1941-06-14 Today's Date: 01/07/2020    History of Present Illness Pt is an 79 y/o M with PMH: PAD, CAD s/p CABG, Bradycardia s/p PPM (2021), HTN, DM II, HLD, CKD, BPH, urinary retention with chronic suprapubic catheter, and HTN. 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  MD assessment also includes: N&V, constipation, hyperglycemia, and microcytic anemia.  Co-tx with OT.  2 units OT, 1 PT per protocol.   PT Comments    Pt in bed asleep but awakens for session.  Participated in exercises as described below.  To EOB with rail and increased time and effort but able to do on his own with min guard/supervision.  Once sitting, he does struggle today and requires min a x 1 initially but able to progress to min guard.  Session focused on ADL tasks in sitting with OT.  See OT note for details.  Co-tx was initiated today in hopes to progress pt with standing and OOB activity but pt remains with general fatigue limiting progression of therapy.  He was able to sit for 20 minutes before fatigue.  Returned to supine with barrier cream to coccyx.  Pt with c/o general soreness and positioned on R side for pressure relief.    Follow Up Recommendations  SNF     Equipment Recommendations  Rolling walker with 5" wheels    Recommendations for Other Services       Precautions / Restrictions      Mobility  Bed Mobility Overal bed mobility: Needs Assistance Bed Mobility: Rolling Rolling: Min assist   Supine to sit: Min guard Sit to supine: Min guard   General bed mobility comments: rail needed but good effort with no actual physical assist needed.  Transfers Overall transfer level: Needs assistance Equipment used: 1 person hand held assist Transfers: Lateral/Scoot Transfers           Lateral/Scoot Transfers: Min assist General transfer comment: 3 small lateral scoots to rigth to repostiion in bed. standing deferred due to general fatigue  Ambulation/Gait             General Gait Details: Unable   Stairs             Wheelchair Mobility    Modified Rankin (Stroke Patients Only)       Balance Overall balance assessment: Needs assistance Sitting-balance support: Feet supported;Single extremity supported Sitting balance-Leahy Scale: Fair Sitting balance - Comments: x 20 minutes.  initially required min a x 1 then progressed to min gaurd with time.                                    Cognition Arousal/Alertness: Awake/alert;Lethargic Behavior During Therapy: WFL for tasks assessed/performed Overall Cognitive Status: Within Functional Limits for tasks assessed                                 General Comments: some increased drowsiness today but able to particiapte and engage approprietly      Exercises Other Exercises Other Exercises: supine AROM BLE x 10.  session focused on sitting balance activities.    General Comments        Pertinent Vitals/Pain  Pain Assessment: Faces Faces Pain Scale: Hurts little more Pain Location: generally less pain LLE and all over per pt Pain Descriptors / Indicators: Sore;Aching Pain Intervention(s): Limited activity within patient's tolerance;Monitored during session;Repositioned    Home Living                      Prior Function            PT Goals (current goals can now be found in the care plan section) Progress towards PT goals: Progressing toward goals    Frequency    7X/week      PT Plan Current plan remains appropriate    Co-evaluation PT/OT/SLP Co-Evaluation/Treatment: Yes Reason for Co-Treatment: Complexity of the patient's impairments (multi-system involvement) PT goals addressed during session: Mobility/safety with mobility;Balance OT goals  addressed during session: ADL's and self-care;Strengthening/ROM      AM-PAC PT "6 Clicks" Mobility   Outcome Measure  Help needed turning from your back to your side while in a flat bed without using bedrails?: A Little Help needed moving from lying on your back to sitting on the side of a flat bed without using bedrails?: A Little Help needed moving to and from a bed to a chair (including a wheelchair)?: Total Help needed standing up from a chair using your arms (e.g., wheelchair or bedside chair)?: Total Help needed to walk in hospital room?: Total   6 Click Score: 9    End of Session Equipment Utilized During Treatment: Gait belt Activity Tolerance: Patient tolerated treatment well Patient left: in bed;with call bell/phone within reach;with bed alarm set Nurse Communication: Mobility status Pain - Right/Left: Left Pain - part of body: Leg     Time: 2094-7096 PT Time Calculation (min) (ACUTE ONLY): 47 min  Charges:                       Chesley Noon, PTA 01/07/20, 2:32 PM

## 2020-01-07 NOTE — TOC Progression Note (Signed)
Transition of Care [facility]) - Progression Note    Patient Details  Name: [name] MRN: [id number] Date of Birth: Nov 30, 1940  Transition of [facility] CM/SW [place], RN Phone Number: 01/07/2020, 11:28 AM  Clinical Narrative:   Manuela Neptune and bedsearch completed, awaiting bed offer then will start insurance auth    Expected Discharge Plan: [place] Barriers to Discharge: Continued Medical Work up  Expected Discharge Plan and Services Expected Discharge Plan: [place]   Discharge Planning Services: CM Consult   Living arrangements for the past 2 months: Single Family Home                                       Social Determinants of Health (SDOH) Interventions    Readmission Risk Interventions No flowsheet data found.

## 2020-01-07 NOTE — Progress Notes (Signed)
Patient c/o feeling nauseated. Given PRN zofran BS Is 139 will give SSi with breakfast

## 2020-01-07 NOTE — NC FL2 (Signed)
[place] LEVEL OF CARE SCREENING TOOL     IDENTIFICATION  Patient Name: [name] Birthdate: May 27, 1941 Sex: male Admission Date (Current Location): 12/21/2019  [place] and [place] Number:  Engineering geologist and Address:  [facility], [address] [place], [place] [address]      Provider Number: [id number]  Attending Physician Name and Address:  Sharen Hones, MD  Relative Name and Phone Number:  Ginger daughter [phone]    Current Level of Care: Hospital Recommended Level of Care: [place] Prior Approval Number:    Date Approved/Denied:   PASRR Number: [id number]  Discharge Plan: SNF    Current Diagnoses: Patient Active Problem List   Diagnosis Date Noted  . 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 Cellulitis of left foot 12/27/2019  . Hyperlipidemia associated with type 2 diabetes mellitus ([place]) 01/01/2020  . Recurrent falls 02/14/2018  . CKD (chronic kidney disease), stage III 02/13/2018  . Gallbladder disease 09/05/2015  . Colon wall thickening 09/05/2015  . Diverticulosis 09/05/2015  . Abdominal mass 08/11/2015  . CHF (congestive heart failure) ([place]) 08/11/2015  . Urinary retention   . Phimosis   . Acute kidney failure ([place]) 07/29/2015  . Type 2 diabetes mellitus with foot ulcer ([place]) 02/11/2015  . Bradycardia 04/06/2013  . Coronary artery disease   . Hypertension associated with diabetes ([place])     Orientation RESPIRATION BLADDER Height & Weight     Self, Time, Situation, Place  Normal Continent Weight: 75.8 kg Height:  6\' 2"  (188 cm)  BEHAVIORAL SYMPTOMS/MOOD NEUROLOGICAL BOWEL NUTRITION STATUS      Continent Diet (carb modified)  AMBULATORY STATUS COMMUNICATION OF NEEDS Skin   Extensive Assist Verbally Surgical wounds                       Personal Care Assistance Level of Assistance  Bathing, Dressing Bathing Assistance: Limited assistance    Dressing Assistance: Limited assistance     Functional Limitations Info             SPECIAL CARE FACTORS FREQUENCY  PT (By licensed PT), OT (By licensed OT)     PT Frequency: 5 times per week OT Frequency: 5 times per week            Contractures Contractures Info: Not present    Additional Factors Info  Code Status, Allergies Code Status Info: full code Allergies Info: Ivp Dye Iodinated Diagnostic Agents, Other, Codeine           Current Medications (01/07/2020):  This is the current hospital active medication list Current Facility-Administered Medications  Medication Dose Route Frequency Provider Last Rate Last Admin  . 0.9 %  sodium chloride infusion   Intravenous PRN Sharen Hones, MD 10 mL/hr at 01/05/20 0616 250 mL at 01/05/20 0616  . acetaminophen (TYLENOL) tablet 650 mg  650 mg Oral Q6H PRN Algernon Huxley, MD   650 mg at 01/07/20 0740   Or  . acetaminophen (TYLENOL) suppository 650 mg  650 mg Rectal Q6H PRN Algernon Huxley, MD      . alum & mag hydroxide-simeth (MAALOX/MYLANTA) 200-200-20 MG/5ML suspension 15 mL  15 mL Oral Q6H PRN Algernon Huxley, MD   15 mL at 01/04/20 2303  . amLODipine (NORVASC) tablet 10 mg  10 mg Oral Daily Algernon Huxley, MD   10 mg at 01/07/20 0912  . aspirin EC tablet 81 mg  81 mg Oral Daily Algernon Huxley, MD   81 mg at 01/07/20 0912  . calcium carbonate (TUMS - dosed in mg elemental calcium) chewable tablet 200 mg of elemental calcium  1 tablet Oral TID PRN Algernon Huxley, MD   200 mg of elemental calcium at 12/29/2019 2255  . Chlorhexidine Gluconate Cloth 2 % PADS 6 each  6 each Topical Daily Algernon Huxley, MD   6 each at 01/06/20 0805  . docusate sodium (COLACE) capsule 100 mg  100 mg Oral BID PRN Algernon Huxley, MD   100 mg at 01/19/2020 1552  . ferrous sulfate tablet 325 mg  325 mg Oral Q breakfast Sharen Hones, MD   325 mg at 01/07/20 0742  . fluticasone (FLONASE) 50 MCG/ACT nasal spray 2 spray  2 spray Each Nare Daily Algernon Huxley, MD   2 spray at  01/05/20 0836  . gabapentin (NEURONTIN) capsule 100 mg  100 mg Oral QHS Algernon Huxley, MD   100 mg at 01/06/20 2109  . guaiFENesin-dextromethorphan (ROBITUSSIN DM) 100-10 MG/5ML syrup 10 mL  10 mL Oral Q6H PRN Algernon Huxley, MD      . heparin injection 5,000 Units  5,000 Units Subcutaneous Q8H Sharen Hones, MD   5,000 Units at 01/07/20 0500  . HYDROcodone-acetaminophen (NORCO/VICODIN) 5-325 MG per tablet 1-2 tablet  1-2 tablet Oral Q4H PRN Algernon Huxley, MD   2 tablet at 01/07/20 0912  . insulin aspart (novoLOG) injection 0-15 Units  0-15 Units Subcutaneous TID WC Algernon Huxley, MD   2 Units at 01/07/20 0813  . insulin aspart (novoLOG) injection 10 Units  10 Units Subcutaneous TID WC Algernon Huxley, MD   10 Units at 01/06/20 1648  . insulin glargine (LANTUS) injection 10 Units  10 Units Subcutaneous Daily Algernon Huxley, MD   10 Units at 01/07/20 0914  . lidocaine (LIDODERM) 5 % 1 patch  1 patch Transdermal Q24H Algernon Huxley, MD   1 patch at 01/02/20 1217  . loratadine (CLARITIN) tablet 10 mg  10 mg Oral Daily Algernon Huxley, MD   10 mg at 01/07/20 0912  . multivitamin with minerals tablet 1 tablet  1 tablet Oral Daily Algernon Huxley, MD   1 tablet at 01/07/20 0912  . nutrition supplement (JUVEN) (JUVEN) powder packet 1 packet  1 packet Oral BID BM Algernon Huxley, MD   1 packet at 01/06/20 1514  . ondansetron (ZOFRAN) tablet 4 mg  4 mg Oral Q6H PRN Algernon Huxley, MD   4 mg at 01/07/20 0740   Or  . ondansetron (ZOFRAN) injection 4 mg  4 mg Intravenous Q6H PRN Algernon Huxley, MD   4 mg at 01/05/20 1045  . pantoprazole (PROTONIX) EC tablet 40 mg  40 mg Oral Daily Algernon Huxley, MD   40 mg at 01/07/20 0912  . polyvinyl alcohol (LIQUIFILM TEARS) 1.4 % ophthalmic solution 2 drop  2 drop Both Eyes PRN Algernon Huxley, MD      . promethazine (PHENERGAN) injection 12.5 mg  12.5 mg Intravenous Q6H PRN Algernon Huxley, MD   12.5 mg at 01/07/20 0913  . protein supplement (ENSURE MAX) liquid  11 oz Oral Daily Algernon Huxley, MD   11 oz  at 01/06/20 0802  . simvastatin (ZOCOR) tablet 10 mg  10 mg Oral QHS Algernon Huxley, MD   10 mg at 01/06/20 2109  . sodium chloride flush (NS)  0.9 % injection 3 mL  3 mL Intravenous Once Algernon Huxley, MD      . sodium phosphate (FLEET) 7-19 GM/118ML enema 1 enema  1 enema Rectal Daily PRN Algernon Huxley, MD      . tamsulosin (FLOMAX) capsule 0.4 mg  0.4 mg Oral Daily Algernon Huxley, MD   0.4 mg at 01/07/20 4033     Discharge Medications: Please see discharge summary for a list of discharge medications.  Relevant Imaging Results:  Relevant Lab Results:   Additional Information SS# [id number]  Su Hilt, RN

## 2020-01-07 NOTE — TOC Progression Note (Signed)
Transition of Care [facility]) - Progression Note    Patient Details  Name: [name] MRN: [id number] Date of Birth: [birth year]  Transition of Care [facility]) CM/SW [place], RN Phone Number: 01/07/2020, 2:53 PM  Clinical Narrative:    Spoke with the patient to discuss Bed option and review star ratings, he chose the bed offer from [facility], I called [facility] and started the insurance process, Spoke with Colletta [place] I notified Tammy at [facility] of the bed choice, tre patient has had covid vaccines   Expected Discharge Plan: [place] Barriers to Discharge: Continued Medical Work up  Expected Discharge Plan and Services Expected Discharge Plan: [place]   Discharge Planning Services: CM Consult   Living arrangements for the past 2 months: Single Family Home                                       Social Determinants of Health (SDOH) Interventions    Readmission Risk Interventions No flowsheet data found.

## 2020-01-07 NOTE — Progress Notes (Signed)
Inpatient Diabetes Program Recommendations  AACE/ADA: New Consensus Statement on Inpatient Glycemic Control (2015)  Target Ranges:  Prepandial:   less than 140 mg/dL      [facility] postprandial:   less than 180 mg/dL (1-2 hours)      Critically ill patients:  140 - 180 mg/dL   Lab Results  Component Value Date   GLUCAP 139 (H) 01/07/2020   HGBA1C 12.0 (H) 12/20/2019    Review of Glycemic Control Results for MOROCCO, GIPE (MRN [id number]) as of 01/07/2020 09:40  Ref. Range 01/06/2020 08:13 01/06/2020 11:44 01/06/2020 16:28 01/06/2020 21:42 01/07/2020 04:36 01/07/2020 07:48  Glucose-Capillary Latest Ref Range: 70 - 99 mg/dL 200 (H) 271 (H) 195 (H) 131 (H) 71 139 (H)  Diabetes history:DM2 Outpatient Diabetes medications:Lantus 18 units daily Current orders for Inpatient glycemic control:Lantus10 units daily, Novolog10units TID with meals for meal coverage, Novolog 0-15 units TID with meals Inpatient Diabetes Program Recommendations:    Please reduce Novolog meal coverage to 3 units tid with meals (hold if patient eats less than 50% or NPO).   Thanks  Adah Perl, RN, BC-ADM Inpatient Diabetes Coordinator Pager [phone] (8a-5p)

## 2020-01-08 DIAGNOSIS — L97523 Non-pressure chronic ulcer of other part of left foot with necrosis of muscle: Secondary | ICD-10-CM | POA: Diagnosis not present

## 2020-01-08 DIAGNOSIS — G9341 Metabolic encephalopathy: Secondary | ICD-10-CM

## 2020-01-08 DIAGNOSIS — N1832 Chronic kidney disease, stage 3b: Secondary | ICD-10-CM | POA: Diagnosis not present

## 2020-01-08 LAB — GLUCOSE, CAPILLARY
Glucose-Capillary: 129 mg/dL — ABNORMAL HIGH (ref 70–99)
Glucose-Capillary: 276 mg/dL — ABNORMAL HIGH (ref 70–99)

## 2020-01-08 LAB — PROCALCITONIN: Procalcitonin: 0.33 ng/mL

## 2020-01-08 LAB — URINALYSIS, COMPLETE (UACMP) WITH MICROSCOPIC
Bilirubin Urine: NEGATIVE
Glucose, UA: NEGATIVE mg/dL
Ketones, ur: NEGATIVE mg/dL
Nitrite: NEGATIVE
Protein, ur: 100 mg/dL — AB
RBC / HPF: >50 RBC/hpf — ABNORMAL HIGH (ref 0–5)
Specific Gravity, Urine: 1.023 (ref 1.005–1.030)
WBC, UA: >50 WBC/hpf — ABNORMAL HIGH (ref 0–5)
pH: 5 (ref 5.0–8.0)

## 2020-01-08 LAB — HOMOCYSTEINE: Homocysteine: 17.6 umol/L (ref 0.0–21.3)

## 2020-01-08 MED ORDER — INSULIN GLARGINE 100 UNIT/ML ~~LOC~~ SOLN
10.0000 [IU] | Freq: Every day | SUBCUTANEOUS | Status: DC
  Filled 2020-01-08 (×2): qty 0.1

## 2020-01-08 MED ORDER — TRAMADOL HCL 50 MG PO TABS
50.0000 mg | ORAL_TABLET | Freq: Four times a day (QID) | ORAL | Status: DC | PRN
  Administered 2020-01-09: 50 mg via ORAL
  Filled 2020-01-08 (×2): qty 1

## 2020-01-08 MED ORDER — SODIUM CHLORIDE 0.9 % IV SOLN
1000.0000 mg | Freq: Two times a day (BID) | INTRAVENOUS | Status: DC
  Administered 2020-01-08 – 2020-01-09 (×2): 1000 mg via INTRAVENOUS
  Filled 2020-01-08 (×3): qty 1

## 2020-01-08 MED ORDER — SODIUM CHLORIDE 0.9 % IV SOLN: INTRAVENOUS | Status: DC

## 2020-01-08 NOTE — Progress Notes (Signed)
PROGRESS NOTE    [name]  GXQ:[id number] DOB: 1941-06-10 DOA: 12/22/2019 PCP: Olin Hauser, DO   Chief complaint.  Altered mental status.  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 Patient was started on IV vancomycin, Rocephin and Flagyl, podiatry and vascular surgeon were consulted. Patient had a angiogram and revascularization on 7/12. But the foot gangrene does not seem to be improving, as a result, planning for below-knee amputation.  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 7/16.Antibiotic changed to Unasyn in addition to vancomycin. 7/17.Developed hyperkalemia, most likely due to surgery. Given Kayexalate, sodium bicarb, calcium gluconate and additional sodium bicarb drip. Increased leukocytosis, probably from trauma. 7/18.Condition improved. Leukocytosis better. Procalcitonin level trending down. Renal functionbetter. Potassium normalized. Completed antibiotics. 7/20.  Patient has altered mental status today.  Sleepy and confused.  Suspect UTI secondary to suprapubic catheter.  Obtain stat UA and urine culture.  Also started meropenem for now until culture results available.   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  Acute metabolic  encephalopathy. 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  He also had some hypoglycemia yesterday, blood glucose is better, mental status still not better.  This would raise the possibility of a urinary tract infection secondary to suprapubic catheter.  Obtain stat UA and urine culture.  Due to recent broad-spectrum antibiotic usage, I will cover with meropenem for now.  Also start some IV fluids.  Currently, patient is afebrile.  Hemodynamically stable.  2.  Uncontrolled type 2 diabetes with hypoglycemia. Patient had some hypoglycemia yesterday, this could be due to infection.  Decrease insulin dose.  Hold off long-acting insulin today.  3.  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 Blood culture was negative.  Complete antibiotics.  Status post BKA.  4.  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 Renal function stable.  Recheck renal function again tomorrow.  5.  Essential hypertension. Continue Norvasc.  6.  Reactive thrombocytosis. Continue to follow.   DVT prophylaxis:Heparin Code Status:Full Family Communication:None at bedside Disposition Plan:  Patient came from:Home  Anticipated d/c place:Possible SNF  Barriers to d/c OR conditions which need to be met to effect a safe d/c:   Consultants:  Podiatry  Vascular surgery  Procedures:SP lower extremity angiogram, BKA. Antimicrobials: Meropenem.   Subjective: Patient started have confusion today.  Sleepy.  Denies any short of breath or cough. No diarrhea constipation.  No abdominal pain or nausea vomiting. He has suprapubic Foley catheter. No fever or chills.  Objective: Vitals:   01/08/20 0434 01/08/20 0749 01/08/20 0755 01/08/20 0759  BP: (!) 94/57 (!) 92/49 (!) 103/49  111/71  Pulse: 95 90 95 97  Resp:  _0 Temp:  98.6 F (37 C) 98.6 F (37 C) 98.4 F (36.9 C)  TempSrc:  SpO2: 100% 96% 99% 95%  Weight:      Height:        Intake/Output Summary (Last 24 hours) at 01/08/2020 1120 Last data filed at 01/08/2020 1037 Gross per 24 hour  Intake 180 ml  Output 875 ml  Net -695 ml   Filed Weights   01/07/2020 1533  Weight: 75.8 kg    Examination:  General exam: Appears calm and comfortable  Respiratory system: Clear to auscultation. Respiratory effort normal. Cardiovascular system: S1 & S2 heard, RRR. No JVD, murmurs, rubs, gallops or clicks. No pedal edema. Gastrointestinal system: Abdomen is nondistended, soft and nontender. No organomegaly or masses felt. Normal bowel sounds heard. Central nervous system: Drowsy with some disorientation.  No focal neurological deficits. Extremities: Symmetric  Skin: No rashes, lesions or ulcers Psychiatry:  Mood & affect appropriate.     Data Reviewed: I have personally reviewed following labs and imaging studies  CBC: Recent Labs  Lab 12/24/2019 0407 01/04/20 0346 01/05/20 0444 01/06/20 0513 01/07/20 0309  WBC 19.9* 18.3* 22.4* 14.7* 15.0*  NEUTROABS 17.5* 15.5* 16.9* 10.9* 10.9*  HGB 10.2* 8.3* 8.7* 8.2* 8.1*  HCT 30.8* 25.2* 26.4* 23.9* 23.9*  MCV 78.2* 79.2* 78.3* 78.9* 79.1*  PLT 403* 411* 542* 497* 202*   Basic Metabolic Panel: Recent Labs  Lab 01/14/2020 0407 12/27/2019 0407 01/04/20 0346 01/05/20 0444 01/05/20 1257 01/06/20 0513 01/07/20 0309  NA 132*  --  131* 131*  --  132* 131*  K 4.4   < > 5.1 5.8* 4.5 4.3 3.8  CL 102  --  105 102  --  102 102  CO2 21*  --  20* 20*  --  22 23  GLUCOSE 99  --  210* 153*  --  195* 60*  BUN 69*  --  66* 78*  --  74* 78*  CREATININE 2.13*  --  2.05* 2.27*  --  2.11* 1.93*  CALCIUM 8.1*  --  7.7* 8.1*  --  7.5* 7.6*  MG 2.0  --  2.0 2.1  --  2.0 2.0   < > = values in this interval not displayed.   GFR: Estimated Creatinine Clearance:  32.2 mL/min (A) (by C-G formula based on SCr of 1.93 mg/dL (H)). Liver Function Tests: No results for input(s): AST, ALT, ALKPHOS, BILITOT, PROT, ALBUMIN in the last 168 hours. No results for input(s): LIPASE, AMYLASE in the last 168 hours. No results for input(s): AMMONIA in the last 168 hours. Coagulation Profile: Recent Labs  Lab 12/24/2019 0407  INR 1.2   Cardiac Enzymes: No results for input(s): CKTOTAL, CKMB, CKMBINDEX, TROPONINI in the last 168 hours. BNP (last 3 results) No results for input(s): PROBNP in the last 8760 hours. HbA1C: No results for input(s): HGBA1C in the last 72 hours. CBG: Recent Labs  Lab 01/07/20 1216 01/07/20 1648 01/07/20 2122 01/07/20 2254 01/08/20 0757  GLUCAP 269* 174* 67* 85 129*   Lipid Profile: No results for input(s): CHOL, HDL, LDLCALC, TRIG, CHOLHDL, LDLDIRECT in the last 72 hours. Thyroid Function Tests: No results for input(s): TSH, T4TOTAL, FREET4, T3FREE, THYROIDAB in the last 72 hours. Anemia Panel: No results for input(s): VITAMINB12, FOLATE, FERRITIN, TIBC, IRON, RETICCTPCT in the last 72 hours. Sepsis Labs: Recent Labs  Lab 01/05/20 0444 01/06/20 0513 01/08/20 0854  PROCALCITON 0.48 0.37 0.33    No results found for this or any previous visit (from the past 240 hour(s)).       Radiology Studies: No results found.  Scheduled Meds: . amLODipine  10 mg Oral Daily  . aspirin EC  81 mg Oral Daily  . Chlorhexidine Gluconate Cloth  6 each Topical Daily  . ferrous sulfate  325 mg Oral Q breakfast  . fluticasone  2 spray Each Nare Daily  . gabapentin  100 mg Oral QHS  . heparin  5,000 Units Subcutaneous Q8H  . insulin aspart  0-15 Units Subcutaneous TID WC  . insulin glargine  10 Units Subcutaneous Daily  . lidocaine  1 patch Transdermal Q24H  . loratadine  10 mg Oral Daily  . multivitamin with minerals  1 tablet Oral Daily  . nutrition supplement (JUVEN)  1 packet Oral BID BM  . pantoprazole  40 mg Oral  Daily  . Ensure Max Protein  11 oz Oral Daily  . simvastatin  10 mg Oral QHS  . sodium chloride flush  3 mL Intravenous Once  . tamsulosin  0.4 mg Oral Daily  . vitamin B-12  1,000 mcg Oral Daily   Continuous Infusions: . sodium chloride 250 mL (01/05/20 0616)  . meropenem (MERREM) IV       LOS: 13 days    Time spent: 36 minutes    Sharen Hones, MD [facility]   To contact the attending provider between 7A-7P or the covering provider during after hours 7P-7A, please log into the web site [email or web address] and access using universal [place] password for that web site. If you do not have the password, please call the hospital operator.  01/08/2020, 11:20 AM

## 2020-01-08 NOTE — TOC Progression Note (Signed)
Transition of Care [facility]) - Progression Note    Patient Details  Name: [name] MRN: [id number] Date of Birth: [birth year]  Transition of Care [facility]) CM/SW [place], RN Phone Number: 01/08/2020, 10:42 AM  Clinical Narrative:    Clement Sayres and received approval information Auth number [id number], EMS auth approval number [id number], Notified the patient and the physician   Expected Discharge Plan: Skilled Nursing Facility Barriers to Discharge: Continued Medical Work up  Expected Discharge Plan and Services Expected Discharge Plan: [place]   Discharge Planning Services: CM Consult   Living arrangements for the past 2 months: Single Family Home                                       Social Determinants of Health (SDOH) Interventions    Readmission Risk Interventions No flowsheet data found.

## 2020-01-08 NOTE — Progress Notes (Signed)
Physical Therapy Treatment Patient Details Name: [name] MRN: [id number] DOB: 1941/04/30 Today's Date: 01/08/2020    History of Present Illness Pt is an 79 y/o M with PMH: PAD, CAD s/p CABG, Bradycardia s/p PPM (2021), HTN, DM II, HLD, CKD, BPH, urinary retention with chronic suprapubic catheter, and HTN. 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  MD assessment also includes: N&V, constipation, hyperglycemia, and microcytic anemia.    PT Comments    Pt was asleep side lying in bed upon arriving. He easily awakes and agrees to PT session. Reports pain in sacral region but reports leg feels not so bad. She was able to roll R to short sit EOB with increased time and min assist. Vcs for technique and sequencing throughout. Pt sat EOB x ~ 15 minutes while performing there ex at EOB. Encouraged to attempt standing however pt unwilling and reports," I'm just to weak right now to try." 3/5 quad strength grade with LAQ. Pt has sitting balance deficits with varied amount of assistance required to maintain sitting balance. Min assist at times.Once pt returned to long sitting in bed, issued amputee HEP and pt performed. See exercises listed below.  Overall pt limited by fatigue/weakness. He was repositioned in bed, quarter turned R to un-weight sacral region. RN notified of pt's abilities and concerns. He had bed alarm in place, call bell in reach, and LLE positioned to promote knee extension.    Follow Up Recommendations  SNF     Equipment Recommendations  Rolling walker with 5" wheels    Recommendations for Other Services       Precautions / Restrictions Precautions Precautions: Fall Precaution Comments: Suprapubic catheter Restrictions Weight Bearing Restrictions: Yes LLE Weight Bearing: Non weight bearing    Mobility  Bed Mobility Overal bed mobility:  Needs Assistance Bed Mobility: Supine to Sit;Sit to Supine Rolling: Min assist   Supine to sit: Min assist Sit to supine: Min guard   General bed mobility comments: extended time to perform, use of bed rails. Min assist to achieve EOB sitting. CGA to return to supine actrer sitting EOB x ~ 15 minutes.  Transfers                 General transfer comment: Pt sat EOB but was unwilling to trial standing 2/2 to weakness and fatigue. RLE strength 3-/5. Did perform HEP amputee handout   Ambulation/Gait                 Stairs             Wheelchair Mobility    Modified Rankin (Stroke Patients Only)       Balance Overall balance assessment: Needs assistance Sitting-balance support: Single extremity supported;Bilateral upper extremity supported Sitting balance-Leahy Scale: Fair Sitting balance - Comments: Sat EOB x 15 minutes with supervision mostly however several times required min assist to prevent fall while performing exercises       Standing balance comment: unwilling to trial                            Cognition Arousal/Alertness: Awake/alert;Lethargic Behavior During Therapy: WFL for tasks assessed/performed Overall Cognitive Status: Within Functional Limits for tasks assessed  General Comments: Pt is A and O x 3. disoriented to situation. per RN, cognition has improved from previous session.       Exercises Amputee Exercises Quad Sets: AROM;10 reps Gluteal Sets: AROM;10 reps Towel Squeeze: AROM;10 reps Hip Extension: AROM;10 reps Hip ABduction/ADduction: AROM;10 reps Knee Extension: Seated;10 reps;AROM Straight Leg Raises: AROM;10 reps    General Comments        Pertinent Vitals/Pain Pain Assessment: 0-10 Pain Score: 6  Faces Pain Scale: Hurts little more Pain Location: generally less pain LLE and all over per pt Pain Descriptors / Indicators: Sore;Aching Pain Intervention(s):  Limited activity within patient's tolerance;Monitored during session;Premedicated before session;Repositioned    Home Living                      Prior Function            PT Goals (current goals can now be found in the care plan section) Acute Rehab PT Goals Patient Stated Goal: To get stronger and get back home Progress towards PT goals: Progressing toward goals    Frequency    7X/week      PT Plan Current plan remains appropriate    Co-evaluation     PT goals addressed during session: Mobility/safety with mobility;Balance;Proper use of DME;Strengthening/ROM        AM-PAC PT "6 Clicks" Mobility   Outcome Measure  Help needed turning from your back to your side while in a flat bed without using bedrails?: A Little Help needed moving from lying on your back to sitting on the side of a flat bed without using bedrails?: A Little Help needed moving to and from a bed to a chair (including a wheelchair)?: Total Help needed standing up from a chair using your arms (e.g., wheelchair or bedside chair)?: Total Help needed to walk in hospital room?: Total Help needed climbing 3-5 steps with a railing? : Total 6 Click Score: 10    End of Session Equipment Utilized During Treatment: Gait belt Activity Tolerance: Patient limited by fatigue Patient left: in bed;with call bell/phone within reach;with bed alarm set Nurse Communication: Mobility status PT Visit Diagnosis: Unsteadiness on feet (R26.81);Difficulty in walking, not elsewhere classified (R26.2);Muscle weakness (generalized) (M62.81);Pain Pain - Right/Left: Left Pain - part of body: Leg     Time: 4680-3212 PT Time Calculation (min) (ACUTE ONLY): 28 min  Charges:  $Therapeutic Exercise: 8-22 mins $Therapeutic Activity: 8-22 mins                     Julaine Fusi PTA 01/08/20, 3:40 PM

## 2020-01-09 ENCOUNTER — Inpatient Hospital Stay: Payer: No Typology Code available for payment source

## 2020-01-09 DIAGNOSIS — L97523 Non-pressure chronic ulcer of other part of left foot with necrosis of muscle: Secondary | ICD-10-CM | POA: Diagnosis not present

## 2020-01-09 LAB — CBC WITH DIFFERENTIAL/PLATELET
Abs Immature Granulocytes: 0.69 10*3/uL — ABNORMAL HIGH (ref 0.00–0.07)
Basophils Absolute: 0.1 10*3/uL (ref 0.0–0.1)
Basophils Relative: 1 %
Eosinophils Absolute: 0.1 10*3/uL (ref 0.0–0.5)
Eosinophils Relative: 1 %
HCT: 25.7 % — ABNORMAL LOW (ref 39.0–52.0)
Hemoglobin: 8.5 g/dL — ABNORMAL LOW (ref 13.0–17.0)
Immature Granulocytes: 5 %
Lymphocytes Relative: 10 %
Lymphs Abs: 1.3 10*3/uL (ref 0.7–4.0)
MCH: 26.9 pg (ref 26.0–34.0)
MCHC: 33.1 g/dL (ref 30.0–36.0)
MCV: 81.3 fL (ref 80.0–100.0)
Monocytes Absolute: 0.8 10*3/uL (ref 0.1–1.0)
Monocytes Relative: 7 %
Neutro Abs: 9.9 10*3/uL — ABNORMAL HIGH (ref 1.7–7.7)
Neutrophils Relative %: 76 %
Platelets: 570 10*3/uL — ABNORMAL HIGH (ref 150–400)
RBC: 3.16 MIL/uL — ABNORMAL LOW (ref 4.22–5.81)
RDW: 18.2 % — ABNORMAL HIGH (ref 11.5–15.5)
WBC: 12.8 10*3/uL — ABNORMAL HIGH (ref 4.0–10.5)
nRBC: 0.5 % — ABNORMAL HIGH (ref 0.0–0.2)

## 2020-01-09 LAB — BASIC METABOLIC PANEL
Anion gap: 13 (ref 5–15)
BUN: 93 mg/dL — ABNORMAL HIGH (ref 8–23)
CO2: 19 mmol/L — ABNORMAL LOW (ref 22–32)
Calcium: 7.7 mg/dL — ABNORMAL LOW (ref 8.9–10.3)
Chloride: 95 mmol/L — ABNORMAL LOW (ref 98–111)
Creatinine, Ser: 2.81 mg/dL — ABNORMAL HIGH (ref 0.61–1.24)
GFR calc Af Amer: 23 mL/min — ABNORMAL LOW (ref >60)
GFR calc non Af Amer: 20 mL/min — ABNORMAL LOW (ref >60)
Glucose, Bld: 195 mg/dL — ABNORMAL HIGH (ref 70–99)
Potassium: 4.9 mmol/L (ref 3.5–5.1)
Sodium: 127 mmol/L — ABNORMAL LOW (ref 135–145)

## 2020-01-09 LAB — GLUCOSE, CAPILLARY
Glucose-Capillary: 184 mg/dL — ABNORMAL HIGH (ref 70–99)
Glucose-Capillary: 191 mg/dL — ABNORMAL HIGH (ref 70–99)
Glucose-Capillary: 205 mg/dL — ABNORMAL HIGH (ref 70–99)

## 2020-01-09 LAB — URINE CULTURE: Culture: >=100000 — AB

## 2020-01-09 LAB — MAGNESIUM: Magnesium: 2.1 mg/dL (ref 1.7–2.4)

## 2020-01-09 MED ORDER — HYDROMORPHONE HCL 1 MG/ML IJ SOLN
0.5000 mg | Freq: Once | INTRAMUSCULAR | Status: AC
  Administered 2020-01-09: 0.5 mg via INTRAVENOUS
  Filled 2020-01-09: qty 1

## 2020-01-09 MED ORDER — SIMETHICONE 80 MG PO CHEW
80.0000 mg | CHEWABLE_TABLET | Freq: Four times a day (QID) | ORAL | Status: DC
  Administered 2020-01-09 – 2020-01-13 (×10): 80 mg via ORAL
  Filled 2020-01-09 (×22): qty 1

## 2020-01-09 MED ORDER — FLUCONAZOLE 100MG IVPB: 100.0000 mg | INTRAVENOUS | Status: DC

## 2020-01-09 MED ORDER — OXYCODONE HCL 5 MG PO TABS
5.0000 mg | ORAL_TABLET | Freq: Four times a day (QID) | ORAL | Status: DC | PRN
  Administered 2020-01-10 – 2020-01-12 (×6): 5 mg via ORAL
  Filled 2020-01-09 (×7): qty 1

## 2020-01-09 MED ORDER — FUROSEMIDE 10 MG/ML IJ SOLN
20.0000 mg | Freq: Once | INTRAMUSCULAR | Status: AC
  Administered 2020-01-09: 20 mg via INTRAVENOUS
  Filled 2020-01-09: qty 4

## 2020-01-09 NOTE — Consult Note (Signed)
[facility] Nurse Consult Note: Reason for Consult:Consult requested for sacrum/buttocks.  Pt states; "I had a sore place there when I was admitted but it has gotten worse."   Wound type: Skin is red, moist and macerated, loose areas peeling at edges, revealing pink patchy areas of partial thickness skin loss.  Appearance is consistent with moisture associated skin damage, NOT a pressure injury.  The affected area is approx 3X3X.2cm and scattered across sacrum and upper buttocks. Dressing procedure/placement/frequency: Topical treatment orders provided for bedside nurses to perform to promote healing as follows: Turn patient off the affected area as often as possible. Foam dressing to sacrum/buttocks, change Q 3 days or PRN soiling. Please re-consult if further assistance is needed.  Thank-you,  Julien Girt MSN, [place], [place], [place], [place]

## 2020-01-09 NOTE — Progress Notes (Signed)
Inpatient Diabetes Program Recommendations  AACE/ADA: New Consensus Statement on Inpatient Glycemic Control (2015)  Target Ranges:  Prepandial:   less than 140 mg/dL      Peak postprandial:   less than 180 mg/dL (1-2 hours)      Critically ill patients:  140 - 180 mg/dL   Lab Results  Component Value Date   GLUCAP 276 (H) 01/08/2020   HGBA1C 12.0 (H) 01/15/2020    Review of Glycemic Control  Diabetes history: DM 2 Outpatient Diabetes medications: Glipizide 2.5 mg bid, Lantus 18 units Daily Current orders for Inpatient glycemic control:  Lantus 10 units Daily Novolog 0-15 units tid  Inpatient Diabetes Program Recommendations:    Noted Lantus dose held yesterday. Fasting glucose between 200-250 this am. Will watch on current regimen.  Thanks,  Tama Headings RN, MSN, BC-ADM Inpatient Diabetes Coordinator Team Pager [phone] (8a-5p)

## 2020-01-09 NOTE — Progress Notes (Signed)
Physical Therapy Treatment Patient Details Name: [name] MRN: [id number] DOB: Feb 28, 1941 Today's Date: 01/09/2020    History of Present Illness Pt is an 79 y/o M with PMH: PAD, CAD s/p CABG, Bradycardia s/p PPM (2021), HTN, DM II, HLD, CKD, BPH, urinary retention with chronic suprapubic catheter, and HTN. 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  MD assessment also includes: N&V, constipation, hyperglycemia, and microcytic anemia.    PT Comments    PT /OT co-treat 2/2 to pt's safety and requiring +2 assist for safety. Pt required mod assist to achieve EOB sitting. +2 max assist to stand EOB 2 x and vcs and encouragement throughout. He requires increased time for all task 2/2 to fatigue. Educated on proper leg positioning and that recovery doesn't happen over night. Pt verbalizes frustration and concerns and even becomes tearful about lack of independence. Therapist reassured pt that with continued exercise and hard work he will be able to be more Independent. Overall he tolerated session well but is limited by fatigue. PA entered room at conclusion of session to perform dressing change. Acute PT continues to recommend DC top SNF to address deficits with strength, endurance, and saf functional mobility.     Follow Up Recommendations  SNF     Equipment Recommendations  Rolling walker with 5" wheels    Recommendations for Other Services       Precautions / Restrictions Precautions Precautions: Fall Precaution Comments: Suprapubic catheter Restrictions Weight Bearing Restrictions: Yes LLE Weight Bearing: Non weight bearing Other Position/Activity Restrictions: s/p L BKA    Mobility  Bed Mobility Overal bed mobility: Needs Assistance Bed Mobility: Supine to Sit;Sit to Supine Rolling: Mod assist Sidelying to sit: Mod assist Supine to sit: Min  assist Sit to supine: Mod assist   General bed mobility comments: extended time to perform, use of bed rails. Min assist to achieve EOB sitting.  Transfers Overall transfer level: Needs assistance Equipment used: Rolling walker (2 wheeled) Transfers: Lateral/Scoot Transfers Sit to Stand: From elevated surface;+2 physical assistance;+2 safety/equipment;Max assist        Lateral/Scoot Transfers: Mod assist General transfer comment: Pt trials standing x2 this date. He requires MAX +2 to come to stand and maintain upright position with heavy UE use on RW.  Ambulation/Gait             General Gait Details: unsafe to progress from EOB   Stairs             Wheelchair Mobility    Modified Rankin (Stroke Patients Only)       Balance Overall balance assessment: Needs assistance Sitting-balance support: Single extremity supported;Bilateral upper extremity supported;Feet unsupported (LLE supported) Sitting balance-Leahy Scale: Fair Sitting balance - Comments: Steady static sitting at EOB. Requires occasional min guard when performing dynamic tasks such as scooting.   Standing balance support: During functional activity Standing balance-Leahy Scale: Zero Standing balance comment: Requires max assist to maintain upright position with heavy BUE use on RW.                            Cognition Arousal/Alertness: Awake/alert Behavior During Therapy: WFL for tasks assessed/performed Overall Cognitive Status: Within Functional Limits for tasks assessed  General Comments: Pt generally appropriate/oriented t/o session. Follows VCs with min cueing/increased time.      Exercises Other Exercises Other Exercises: Ot facilitates review of prior education on residual limb management techniques and mobility attempts this date. Pt also educated on falls prevention strategies, bed mobility techniques, and safe transfer  strategies.    General Comments General comments (skin integrity, edema, etc.): Wound dressing intact at start/end of session.      Pertinent Vitals/Pain Pain Assessment: Faces Pain Score: 6  Faces Pain Scale: Hurts even more Pain Location: Abdominal pain, and LLE pain with mobility. Pain Descriptors / Indicators: Grimacing;Sore;Aching;Guarding Pain Intervention(s): Limited activity within patient's tolerance;Monitored during session;Repositioned    Home Living                      Prior Function            PT Goals (current goals can now be found in the care plan section) Acute Rehab PT Goals Patient Stated Goal: To get stronger and get back home Progress towards PT goals: Progressing toward goals    Frequency    7X/week      PT Plan Current plan remains appropriate    Co-evaluation   Reason for Co-Treatment: Complexity of the patient's impairments (multi-system involvement);For patient/therapist safety;To address functional/ADL transfers PT goals addressed during session: Mobility/safety with mobility;Balance;Proper use of DME;Strengthening/ROM OT goals addressed during session: ADL's and self-care;Proper use of Adaptive equipment and DME      AM-PAC PT "6 Clicks" Mobility   Outcome Measure  Help needed turning from your back to your side while in a flat bed without using bedrails?: A Little Help needed moving from lying on your back to sitting on the side of a flat bed without using bedrails?: A Little Help needed moving to and from a bed to a chair (including a wheelchair)?: Total Help needed standing up from a chair using your arms (e.g., wheelchair or bedside chair)?: Total Help needed to walk in hospital room?: Total Help needed climbing 3-5 steps with a railing? : Total 6 Click Score: 10    End of Session Equipment Utilized During Treatment: Gait belt Activity Tolerance: Patient limited by fatigue Patient left: in bed;with call bell/phone  within reach;with bed alarm set Nurse Communication: Mobility status PT Visit Diagnosis: Unsteadiness on feet (R26.81);Difficulty in walking, not elsewhere classified (R26.2);Muscle weakness (generalized) (M62.81);Pain Pain - Right/Left: Left Pain - part of body: Leg     Time: 3846-6599 PT Time Calculation (min) (ACUTE ONLY): 48 min  Charges:  $Therapeutic Activity: 23-37 mins                     Julaine Fusi PTA 01/09/20, 4:47 PM

## 2020-01-09 NOTE — Progress Notes (Signed)
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 Patient was started on IV vancomycin, Rocephin and Flagyl, podiatry and vascular surgeon were consulted. Patient had a angiogram and revascularization on 7/12. But the foot gangrene does not seem to be improving, as a result, planning for below-knee amputation.  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 7/16.Antibiotic changed to Unasyn in addition to vancomycin. 7/17.Developed hyperkalemia, most likely due to surgery. Given Kayexalate, sodium bicarb, calcium gluconate and additional sodium bicarb drip. Increased leukocytosis, probably from trauma. 7/18.Condition improved. Leukocytosis better. Procalcitonin level trending down. Renal functionbetter. Potassium normalized. Completed antibiotics. 7/20.  Patient has altered mental status today.  Sleepy and confused.  Suspect UTI secondary to suprapubic catheter.  Obtain stat UA and urine culture.  Also started meropenem for now until culture results available.    Consultants:  Vascular, wound nursing Procedures:   Antimicrobials:      Subjective: Pt states it "would be nice if he has visitors after he finishes his food". Denies cp, sob, dizziness. Or any other complaints. Most of the time has his eyes closed during exam  Objective: Vitals:   01/08/20 1254 01/08/20 1426 01/08/20 2323 01/09/20 0741  BP: 108/67 123/69 112/62 101/68  Pulse: 89 94 92 89  Resp:  18 (!) 22 16  Temp: (!) 97.5 F (36.4 C)  98.1 F (36.7 C) 97.7 F (36.5 C) 98 F (36.7 C)  TempSrc: Axillary Oral  Oral  SpO2:  100% 93% 99%  Weight:      Height:        Intake/Output Summary (Last 24 hours) at 01/09/2020 1555 Last data filed at 01/09/2020 0900 Gross per 24 hour  Intake 80 ml  Output 325 ml  Net -245 ml   Filed Weights   01/13/2020 1533  Weight: 75.8 kg    Examination:  General exam: Appears calm and comfortable , in bed Respiratory system: cta with poor resp efford Cardiovascular system: S1 & S2 heard, RRR. + JVD, no murmurs, rubs, gallops or clicks. GU: scrotal edema Gastrointestinal system: Abdomen is nondistended, soft and nontender. Normal bowel sounds heard. Central nervous system: Alert and oriented x3 . Difficult to do full neuro exam Extremities:+edema. Lt BKA Skin: Warm dry Psychiatry:Mood & affect appropriate in current setting.     Data Reviewed: I have personally reviewed following labs and imaging studies  CBC: Recent Labs  Lab 01/04/20 0346 01/05/20 0444 01/06/20 0513 01/07/20 0309 01/09/20 0446  WBC 18.3* 22.4* 14.7* 15.0* 12.8*  NEUTROABS 15.5* 16.9* 10.9* 10.9* 9.9*  HGB 8.3* 8.7* 8.2* 8.1* 8.5*  HCT 25.2* 26.4* 23.9* 23.9* 25.7*  MCV 79.2* 78.3* 78.9* 79.1* 81.3  PLT 411* 542* 497* 550* 166*   Basic Metabolic Panel: Recent Labs  Lab 01/04/20 0346 01/04/20 0346 01/05/20 0444 01/05/20 1257 01/06/20 0513 01/07/20 0309 01/09/20 0446  NA 131*  --  131*  --  132* 131* 127*  K 5.1   < > 5.8* 4.5 4.3 3.8 4.9  CL 105  --  102  --  102 102 95*  CO2 20*  --  20*  --  22 23 19*  GLUCOSE 210*  --  153*  --  195* 60* 195*  BUN 66*  --  78*  --  74* 78* 93*  CREATININE 2.05*  --  2.27*  --  2.11* 1.93* 2.81*  CALCIUM 7.7*  --  8.1*  --  7.5* 7.6* 7.7*  MG 2.0  --  2.1  --  2.0 2.0 2.1   < > = values in this interval not displayed.   GFR: Estimated Creatinine Clearance: 22.1 mL/min (A) (by C-G formula based on SCr of 2.81 mg/dL (H)). Liver Function Tests: No  results for input(s): AST, ALT, ALKPHOS, BILITOT, PROT, ALBUMIN in the last 168 hours. No results for input(s): LIPASE, AMYLASE in the last 168 hours. No results for input(s): AMMONIA in the last 168 hours. Coagulation Profile: Recent Labs  Lab 01/16/2020 0407  INR 1.2   Cardiac Enzymes: No results for input(s): CKTOTAL, CKMB, CKMBINDEX, TROPONINI in the last 168 hours. BNP (last 3 results) No results for input(s): PROBNP in the last 8760 hours. HbA1C: No results for input(s): HGBA1C in the last 72 hours. CBG: Recent Labs  Lab 01/07/20 2122 01/07/20 2254 01/08/20 0757 01/08/20 1638 01/09/20 1137  GLUCAP 67* 85 129* 276* 184*   Lipid Profile: No results for input(s): CHOL, HDL, LDLCALC, TRIG, CHOLHDL, LDLDIRECT in the last 72 hours. Thyroid Function Tests: No results for input(s): TSH, T4TOTAL, FREET4, T3FREE, THYROIDAB in the last 72 hours. Anemia Panel: No results for input(s): VITAMINB12, FOLATE, FERRITIN, TIBC, IRON, RETICCTPCT in the last 72 hours. Sepsis Labs: Recent Labs  Lab 01/05/20 0444 01/06/20 0513 01/08/20 0854  PROCALCITON 0.48 0.37 0.33    Recent Results (from the past 240 hour(s))  Urine Culture     Status: Abnormal   Collection Time: 01/08/20 10:45 AM   Specimen: Urine, Random  Result Value Ref Range Status   Specimen Description   Final    URINE, RANDOM Performed at [facility], [address], [place], [place] [address]    Special Requests   Final    NONE Performed at [facility], [address] [place], [place] [address]    Culture >=100,000 COLONIES/mL YEAST (A)  Final   Report Status 01/09/2020 FINAL  Final         Radiology Studies: DG Abd 1 View  Result Date: 01/09/2020 CLINICAL DATA:  Vomiting and diarrhea for few days, abdominal pain EXAM: ABDOMEN - 1 VIEW COMPARISON:  12/29/2019 FINDINGS: Nonobstructive bowel gas pattern. Scattered stool in colon. No bowel dilatation or bowel wall thickening. Gaseous  distention of stomach increased from previous exam. Bones demineralized with degenerative changes of lumbar spine and BILATERAL hip joints. Extensive atherosclerotic calcifications. Pacemaker leads RIGHT atrium, RIGHT ventricle, and coronary sinus. IMPRESSION: Increased gaseous distention of stomach. Nonobstructive bowel gas pattern. Aortic Atherosclerosis (ICD10-I70.0). Electronically Signed   By: Lavonia Dana M.D.   On: 01/09/2020 10:25        Scheduled Meds: . amLODipine  10 mg Oral Daily  . aspirin EC  81 mg Oral Daily  . Chlorhexidine Gluconate Cloth  6 each Topical Daily  . ferrous sulfate  325 mg Oral Q breakfast  . fluticasone  2 spray Each Nare Daily  . gabapentin  100 mg Oral QHS  . heparin  5,000 Units Subcutaneous Q8H  . insulin aspart  0-15 Units Subcutaneous TID WC  . lidocaine  1 patch Transdermal Q24H  . loratadine  10 mg Oral Daily  . multivitamin with minerals  1 tablet Oral  Daily  . nutrition supplement (JUVEN)  1 packet Oral BID BM  . pantoprazole  40 mg Oral Daily  . Ensure Max Protein  11 oz Oral Daily  . simethicone  80 mg Oral QID  . simvastatin  10 mg Oral QHS  . sodium chloride flush  3 mL Intravenous Once  . tamsulosin  0.4 mg Oral Daily  . vitamin B-12  1,000 mcg Oral Daily   Continuous Infusions: . 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  Acute metabolic encephalopathy. 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  He also had some hypoglycemia , Appears MS improving this am.  D/c'd lantus, just keep on RISS ucx  +yeast, but no need to treat  D/c ivf as he appears vol. Overloaded-will give lasix 20mg  iv x1  2.  Uncontrolled type 2 diabetes with hypoglycemia. Patient had some hypoglycemia  D/c lantus Ck fs RISS only   3.  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 Blood culture was negative.  Complete antibiotics. S/p BKA Daily dressing changes to stump vascluar following, healing well PT/OT -rec. SNF pending  4.  acute /Chronic kidney disease stage IIIb with hyperkalemia. Suprapubic cath checked per nsg working. Possibly due to hypoperfusion from volume overload as on exam appears volume overloaded Will give lasix 20mg  iv x1, if no improvement will consult nephrology in am Avoid nephrotoxin drugs  5.  Essential hypertension. Will d/c norvasc since bp on low side  6.  Reactive thrombocytosis. Continue to follow.  DVT prophylaxis: Heparin Code Status: Full Family Communication: None Disposition Plan: SNF pending Status is: Inpatient  Remains inpatient appropriate because:IV treatments appropriate due to intensity of illness or inability to take PO   Dispo: The patient is from: Home              Anticipated d/c is to: SNF              Anticipated d/c date is: 2 days              Patient currently is not medically stable to d/c.Pt requiring iv diuresis, MS improvement prior to dc            LOS: 14 days   Time spent: 45 min with >50% on coc    Nolberto Hanlon, MD [facility] Pager [phone]  If 7PM-7AM, please contact night-coverage [email or web address] Password [facility] 01/09/2020, 3:55 PM

## 2020-01-09 NOTE — Progress Notes (Signed)
Received report from [employer], [place]. Patient Alert and Oriented x 4 and showing no signs of distress. Call bell in reach. Assessment to follow

## 2020-01-09 NOTE — Plan of Care (Signed)
Pt continues to complain of persistent nausea and distention. Pt states he will try laxative to help with distention today.  Problem: Education: Goal: Knowledge of General Education information will improve Description: Including pain rating scale, medication(s)/side effects and non-pharmacologic comfort measures Outcome: Progressing   Problem: Clinical Measurements: Goal: Will remain free from infection Outcome: Progressing   Problem: Activity: Goal: Risk for activity intolerance will decrease Outcome: Progressing   Problem: Elimination: Goal: Will not experience complications related to urinary retention Outcome: Progressing   Problem: Safety: Goal: Ability to remain free from injury will improve Outcome: Progressing   Problem: Skin Integrity: Goal: Risk for impaired skin integrity will decrease Outcome: Progressing   Problem: Elimination: Goal: Will not experience complications related to bowel motility Outcome: Not Progressing   Problem: Pain Managment: Goal: General experience of comfort will improve Outcome: Not Progressing

## 2020-01-09 NOTE — Progress Notes (Signed)
[facility] Daily Progress Note   Subjective: 01/12/2020: Left below-the-knee amputation  Patient just finished working with occupational therapy.  States he is "tired".  Objective: Vitals:   01/08/20 1254 01/08/20 1426 01/08/20 2323 01/09/20 0741  BP: 108/67 123/69 112/62 101/68  Pulse: 89 94 92 89  Resp:  18 (!) 22 16  Temp: (!) 97.5 F (36.4 C) 98.1 F (36.7 C) 97.7 F (36.5 C) 98 F (36.7 C)  TempSrc: Axillary Oral  Oral  SpO2:  100% 93% 99%  Weight:      Height:        Intake/Output Summary (Last 24 hours) at 01/09/2020 1501 Last data filed at 01/09/2020 0900 Gross per 24 hour  Intake 80 ml  Output 325 ml  Net -245 ml   Physical Exam: A&Ox3, NAD CV: RRR Pulmonary: CTA Bilaterally Abdomen: Soft, Nontender, Nondistended Vascular:  Left lower extremity: Thigh soft.  Knee flexible joint.  Stump is healthy.  Staple line is clean dry intact.  Healing well.   Laboratory: CBC    Component Value Date/Time   WBC 12.8 (H) 01/09/2020 0446   HGB 8.5 (L) 01/09/2020 0446   HGB 13.0 08/15/2013 0505   HCT 25.7 (L) 01/09/2020 0446   HCT 37.6 (L) 08/15/2013 0505   PLT 570 (H) 01/09/2020 0446   PLT 171 08/15/2013 0505   BMET    Component Value Date/Time   NA 127 (L) 01/09/2020 0446   NA 133 (L) 08/06/2015 1032   NA 130 (L) 08/15/2013 0505   K 4.9 01/09/2020 0446   K 4.7 08/15/2013 0505   CL 95 (L) 01/09/2020 0446   CL 100 08/15/2013 0505   CO2 19 (L) 01/09/2020 0446   CO2 25 08/15/2013 0505   GLUCOSE 195 (H) 01/09/2020 0446   GLUCOSE 215 (H) 08/15/2013 0505   BUN 93 (H) 01/09/2020 0446   BUN 24 08/06/2015 1032   BUN 34 (H) 08/15/2013 0505   CREATININE 2.81 (H) 01/09/2020 0446   CREATININE 1.70 (H) 08/15/2013 0505   CALCIUM 7.7 (L) 01/09/2020 0446   CALCIUM 8.7 08/15/2013 0505   GFRNONAA 20 (L) 01/09/2020 0446   GFRNONAA 39 (L) 08/15/2013 0505   GFRAA 23 (L) 01/09/2020 0446   GFRAA 45 (L) 08/15/2013 0505   Assessment/Planning: The  patient is an 79 year old male status post left below the knee amputation (12/25/2019)  1) left below the knee amputation: Stump is healthy.  Staple line is clean dry and intact.  Healing well. Daily dressing changes to stump. Continue to work with physical therapy, Occupational Therapy Recommend discharge to SNF  Discussed with Dr. [place] Parents Venna Berberich PA-C 01/09/2020 3:01 PM

## 2020-01-09 NOTE — Progress Notes (Signed)
Occupational Therapy Treatment Patient Details Name: [name] MRN: [id number] DOB: [birth year] Today's Date: 01/09/2020    History of present illness Pt is an 79 y/o M with PMH: PAD, CAD s/p CABG, Bradycardia s/p PPM (2021), HTN, DM II, HLD, CKD, BPH, urinary retention with chronic suprapubic catheter, and HTN. 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  MD assessment also includes: N&V, constipation, hyperglycemia, and microcytic anemia.   OT comments  [name] was seen for OT/PT co-treatment on this date. Upon arrival to room pt awake/alert, semi-supine in bed with PTA already in room to begin session. Pt vitals assessed and noted to BP noted to be 128/76 (92) with pt on RA and O2 sats WFL. Pt endorses abdominal pain, but agreeable to attempting mobility this date. He requires min/mod A to come to sitting at EOB with heavy cuing for use of logroll technique. Once seated upright at EOB pt is able to maintain fair static sitting balance with BUE support and LLE on floor. Pt requires MAX A +2 for x2 standing trials this date. He has difficulty with trunk elevation and requires assist to bring BOS toward R side with L lateral lean noted. Pt fatigues quickly with standing attempts, further mobility deferred for pt safety. Pt assisted back to bed, and positioned with pillows to R side to maximize skin integrity. OT facilitates set-up of meal tray items including sandwich and tea this date. Pt educated on importance of adequate nutritional intake during recovery, and provided with reinforcement of prior education on residual limb management techniques from past sessions. Pt return verbalizes understanding. Pt making good progress toward OT goals and continues to benefit from skilled OT services to maximize return to PLOF and minimize risk of future falls, injury, caregiver  burden, and readmission. Will continue to follow POC. Discharge recommendation remains appropriate.    Follow Up Recommendations  SNF    Equipment Recommendations  Other (comment) (defer to next venue of care.)    Recommendations for Other Services      Precautions / Restrictions Precautions Precautions: Fall Precaution Comments: Suprapubic catheter Restrictions LLE Weight Bearing: Non weight bearing Other Position/Activity Restrictions: s/p L BKA       Mobility Bed Mobility Overal bed mobility: Needs Assistance Bed Mobility: Supine to Sit;Sit to Supine Rolling: Mod assist   Supine to sit: Min assist Sit to supine: Mod assist   General bed mobility comments: extended time to perform, use of bed rails. Min assist to achieve EOB sitting.  Transfers Overall transfer level: Needs assistance Equipment used: Rolling walker (2 wheeled) Transfers: Lateral/Scoot Transfers Sit to Stand: From elevated surface;+2 physical assistance;+2 safety/equipment;Max assist        Lateral/Scoot Transfers: Mod assist General transfer comment: Pt trials standing x2 this date. He requires MAX +2 to come to stand and maintain upright position with heavy UE use on RW.    Balance Overall balance assessment: Needs assistance Sitting-balance support: Single extremity supported;Bilateral upper extremity supported;Feet unsupported (LLE supported) Sitting balance-Leahy Scale: Fair Sitting balance - Comments: Steady static sitting at EOB. Requires occasional min guard when performing dynamic tasks such as scooting.   Standing balance support: During functional activity Standing balance-Leahy Scale: Zero Standing balance comment: Requires max assist to maintain upright position with heavy BUE use on RW.  ADL either performed or assessed with clinical judgement   ADL Overall ADL's : Needs assistance/impaired Eating/Feeding: Sitting;Supervision/ safety;Bed  level Eating/Feeding Details (indicate cue type and reason): pt requires set-up of tray items this date.                                   General ADL Comments: Pt continues to require MOD/MAX A to perform most LB ADLs at bed level d/t generalized weakness, & RLE pain -- recent BKA. 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     Vision Baseline Vision/History: Wears glasses Patient Visual Report: No change from baseline     Perception     Praxis      Cognition Arousal/Alertness: Awake/alert Behavior During Therapy: WFL for tasks assessed/performed Overall Cognitive Status: Within Functional Limits for tasks assessed                                 General Comments: Pt generally appropriate/oriented t/o session. Follows VCs with min cueing/increased time.        Exercises Other Exercises Other Exercises: Ot facilitates review of prior education on residual limb management techniques and mobility attempts this date. Pt also educated on falls prevention strategies, bed mobility techniques, and safe transfer strategies.   Shoulder Instructions       General Comments Wound dressing intact at start/end of session.    Pertinent Vitals/ Pain       Pain Assessment: Faces Faces Pain Scale: Hurts even more Pain Location: Abdominal pain, and LLE pain with mobility. Pain Descriptors / Indicators: Grimacing;Sore;Aching;Guarding Pain Intervention(s): Limited activity within patient's tolerance;Monitored during session;Repositioned  Home Living                                          Prior Functioning/Environment              Frequency  Min 2X/week        Progress Toward Goals  OT Goals(current goals can now be found in the care plan section)  Progress towards OT goals: Progressing toward goals  Acute Rehab OT Goals Patient Stated Goal: To get stronger and get back home OT  Goal Formulation: With patient Time For Goal Achievement: 01/16/20 Potential to Achieve Goals: Good  Plan Discharge plan remains appropriate;Frequency remains appropriate    Co-evaluation    PT/OT/SLP Co-Evaluation/Treatment: Yes Reason for Co-Treatment: Complexity of the patient's impairments (multi-system involvement);For patient/therapist safety;To address functional/ADL transfers PT goals addressed during session: Mobility/safety with mobility;Balance;Proper use of DME;Strengthening/ROM OT goals addressed during session: ADL's and self-care;Proper use of Adaptive equipment and DME      AM-PAC OT "6 Clicks" Daily Activity     Outcome Measure   Help from another person eating meals?: A Little Help from another person taking care of personal grooming?: A Little Help from another person toileting, which includes using toliet, bedpan, or urinal?: A Lot Help from another person bathing (including washing, rinsing, drying)?: A Lot Help from another person to put on and taking off regular upper body clothing?: A Little Help from another person to put on and taking off regular lower body clothing?: A Lot 6 Click Score: 15    End of Session Equipment  Utilized During Treatment: Gait belt;Rolling walker  OT Visit Diagnosis: Other abnormalities of gait and mobility (R26.89);Muscle weakness (generalized) (M62.81)   Activity Tolerance Patient limited by pain;Patient tolerated treatment well   Patient Left in bed;with call bell/phone within reach;with bed alarm set (With PA in room.)   Nurse Communication          Time: 3762-8315 OT Time Calculation (min): 36 min  Charges: OT General Charges $OT Visit: 1 Visit OT Treatments $Self Care/Home Management : 8-22 mins  Shara Blazing, M.S., OTR/L Ascom: [phone] 01/09/20, 3:24 PM

## 2020-01-10 ENCOUNTER — Inpatient Hospital Stay: Payer: No Typology Code available for payment source

## 2020-01-10 ENCOUNTER — Inpatient Hospital Stay
Admit: 2020-01-10 | Discharge: 2020-01-10 | Disposition: A | Discharge status: Home / Self Care | Payer: No Typology Code available for payment source | Attending: Nephrology | Admitting: Nephrology

## 2020-01-10 DIAGNOSIS — L97523 Non-pressure chronic ulcer of other part of left foot with necrosis of muscle: Secondary | ICD-10-CM | POA: Diagnosis not present

## 2020-01-10 LAB — BASIC METABOLIC PANEL
Anion gap: 14 (ref 5–15)
Anion gap: 11 (ref 5–15)
BUN: 92 mg/dL — ABNORMAL HIGH (ref 8–23)
BUN: 98 mg/dL — ABNORMAL HIGH (ref 8–23)
CO2: 16 mmol/L — ABNORMAL LOW (ref 22–32)
CO2: 20 mmol/L — ABNORMAL LOW (ref 22–32)
Calcium: 7.6 mg/dL — ABNORMAL LOW (ref 8.9–10.3)
Calcium: 7.8 mg/dL — ABNORMAL LOW (ref 8.9–10.3)
Chloride: 98 mmol/L (ref 98–111)
Chloride: 94 mmol/L — ABNORMAL LOW (ref 98–111)
Creatinine, Ser: 3.07 mg/dL — ABNORMAL HIGH (ref 0.61–1.24)
Creatinine, Ser: 3.43 mg/dL — ABNORMAL HIGH (ref 0.61–1.24)
GFR calc Af Amer: 21 mL/min — ABNORMAL LOW (ref >60)
GFR calc Af Amer: 18 mL/min — ABNORMAL LOW (ref >60)
GFR calc non Af Amer: 18 mL/min — ABNORMAL LOW (ref >60)
GFR calc non Af Amer: 16 mL/min — ABNORMAL LOW (ref >60)
Glucose, Bld: 223 mg/dL — ABNORMAL HIGH (ref 70–99)
Glucose, Bld: 239 mg/dL — ABNORMAL HIGH (ref 70–99)
Potassium: 5.4 mmol/L — ABNORMAL HIGH (ref 3.5–5.1)
Potassium: 5.4 mmol/L — ABNORMAL HIGH (ref 3.5–5.1)
Sodium: 128 mmol/L — ABNORMAL LOW (ref 135–145)
Sodium: 125 mmol/L — ABNORMAL LOW (ref 135–145)

## 2020-01-10 LAB — GLUCOSE, CAPILLARY
Glucose-Capillary: 209 mg/dL — ABNORMAL HIGH (ref 70–99)
Glucose-Capillary: 177 mg/dL — ABNORMAL HIGH (ref 70–99)
Glucose-Capillary: 213 mg/dL — ABNORMAL HIGH (ref 70–99)
Glucose-Capillary: 210 mg/dL — ABNORMAL HIGH (ref 70–99)

## 2020-01-10 LAB — MAGNESIUM: Magnesium: 2.2 mg/dL (ref 1.7–2.4)

## 2020-01-10 MED ORDER — NEPRO/CARBSTEADY PO LIQD
237.0000 mL | Freq: Three times a day (TID) | ORAL | Status: DC
  Administered 2020-01-10: 237 mL via ORAL

## 2020-01-10 MED ORDER — POLYETHYLENE GLYCOL 3350 17 G PO PACK
17.0000 g | PACK | Freq: Every day | ORAL | Status: DC
  Administered 2020-01-10: 17 g via ORAL
  Filled 2020-01-10 (×2): qty 1

## 2020-01-10 MED ORDER — DOCUSATE SODIUM 100 MG PO CAPS
100.0000 mg | ORAL_CAPSULE | Freq: Two times a day (BID) | ORAL | Status: DC
  Administered 2020-01-10 (×2): 100 mg via ORAL
  Filled 2020-01-10 (×3): qty 1

## 2020-01-10 MED ORDER — FUROSEMIDE 10 MG/ML IJ SOLN
40.0000 mg | Freq: Two times a day (BID) | INTRAMUSCULAR | Status: DC
  Administered 2020-01-10 – 2020-01-12 (×5): 40 mg via INTRAVENOUS
  Filled 2020-01-10 (×5): qty 4

## 2020-01-10 NOTE — Progress Notes (Signed)
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 Patient was started on IV vancomycin, Rocephin and Flagyl, podiatry and vascular surgeon were consulted. Patient had a angiogram and revascularization on 7/12. But the foot gangrene does not seem to be improving, as a result, planning for below-knee amputation.  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 7/16.Antibiotic changed to Unasyn in addition to vancomycin. 7/17.Developed hyperkalemia, most likely due to surgery. Given Kayexalate, sodium bicarb, calcium gluconate and additional sodium bicarb drip. Increased leukocytosis, probably from trauma. 7/18.Condition improved. Leukocytosis better. Procalcitonin level trending down. Renal functionbetter. Potassium normalized. Completed antibiotics. 7/20.  Patient has altered mental status today.  Sleepy and confused.  Suspect UTI secondary to suprapubic catheter.  Obtain stat UA and urine culture.  Also started meropenem for now until culture results available.    Consultants:  Vascular, wound nursing Procedures:   Antimicrobials:      Subjective: Vomited this am a bit. No nausea. Not much bm.   Objective: Vitals:   01/08/20 2323 01/09/20 0741 01/09/20 1700 01/09/20 2331  BP: 112/62 101/68 123/76 108/83  Pulse: 92 89 90 89  Resp: (!) 22 16 16 17   Temp: 97.7 F (36.5 C) 98 F (36.7 C) 98.2 F (36.8 C) 97.7 F (36.5 C)  TempSrc:  Oral Oral Oral  SpO2: 93% 99% 100% 99%  Weight:      Height:          Intake/Output Summary (Last 24 hours) at 01/10/2020 5102 Last data filed at 01/10/2020 0500 Gross per 24 hour  Intake 120 ml  Output 200 ml  Net -80 ml   Filed Weights   01/03/2020 1533  Weight: 75.8 kg    Examination:  General exam: calm, nad Respiratory system: cta no w/r/r Cardiovascular system: Regular, S1-S2, positive JVD, no murmurs GU: scrotal edema normally better Gastrointestinal system: Soft nontender nondistended hyperactive positive bowel sounds  Central nervous system: Alert and oriented x3 .  Grossly intact Extremities:+ Right lower extremity edema. Lt BKA Skin: Warm dry Psychiatry:Mood & affect appropriate in current setting.     Data Reviewed: I have personally reviewed following labs and imaging studies  CBC: Recent Labs  Lab 01/04/20 0346 01/05/20 0444 01/06/20 0513 01/07/20 0309 01/09/20 0446  WBC 18.3* 22.4* 14.7* 15.0* 12.8*  NEUTROABS 15.5* 16.9* 10.9* 10.9* 9.9*  HGB 8.3* 8.7* 8.2* 8.1* 8.5*  HCT 25.2* 26.4* 23.9* 23.9* 25.7*  MCV 79.2* 78.3* 78.9* 79.1* 81.3  PLT 411* 542* 497* 550* 585*   Basic Metabolic Panel: Recent Labs  Lab 01/04/20 0346 01/04/20 0346 01/05/20 0444 01/05/20 0444 01/05/20 1257 01/06/20 0513 01/07/20 0309 01/09/20 0446 01/10/20 0526  NA 131*   < > 131*  --   --  132* 131* 127* 128*  K 5.1   < > 5.8*   < > 4.5 4.3 3.8 4.9 5.4*  CL 105   < > 102  --   --  102 102 95* 98  CO2 20*   < > 20*  --   --  22 23 19* 16*  GLUCOSE 210*   < > 153*  --   --  195* 60* 195* 223*  BUN 66*   < > 78*  --   --  74* 78* 93* 92*  CREATININE 2.05*   < > 2.27*  --   --  2.11* 1.93* 2.81* 3.07*  CALCIUM 7.7*   < > 8.1*  --   --  7.5* 7.6* 7.7* 7.6*  MG 2.0  --  2.1  --   --  2.0 2.0 2.1  --    < > = values in this interval not displayed.   GFR: Estimated Creatinine Clearance: 20.2 mL/min (A) (by C-G formula based on SCr of 3.07 mg/dL (H)). Liver Function Tests: No results for input(s): AST, ALT, ALKPHOS, BILITOT, PROT, ALBUMIN  in the last 168 hours. No results for input(s): LIPASE, AMYLASE in the last 168 hours. No results for input(s): AMMONIA in the last 168 hours. Coagulation Profile: No results for input(s): INR, PROTIME in the last 168 hours. Cardiac Enzymes: No results for input(s): CKTOTAL, CKMB, CKMBINDEX, TROPONINI in the last 168 hours. BNP (last 3 results) No results for input(s): PROBNP in the last 8760 hours. HbA1C: No results for input(s): HGBA1C in the last 72 hours. CBG: Recent Labs  Lab 01/08/20 1638 01/09/20 1137 01/09/20 1700 01/09/20 2039 01/10/20 0742  GLUCAP 276* 184* 191* 205* 209*   Lipid Profile: No results for input(s): CHOL, HDL, LDLCALC, TRIG, CHOLHDL, LDLDIRECT in the last 72 hours. Thyroid Function Tests: No results for input(s): TSH, T4TOTAL, FREET4, T3FREE, THYROIDAB in the last 72 hours. Anemia Panel: No results for input(s): VITAMINB12, FOLATE, FERRITIN, TIBC, IRON, RETICCTPCT in the last 72 hours. Sepsis Labs: Recent Labs  Lab 01/05/20 0444 01/06/20 0513 01/08/20 0854  PROCALCITON 0.48 0.37 0.33    Recent Results (from the past 240 hour(s))  Urine Culture     Status: Abnormal   Collection Time: 01/08/20 10:45 AM   Specimen: Urine, Random  Result Value Ref Range Status   Specimen Description   Final    URINE, RANDOM Performed at [facility], [address] [place], [place] [address]    Special Requests   Final    NONE Performed at [facility], [address] [place], [place] [address]    Culture >=100,000 COLONIES/mL YEAST (A)  Final   Report Status 01/09/2020 FINAL  Final         Radiology Studies: DG Abd 1 View  Result Date: 01/09/2020 CLINICAL DATA:  Vomiting and diarrhea for few days, abdominal pain EXAM: ABDOMEN - 1 VIEW COMPARISON:  12/29/2019 FINDINGS: Nonobstructive bowel gas pattern. Scattered stool in colon. No bowel dilatation or bowel wall thickening. Gaseous distention of stomach increased from previous exam.  Bones demineralized with degenerative changes of lumbar spine and BILATERAL hip joints. Extensive atherosclerotic calcifications. Pacemaker leads RIGHT atrium, RIGHT ventricle, and coronary sinus. IMPRESSION: Increased gaseous distention of stomach. Nonobstructive bowel gas pattern. Aortic Atherosclerosis (ICD10-I70.0). Electronically Signed   By: Lavonia Dana M.D.   On: 01/09/2020 10:25        Scheduled Meds:  aspirin EC  81 mg Oral Daily   Chlorhexidine Gluconate Cloth  6 each Topical Daily   ferrous sulfate  325 mg Oral Q breakfast   fluticasone  2 spray Each Nare Daily   gabapentin  100 mg Oral QHS   heparin  5,000 Units Subcutaneous Q8H   insulin aspart  0-15 Units Subcutaneous TID WC   lidocaine  1 patch Transdermal Q24H   loratadine  10 mg Oral Daily   multivitamin with  minerals  1 tablet Oral Daily   nutrition supplement (JUVEN)  1 packet Oral BID BM   pantoprazole  40 mg Oral Daily   Ensure Max Protein  11 oz Oral Daily   simethicone  80 mg Oral QID   simvastatin  10 mg Oral QHS   sodium chloride flush  3 mL Intravenous Once   tamsulosin  0.4 mg Oral Daily   vitamin B-12  1,000 mcg Oral Daily   Continuous Infusions:  sodium chloride 250 mL (01/05/20 0616)    Assessment & Plan:   Principal Problem:   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  Acute metabolic encephalopathy. 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  He also had some hypoglycemia , MS improved. Standing  lantus was discontinued due to hypoglycemia ucx +yeast, but no need to treat  IV fluid was discontinued due to volume  overload Start Lasix IV  2.  Uncontrolled type 2 diabetes with hypoglycemia. Patient had some hypoglycemia  Standing dose Lantus was discontinued until able to tolerate p.o. intake without vomiting Continue R ISS only    3.  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 Blood culture was negative.  Complete antibiotics. S/p BKA Daily dressing changes to stump vascluar following, healing well PT/OT -rec. SNF pending   4.  Vomiting- abd xr no obstruction. +gaseous.  Started on simethicone Needs to have bowel regimen Hold off on ct as cr up.  5.acute /Chronic kidney disease stage IIIb with hyperkalemia. Suprapubic cath working Possibly due to hypoperfusion from volume overload as on exam appears volume overloaded Start lasix Nephrology consulted  6.  Essential hypertension. Will d/c norvasc since bp on low side  7.  Reactive thrombocytosis. Continue to follow.  8. Hyponatremia- likely 2/2 vol overload Improved a little with lasix Will continue and monitor levels.  DVT prophylaxis: Heparin Code Status: Full Family Communication: None Disposition Plan: SNF pending Status is: Inpatient  Remains inpatient appropriate because:IV treatments appropriate due to intensity of illness or inability to take PO   Dispo: The patient is from: Home              Anticipated d/c is to: SNF              Anticipated d/c date is: 2 days              Patient currently is not medically stable to d/c.Pt requiring iv diuresis,not tolerating po intake            LOS: 15 days   Time spent: 45 min with >50% on coc    Nolberto Hanlon, MD [facility] Pager [phone]  If 7PM-7AM, please contact night-coverage [email or web address] Password [facility] 01/10/2020, 8:32 AM Patient ID: [name], male   DOB: 03/01/1941, 79 y.o.   MRN: [id number]

## 2020-01-10 NOTE — Progress Notes (Signed)
Nutrition Follow-up  DOCUMENTATION CODES:   Non-severe (moderate) malnutrition in context of chronic illness  INTERVENTION:  Will discontinue Ensure Max Protein and Juven.  Provide Nepro Shake po TID, each supplement provides 425 kcal and 19 grams protein. Patient prefers butter pecan.  Continue daily MVI.  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  New nutrition diagnosis.  GOAL:   Patient will meet greater than or equal to 90% of their needs  Progressing.   MONITOR:   PO intake, Supplement acceptance, Labs, Weight trends, I & O's  REASON FOR ASSESSMENT:   Malnutrition Screening Tool, Consult Wound healing  ASSESSMENT:   79 year old male admitted for ulcer of left foot with necrosis of muscle, followed by outpatient podiatry with past medical history of CAD s/p CABG, first degree AV block with bradycardia s/p PPM placement, CKD stage III, IDDM2, HTN, HLD, GERD, BPH, urinary retention with chronic suprapubic catheter. Patient presented from podiatry office with complaints of significantly increased pain in left foot for 4-5 days.   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  Met with patient at bedside. He reports he has not been able to eat well over the last several days due to nausea. According to chart patient had been eating 70-100%. He is now down to 0-40% of PO intake. He reports he can only tolerate small sips of Ensure Max and cannot tolerate Juven. RD will discontinue Juven and change Ensure Max to Nepro as patient would like to try the butter pecan flavor. Per RN patient has nausea and dry heaving after he starts eating. He only ate 20% of his breakfast this morning. He is constipated. Patient was started on bowel regimen and simethicone.   Patient reports that PTA he had a good appetite and ate "like a horse." He does endorse  that he has had weight loss and reports his UBW was 205 lbs. He is unsure exactly when weight loss occurred. According to chart patient was 97.3 kg on 02/13/2018. He was 75.8 kg on 7/72021. That is a weight loss of 21.5 kg (22.1% body weight) over unknown time frame.  Medications reviewed and include: Colace 100 mg BID, ferrous sulfate 325 mg daily, Lasix 40 mg Q12hrs IV, Novolog 0-15 units TID, MVI daily, Protonix, Miralax 17 grams daily, simethicone chewable 80 mg QID, vitamin B12 1000 micrograms daily.  Labs reviewed: CBG 177-209, Sodium 128, Potassium 5.4, CO2 16, BUN 92, Creatinine 3.07.  NUTRITION - FOCUSED PHYSICAL EXAM:    Most Recent Value  Orbital Region Moderate depletion  Upper Arm Region Moderate depletion  Thoracic and Lumbar Region Moderate depletion  Buccal Region Moderate depletion  Temple Region Severe depletion  Clavicle Bone Region Moderate depletion  Clavicle and Acromion Bone Region Moderate depletion  Scapular Bone Region Unable to assess  Dorsal Hand Severe depletion  Patellar Region Moderate depletion  Anterior Thigh Region Moderate depletion  Posterior Calf Region Moderate depletion  Edema (RD Assessment) None  Hair Reviewed  Eyes Reviewed  Mouth Reviewed  Skin Reviewed  Nails Reviewed     Diet Order:   Diet Order            Diet Carb Modified Fluid consistency: Thin; Room service appropriate? Yes  Diet effective now                EDUCATION NEEDS:   No education needs have  been identified at this time  Skin:  Skin Assessment: Skin Integrity Issues: Skin Integrity Issues:: Diabetic Ulcer, Other (Comment), Incisions, Stage I Stage I: Medial buttocks (7/10) Diabetic Ulcer: left foot Incisions: Closed; L leg (7/15) Other: ecchymosis;bilateral arm  Last BM:  7/14 type 6  Height:   Ht Readings from Last 1 Encounters:  12/27/19 _0  (1.88 m)   Weight:   Wt Readings from Last 1 Encounters:  01/11/2020 75.8 kg   Ideal Body Weight:  86.4  kg  BMI:  Body mass index is 21.44 kg/m.  Estimated Nutritional Needs:   Kcal:  6503-5465  Protein:  110-120  Fluid:  1.9 L  Jacklynn Barnacle, MS, RD, LDN Pager number available on Amion

## 2020-01-10 NOTE — Progress Notes (Signed)
*  PRELIMINARY RESULTS* Echocardiogram 2D Echocardiogram has been performed.  [name] 01/10/2020, 7:53 PM

## 2020-01-10 NOTE — Progress Notes (Signed)
PT Cancellation Note  Patient Details Name: [name] MRN: [id number] DOB: 04/26/1941   Cancelled Treatment:     Per PT protocol, will hold. Pt has potassium of 5.4. will continue to follow  when pt is more appropriate to participate.    Willette Pa 01/10/2020, 8:18 AM

## 2020-01-10 NOTE — Consult Note (Signed)
[facility]  CONSULT NOTE    Date: 01/10/2020                  Patient Name:  [name]  MRN: [id number]  DOB: 08-23-1940  Age / Sex: 79 y.o., male         PCP: Olin Hauser, DO                 Service Requesting Consult: Dr. [name]                 Reason for Consult: Acute renal failure            History of Present Illness: [name] was admitted to diabetic foot ulcer. Patient underwent left below the knee amputation by Dr. Lucky Cowboy on 7/15. Patient was then found to have confusion and suspected urinary tract infection. Found to have funguria which was decided to not treat.    Medications: Outpatient medications: Medications Prior to Admission  Medication Sig Dispense Refill Last Dose  . amLODipine (NORVASC) 10 MG tablet Take 1 tablet (10 mg total) by mouth daily. 90 tablet 3 01/08/2020 at 0800  . aspirin EC 81 MG tablet Take 81 mg by mouth daily.   12/22/2019 at 0800  . atorvastatin (LIPITOR) 80 MG tablet Take 40 mg by mouth at bedtime.   12/25/2019 at 2000  . calcium acetate (PHOSLO) 667 MG capsule Take 667 mg by mouth 2 (two) times daily with a meal.   01/19/2020 at 0800  . glipiZIDE (GLUCOTROL) 5 MG tablet Take 2.5 mg by mouth 2 (two) times daily before a meal.   12/24/2019 at 0800  . Insulin Glargine (LANTUS) 100 UNIT/ML Solostar Pen Inject 18 Units into the skin daily.    01/07/2020 at 0800  . omeprazole (PRILOSEC) 20 MG capsule Take 1 capsule (20 mg total) by mouth daily. 30 capsule 12 12/23/2019 at 0800  . tamsulosin (FLOMAX) 0.4 MG CAPS capsule Take 1 capsule (0.4 mg total) by mouth daily. 30 capsule 3 12/21/2019 at 0800  . acetaminophen (TYLENOL) 500 MG tablet Take 1,000 mg by mouth every 6 (six) hours as needed for mild pain, fever or headache.   prn at prn  . fluticasone (FLONASE) 50 MCG/ACT nasal spray Place 2 sprays into both nostrils daily. Use for 4-6 weeks then stop and use seasonally or as needed. 16 g 3 prn at prn  . loratadine  (CLARITIN) 10 MG tablet Take 1 tablet (10 mg total) by mouth daily. Use for 4-6 weeks then stop, and use as needed or seasonally 30 tablet 11 prn at prn    Current medications: Current Facility-Administered Medications  Medication Dose Route Frequency Provider Last Rate Last Admin  . 0.9 %  sodium chloride infusion   Intravenous PRN Sharen Hones, MD 10 mL/hr at 01/05/20 0616 250 mL at 01/05/20 0616  . acetaminophen (TYLENOL) tablet 650 mg  650 mg Oral Q6H PRN Algernon Huxley, MD   650 mg at 01/08/20 1557   Or  . acetaminophen (TYLENOL) suppository 650 mg  650 mg Rectal Q6H PRN Algernon Huxley, MD      . alum & mag hydroxide-simeth (MAALOX/MYLANTA) 200-200-20 MG/5ML suspension 15 mL  15 mL Oral Q6H PRN Algernon Huxley, MD   15 mL at 01/04/20 2303  . aspirin EC tablet 81 mg  81 mg Oral Daily Algernon Huxley, MD   81 mg at 01/10/20 9518  . calcium carbonate (TUMS -  dosed in mg elemental calcium) chewable tablet 200 mg of elemental calcium  1 tablet Oral TID PRN Algernon Huxley, MD   200 mg of elemental calcium at 01/09/20 0510  . Chlorhexidine Gluconate Cloth 2 % PADS 6 each  6 each Topical Daily Algernon Huxley, MD   6 each at 01/09/20 1113  . docusate sodium (COLACE) capsule 100 mg  100 mg Oral BID Nolberto Hanlon, MD   100 mg at 01/10/20 1501  . feeding supplement (NEPRO CARB STEADY) liquid 237 mL  237 mL Oral TID BM Nolberto Hanlon, MD      . ferrous sulfate tablet 325 mg  325 mg Oral Q breakfast Sharen Hones, MD   325 mg at 01/10/20 0837  . fluticasone (FLONASE) 50 MCG/ACT nasal spray 2 spray  2 spray Each Nare Daily Algernon Huxley, MD   2 spray at 01/10/20 0837  . furosemide (LASIX) injection 40 mg  40 mg Intravenous Q12H Irving Lubbers, MD   40 mg at 01/10/20 1035  . gabapentin (NEURONTIN) capsule 100 mg  100 mg Oral QHS Algernon Huxley, MD   100 mg at 01/09/20 2238  . guaiFENesin-dextromethorphan (ROBITUSSIN DM) 100-10 MG/5ML syrup 10 mL  10 mL Oral Q6H PRN Algernon Huxley, MD      . heparin injection 5,000 Units  5,000  Units Subcutaneous Q8H Sharen Hones, MD   5,000 Units at 01/10/20 1454  . insulin aspart (novoLOG) injection 0-15 Units  0-15 Units Subcutaneous TID WC Algernon Huxley, MD   3 Units at 01/10/20 1251  . lidocaine (LIDODERM) 5 % 1 patch  1 patch Transdermal Q24H Algernon Huxley, MD   1 patch at 01/10/20 1454  . loratadine (CLARITIN) tablet 10 mg  10 mg Oral Daily Algernon Huxley, MD   10 mg at 01/10/20 0837  . multivitamin with minerals tablet 1 tablet  1 tablet Oral Daily Algernon Huxley, MD   1 tablet at 01/10/20 [phone]  . ondansetron (ZOFRAN) tablet 4 mg  4 mg Oral Q6H PRN Algernon Huxley, MD   4 mg at 01/09/20 1508   Or  . ondansetron (ZOFRAN) injection 4 mg  4 mg Intravenous Q6H PRN Algernon Huxley, MD   4 mg at 01/09/20 0510  . oxyCODONE (Oxy IR/ROXICODONE) immediate release tablet 5 mg  5 mg Oral Q6H PRN Lang Snow, NP   5 mg at 01/10/20 1146  . pantoprazole (PROTONIX) EC tablet 40 mg  40 mg Oral Daily Algernon Huxley, MD   40 mg at 01/10/20 0837  . polyethylene glycol (MIRALAX / GLYCOLAX) packet 17 g  17 g Oral Daily Nolberto Hanlon, MD   17 g at 01/10/20 1501  . polyvinyl alcohol (LIQUIFILM TEARS) 1.4 % ophthalmic solution 2 drop  2 drop Both Eyes PRN Algernon Huxley, MD      . promethazine (PHENERGAN) injection 12.5 mg  12.5 mg Intravenous Q6H PRN Algernon Huxley, MD   12.5 mg at 01/10/20 0911  . simethicone (MYLICON) chewable tablet 80 mg  80 mg Oral QID Nolberto Hanlon, MD   80 mg at 01/10/20 1454  . simvastatin (ZOCOR) tablet 10 mg  10 mg Oral QHS Algernon Huxley, MD   10 mg at 01/09/20 2238  . sodium chloride flush (NS) 0.9 % injection 3 mL  3 mL Intravenous Once Algernon Huxley, MD      . sodium phosphate (FLEET) 7-19 GM/118ML enema 1 enema  1  enema Rectal Daily PRN Algernon Huxley, MD   1 enema at 01/09/20 1124  . tamsulosin (FLOMAX) capsule 0.4 mg  0.4 mg Oral Daily Algernon Huxley, MD   0.4 mg at 01/10/20 0837  . vitamin B-12 (CYANOCOBALAMIN) tablet 1,000 mcg  1,000 mcg Oral Daily Sharen Hones, MD   1,000 mcg at  01/10/20 0737      Allergies: Allergies  Allergen Reactions  . Ivp Dye [Iodinated Diagnostic Agents] Other (See Comments)    Reaction:  Unknown   . Other     Pain meds by mouth FA dye Unknown reaction  . Codeine Nausea And Vomiting and Rash      Past Medical History: Past Medical History:  Diagnosis Date  . Carcinoma of skin   . Cauda equina compression ([place])   . Diabetic retinopathy ([place])   . GERD (gastroesophageal reflux disease)   . Hypertension      Past Surgical History: Past Surgical History:  Procedure Laterality Date  . AMPUTATION Left 01/12/2020   Procedure: LEFT BELOW KNEE AMPUTATION;  Surgeon: Algernon Huxley, MD;  Location: [facility] ORS;  Service: General;  Laterality: Left;  . BACK SURGERY  07/2012  . CARDIAC CATHETERIZATION  2005  . CATARACT EXTRACTION  2014   right eye   . CORONARY ARTERY BYPASS GRAFT  2005    CABG x 7 at [place]     left knee  . LOWER EXTREMITY ANGIOGRAPHY Left 12/28/2019   Procedure: Lower Extremity Angiography;  Surgeon: Algernon Huxley, MD;  Location: [place] [facility];  Service: Cardiovascular;  Laterality: Left;     Family History: Family History  Problem Relation Age of Onset  . Hypertension Sister   . Hyperlipidemia Sister      Social History: Social History   Socioeconomic History  . Marital status: Divorced    Spouse name: Not on file  . Number of children: Not on file  . Years of education: Not on file  . Highest education level: Not on file  Occupational History  . Not on file  Tobacco Use  . Smoking status: Former Smoker    Years: 45.00    Types: Pipe    Quit date: 2016    Years since quitting: 5.5  . Smokeless tobacco: Former Network engineer and Sexual Activity  . Alcohol use: Yes    Alcohol/week: 0.0 standard drinks    Comment: occasional.  . Drug use: No  . Sexual activity: Not on file  Other Topics Concern  . Not on file  Social History Narrative  . Not on file   Social  Determinants of Health   Financial Resource Strain:   . Difficulty of Paying Living Expenses:   Food Insecurity:   . Worried About Charity fundraiser in the Last Year:   . Arboriculturist in the Last Year:   Transportation Needs:   . Film/video editor (Medical):   Marland Kitchen Lack of Transportation (Non-Medical):   Physical Activity:   . Days of Exercise per Week:   . Minutes of Exercise per Session:   Stress:   . Feeling of Stress :   Social Connections:   . Frequency of Communication with Friends and Family:   . Frequency of Social Gatherings with Friends and Family:   . Attends Religious Services:   . Active Member of Clubs or Organizations:   . Attends Archivist Meetings:   Marland Kitchen Marital Status:  Intimate Partner Violence:   . Fear of Current or Ex-Partner:   . Emotionally Abused:   Marland Kitchen Physically Abused:   . Sexually Abused:      Review of Systems: Review of Systems  Constitutional: Negative.   HENT: Negative.   Eyes: Negative.   Respiratory: Negative for cough, hemoptysis, sputum production, shortness of breath and wheezing.   Cardiovascular: Negative for chest pain, palpitations, orthopnea, claudication, leg swelling and PND.  Gastrointestinal: Negative.   Genitourinary: Negative for dysuria, flank pain, frequency, hematuria and urgency.  Musculoskeletal: Negative for back pain, falls, joint pain, myalgias and neck pain.  Skin: Negative.   Neurological: Negative.   Endo/Heme/Allergies: Negative.   Psychiatric/Behavioral: Negative.     Vital Signs: Blood pressure 108/79, pulse 88, temperature 97.6 F (36.4 C), temperature source Oral, resp. rate 14, height 6\' 2"  (1.88 m), weight 75.8 kg, SpO2 100 %.  Weight trends: Filed Weights   01/07/2020 1533  Weight: 75.8 kg    Physical Exam: General: NAD,   Head: Normocephalic, atraumatic. Moist oral mucosal membranes  Eyes: Anicteric, PERRL  Neck: Supple, trachea midline  Lungs:  Clear to auscultation  Heart:  Regular rate and rhythm  Abdomen:  Soft, nontender,   Extremities:  left BKA, clean dressings  Neurologic: Nonfocal, moving all four extremities  Skin: No lesions         Lab results: Basic Metabolic Panel: Recent Labs  Lab 01/04/20 0346 01/04/20 0346 01/05/20 0444 01/05/20 1257 01/06/20 0513 01/06/20 0513 01/07/20 0309 01/09/20 0446 01/10/20 0526  NA 131*   < > 131*  --  132*   < > 131* 127* 128*  K 5.1   < > 5.8*   < > 4.3   < > 3.8 4.9 5.4*  CL 105   < > 102  --  102   < > 102 95* 98  CO2 20*   < > 20*  --  22   < > 23 19* 16*  GLUCOSE 210*   < > 153*  --  195*   < > 60* 195* 223*  BUN 66*   < > 78*  --  74*   < > 78* 93* 92*  CREATININE 2.05*   < > 2.27*  --  2.11*   < > 1.93* 2.81* 3.07*  CALCIUM 7.7*   < > 8.1*  --  7.5*   < > 7.6* 7.7* 7.6*  MG 2.0  --  2.1  --  2.0  --  2.0 2.1  --    < > = values in this interval not displayed.    Liver Function Tests: No results for input(s): AST, ALT, ALKPHOS, BILITOT, PROT, ALBUMIN in the last 168 hours. No results for input(s): LIPASE, AMYLASE in the last 168 hours. No results for input(s): AMMONIA in the last 168 hours.  CBC: Recent Labs  Lab 01/04/20 0346 01/04/20 0346 01/05/20 0444 01/05/20 0444 01/06/20 0513 01/07/20 0309 01/09/20 0446  WBC 18.3*   < > 22.4*   < > 14.7* 15.0* 12.8*  NEUTROABS 15.5*   < > 16.9*   < > 10.9* 10.9* 9.9*  HGB 8.3*   < > 8.7*   < > 8.2* 8.1* 8.5*  HCT 25.2*   < > 26.4*   < > 23.9* 23.9* 25.7*  MCV 79.2*  --  78.3*  --  78.9* 79.1* 81.3  PLT 411*   < > 542*   < > 497* 550* 570*   < > =  values in this interval not displayed.    Cardiac Enzymes: No results for input(s): CKTOTAL, CKMB, CKMBINDEX, TROPONINI in the last 168 hours.  BNP: Invalid input(s): POCBNP  CBG: Recent Labs  Lab 01/09/20 1137 01/09/20 1700 01/09/20 2039 01/10/20 0742 01/10/20 1136  GLUCAP 184* 191* 205* 209* 177*    Microbiology: Results for orders placed or performed during the hospital encounter  of 12/25/2019  Blood Cultures x 2 sites     Status: None   Collection Time: 12/25/2019  8:48 PM   Specimen: BLOOD  Result Value Ref Range Status   Specimen Description BLOOD BLOOD LEFT FOREARM  Final   Special Requests   Final    BOTTLES DRAWN AEROBIC AND ANAEROBIC Blood Culture adequate volume   Culture   Final    NO GROWTH 5 DAYS Performed at [facility], [address] [place], [place] [address]    Report Status 01/07/2020 FINAL  Final  Culture, blood (Routine X 2) w Reflex to ID Panel     Status: None   Collection Time: 12/27/19  7:01 PM   Specimen: BLOOD  Result Value Ref Range Status   Specimen Description BLOOD BLOOD RIGHT HAND  Final   Special Requests   Final    BOTTLES DRAWN AEROBIC AND ANAEROBIC Blood Culture adequate volume   Culture   Final    NO GROWTH 5 DAYS Performed at [facility], [address], [place], [place] [address]    Report Status 01/03/2020 FINAL  Final  SARS Coronavirus 2 by RT PCR (hospital order, performed in [facility] hospital lab) Nasopharyngeal Nasopharyngeal Swab     Status: None   Collection Time: 12/27/19  8:45 PM   Specimen: Nasopharyngeal Swab  Result Value Ref Range Status   SARS Coronavirus 2 NEGATIVE NEGATIVE Final    Comment: (NOTE) SARS-CoV-2 target nucleic acids are NOT DETECTED.  The SARS-CoV-2 RNA is generally detectable in upper and lower respiratory specimens during the acute phase of infection. The lowest concentration of SARS-CoV-2 viral copies this assay can detect is 250 copies / mL. A negative result does not preclude SARS-CoV-2 infection and should not be used as the sole basis for treatment or other patient management decisions.  A negative result may occur with improper specimen collection / handling, submission of specimen other than nasopharyngeal swab, presence of viral mutation(s) within the areas targeted by this assay, and inadequate number of viral copies (<250 copies / mL). A negative  result must be combined with clinical observations, patient history, and epidemiological information.  Fact Sheet for Patients:   StrictlyIdeas.no  Fact Sheet for Healthcare Providers: BankingDealers.co.za  This test is not yet approved or  cleared by the [place] FDA and has been authorized for detection and/or diagnosis of SARS-CoV-2 by FDA under an Emergency Use Authorization (EUA).  This EUA will remain in effect (meaning this test can be used) for the duration of the COVID-19 declaration under Section 564(b)(1) of the Act, 21 U.S.C. section 360bbb-3(b)(1), unless the authorization is terminated or revoked sooner.  Performed at [facility], [address] [place], [place] [address]   MRSA PCR Screening     Status: None   Collection Time: 12/27/19  8:45 PM   Specimen: Nasopharyngeal  Result Value Ref Range Status   MRSA by PCR NEGATIVE NEGATIVE Final    Comment:        The GeneXpert MRSA Assay (FDA approved for NASAL specimens only), is one component of a comprehensive  MRSA colonization surveillance program. It is not intended to diagnose MRSA infection nor to guide or monitor treatment for MRSA infections. Performed at [facility], [address] [place], [place] [address]   Urine Culture     Status: Abnormal   Collection Time: 01/08/20 10:45 AM   Specimen: Urine, Random  Result Value Ref Range Status   Specimen Description   Final    URINE, RANDOM Performed at [facility], [address], [place], [place] [address]    Special Requests   Final    NONE Performed at [facility], [address] [place], [place] [address]    Culture >=100,000 COLONIES/mL YEAST (A)  Final   Report Status 01/09/2020 FINAL  Final    Coagulation Studies: No results for input(s): LABPROT, INR in the last 72 hours.  Urinalysis: Recent Labs    01/08/20 1043  COLORURINE AMBER*   LABSPEC 1.023  PHURINE 5.0  GLUCOSEU NEGATIVE  HGBUR SMALL*  BILIRUBINUR NEGATIVE  KETONESUR NEGATIVE  PROTEINUR 100*  NITRITE NEGATIVE  LEUKOCYTESUR MODERATE*      Imaging: DG Abd 1 View  Result Date: 01/09/2020 CLINICAL DATA:  Vomiting and diarrhea for few days, abdominal pain EXAM: ABDOMEN - 1 VIEW COMPARISON:  12/29/2019 FINDINGS: Nonobstructive bowel gas pattern. Scattered stool in colon. No bowel dilatation or bowel wall thickening. Gaseous distention of stomach increased from previous exam. Bones demineralized with degenerative changes of lumbar spine and BILATERAL hip joints. Extensive atherosclerotic calcifications. Pacemaker leads RIGHT atrium, RIGHT ventricle, and coronary sinus. IMPRESSION: Increased gaseous distention of stomach. Nonobstructive bowel gas pattern. Aortic Atherosclerosis (ICD10-I70.0). Electronically Signed   By: Lavonia Dana M.D.   On: 01/09/2020 10:25   US RENAL  Result Date: 01/10/2020 CLINICAL DATA:  Acute kidney failure. EXAM: RENAL / URINARY TRACT ULTRASOUND COMPLETE COMPARISON:  Renal ultrasound 07/30/2015 FINDINGS: Right Kidney: Renal measurements: 12.1 x 5.9 x 6.3 cm = volume: 238 mL . Echogenicity within normal limits. There is a cyst in the superior pole measuring 1.1 x 1.0 x 1.0 cm. There is another cyst in the inferior pole measuring 2.3 x 2.1 x 2.4 cm. Left Kidney: Renal measurements: 12.1 x 5.6 x 6.5 cm = volume: 230 mL. Echogenicity within normal limits. No mass or hydronephrosis visualized. Bladder: Not visualized.  Patient has suprapubic catheter. Other: None. IMPRESSION: No acute finding sonographically in the bilateral kidneys. Electronically Signed   By: Audie Pinto M.D.   On: 01/10/2020 12:46      Assessment & Plan: [name] is a 79 y.o. 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 Lucky Cowboy  1. Acute renal failure with hyperkalemia and metabolic acidosis on chronic kidney diease stage IV with baseline creatinine of 2.24, GFR of 26 on 02/11/2018. Chronic kidney disease secondary to diabetic nephropathy, hypertension and obstructive uropathy.  No IV contrast. Renal ultrasound with no obstruction.  Acute renal failure seems to be related to concurrent illness - Check SPEP and UPEP  2. Hypertension: with peripheral edema - check echocardiogram - start furosemide.  -  Recheck serum albumin   3. Hyponatremia: secondary to volume overload and renal failure.  - start loop diuretics as above.   LOS: [place] 7/22/20213:32 PM

## 2020-01-10 NOTE — Discharge Instructions (Signed)
Vascular Surgery Discharge Instructions:  1) Daily dressing changes to stump. - ABD to staple line - Cover with Kerlix - Cover with Ace - Encourage flexibility at the knee joint

## 2020-01-10 NOTE — Progress Notes (Signed)
Received report from [place], Therapist, sports. Assuming care of patient. Patient alert and oriented and absent signs of distress. Call bell in reach. Undergoing Echocardiogram now at bedside.

## 2020-01-11 DIAGNOSIS — L97523 Non-pressure chronic ulcer of other part of left foot with necrosis of muscle: Secondary | ICD-10-CM | POA: Diagnosis not present

## 2020-01-11 DIAGNOSIS — I5043 Acute on chronic combined systolic (congestive) and diastolic (congestive) heart failure: Secondary | ICD-10-CM

## 2020-01-11 DIAGNOSIS — E440 Moderate protein-calorie malnutrition: Secondary | ICD-10-CM | POA: Insufficient documentation

## 2020-01-11 LAB — COMPREHENSIVE METABOLIC PANEL
ALT: 99 U/L — ABNORMAL HIGH (ref 0–44)
AST: 158 U/L — ABNORMAL HIGH (ref 15–41)
Albumin: 2.5 g/dL — ABNORMAL LOW (ref 3.5–5.0)
Alkaline Phosphatase: 239 U/L — ABNORMAL HIGH (ref 38–126)
Anion gap: 15 (ref 5–15)
BUN: 98 mg/dL — ABNORMAL HIGH (ref 8–23)
CO2: 20 mmol/L — ABNORMAL LOW (ref 22–32)
Calcium: 8 mg/dL — ABNORMAL LOW (ref 8.9–10.3)
Chloride: 92 mmol/L — ABNORMAL LOW (ref 98–111)
Creatinine, Ser: 3.55 mg/dL — ABNORMAL HIGH (ref 0.61–1.24)
GFR calc Af Amer: 18 mL/min — ABNORMAL LOW (ref >60)
GFR calc non Af Amer: 15 mL/min — ABNORMAL LOW (ref >60)
Glucose, Bld: 296 mg/dL — ABNORMAL HIGH (ref 70–99)
Potassium: 5.9 mmol/L — ABNORMAL HIGH (ref 3.5–5.1)
Sodium: 127 mmol/L — ABNORMAL LOW (ref 135–145)
Total Bilirubin: 1.3 mg/dL — ABNORMAL HIGH (ref 0.3–1.2)
Total Protein: 6.2 g/dL — ABNORMAL LOW (ref 6.5–8.1)

## 2020-01-11 LAB — ECHOCARDIOGRAM COMPLETE
AR max vel: 2.01 cm2
AV Area VTI: 1.94 cm2
AV Area mean vel: 1.67 cm2
AV Mean grad: 2 mmHg
AV Peak grad: 3.1 mmHg
Ao pk vel: 0.88 m/s
Area-P 1/2: 5.31 cm2
Height: 74 in
S' Lateral: 4.8 cm
Weight: 2672 oz

## 2020-01-11 LAB — CBC
HCT: 26.7 % — ABNORMAL LOW (ref 39.0–52.0)
Hemoglobin: 8.3 g/dL — ABNORMAL LOW (ref 13.0–17.0)
MCH: 26.4 pg (ref 26.0–34.0)
MCHC: 31.1 g/dL (ref 30.0–36.0)
MCV: 85 fL (ref 80.0–100.0)
Platelets: 550 10*3/uL — ABNORMAL HIGH (ref 150–400)
RBC: 3.14 MIL/uL — ABNORMAL LOW (ref 4.22–5.81)
RDW: 20.5 % — ABNORMAL HIGH (ref 11.5–15.5)
WBC: 14.4 10*3/uL — ABNORMAL HIGH (ref 4.0–10.5)
nRBC: 0.3 % — ABNORMAL HIGH (ref 0.0–0.2)

## 2020-01-11 LAB — GLUCOSE, CAPILLARY
Glucose-Capillary: 209 mg/dL — ABNORMAL HIGH (ref 70–99)
Glucose-Capillary: 253 mg/dL — ABNORMAL HIGH (ref 70–99)
Glucose-Capillary: 212 mg/dL — ABNORMAL HIGH (ref 70–99)
Glucose-Capillary: 283 mg/dL — ABNORMAL HIGH (ref 70–99)
Glucose-Capillary: 226 mg/dL — ABNORMAL HIGH (ref 70–99)
Glucose-Capillary: 202 mg/dL — ABNORMAL HIGH (ref 70–99)

## 2020-01-11 MED ORDER — MILRINONE LACTATE 10 MG/10ML IV SOLN: 0.3000 ug/kg/min | INTRAVENOUS | Status: DC

## 2020-01-11 MED ORDER — PATIROMER SORBITEX CALCIUM 8.4 G PO PACK
16.8000 g | PACK | Freq: Every day | ORAL | Status: DC
  Filled 2020-01-11 (×4): qty 2

## 2020-01-11 MED ORDER — ALBUMIN HUMAN 25 % IV SOLN
25.0000 g | Freq: Two times a day (BID) | INTRAVENOUS | Status: AC
  Administered 2020-01-11 – 2020-01-13 (×6): 25 g via INTRAVENOUS
  Filled 2020-01-11 (×7): qty 100

## 2020-01-11 MED ORDER — INSULIN GLARGINE 100 UNIT/ML ~~LOC~~ SOLN
10.0000 [IU] | Freq: Every day | SUBCUTANEOUS | Status: DC
  Administered 2020-01-11 – 2020-01-16 (×5): 10 [IU] via SUBCUTANEOUS
  Filled 2020-01-11 (×7): qty 0.1

## 2020-01-11 MED ORDER — MILRINONE LACTATE IN DEXTROSE 20-5 MG/100ML-% IV SOLN
0.3000 ug/kg/min | INTRAVENOUS | Status: DC
  Administered 2020-01-11 – 2020-01-16 (×11): 0.3 ug/kg/min via INTRAVENOUS
  Filled 2020-01-11 (×12): qty 100

## 2020-01-11 NOTE — Progress Notes (Signed)
[facility]  ROUNDING NOTE   Subjective:   Echo with 32-54% systolic ejection fraction.   Patient states he is breathing better and feels he is doing better.   UOP 530 recorded however patient states he emptied his leg bag and that urine was not recorded.   Objective:  Vital signs in last 24 hours:  Temp:  [97.6 F (36.4 C)-98 F (36.7 C)] 98 F (36.7 C) (07/23 0800) Pulse Rate:  [88-89] 88 (07/23 0800) Resp:  [14-19] 14 (07/23 0800) BP: (106-119)/(64-79) 119/64 (07/23 0800) SpO2:  [95 %-100 %] 97 % (07/23 0800)  Weight change:  Filed Weights   12/23/2019 1533  Weight: 75.8 kg    Intake/Output: I/O last 3 completed shifts: In: 240 [P.O.:240] Out: 730 [Urine:730]   Intake/Output this shift:  Total I/O In: 120 [P.O.:120] Out: -   Physical Exam: General: NAD,   Head: Normocephalic, atraumatic. Moist oral mucosal membranes  Eyes: Anicteric, PERRL  Neck: Supple, trachea midline  Lungs:  Clear to auscultation  Heart: Regular rate and rhythm  Abdomen:  Soft, nontender,   Extremities:  + peripheral edema.  Neurologic: Nonfocal, moving all four extremities  Skin: No lesions  GU: Suprapubic foley catheter    Basic Metabolic Panel: Recent Labs  Lab 01/05/20 0444 01/05/20 1257 01/06/20 0513 01/06/20 0513 01/07/20 0309 01/07/20 0309 01/09/20 0446 01/09/20 0446 01/10/20 0526 01/10/20 2146 01/11/20 0443  NA 131*  --  132*   < > 131*  --  127*  --  128* 125* 127*  K 5.8*   < > 4.3   < > 3.8  --  4.9  --  5.4* 5.4* 5.9*  CL 102  --  102   < > 102  --  95*  --  98 94* 92*  CO2 20*  --  22   < > 23  --  19*  --  16* 20* 20*  GLUCOSE 153*  --  195*   < > 60*  --  195*  --  223* 239* 296*  BUN 78*  --  74*   < > 78*  --  93*  --  92* 98* 98*  CREATININE 2.27*  --  2.11*   < > 1.93*  --  2.81*  --  3.07* 3.43* 3.55*  CALCIUM 8.1*  --  7.5*   < > 7.6*   < > 7.7*   < > 7.6* 7.8* 8.0*  MG 2.1  --  2.0  --  2.0  --  2.1  --   --  2.2  --    < > = values in  this interval not displayed.    Liver Function Tests: Recent Labs  Lab 01/11/20 0443  AST 158*  ALT 99*  ALKPHOS 239*  BILITOT 1.3*  PROT 6.2*  ALBUMIN 2.5*   No results for input(s): LIPASE, AMYLASE in the last 168 hours. No results for input(s): AMMONIA in the last 168 hours.  CBC: Recent Labs  Lab 01/05/20 0444 01/06/20 0513 01/07/20 0309 01/09/20 0446 01/11/20 0443  WBC 22.4* 14.7* 15.0* 12.8* 14.4*  NEUTROABS 16.9* 10.9* 10.9* 9.9*  --   HGB 8.7* 8.2* 8.1* 8.5* 8.3*  HCT 26.4* 23.9* 23.9* 25.7* 26.7*  MCV 78.3* 78.9* 79.1* 81.3 85.0  PLT 542* 497* 550* 570* 550*    Cardiac Enzymes: No results for input(s): CKTOTAL, CKMB, CKMBINDEX, TROPONINI in the last 168 hours.  BNP: Invalid input(s): POCBNP  CBG: Recent Labs  Lab 01/10/20  6294 01/10/20 1136 01/10/20 1633 01/10/20 2101 01/11/20 [place]* 283*    Microbiology: Results for orders placed or performed during the hospital encounter of 01/07/2020  Blood Cultures x 2 sites     Status: None   Collection Time: 01/05/2020  8:48 PM   Specimen: BLOOD  Result Value Ref Range Status   Specimen Description BLOOD BLOOD LEFT FOREARM  Final   Special Requests   Final    BOTTLES DRAWN AEROBIC AND ANAEROBIC Blood Culture adequate volume   Culture   Final    NO GROWTH 5 DAYS Performed at [facility], [address] [place], [place] [address]    Report Status 01/08/2020 FINAL  Final  Culture, blood (Routine X 2) w Reflex to ID Panel     Status: None   Collection Time: 12/27/19  7:01 PM   Specimen: BLOOD  Result Value Ref Range Status   Specimen Description BLOOD BLOOD RIGHT HAND  Final   Special Requests   Final    BOTTLES DRAWN AEROBIC AND ANAEROBIC Blood Culture adequate volume   Culture   Final    NO GROWTH 5 DAYS Performed at [facility], [address], [place], [place] [address]    Report Status 01/06/2020 FINAL  Final  SARS Coronavirus 2 by RT PCR (hospital  order, performed in [facility] hospital lab) Nasopharyngeal Nasopharyngeal Swab     Status: None   Collection Time: 12/27/19  8:45 PM   Specimen: Nasopharyngeal Swab  Result Value Ref Range Status   SARS Coronavirus 2 NEGATIVE NEGATIVE Final    Comment: (NOTE) SARS-CoV-2 target nucleic acids are NOT DETECTED.  The SARS-CoV-2 RNA is generally detectable in upper and lower respiratory specimens during the acute phase of infection. The lowest concentration of SARS-CoV-2 viral copies this assay can detect is 250 copies / mL. A negative result does not preclude SARS-CoV-2 infection and should not be used as the sole basis for treatment or other patient management decisions.  A negative result may occur with improper specimen collection / handling, submission of specimen other than nasopharyngeal swab, presence of viral mutation(s) within the areas targeted by this assay, and inadequate number of viral copies (<250 copies / mL). A negative result must be combined with clinical observations, patient history, and epidemiological information.  Fact Sheet for Patients:   StrictlyIdeas.no  Fact Sheet for Healthcare Providers: BankingDealers.co.za  This test is not yet approved or  cleared by the [place] FDA and has been authorized for detection and/or diagnosis of SARS-CoV-2 by FDA under an Emergency Use Authorization (EUA).  This EUA will remain in effect (meaning this test can be used) for the duration of the COVID-19 declaration under Section 564(b)(1) of the Act, 21 U.S.C. section 360bbb-3(b)(1), unless the authorization is terminated or revoked sooner.  Performed at [facility], [address] [place], [place] [address]   MRSA PCR Screening     Status: None   Collection Time: 12/27/19  8:45 PM   Specimen: Nasopharyngeal  Result Value Ref Range Status   MRSA by PCR NEGATIVE NEGATIVE Final    Comment:        The  GeneXpert MRSA Assay (FDA approved for NASAL specimens only), is one component of a comprehensive MRSA colonization surveillance program. It is not intended to diagnose MRSA infection nor to guide or monitor treatment for MRSA infections. Performed at [facility], [address] [place], [place] [address]   Urine  Culture     Status: Abnormal   Collection Time: 01/08/20 10:45 AM   Specimen: Urine, Random  Result Value Ref Range Status   Specimen Description   Final    URINE, RANDOM Performed at [facility], [address], [place], [place] [address]    Special Requests   Final    NONE Performed at [facility], [address] [place], [place] [address]    Culture >=100,000 COLONIES/mL YEAST (A)  Final   Report Status 01/09/2020 FINAL  Final    Coagulation Studies: No results for input(s): LABPROT, INR in the last 72 hours.  Urinalysis: No results for input(s): COLORURINE, LABSPEC, PHURINE, GLUCOSEU, HGBUR, BILIRUBINUR, KETONESUR, PROTEINUR, UROBILINOGEN, NITRITE, LEUKOCYTESUR in the last 72 hours.  Invalid input(s): APPERANCEUR    Imaging: US RENAL  Result Date: 01/10/2020 CLINICAL DATA:  Acute kidney failure. EXAM: RENAL / URINARY TRACT ULTRASOUND COMPLETE COMPARISON:  Renal ultrasound 07/30/2015 FINDINGS: Right Kidney: Renal measurements: 12.1 x 5.9 x 6.3 cm = volume: 238 mL . Echogenicity within normal limits. There is a cyst in the superior pole measuring 1.1 x 1.0 x 1.0 cm. There is another cyst in the inferior pole measuring 2.3 x 2.1 x 2.4 cm. Left Kidney: Renal measurements: 12.1 x 5.6 x 6.5 cm = volume: 230 mL. Echogenicity within normal limits. No mass or hydronephrosis visualized. Bladder: Not visualized.  Patient has suprapubic catheter. Other: None. IMPRESSION: No acute finding sonographically in the bilateral kidneys. Electronically Signed   By: Audie Pinto M.D.   On: 01/10/2020 12:46   ECHOCARDIOGRAM COMPLETE  Result  Date: 01/11/2020    ECHOCARDIOGRAM REPORT   Patient Name:   [name] Date of Exam: 01/10/2020 Medical Rec #:  [id number]      Height:       74.0 in Accession #:    [id number]     Weight:       167.0 lb Date of Birth:  Jan 07, 1941      BSA:          2.013 m Patient Age:    79 years       BP:           108/79 mmHg Patient Gender: M              HR:           88 bpm. Exam Location:  [facility] Procedure: 2D Echo, Cardiac Doppler and Color Doppler Indications:     CHF-Acute Systolic 878.67 / E72.09  History:         Patient has no prior history of Echocardiogram examinations.                  Risk Factors:Hypertension.  Sonographer:     Alyse Low Roar Referring Phys:  [id number] [facility] Diagnosing Phys: Bartholome Bill MD IMPRESSIONS  1. Left ventricular ejection fraction, by estimation, is 30 to 35%. The left ventricle has moderately decreased function. The left ventricle demonstrates global hypokinesis. The left ventricular internal cavity size was mildly dilated. Left ventricular diastolic parameters were normal.  2. Right ventricular systolic function was not well visualized. The right ventricular size is mildly enlarged. There is moderately elevated pulmonary artery systolic pressure.  3. Left atrial size was mildly dilated.  4. The mitral valve is grossly normal. Mild mitral valve regurgitation.  5. Tricuspid valve regurgitation is mild to moderate.  6. The aortic valve is grossly normal. Aortic valve regurgitation is trivial. FINDINGS  Left Ventricle: Left  ventricular ejection fraction, by estimation, is 30 to 35%. The left ventricle has moderately decreased function. The left ventricle demonstrates global hypokinesis. The left ventricular internal cavity size was mildly dilated. There is no left ventricular hypertrophy. Left ventricular diastolic parameters were normal. Right Ventricle: The right ventricular size is mildly enlarged. No increase in right ventricular wall thickness. Right ventricular systolic function  was not well visualized. There is moderately elevated pulmonary artery systolic pressure. The tricuspid regurgitant velocity is 3.08 m/s, and with an assumed right atrial pressure of 10 mmHg, the estimated right ventricular systolic pressure is 91.4 mmHg. Left Atrium: Left atrial size was mildly dilated. Right Atrium: Right atrial size was not well visualized. Pericardium: There is no evidence of pericardial effusion. Mitral Valve: The mitral valve is grossly normal. Mild mitral valve regurgitation. Tricuspid Valve: The tricuspid valve is not well visualized. Tricuspid valve regurgitation is mild to moderate. Aortic Valve: The aortic valve is grossly normal. Aortic valve regurgitation is trivial. Aortic valve mean gradient measures 2.0 mmHg. Aortic valve peak gradient measures 3.1 mmHg. Aortic valve area, by VTI measures 1.94 cm. Pulmonic Valve: The pulmonic valve was not well visualized. Pulmonic valve regurgitation is trivial. Aorta: The aortic root is normal in size and structure. IAS/Shunts: The interatrial septum was not assessed.  LEFT VENTRICLE PLAX 2D LVIDd:         5.49 cm  Diastology LVIDs:         4.80 cm  LV e' lateral:   6.20 cm/s LV PW:         1.27 cm  LV E/e' lateral: 23.5 LV IVS:        1.20 cm  LV e' medial:    5.11 cm/s LVOT diam:     2.00 cm  LV E/e' medial:  28.6 LV SV:         28 LV SV Index:   14 LVOT Area:     3.14 cm  RIGHT VENTRICLE RV S prime:     5.87 cm/s LEFT ATRIUM             Index       RIGHT ATRIUM           Index LA diam:        4.70 cm 2.33 cm/m  RA Area:     25.90 cm LA Vol (A2C):   80.3 ml 39.89 ml/m RA Volume:   90.50 ml  44.95 ml/m LA Vol (A4C):   84.6 ml 42.02 ml/m LA Biplane Vol: 85.4 ml 42.42 ml/m  AORTIC VALVE                   PULMONIC VALVE AV Area (Vmax):    2.01 cm    PV Vmax:        0.65 m/s AV Area (Vmean):   1.67 cm    PV Peak grad:   1.7 mmHg AV Area (VTI):     1.94 cm    RVOT Peak grad: 1 mmHg AV Vmax:           87.50 cm/s AV Vmean:          65.600 cm/s  AV VTI:            0.142 m AV Peak Grad:      3.1 mmHg AV Mean Grad:      2.0 mmHg LVOT Vmax:         55.90 cm/s LVOT Vmean:        34.900  cm/s LVOT VTI:          0.088 m LVOT/AV VTI ratio: 0.62  AORTA Ao Root diam: 3.00 cm MITRAL VALVE                TRICUSPID VALVE MV Area (PHT): 5.31 cm     TR Peak grad:   37.9 mmHg MV Decel Time: 143 msec     TR Vmax:        308.00 cm/s MV E velocity: 146.00 cm/s                             SHUNTS                             Systemic VTI:  0.09 m                             Systemic Diam: 2.00 cm Bartholome Bill MD Electronically signed by Bartholome Bill MD Signature Date/Time: 01/11/2020/8:57:24 AM    Final      Medications:   . sodium chloride 250 mL (01/05/20 0616)   . aspirin EC  81 mg Oral Daily  . Chlorhexidine Gluconate Cloth  6 each Topical Daily  . docusate sodium  100 mg Oral BID  . feeding supplement (NEPRO CARB STEADY)  237 mL Oral TID BM  . ferrous sulfate  325 mg Oral Q breakfast  . fluticasone  2 spray Each Nare Daily  . furosemide  40 mg Intravenous Q12H  . gabapentin  100 mg Oral QHS  . heparin  5,000 Units Subcutaneous Q8H  . insulin aspart  0-15 Units Subcutaneous TID WC  . lidocaine  1 patch Transdermal Q24H  . loratadine  10 mg Oral Daily  . multivitamin with minerals  1 tablet Oral Daily  . pantoprazole  40 mg Oral Daily  . patiromer  16.8 g Oral Daily  . polyethylene glycol  17 g Oral Daily  . simethicone  80 mg Oral QID  . simvastatin  10 mg Oral QHS  . sodium chloride flush  3 mL Intravenous Once  . tamsulosin  0.4 mg Oral Daily  . vitamin B-12  1,000 mcg Oral Daily   sodium chloride, acetaminophen **OR** acetaminophen, alum & mag hydroxide-simeth, calcium carbonate, guaiFENesin-dextromethorphan, ondansetron **OR** ondansetron (ZOFRAN) IV, oxyCODONE, polyvinyl alcohol, promethazine, sodium phosphate  Assessment/ Plan:   Mr. ANTINO MAYABB is a 79 y.o. 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 Lucky Cowboy  1. Acute renal failure with hyperkalemia and metabolic acidosis on chronic kidney diease stage IV with baseline creatinine of 2.24, GFR of 26 on 02/11/2018. Chronic kidney disease secondary to diabetic nephropathy, hypertension and obstructive uropathy.  No IV contrast. Renal ultrasound with no obstruction.  Acute renal failure seems to be related to concurrent illness No indication for dialysis today. Discussed dialysis with patient today.  - Check SPEP and UPEP due history of abnormal  levels.  - start potassium binder  2. Hypertension: with peripheral edema. New onset systolic congestive heart failure Appreciate cardiology input.  - IV furosemide - IV albumin   3. Hyponatremia: secondary to volume overload and renal failure.  - loop diuretics as above. Improved   LOS: 16 Aneshia Jacquet 7/23/202111:48 AM

## 2020-01-11 NOTE — Consult Note (Signed)
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 Patient was started on IV vancomycin, Rocephin and Flagyl, podiatry and vascular surgeon were consulted. Patient had a angiogram and revascularization on 7/12. But the foot gangrene does not seem to be improving, as a result, planning for below-knee amputation. 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 Cariology saw patient and recommendation for milrinone drip with diuresis. PCCM consult for further evaluation and management.   Lines/tubes : Suprapubic Catheter (Active)  Site Assessment Clean 01/11/20 0851  Dressing Status None 01/11/20 0851  Dressing Type Other (Comment) 01/09/20 2001  Collection Container Leg bag 01/11/20 0851  Securement Method Ties;Securement Device 01/11/20 0851  Output (mL) 225 mL 01/11/20 1429    Microbiology/Sepsis markers: Results for orders placed or performed during the hospital encounter of 01/08/2020  Blood Cultures x 2 sites     Status: None   Collection Time: 12/29/2019  8:48 PM   Specimen: BLOOD  Result Value Ref Range Status   Specimen Description BLOOD BLOOD LEFT FOREARM  Final   Special Requests   Final    BOTTLES DRAWN AEROBIC AND  ANAEROBIC Blood Culture adequate volume   Culture   Final    NO GROWTH 5 DAYS Performed at [facility], [address] [place], [place] [address]    Report Status 01/12/2020 FINAL  Final  Culture, blood (Routine X 2) w Reflex to ID Panel     Status: None   Collection Time: 12/27/19  7:01 PM   Specimen: BLOOD  Result Value Ref Range Status   Specimen Description BLOOD BLOOD RIGHT HAND  Final   Special Requests   Final    BOTTLES DRAWN AEROBIC AND ANAEROBIC Blood Culture adequate volume   Culture   Final    NO GROWTH 5 DAYS Performed at [facility], [address] [place], [place] [address]    Report Status 01/01/2020 FINAL  Final  SARS Coronavirus 2 by RT PCR (hospital order, performed in [facility] hospital lab) Nasopharyngeal Nasopharyngeal Swab     Status: None   Collection Time: 12/27/19  8:45 PM   Specimen: Nasopharyngeal Swab  Result Value Ref Range Status   SARS Coronavirus 2 NEGATIVE NEGATIVE Final    Comment: (NOTE) SARS-CoV-2 target nucleic acids are NOT DETECTED.  The SARS-CoV-2 RNA is generally detectable in upper and lower respiratory specimens during the acute phase of infection. The lowest concentration of SARS-CoV-2 viral copies this assay can detect is 250 copies / mL. A negative result does not preclude SARS-CoV-2 infection and should not be used as the sole basis for treatment or other patient management decisions.  A negative result may occur with improper specimen collection / handling, submission of specimen other than nasopharyngeal swab, presence of viral mutation(s) within the areas targeted by this assay, and inadequate number of viral copies (<250 copies / mL). A negative result must be combined with clinical observations, patient history, and epidemiological information.  Fact Sheet for Patients:   StrictlyIdeas.no  Fact Sheet for Healthcare  Providers: BankingDealers.co.za  This test is not yet approved or  cleared by the [place] FDA and has been authorized for detection and/or diagnosis of SARS-CoV-2 by FDA under an Emergency Use Authorization (EUA).  This EUA will remain in effect (meaning this test can be used) for the duration of the COVID-19 declaration under Section 564(b)(1) of the Act, 21 U.S.C. section 360bbb-3(b)(1), unless the authorization is terminated or revoked sooner.  Performed at [facility], [address] [place], [place] [address]   MRSA PCR Screening     Status: None   Collection Time: 12/27/19  8:45 PM   Specimen: Nasopharyngeal  Result Value Ref Range Status   MRSA by PCR NEGATIVE NEGATIVE Final    Comment:        The GeneXpert MRSA Assay (FDA approved for NASAL specimens only), is one component of a comprehensive MRSA colonization surveillance program. It is not intended to diagnose MRSA infection nor to guide or monitor treatment for MRSA infections. Performed at [facility], [address], [place], [place] [address]   Urine Culture     Status: Abnormal   Collection Time: 01/08/20 10:45 AM   Specimen: Urine, Random  Result Value Ref Range Status   Specimen Description   Final    URINE, RANDOM Performed at [facility], [address] [place], [place] [address]    Special Requests   Final    NONE Performed at [facility], [address] [place], [place] [address]    Culture >=100,000 COLONIES/mL YEAST (A)  Final   Report Status 01/09/2020 FINAL  Final    Anti-infectives:  Anti-infectives (From admission, onward)   Start     Dose/Rate Route Frequency Ordered Stop   01/09/20 1600  fluconazole (DIFLUCAN) IVPB 100 mg  Status:  Discontinued        100 mg 50 mL/hr over 60 Minutes Intravenous Every 24 hours 01/09/20 1558 01/09/20 1601   01/08/20 1300  meropenem (MERREM) 1,000 mg in sodium chloride 0.9 % 100 mL  IVPB  Status:  Discontinued        1,000 mg 200 mL/hr over 30 Minutes Intravenous Every 12 hours 01/08/20 1120 01/09/20 1140   01/06/20 2100  vancomycin (VANCOREADY) IVPB 750 mg/150 mL  Status:  Discontinued        750 mg 150 mL/hr over 60 Minutes Intravenous Every 24 hours 01/06/20 0614 01/06/20 0847   01/06/20 0800  Ampicillin-Sulbactam (UNASYN) 3 g in sodium chloride 0.9 % 100 mL IVPB  Status:  Discontinued        3 g 200 mL/hr over 30 Minutes Intravenous Every 12 hours 01/06/20 0625 01/06/20 0847   01/06/20 0613  Ampicillin-Sulbactam (UNASYN) 3 g in sodium chloride 0.9 % 100 mL IVPB  Status:  Discontinued        3 g 200 mL/hr over 30 Minutes Intravenous Every 12 hours 01/06/20 0614 01/06/20 0625   01/04/20 1800  Ampicillin-Sulbactam (UNASYN) 3 g in sodium chloride 0.9 % 100 mL IVPB  Status:  Discontinued        3 g 200 mL/hr over 30 Minutes Intravenous Every 12 hours 01/04/20 0912 01/06/20 0614   01/10/2020 1008  ceFAZolin (ANCEF) 2-4 GM/100ML-% IVPB       Note to Pharmacy: Leonia Reader   : cabinet override      12/22/2019 1008 01/04/2020 2214   01/15/2020 1007  levofloxacin (LEVAQUIN) 500 MG/100ML IVPB  Status:  Discontinued  Note to Pharmacy: Leonia Reader   : cabinet override      12/28/2019 1007 12/28/2019 1620   01/05/2020 1215  ceFAZolin (ANCEF) IVPB 1 g/50 mL premix        1 g 100 mL/hr over 30 Minutes Intravenous  Once 01/16/2020 1206 01/12/2020 1039   01/16/2020 1200  ceFAZolin (ANCEF) 1,000 mg in dextrose 5 % 100 mL IVPB  Status:  Discontinued        1,000 mg 220 mL/hr over 30 Minutes Intravenous  Once 12/27/2019 1159 01/08/2020 1205   01/05/2020 1000  ceFAZolin (ANCEF) IVPB 2g/100 mL premix  Status:  Discontinued        2 g 200 mL/hr over 30 Minutes Intravenous  Once 01/12/2020 0932 01/08/2020 1159   12/30/19 0830  metroNIDAZOLE (FLAGYL) IVPB 500 mg  Status:  Discontinued        500 mg 100 mL/hr over 60 Minutes Intravenous Every 8 hours 12/30/19 0822 01/04/20 0912   12/27/19 1900   vancomycin (VANCOCIN) IVPB 1000 mg/200 mL premix  Status:  Discontinued        1,000 mg 200 mL/hr over 60 Minutes Intravenous Every 24 hours 12/22/2019 2043 12/27/19 1217   12/27/19 1800  vancomycin (VANCOREADY) IVPB 750 mg/150 mL  Status:  Discontinued        750 mg 150 mL/hr over 60 Minutes Intravenous Every 24 hours 12/27/19 1217 01/06/20 0614   01/05/2020 2200  metroNIDAZOLE (FLAGYL) tablet 500 mg  Status:  Discontinued        500 mg Oral Every 8 hours 01/08/2020 2009 12/30/19 0822   12/22/2019 2045  vancomycin (VANCOREADY) IVPB 500 mg/100 mL        500 mg 100 mL/hr over 60 Minutes Intravenous  Once 12/28/2019 2040 12/27/19 0000   12/30/2019 2015  cefTRIAXone (ROCEPHIN) 2 g in sodium chloride 0.9 % 100 mL IVPB  Status:  Discontinued        2 g 200 mL/hr over 30 Minutes Intravenous Every 24 hours 01/09/2020 2009 01/04/20 0912   12/30/2019 1815  vancomycin (VANCOCIN) IVPB 1000 mg/200 mL premix        1,000 mg 200 mL/hr over 60 Minutes Intravenous  Once 12/23/2019 1801 12/21/2019 2023   01/02/2020 1815  piperacillin-tazobactam (ZOSYN) IVPB 3.375 g        3.375 g 100 mL/hr over 30 Minutes Intravenous  Once 12/27/2019 1801 01/15/2020 1953       Consults: Treatment Team:  Minna Merritts, MD Lin Landsman, MD     PAST MEDICAL HISTORY   Past Medical History:  Diagnosis Date  . Carcinoma of skin   . Cauda equina compression ([place])   . Diabetic retinopathy ([place])   . GERD (gastroesophageal reflux disease)   . Hypertension      SURGICAL HISTORY   Past Surgical History:  Procedure Laterality Date  . AMPUTATION Left 12/27/2019   Procedure: LEFT BELOW KNEE AMPUTATION;  Surgeon: Algernon Huxley, MD;  Location: [facility] ORS;  Service: General;  Laterality: Left;  . BACK SURGERY  07/2012  . CARDIAC CATHETERIZATION  2005  . CATARACT EXTRACTION  2014   right eye   . CORONARY ARTERY BYPASS GRAFT  2005    CABG x 7 at [place]     left knee  . LOWER EXTREMITY ANGIOGRAPHY Left  12/23/2019   Procedure: Lower Extremity Angiography;  Surgeon: Algernon Huxley, MD;  Location: [facility];  Service: Cardiovascular;  Laterality: Left;  FAMILY HISTORY   Family History  Problem Relation Age of Onset  . Hypertension Sister   . Hyperlipidemia Sister      SOCIAL HISTORY   Social History   Tobacco Use  . Smoking status: Former Smoker    Years: 45.00    Types: Pipe    Quit date: 2016    Years since quitting: 5.5  . Smokeless tobacco: Former Network engineer Use Topics  . Alcohol use: Yes    Alcohol/week: 0.0 standard drinks    Comment: occasional.  . Drug use: No     MEDICATIONS   Current Medication:  Current Facility-Administered Medications:  .  0.9 %  sodium chloride infusion, , Intravenous, PRN, Sharen Hones, MD, Last Rate: 10 mL/hr at 01/05/20 0616, 250 mL at 01/05/20 0616 .  acetaminophen (TYLENOL) tablet 650 mg, 650 mg, Oral, Q6H PRN, 650 mg at 01/08/20 1557 **OR** acetaminophen (TYLENOL) suppository 650 mg, 650 mg, Rectal, Q6H PRN, Dew, Erskine Squibb, MD .  albumin human 25 % solution 25 g, 25 g, Intravenous, Q12H, Kolluru, Sarath, MD, Last Rate: 60 mL/hr at 01/11/20 1445, 25 g at 01/11/20 1445 .  alum & mag hydroxide-simeth (MAALOX/MYLANTA) 200-200-20 MG/5ML suspension 15 mL, 15 mL, Oral, Q6H PRN, Algernon Huxley, MD, 15 mL at 01/04/20 2303 .  aspirin EC tablet 81 mg, 81 mg, Oral, Daily, Algernon Huxley, MD, 81 mg at 01/10/20 0837 .  calcium carbonate (TUMS - dosed in mg elemental calcium) chewable tablet 200 mg of elemental calcium, 1 tablet, Oral, TID PRN, Algernon Huxley, MD, 200 mg of elemental calcium at 01/11/20 0832 .  Chlorhexidine Gluconate Cloth 2 % PADS 6 each, 6 each, Topical, Daily, Dew, Erskine Squibb, MD, 6 each at 01/11/20 1300 .  docusate sodium (COLACE) capsule 100 mg, 100 mg, Oral, BID, Nolberto Hanlon, MD, 100 mg at 01/10/20 2222 .  feeding supplement (NEPRO CARB STEADY) liquid 237 mL, 237 mL, Oral, TID BM, Nolberto Hanlon, MD, 237 mL at 01/10/20  2038 .  ferrous sulfate tablet 325 mg, 325 mg, Oral, Q breakfast, Sharen Hones, MD, 325 mg at 01/11/20 2563 .  fluticasone (FLONASE) 50 MCG/ACT nasal spray 2 spray, 2 spray, Each Nare, Daily, Dew, Erskine Squibb, MD, 2 spray at 01/11/20 1445 .  furosemide (LASIX) injection 40 mg, 40 mg, Intravenous, Q12H, Kolluru, Sarath, MD, 40 mg at 01/11/20 0832 .  gabapentin (NEURONTIN) capsule 100 mg, 100 mg, Oral, QHS, Dew, Erskine Squibb, MD, 100 mg at 01/10/20 2222 .  guaiFENesin-dextromethorphan (ROBITUSSIN DM) 100-10 MG/5ML syrup 10 mL, 10 mL, Oral, Q6H PRN, Algernon Huxley, MD .  heparin injection 5,000 Units, 5,000 Units, Subcutaneous, Q8H, Sharen Hones, MD, 5,000 Units at 01/11/20 1445 .  insulin aspart (novoLOG) injection 0-15 Units, 0-15 Units, Subcutaneous, TID WC, Algernon Huxley, MD, 5 Units at 01/11/20 1435 .  lidocaine (LIDODERM) 5 % 1 patch, 1 patch, Transdermal, Q24H, Dew, Erskine Squibb, MD, 1 patch at 01/10/20 1454 .  loratadine (CLARITIN) tablet 10 mg, 10 mg, Oral, Daily, Dew, Erskine Squibb, MD, 10 mg at 01/10/20 0837 .  milrinone (PRIMACOR) 20 MG/100 ML (0.2 mg/mL) infusion, 0.3 mcg/kg/min, Intravenous, Continuous, Amery, Sahar, MD .  multivitamin with minerals tablet 1 tablet, 1 tablet, Oral, Daily, Dew, Erskine Squibb, MD, 1 tablet at 01/10/20 [phone] .  ondansetron (ZOFRAN) tablet 4 mg, 4 mg, Oral, Q6H PRN, 4 mg at 01/09/20 1508 **OR** ondansetron (ZOFRAN) injection 4 mg, 4 mg, Intravenous, Q6H PRN, Dew, Erskine Squibb, MD, 4 mg  at 01/11/20 1217 .  oxyCODONE (Oxy IR/ROXICODONE) immediate release tablet 5 mg, 5 mg, Oral, Q6H PRN, Lang Snow, NP, 5 mg at 01/11/20 0831 .  pantoprazole (PROTONIX) EC tablet 40 mg, 40 mg, Oral, Daily, Dew, Erskine Squibb, MD, 40 mg at 01/10/20 0837 .  patiromer Daryll Drown) packet 16.8 g, 16.8 g, Oral, Daily, Kolluru, Sarath, MD .  polyethylene glycol (MIRALAX / GLYCOLAX) packet 17 g, 17 g, Oral, Daily, [name], Sahar, MD, 17 g at 01/10/20 1501 .  polyvinyl alcohol (LIQUIFILM TEARS) 1.4 % ophthalmic  solution 2 drop, 2 drop, Both Eyes, PRN, Dew, Erskine Squibb, MD .  promethazine (PHENERGAN) injection 12.5 mg, 12.5 mg, Intravenous, Q6H PRN, Algernon Huxley, MD, 12.5 mg at 01/11/20 1027 .  simethicone (MYLICON) chewable tablet 80 mg, 80 mg, Oral, QID, Nolberto Hanlon, MD, 80 mg at 01/10/20 2222 .  simvastatin (ZOCOR) tablet 10 mg, 10 mg, Oral, QHS, Dew, Erskine Squibb, MD, 10 mg at 01/10/20 2222 .  sodium chloride flush (NS) 0.9 % injection 3 mL, 3 mL, Intravenous, Once, Dew, Erskine Squibb, MD .  sodium phosphate (FLEET) 7-19 GM/118ML enema 1 enema, 1 enema, Rectal, Daily PRN, Algernon Huxley, MD, 1 enema at 01/11/20 1438 .  tamsulosin (FLOMAX) capsule 0.4 mg, 0.4 mg, Oral, Daily, Dew, Erskine Squibb, MD, 0.4 mg at 01/10/20 0837 .  vitamin B-12 (CYANOCOBALAMIN) tablet 1,000 mcg, 1,000 mcg, Oral, Daily, Sharen Hones, MD, 1,000 mcg at 01/10/20 2536    ALLERGIES   Ivp dye [iodinated diagnostic agents], Other, and Codeine    REVIEW OF SYSTEMS   10 point ROS unable to perform due to mild AMS  PHYSICAL EXAMINATION   Vital Signs: Temp:  [97.9 F (36.6 C)-98.1 F (36.7 C)] 98.1 F (36.7 C) (07/23 1521) Pulse Rate:  [86-89] 86 (07/23 1521) Resp:  [14-19] 17 (07/23 1521) BP: (106-119)/(61-66) 116/61 (07/23 1521) SpO2:  [95 %-100 %] 100 % (07/23 1521)  GENERAL:Age appropirate HEAD: Normocephalic, atraumatic.  EYES: Pupils equal, round, reactive to light.  No scleral icterus.  MOUTH: Moist mucosal membrane. NECK: Supple. No thyromegaly. No nodules. No JVD.  PULMONARY: crackles bilaterally  CARDIOVASCULAR: S1 and S2. Regular rate and rhythm. No murmurs, rubs, or gallops.  GASTROINTESTINAL: Soft, nontender, non-distended. No masses. Positive bowel sounds. No hepatosplenomegaly. +suprapubic catheter MUSCULOSKELETAL: No swelling, clubbing, or edema.  NEUROLOGIC: Mild distress due to acute illness SKIN:intact,warm,dry   PERTINENT DATA     Infusions: . sodium chloride 250 mL (01/05/20 0616)  . albumin human 25 g  (01/11/20 1445)  . milrinone     Scheduled Medications: . aspirin EC  81 mg Oral Daily  . Chlorhexidine Gluconate Cloth  6 each Topical Daily  . docusate sodium  100 mg Oral BID  . feeding supplement (NEPRO CARB STEADY)  237 mL Oral TID BM  . ferrous sulfate  325 mg Oral Q breakfast  . fluticasone  2 spray Each Nare Daily  . furosemide  40 mg Intravenous Q12H  . gabapentin  100 mg Oral QHS  . heparin  5,000 Units Subcutaneous Q8H  . insulin aspart  0-15 Units Subcutaneous TID WC  . lidocaine  1 patch Transdermal Q24H  . loratadine  10 mg Oral Daily  . multivitamin with minerals  1 tablet Oral Daily  . pantoprazole  40 mg Oral Daily  . patiromer  16.8 g Oral Daily  . polyethylene glycol  17 g Oral Daily  . simethicone  80 mg Oral QID  . simvastatin  10  mg Oral QHS  . sodium chloride flush  3 mL Intravenous Once  . tamsulosin  0.4 mg Oral Daily  . vitamin B-12  1,000 mcg Oral Daily   PRN Medications: sodium chloride, acetaminophen **OR** acetaminophen, alum & mag hydroxide-simeth, calcium carbonate, guaiFENesin-dextromethorphan, ondansetron **OR** ondansetron (ZOFRAN) IV, oxyCODONE, polyvinyl alcohol, promethazine, sodium phosphate Hemodynamic parameters:   Intake/Output: 07/22 0701 - 07/23 0700 In: 240 [P.O.:240] Out: 530 [Urine:530]  Ventilator  Settings:     LAB RESULTS:  Basic Metabolic Panel: Recent Labs  Lab 01/05/20 0444 01/05/20 1257 01/06/20 0513 01/06/20 0513 01/07/20 0309 01/07/20 0309 01/09/20 0446 01/09/20 0446 01/10/20 0526 01/10/20 0526 01/10/20 2146 01/11/20 0443  NA 131*  --  132*   < > 131*  --  127*  --  128*  --  125* 127*  K 5.8*   < > 4.3   < > 3.8   < > 4.9   < > 5.4*   < > 5.4* 5.9*  CL 102  --  102   < > 102  --  95*  --  98  --  94* 92*  CO2 20*  --  22   < > 23  --  19*  --  16*  --  20* 20*  GLUCOSE 153*  --  195*   < > 60*  --  195*  --  223*  --  239* 296*  BUN 78*  --  74*   < > 78*  --  93*  --  92*  --  98* 98*  CREATININE  2.27*  --  2.11*   < > 1.93*  --  2.81*  --  3.07*  --  3.43* 3.55*  CALCIUM 8.1*  --  7.5*   < > 7.6*  --  7.7*  --  7.6*  --  7.8* 8.0*  MG 2.1  --  2.0  --  2.0  --  2.1  --   --   --  2.2  --    < > = values in this interval not displayed.   Liver Function Tests: Recent Labs  Lab 01/11/20 0443  AST 158*  ALT 99*  ALKPHOS 239*  BILITOT 1.3*  PROT 6.2*  ALBUMIN 2.5*   No results for input(s): LIPASE, AMYLASE in the last 168 hours. No results for input(s): AMMONIA in the last 168 hours. CBC: Recent Labs  Lab 01/05/20 0444 01/06/20 0513 01/07/20 0309 01/09/20 0446 01/11/20 0443  WBC 22.4* 14.7* 15.0* 12.8* 14.4*  NEUTROABS 16.9* 10.9* 10.9* 9.9*  --   HGB 8.7* 8.2* 8.1* 8.5* 8.3*  HCT 26.4* 23.9* 23.9* 25.7* 26.7*  MCV 78.3* 78.9* 79.1* 81.3 85.0  PLT 542* 497* 550* 570* 550*   Cardiac Enzymes: No results for input(s): CKTOTAL, CKMB, CKMBINDEX, TROPONINI in the last 168 hours. BNP: Invalid input(s): POCBNP CBG: Recent Labs  Lab 01/10/20 1136 01/10/20 1633 01/10/20 2101 01/11/20 0759 01/11/20 1236  GLUCAP 177* 213* 210* 283* 226*       IMAGING RESULTS:  Imaging: US RENAL  Result Date: 01/10/2020 CLINICAL DATA:  Acute kidney failure. EXAM: RENAL / URINARY TRACT ULTRASOUND COMPLETE COMPARISON:  Renal ultrasound 07/30/2015 FINDINGS: Right Kidney: Renal measurements: 12.1 x 5.9 x 6.3 cm = volume: 238 mL . Echogenicity within normal limits. There is a cyst in the superior pole measuring 1.1 x 1.0 x 1.0 cm. There is another cyst in the inferior pole measuring 2.3 x 2.1 x 2.4 cm. Left Kidney: Renal  measurements: 12.1 x 5.6 x 6.5 cm = volume: 230 mL. Echogenicity within normal limits. No mass or hydronephrosis visualized. Bladder: Not visualized.  Patient has suprapubic catheter. Other: None. IMPRESSION: No acute finding sonographically in the bilateral kidneys. Electronically Signed   By: Audie Pinto M.D.   On: 01/10/2020 12:46   ECHOCARDIOGRAM  COMPLETE  Result Date: 01/11/2020    ECHOCARDIOGRAM REPORT   Patient Name:   [name] Date of Exam: 01/10/2020 Medical Rec #:  [id number]      Height:       74.0 in Accession #:    [id number]     Weight:       167.0 lb Date of Birth:  1941/05/09      BSA:          2.013 m Patient Age:    41 years       BP:           108/79 mmHg Patient Gender: M              HR:           88 bpm. Exam Location:  [facility] Procedure: 2D Echo, Cardiac Doppler and Color Doppler Indications:     CHF-Acute Systolic 619.50 / D32.67  History:         Patient has no prior history of Echocardiogram examinations.                  Risk Factors:Hypertension.  Sonographer:     Alyse Low Roar Referring Phys:  [id number] [facility] Diagnosing Phys: Bartholome Bill MD IMPRESSIONS  1. Left ventricular ejection fraction, by estimation, is 30 to 35%. The left ventricle has moderately decreased function. The left ventricle demonstrates global hypokinesis. The left ventricular internal cavity size was mildly dilated. Left ventricular diastolic parameters were normal.  2. Right ventricular systolic function was not well visualized. The right ventricular size is mildly enlarged. There is moderately elevated pulmonary artery systolic pressure.  3. Left atrial size was mildly dilated.  4. The mitral valve is grossly normal. Mild mitral valve regurgitation.  5. Tricuspid valve regurgitation is mild to moderate.  6. The aortic valve is grossly normal. Aortic valve regurgitation is trivial. FINDINGS  Left Ventricle: Left ventricular ejection fraction, by estimation, is 30 to 35%. The left ventricle has moderately decreased function. The left ventricle demonstrates global hypokinesis. The left ventricular internal cavity size was mildly dilated. There is no left ventricular hypertrophy. Left ventricular diastolic parameters were normal. Right Ventricle: The right ventricular size is mildly enlarged. No increase in right ventricular wall thickness. Right ventricular  systolic function was not well visualized. There is moderately elevated pulmonary artery systolic pressure. The tricuspid regurgitant velocity is 3.08 m/s, and with an assumed right atrial pressure of 10 mmHg, the estimated right ventricular systolic pressure is 99.8 mmHg. Left Atrium: Left atrial size was mildly dilated. Right Atrium: Right atrial size was not well visualized. Pericardium: There is no evidence of pericardial effusion. Mitral Valve: The mitral valve is grossly normal. Mild mitral valve regurgitation. Tricuspid Valve: The tricuspid valve is not well visualized. Tricuspid valve regurgitation is mild to moderate. Aortic Valve: The aortic valve is grossly normal. Aortic valve regurgitation is trivial. Aortic valve mean gradient measures 2.0 mmHg. Aortic valve peak gradient measures 3.1 mmHg. Aortic valve area, by VTI measures 1.94 cm. Pulmonic Valve: The pulmonic valve was not well visualized. Pulmonic valve regurgitation is trivial. Aorta: The aortic root is normal in size and structure.  IAS/Shunts: The interatrial septum was not assessed.  LEFT VENTRICLE PLAX 2D LVIDd:         5.49 cm  Diastology LVIDs:         4.80 cm  LV e' lateral:   6.20 cm/s LV PW:         1.27 cm  LV E/e' lateral: 23.5 LV IVS:        1.20 cm  LV e' medial:    5.11 cm/s LVOT diam:     2.00 cm  LV E/e' medial:  28.6 LV SV:         28 LV SV Index:   14 LVOT Area:     3.14 cm  RIGHT VENTRICLE RV S prime:     5.87 cm/s LEFT ATRIUM             Index       RIGHT ATRIUM           Index LA diam:        4.70 cm 2.33 cm/m  RA Area:     25.90 cm LA Vol (A2C):   80.3 ml 39.89 ml/m RA Volume:   90.50 ml  44.95 ml/m LA Vol (A4C):   84.6 ml 42.02 ml/m LA Biplane Vol: 85.4 ml 42.42 ml/m  AORTIC VALVE                   PULMONIC VALVE AV Area (Vmax):    2.01 cm    PV Vmax:        0.65 m/s AV Area (Vmean):   1.67 cm    PV Peak grad:   1.7 mmHg AV Area (VTI):     1.94 cm    RVOT Peak grad: 1 mmHg AV Vmax:           87.50 cm/s AV Vmean:           65.600 cm/s AV VTI:            0.142 m AV Peak Grad:      3.1 mmHg AV Mean Grad:      2.0 mmHg LVOT Vmax:         55.90 cm/s LVOT Vmean:        34.900 cm/s LVOT VTI:          0.088 m LVOT/AV VTI ratio: 0.62  AORTA Ao Root diam: 3.00 cm MITRAL VALVE                TRICUSPID VALVE MV Area (PHT): 5.31 cm     TR Peak grad:   37.9 mmHg MV Decel Time: 143 msec     TR Vmax:        308.00 cm/s MV E velocity: 146.00 cm/s                             SHUNTS                             Systemic VTI:  0.09 m                             Systemic Diam: 2.00 cm Bartholome Bill MD Electronically signed by Bartholome Bill MD Signature Date/Time: 01/11/2020/8:57:24 AM    Final    @PROBHOSP @ ECHOCARDIOGRAM COMPLETE  Result Date: 01/11/2020    ECHOCARDIOGRAM REPORT   Patient  Name:   [name] Date of Exam: 01/10/2020 Medical Rec #:  [id number]      Height:       74.0 in Accession #:    [id number]     Weight:       167.0 lb Date of Birth:  1941-05-11      BSA:          2.013 m Patient Age:    50 years       BP:           108/79 mmHg Patient Gender: M              HR:           88 bpm. Exam Location:  [facility] Procedure: 2D Echo, Cardiac Doppler and Color Doppler Indications:     CHF-Acute Systolic 709.62 / E36.62  History:         Patient has no prior history of Echocardiogram examinations.                  Risk Factors:Hypertension.  Sonographer:     Alyse Low Roar Referring Phys:  [id number] [facility] Diagnosing Phys: Bartholome Bill MD IMPRESSIONS  1. Left ventricular ejection fraction, by estimation, is 30 to 35%. The left ventricle has moderately decreased function. The left ventricle demonstrates global hypokinesis. The left ventricular internal cavity size was mildly dilated. Left ventricular diastolic parameters were normal.  2. Right ventricular systolic function was not well visualized. The right ventricular size is mildly enlarged. There is moderately elevated pulmonary artery systolic pressure.  3. Left atrial size was mildly  dilated.  4. The mitral valve is grossly normal. Mild mitral valve regurgitation.  5. Tricuspid valve regurgitation is mild to moderate.  6. The aortic valve is grossly normal. Aortic valve regurgitation is trivial. FINDINGS  Left Ventricle: Left ventricular ejection fraction, by estimation, is 30 to 35%. The left ventricle has moderately decreased function. The left ventricle demonstrates global hypokinesis. The left ventricular internal cavity size was mildly dilated. There is no left ventricular hypertrophy. Left ventricular diastolic parameters were normal. Right Ventricle: The right ventricular size is mildly enlarged. No increase in right ventricular wall thickness. Right ventricular systolic function was not well visualized. There is moderately elevated pulmonary artery systolic pressure. The tricuspid regurgitant velocity is 3.08 m/s, and with an assumed right atrial pressure of 10 mmHg, the estimated right ventricular systolic pressure is 65.0 mmHg. Left Atrium: Left atrial size was mildly dilated. Right Atrium: Right atrial size was not well visualized. Pericardium: There is no evidence of pericardial effusion. Mitral Valve: The mitral valve is grossly normal. Mild mitral valve regurgitation. Tricuspid Valve: The tricuspid valve is not well visualized. Tricuspid valve regurgitation is mild to moderate. Aortic Valve: The aortic valve is grossly normal. Aortic valve regurgitation is trivial. Aortic valve mean gradient measures 2.0 mmHg. Aortic valve peak gradient measures 3.1 mmHg. Aortic valve area, by VTI measures 1.94 cm. Pulmonic Valve: The pulmonic valve was not well visualized. Pulmonic valve regurgitation is trivial. Aorta: The aortic root is normal in size and structure. IAS/Shunts: The interatrial septum was not assessed.  LEFT VENTRICLE PLAX 2D LVIDd:         5.49 cm  Diastology LVIDs:         4.80 cm  LV e' lateral:   6.20 cm/s LV PW:         1.27 cm  LV E/e' lateral: 23.5 LV IVS:  1.20 cm   LV e' medial:    5.11 cm/s LVOT diam:     2.00 cm  LV E/e' medial:  28.6 LV SV:         28 LV SV Index:   14 LVOT Area:     3.14 cm  RIGHT VENTRICLE RV S prime:     5.87 cm/s LEFT ATRIUM             Index       RIGHT ATRIUM           Index LA diam:        4.70 cm 2.33 cm/m  RA Area:     25.90 cm LA Vol (A2C):   80.3 ml 39.89 ml/m RA Volume:   90.50 ml  44.95 ml/m LA Vol (A4C):   84.6 ml 42.02 ml/m LA Biplane Vol: 85.4 ml 42.42 ml/m  AORTIC VALVE                   PULMONIC VALVE AV Area (Vmax):    2.01 cm    PV Vmax:        0.65 m/s AV Area (Vmean):   1.67 cm    PV Peak grad:   1.7 mmHg AV Area (VTI):     1.94 cm    RVOT Peak grad: 1 mmHg AV Vmax:           87.50 cm/s AV Vmean:          65.600 cm/s AV VTI:            0.142 m AV Peak Grad:      3.1 mmHg AV Mean Grad:      2.0 mmHg LVOT Vmax:         55.90 cm/s LVOT Vmean:        34.900 cm/s LVOT VTI:          0.088 m LVOT/AV VTI ratio: 0.62  AORTA Ao Root diam: 3.00 cm MITRAL VALVE                TRICUSPID VALVE MV Area (PHT): 5.31 cm     TR Peak grad:   37.9 mmHg MV Decel Time: 143 msec     TR Vmax:        308.00 cm/s MV E velocity: 146.00 cm/s                             SHUNTS                             Systemic VTI:  0.09 m                             Systemic Diam: 2.00 cm Bartholome Bill MD Electronically signed by Bartholome Bill MD Signature Date/Time: 01/11/2020/8:57:24 AM    Final      ASSESSMENT AND PLAN    -Multidisciplinary rounds held today  Acute decompensated systolic CHF with EF 26-33%  -Cardiology team on case - appreciate input - ischemic cardiomyopathy - recommendation for continued diuresis and milrinone gtt.  -complicated by severe TR and moderate MR -palliative consultation placed    Acute on chronic Renal Failure -most likely due to hypoperfusion from low effective circulating volume secondary to end stage CHF - nephrology on case - appreciate input -follow UO -continue diuresis per  nephro   Severe protein calorie  malnutrition  - bitemporal and peripheral muscle wating  - albumin <2.5  - repletion 1 amp 25% daily     S/p Left BKA   - monitor surgical site for infection   - monitor h/h   - adequate analgesia   Hypervolemic hypotonic hyponatremia   - due to CHF   - treating underlying decompensated HFrEF with ischemic cardiomyopathy   Sever hyperglycemia    - keep POC glucose <200 but >150 while in MICU   - adjusting weight based regimen -Lantus QHS>10units plus ACHS ISS  ID -continue IV abx as prescibed -follow up cultures  GI/Nutrition GI PROPHYLAXIS as indicated DIET-->TF's as tolerated Constipation protocol as indicated  ENDO - ICU hypoglycemic\Hyperglycemia protocol -check FSBS per protocol   ELECTROLYTES -follow labs as needed -replace as needed -pharmacy consultation   DVT/GI PRX ordered -SCDs  TRANSFUSIONS AS NEEDED MONITOR FSBS ASSESS the need for LABS as needed   Critical care provider statement:    Critical care time (minutes):  109   Critical care time was exclusive of:  Separately billable procedures and treating other patients   Critical care was necessary to treat or prevent imminent or life-threatening deterioration of the following conditions:  acute decompensated systolic CHF, renal failure, protein calorie malnutrition , multiple comorbid conditions   Critical care was time spent personally by me on the following activities:  Development of treatment plan with patient or surrogate, discussions with consultants, evaluation of patient's response to treatment, examination of patient, obtaining history from patient or surrogate, ordering and performing treatments and interventions, ordering and review of laboratory studies and re-evaluation of patient's condition.  I assumed direction of critical care for this patient from another provider in my specialty: no    This document was prepared using Dragon voice recognition software and may include unintentional  dictation errors.    Ottie Glazier, M.D.  Division of [place]

## 2020-01-11 NOTE — Progress Notes (Signed)
PT Cancellation Note  Patient Details Name: [name] MRN: [id number] DOB: 1941/03/22   Cancelled Treatment:     Per PT protocol, will hold PT this morning. Pt has potassium of 5.9. Rn in room and reports pt was oriented this morning. Currently asleep in bed. Acute PT will continue to follow and progress as able when pt is more appropriate to participate.     Willette Pa 01/11/2020, 9:02 AM

## 2020-01-11 NOTE — Progress Notes (Signed)
PT Cancellation Note  Patient Details Name: [name] MRN: [id number] DOB: June 25, 1940   Cancelled Treatment:     Per PT protocol, PT will sign off 2/2 to being transferred to ICU. Please re-order once pt is appropriate.    Willette Pa 01/11/2020, 4:43 PM

## 2020-01-11 NOTE — Consult Note (Signed)
Cardiology Consultation:   Patient ID: [name]; [id number]; [birth year]   Admit date: 01/18/2020 Date of Consult: 01/11/2020  Primary Care Provider: Olin Hauser, DO Primary Cardiologist: [place] Primary Electrophysiologist:  [place]   Patient Profile:   [name] is a 79 y.o. 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 [name].  History of Present Illness:   [name] was previously evaluated by Dr. [name] in 2014 for preoperative evaluation for question of abnormal EKG prior to cataract surgery with EKG subsequently noted to not be abnormal.  The patient has a history of CABG x 7 in 2005 by Dr. [name].  He is followed by the [facility].  He underwent PPM implantation earlier this year for presumed symptomatic bradycardia with further details being unclear.  No records are available for review.  He lives alone.  He was admitted to [facility] on 01/01/2020 from his podiatrist's office with left foot ulceration. He underwent lower extremity angiogram with revascularization on 7/12. Despite antibiotics and revascularization, his wound did not seem to improve, he therefore underwent left BKA on 01/19/2020. His admission has been complicated by AKI, hyperkalemia, AMS, and suspected UTI. With worsening renal function and lower extremity swelling, he underwent echo on 01/10/2020, that showed an EF of 30-35%, global HK, normal LV diastolic function, mildly enlarged RV cavity size, moderately elevated PASP at 47.9 mmHg, mildly dilated left atrium, mild MR, mild to moderate TR, trivial AI. On our review, EF appears to be about 20-25% with severe TR in a paced rhythm. He has been started on IV Lasix with documented UOP of 2.8 L for the  admission to date. He has had worsening renal function with this. Renal ultrasound showed no acute findings. He is now noting dry heaves. He continues to feel like his abdomen is swollen. Last BM 2 days prior. Abdominal plain film at that time showed no acute findings with a nonobstructive bowel gas pattern. Cardiology is consulted for the above echo. At time of cardiology consult, he complains of significant dry heaves throughout the day today along with a distended abdomen and lower extremity swelling. He denies significant dyspnea or chest pain. No recent weights for review.    Most recent labs showed: potassium 5.9, BUN 98, SCr 3.55, albumin 2.5, AST 158, ALT 99, WBC 14.4, HGB 8.3, PLT 550, magnesium 2.2    Past Medical History:  Diagnosis Date  . Carcinoma of skin   . Cauda equina compression ([place])   . Diabetic retinopathy ([place])   . GERD (gastroesophageal reflux disease)   . Hypertension     Past Surgical History:  Procedure Laterality Date  . AMPUTATION Left 01/14/2020   Procedure: LEFT BELOW KNEE AMPUTATION;  Surgeon: Algernon Huxley, MD;  Location: [facility] ORS;  Service: General;  Laterality: Left;  . BACK SURGERY  07/2012  . CARDIAC CATHETERIZATION  2005  . CATARACT EXTRACTION  2014   right eye   . CORONARY ARTERY BYPASS GRAFT  2005    CABG x 7 at [place]     left knee  . LOWER EXTREMITY ANGIOGRAPHY Left 12/20/2019   Procedure: Lower Extremity Angiography;  Surgeon: Algernon Huxley,  MD;  Location: [place] [facility];  Service: Cardiovascular;  Laterality: Left;     Home Meds: Prior to Admission medications   Medication Sig Start Date End Date Taking? Authorizing Provider  amLODipine (NORVASC) 10 MG tablet Take 1 tablet (10 mg total) by mouth daily. 05/25/16  Yes [name], MD  aspirin EC 81 MG tablet Take 81 mg by mouth daily.   Yes [provider]  atorvastatin (LIPITOR) 80 MG tablet Take 40 mg by mouth at bedtime.   Yes [provider]  calcium acetate (PHOSLO) 667 MG capsule Take 667 mg by mouth 2 (two) times daily with a meal.   Yes [provider]  glipiZIDE (GLUCOTROL) 5 MG tablet Take 2.5 mg by mouth 2 (two) times daily before a meal.   Yes [provider]  Insulin Glargine (LANTUS) 100 UNIT/ML Solostar Pen Inject 18 Units into the skin daily.    Yes [provider]  omeprazole (PRILOSEC) 20 MG capsule Take 1 capsule (20 mg total) by mouth daily. 04/12/16  Yes [name], MD  tamsulosin (FLOMAX) 0.4 MG CAPS capsule Take 1 capsule (0.4 mg total) by mouth daily. 08/01/15  Yes [name], MD  acetaminophen (TYLENOL) 500 MG tablet Take 1,000 mg by mouth every 6 (six) hours as needed for mild pain, fever or headache.    [provider]  fluticasone (FLONASE) 50 MCG/ACT nasal spray Place 2 sprays into both nostrils daily. Use for 4-6 weeks then stop and use seasonally or as needed. 02/13/18   Karamalegos, [name], DO  loratadine (CLARITIN) 10 MG tablet Take 1 tablet (10 mg total) by mouth daily. Use for 4-6 weeks then stop, and use as needed or seasonally 02/13/18   Olin Hauser, DO    Inpatient Medications: Scheduled Meds: . aspirin EC  81 mg Oral Daily  . Chlorhexidine Gluconate Cloth  6 each Topical Daily  . docusate sodium  100 mg Oral BID  . feeding supplement (NEPRO CARB STEADY)  237 mL Oral TID BM  . ferrous sulfate  325 mg Oral Q breakfast  . fluticasone  2 spray Each Nare Daily  . furosemide  40 mg Intravenous Q12H  . gabapentin  100 mg Oral QHS  . heparin  5,000 Units Subcutaneous Q8H  . insulin aspart  0-15 Units Subcutaneous TID WC  . lidocaine  1 patch Transdermal Q24H  . loratadine  10 mg Oral Daily  . multivitamin with minerals  1 tablet Oral Daily  . pantoprazole  40 mg Oral Daily  . patiromer  16.8 g Oral Daily  . polyethylene glycol  17 g Oral Daily  . simethicone  80 mg Oral QID  . simvastatin  10 mg Oral QHS  . sodium  chloride flush  3 mL Intravenous Once  . tamsulosin  0.4 mg Oral Daily  . vitamin B-12  1,000 mcg Oral Daily   Continuous Infusions: . sodium chloride 250 mL (01/05/20 0616)  . albumin human     PRN Meds: sodium chloride, acetaminophen **OR** acetaminophen, alum & mag hydroxide-simeth, calcium carbonate, guaiFENesin-dextromethorphan, ondansetron **OR** ondansetron (ZOFRAN) IV, oxyCODONE, polyvinyl alcohol, promethazine, sodium phosphate  Allergies:   Allergies  Allergen Reactions  . Ivp Dye [Iodinated Diagnostic Agents] Other (See Comments)    Reaction:  Unknown   . Other     Pain meds by mouth FA dye Unknown reaction  . Codeine Nausea And Vomiting and Rash    Social History:  Social History   Socioeconomic History  . Marital status: Divorced    Spouse name: Not on file  . Number of children: Not on file  . Years of education: Not on file  . Highest education level: Not on file  Occupational History  . Not on file  Tobacco Use  . Smoking status: Former Smoker    Years: 45.00    Types: Pipe    Quit date: 2016    Years since quitting: 5.5  . Smokeless tobacco: Former Network engineer and Sexual Activity  . Alcohol use: Yes    Alcohol/week: 0.0 standard drinks    Comment: occasional.  . Drug use: No  . Sexual activity: Not on file  Other Topics Concern  . Not on file  Social History Narrative  . Not on file   Social Determinants of Health   Financial Resource Strain:   . Difficulty of Paying Living Expenses:   Food Insecurity:   . Worried About Charity fundraiser in the Last Year:   . Arboriculturist in the Last Year:   Transportation Needs:   . Film/video editor (Medical):   Marland Kitchen Lack of Transportation (Non-Medical):   Physical Activity:   . Days of Exercise per Week:   . Minutes of Exercise per Session:   Stress:   . Feeling of Stress :   Social Connections:   . Frequency of Communication with Friends and Family:   . Frequency of Social  Gatherings with Friends and Family:   . Attends Religious Services:   . Active Member of Clubs or Organizations:   . Attends Archivist Meetings:   Marland Kitchen Marital Status:   Intimate Partner Violence:   . Fear of Current or Ex-Partner:   . Emotionally Abused:   Marland Kitchen Physically Abused:   . Sexually Abused:      Family History:   Family History  Problem Relation Age of Onset  . Hypertension Sister   . Hyperlipidemia Sister     ROS:  Review of Systems  Constitutional: Positive for malaise/fatigue. Negative for chills, diaphoresis, fever and weight loss.  HENT: Negative for congestion.   Eyes: Negative for discharge and redness.  Respiratory: Positive for shortness of breath. Negative for cough, hemoptysis, sputum production and wheezing.   Cardiovascular: Positive for leg swelling. Negative for chest pain, palpitations, orthopnea, claudication and PND.  Gastrointestinal: Positive for constipation, nausea and vomiting. Negative for abdominal pain, blood in stool, heartburn and melena.       Abdominal distension   Genitourinary: Negative for hematuria.  Musculoskeletal: Negative for falls and myalgias.  Skin: Negative for rash.  Neurological: Positive for weakness. Negative for dizziness, tingling, tremors, sensory change, speech change, focal weakness and loss of consciousness.  Endo/Heme/Allergies: Does not bruise/bleed easily.  Psychiatric/Behavioral: Negative for substance abuse. The patient is not nervous/anxious.   All other systems reviewed and are negative.     Physical Exam/Data:   Vitals:   01/09/20 2331 01/10/20 1505 01/11/20 0003 01/11/20 0800  BP: 108/83 108/79 106/66 (!) 119/64  Pulse: 89 88 89 88  Resp: 17 14 19 14   Temp: 97.7 F (36.5 C) 97.6 F (36.4 C) 97.9 F (36.6 C) 98 F (36.7 C)  TempSrc: Oral Oral Oral Oral  SpO2: 99% 100% 95% 97%  Weight:      Height:        Intake/Output Summary (Last 24 hours) at 01/11/2020 1154 Last data filed at  01/11/2020 1007 Gross per  24 hour  Intake 120 ml  Output 380 ml  Net -260 ml   Filed Weights   12/25/2019 1533  Weight: 75.8 kg   Body mass index is 21.44 kg/m.   Physical Exam: General: Well developed, well nourished, ill-appearing. Head: Normocephalic, atraumatic, sclera non-icteric, no xanthomas, nares without discharge.  Neck: Negative for carotid bruits. JVD elevated to the angle of the mandible. Lungs: Diminished breath sounds bilaterally. Breathing is unlabored. Heart: RRR with S1 S2. III/VI systolic murmur, no rubs, or gallops appreciated. Abdomen: Soft, non-tender, distended with normoactive bowel sounds. No hepatomegaly. No rebound/guarding. No obvious abdominal masses. Msk:  Strength and tone appear normal for age. Extremities: No clubbing or cyanosis.  Left BKA noted with dressing in place with 1-2+ bilateral lower extremity pitting edema to the thighs. Distal pedal pulses are 2+ and equal bilaterally. Neuro: Somnolent though awakes easily to questions. No facial asymmetry. No focal deficit. Moves all extremities spontaneously. Psych:  Responds to questions appropriately with a normal affect.   EKG:  The EKG was personally reviewed and demonstrates: V paced, 106 bpm Telemetry:  Telemetry was personally reviewed and demonstrates: Not on telemetry  Weights: Filed Weights   12/20/2019 1533  Weight: 75.8 kg    Relevant CV Studies:  2D echo 01/10/2020: 1. Left ventricular ejection fraction, by estimation, is 30 to 35%. The  left ventricle has moderately decreased function. The left ventricle  demonstrates global hypokinesis. The left ventricular internal cavity size  was mildly dilated. Left ventricular  diastolic parameters were normal.  2. Right ventricular systolic function was not well visualized. The right  ventricular size is mildly enlarged. There is moderately elevated  pulmonary artery systolic pressure.  3. Left atrial size was mildly dilated.  4. The  mitral valve is grossly normal. Mild mitral valve regurgitation.  5. Tricuspid valve regurgitation is mild to moderate.  6. The aortic valve is grossly normal. Aortic valve regurgitation is  trivial.   Laboratory Data:  Chemistry Recent Labs  Lab 01/10/20 0526 01/10/20 2146 01/11/20 0443  NA 128* 125* 127*  K 5.4* 5.4* 5.9*  CL 98 94* 92*  CO2 16* 20* 20*  GLUCOSE 223* 239* 296*  BUN 92* 98* 98*  CREATININE 3.07* 3.43* 3.55*  CALCIUM 7.6* 7.8* 8.0*  GFRNONAA 18* 16* 15*  GFRAA 21* 18* 18*  ANIONGAP 14 11 15     Recent Labs  Lab 01/11/20 0443  PROT 6.2*  ALBUMIN 2.5*  AST 158*  ALT 99*  ALKPHOS 239*  BILITOT 1.3*   Hematology Recent Labs  Lab 01/07/20 0309 01/09/20 0446 01/11/20 0443  WBC 15.0* 12.8* 14.4*  RBC 3.02* 3.16* 3.14*  HGB 8.1* 8.5* 8.3*  HCT 23.9* 25.7* 26.7*  MCV 79.1* 81.3 85.0  MCH 26.8 26.9 26.4  MCHC 33.9 33.1 31.1  RDW 16.6* 18.2* 20.5*  PLT 550* 570* 550*   Cardiac EnzymesNo results for input(s): TROPONINI in the last 168 hours. No results for input(s): TROPIPOC in the last 168 hours.  BNPNo results for input(s): BNP, PROBNP in the last 168 hours.  DDimer No results for input(s): DDIMER in the last 168 hours.  Radiology/Studies:  DG Abd 1 View  Result Date: 01/09/2020 IMPRESSION: Increased gaseous distention of stomach. Nonobstructive bowel gas pattern. Aortic Atherosclerosis (ICD10-I70.0). Electronically Signed   By: Lavonia Dana M.D.   On: 01/09/2020 10:25   US RENAL  Result Date: 01/10/2020 IMPRESSION: No acute finding sonographically in the bilateral kidneys. Electronically Signed   By: Audie Pinto  M.D.   On: 01/10/2020 12:46    Assessment and Plan:   1.  HFrEF/cardiorenal syndrome: -Chronicity uncertain -Presumably ischemic in etiology given the patient's prior history of cardiac bypass surgery -Echo this admission shows an EF of 20 to 25% by [facility] read with severe TR and elevated right heart pressures -He is  significantly volume overloaded -Patient appears to have cardiorenal syndrome -Currently on IV Lasix 40 mg twice daily with documented urine output of 2.8 L for the admission -Recommend patient be transferred and a minimum to progressive care unit, if not ICU with a low threshold to initiate milrinone for augmentation of diuresis -Following diuresis, he will need R/LHC for further evaluation of his cardiomyopathy prior to discharge -Unable to escalate GDMT including beta-blocker, ACE inhibitor/ARB/Entresto, and MRA at this time in the setting of relative hypotension, AKI, and hyperkalemia -Escalate evidence-based therapy as/if able -Cannot exclude some degree of swelling occurring in the setting of third spacing with hypoalbuminemia -Daily weights -Strict I's and O's  2.  CAD status post CABG: -He denies any symptoms of angina -ASA -PTA statin -He will need cardiac cath prior to discharge as outlined above -Attempt to obtain records from the [facility], though historically this has been unsuccessful   3.  Presumed symptomatic bradycardia: -Notes indicate patient is status post PPM earlier this year with further details being unclear -Device appears to be functioning normally -Recommend transferring patient to progressive care unit and placing on telemetry  4.  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  Acute on CKD stage III with hyperkalemia: -Likely exacerbated by cardiorenal syndrome -Nephrology on board -May need milrinone as outlined above  6.  HTN: -BP soft -Agree with holding PTA amlodipine in the setting of relative hypotension and cardiomyopathy  7.  HLD: -PTA statin  Overall, patient is critically ill. Recommend palliative care consult to discuss [place].     For questions or updates, please contact Magen Suriano Please consult [email or web address] for contact info under  Cardiology/STEMI.   Signed, Christell Faith, PA-C [place] Pager: [phone] 01/11/2020, 11:54 AM

## 2020-01-11 NOTE — Progress Notes (Signed)
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 Patient was started on IV vancomycin, Rocephin and Flagyl, podiatry and vascular surgeon were consulted. Patient had a angiogram and revascularization on 7/12. But the foot gangrene does not seem to be improving, as a result, planning for below-knee amputation.  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 7/16.Antibiotic changed to Unasyn in addition to vancomycin. 7/17.Developed hyperkalemia, most likely due to surgery. Given Kayexalate, sodium bicarb, calcium gluconate and additional sodium bicarb drip. Increased leukocytosis, probably from trauma. 7/18.Condition improved. Leukocytosis better. Procalcitonin level trending down. Renal functionbetter. Potassium normalized. Completed antibiotics. 7/20.  Patient has altered mental status today.  Sleepy and confused.  Suspect UTI secondary to suprapubic catheter.  Obtain stat UA and urine culture.  Also started meropenem for now until culture results available.    Consultants:  Vascular, wound nursing Procedures:   Antimicrobials:      Subjective: Not keeping anything down. Nauseous and vomiting. No abd pain. This am hes mildly lethargic.   Objective: Vitals:   01/09/20 2331 01/10/20 1505 01/11/20 0003 01/11/20 0800  BP: 108/83 108/79 106/66 (!) 119/64  Pulse: 89 88 89 88  Resp: 17 14 19 14   Temp: 97.7 F (36.5 C) 97.6 F (36.4 C) 97.9 F (36.6 C) 98 F (36.7 C)  TempSrc: Oral Oral Oral Oral    SpO2: 99% 100% 95% 97%  Weight:      Height:        Intake/Output Summary (Last 24 hours) at 01/11/2020 1518 Last data filed at 01/11/2020 1429 Gross per 24 hour  Intake 120 ml  Output 455 ml  Net -335 ml   Filed Weights   01/10/2020 1533  Weight: 75.8 kg    Examination:  General exam: mildly lethargic, answers questions but eyes closed Respiratory system: decrease bs at bases, poor respiratory effort Cardiovascular system: Regular , s1/s2 +jvd GU: mild decrease scrotal edema  Gastrointestinal system:hyperactive bs, soft distended, NT Central nervous system:unable to examine Extremities:+ +Right lower extremity edema. Lt BKA Skin: Warm dry Psychiatry: Unable to assess    Data Reviewed: I have personally reviewed following labs and imaging studies  CBC: Recent Labs  Lab 01/05/20 0444 01/06/20 0513 01/07/20 0309 01/09/20 0446 01/11/20 0443  WBC 22.4* 14.7* 15.0* 12.8* 14.4*  NEUTROABS 16.9* 10.9* 10.9* 9.9*  --   HGB 8.7* 8.2* 8.1* 8.5* 8.3*  HCT 26.4* 23.9* 23.9* 25.7* 26.7*  MCV 78.3* 78.9* 79.1* 81.3 85.0  PLT 542* 497* 550* 570* 884*   Basic Metabolic Panel: Recent Labs  Lab 01/05/20 0444 01/05/20 1257 01/06/20 0513 01/06/20 0513 01/07/20 0309 01/09/20 0446 01/10/20 0526 01/10/20 2146 01/11/20 0443  NA 131*  --  132*   < > 131* 127* 128* 125* 127*  K 5.8*   < > 4.3   < > 3.8 4.9 5.4* 5.4* 5.9*  CL 102  --  102   < > 102 95* 98 94* 92*  CO2 20*  --  22   < > 23 19* 16* 20* 20*  GLUCOSE 153*  --  195*   < > 60* 195* 223* 239* 296*  BUN 78*  --  74*   < > 78* 93* 92* 98* 98*  CREATININE 2.27*  --  2.11*   < > 1.93* 2.81* 3.07* 3.43* 3.55*  CALCIUM 8.1*  --  7.5*   < > 7.6* 7.7* 7.6* 7.8* 8.0*  MG 2.1  --  2.0  --  2.0 2.1  --  2.2  --    < > = values in this interval not displayed.   GFR: Estimated Creatinine Clearance: 17.5 mL/min (A) (by C-G formula based on SCr of 3.55 mg/dL (H)). Liver Function Tests: Recent Labs  Lab 01/11/20 0443  AST  158*  ALT 99*  ALKPHOS 239*  BILITOT 1.3*  PROT 6.2*  ALBUMIN 2.5*   No results for input(s): LIPASE, AMYLASE in the last 168 hours. No results for input(s): AMMONIA in the last 168 hours. Coagulation Profile: No results for input(s): INR, PROTIME in the last 168 hours. Cardiac Enzymes: No results for input(s): CKTOTAL, CKMB, CKMBINDEX, TROPONINI in the last 168 hours. BNP (last 3 results) No results for input(s): PROBNP in the last 8760 hours. HbA1C: No results for input(s): HGBA1C in the last 72 hours. CBG: Recent Labs  Lab 01/10/20 1136 01/10/20 1633 01/10/20 2101 01/11/20 0759 01/11/20 1236  GLUCAP 177* 213* 210* 283* 226*   Lipid Profile: No results for input(s): CHOL, HDL, LDLCALC, TRIG, CHOLHDL, LDLDIRECT in the last 72 hours. Thyroid Function Tests: No results for input(s): TSH, T4TOTAL, FREET4, T3FREE, THYROIDAB in the last 72 hours. Anemia Panel: No results for input(s): VITAMINB12, FOLATE, FERRITIN, TIBC, IRON, RETICCTPCT in the last 72 hours. Sepsis Labs: Recent Labs  Lab 01/05/20 0444 01/06/20 0513 01/08/20 0854  PROCALCITON 0.48 0.37 0.33    Recent Results (from the past 240 hour(s))  Urine Culture     Status: Abnormal   Collection Time: 01/08/20 10:45 AM   Specimen: Urine, Random  Result Value Ref Range Status   Specimen Description   Final    URINE, RANDOM Performed at [facility], [address] [place], [place] [address]    Special Requests   Final    NONE Performed at [facility], [address] [place], [place] [address]    Culture >=100,000 COLONIES/mL YEAST (A)  Final   Report Status 01/09/2020 FINAL  Final         Radiology Studies: US RENAL  Result Date: 01/10/2020 CLINICAL DATA:  Acute kidney failure. EXAM: RENAL / URINARY TRACT ULTRASOUND COMPLETE COMPARISON:  Renal ultrasound 07/30/2015 FINDINGS: Right Kidney: Renal measurements: 12.1 x 5.9 x 6.3 cm = volume: 238 mL . Echogenicity within normal  limits. There is a cyst in the superior pole measuring 1.1 x 1.0 x 1.0 cm. There is another cyst in the inferior pole measuring 2.3 x 2.1 x 2.4 cm. Left Kidney: Renal measurements: 12.1 x 5.6 x 6.5 cm = volume: 230 mL. Echogenicity within normal limits. No mass or hydronephrosis visualized. Bladder: Not visualized.  Patient has suprapubic catheter. Other: None. IMPRESSION: No acute finding sonographically in the bilateral kidneys. Electronically Signed   By: Audie Pinto M.D.   On: 01/10/2020 12:46   ECHOCARDIOGRAM COMPLETE  Result Date: 01/11/2020    ECHOCARDIOGRAM REPORT   Patient Name:   JARYAN CHICOINE Date of Exam: 01/10/2020 Medical Rec #:  [id number]      Height:       74.0 in Accession #:    [id number]     Weight:       167.0 lb  Date of Birth:  1940/08/02      BSA:          2.013 m Patient Age:    49 years       BP:           108/79 mmHg Patient Gender: M              HR:           88 bpm. Exam Location:  [facility] Procedure: 2D Echo, Cardiac Doppler and Color Doppler Indications:     CHF-Acute Systolic 403.47 / Q25.95  History:         Patient has no prior history of Echocardiogram examinations.                  Risk Factors:Hypertension.  Sonographer:     Alyse Low Roar Referring Phys:  [id number] [facility] Diagnosing Phys: Bartholome Bill MD IMPRESSIONS  1. Left ventricular ejection fraction, by estimation, is 30 to 35%. The left ventricle has moderately decreased function. The left ventricle demonstrates global hypokinesis. The left ventricular internal cavity size was mildly dilated. Left ventricular diastolic parameters were normal.  2. Right ventricular systolic function was not well visualized. The right ventricular size is mildly enlarged. There is moderately elevated pulmonary artery systolic pressure.  3. Left atrial size was mildly dilated.  4. The mitral valve is grossly normal. Mild mitral valve regurgitation.  5. Tricuspid valve regurgitation is mild to moderate.  6. The aortic valve is grossly  normal. Aortic valve regurgitation is trivial. FINDINGS  Left Ventricle: Left ventricular ejection fraction, by estimation, is 30 to 35%. The left ventricle has moderately decreased function. The left ventricle demonstrates global hypokinesis. The left ventricular internal cavity size was mildly dilated. There is no left ventricular hypertrophy. Left ventricular diastolic parameters were normal. Right Ventricle: The right ventricular size is mildly enlarged. No increase in right ventricular wall thickness. Right ventricular systolic function was not well visualized. There is moderately elevated pulmonary artery systolic pressure. The tricuspid regurgitant velocity is 3.08 m/s, and with an assumed right atrial pressure of 10 mmHg, the estimated right ventricular systolic pressure is 43.3 mmHg. Left Atrium: Left atrial size was mildly dilated. Right Atrium: Right atrial size was not well visualized. Pericardium: There is no evidence of pericardial effusion. Mitral Valve: The mitral valve is grossly normal. Mild mitral valve regurgitation. Tricuspid Valve: The tricuspid valve is not well visualized. Tricuspid valve regurgitation is mild to moderate. Aortic Valve: The aortic valve is grossly normal. Aortic valve regurgitation is trivial. Aortic valve mean gradient measures 2.0 mmHg. Aortic valve peak gradient measures 3.1 mmHg. Aortic valve area, by VTI measures 1.94 cm. Pulmonic Valve: The pulmonic valve was not well visualized. Pulmonic valve regurgitation is trivial. Aorta: The aortic root is normal in size and structure. IAS/Shunts: The interatrial septum was not assessed.  LEFT VENTRICLE PLAX 2D LVIDd:         5.49 cm  Diastology LVIDs:         4.80 cm  LV e' lateral:   6.20 cm/s LV PW:         1.27 cm  LV E/e' lateral: 23.5 LV IVS:        1.20 cm  LV e' medial:    5.11 cm/s LVOT diam:     2.00 cm  LV E/e' medial:  28.6 LV SV:         28 LV SV Index:   14 LVOT Area:  3.14 cm  RIGHT VENTRICLE RV S prime:      5.87 cm/s LEFT ATRIUM             Index       RIGHT ATRIUM           Index LA diam:        4.70 cm 2.33 cm/m  RA Area:     25.90 cm LA Vol (A2C):   80.3 ml 39.89 ml/m RA Volume:   90.50 ml  44.95 ml/m LA Vol (A4C):   84.6 ml 42.02 ml/m LA Biplane Vol: 85.4 ml 42.42 ml/m  AORTIC VALVE                   PULMONIC VALVE AV Area (Vmax):    2.01 cm    PV Vmax:        0.65 m/s AV Area (Vmean):   1.67 cm    PV Peak grad:   1.7 mmHg AV Area (VTI):     1.94 cm    RVOT Peak grad: 1 mmHg AV Vmax:           87.50 cm/s AV Vmean:          65.600 cm/s AV VTI:            0.142 m AV Peak Grad:      3.1 mmHg AV Mean Grad:      2.0 mmHg LVOT Vmax:         55.90 cm/s LVOT Vmean:        34.900 cm/s LVOT VTI:          0.088 m LVOT/AV VTI ratio: 0.62  AORTA Ao Root diam: 3.00 cm MITRAL VALVE                TRICUSPID VALVE MV Area (PHT): 5.31 cm     TR Peak grad:   37.9 mmHg MV Decel Time: 143 msec     TR Vmax:        308.00 cm/s MV E velocity: 146.00 cm/s                             SHUNTS                             Systemic VTI:  0.09 m                             Systemic Diam: 2.00 cm Bartholome Bill MD Electronically signed by Bartholome Bill MD Signature Date/Time: 01/11/2020/8:57:24 AM    Final         Scheduled Meds: . aspirin EC  81 mg Oral Daily  . Chlorhexidine Gluconate Cloth  6 each Topical Daily  . docusate sodium  100 mg Oral BID  . feeding supplement (NEPRO CARB STEADY)  237 mL Oral TID BM  . ferrous sulfate  325 mg Oral Q breakfast  . fluticasone  2 spray Each Nare Daily  . furosemide  40 mg Intravenous Q12H  . gabapentin  100 mg Oral QHS  . heparin  5,000 Units Subcutaneous Q8H  . insulin aspart  0-15 Units Subcutaneous TID WC  . lidocaine  1 patch Transdermal Q24H  . loratadine  10 mg Oral Daily  . multivitamin with minerals  1 tablet Oral Daily  . pantoprazole  40 mg Oral  Daily  . patiromer  16.8 g Oral Daily  . polyethylene glycol  17 g Oral Daily  . simethicone  80 mg Oral QID  .  simvastatin  10 mg Oral QHS  . sodium chloride flush  3 mL Intravenous Once  . tamsulosin  0.4 mg Oral Daily  . vitamin B-12  1,000 mcg Oral Daily   Continuous Infusions: . sodium chloride 250 mL (01/05/20 0616)  . albumin human 25 g (01/11/20 1445)  . 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  Acute metabolic encephalopathy. 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  He also had some hypoglycemia , MS improved.  Today he is mildly lethargic. Standing  lantus was discontinued due to hypoglycemia ucx +yeast, but no need to treat  Continue to monitor  2.  Uncontrolled type 2 diabetes with hypoglycemia. Patient had episodes hypoglycemia  Standing dose Lantus was discontinued until able to tolerate p.o. intake without vomiting Continue R ISS only     3.  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 Blood culture was negative.  Complete antibiotics. S/p BKA Daily dressing changes to stump vascluar following, healing well PT/OT -rec. SNF pending   4.  Vomiting- abd xr no obstruction. +gaseous.  Started on simethicone Continue with bowel regimen Hold off on ct as cr up. Consulted GI , Dr. Vicente Males to see in am  5.acute /Chronic kidney disease stage IIIb with hyperkalemia. Suprapubic cath working Possibly due to hypoperfusion from volume overload as on exam appears volume overloaded Echo reveiwed personally by Dr. Rockey Situ, EF 20 to 25% Likely  cardiorenal syndrome Will need inotropes with Lasix We will transfer to ICU as patient is full code at this point he may end up arresting, will transfer for higher level of care Nephrology following Cardiology following ICU attending [facility] was notified and has accepted the patient  6. Acute SHF/Cardiomyopathy Volume overloaded Cards consulted-input appreciated, needs ionotrope Continue Lasix Patient is not a good candidate for ischemic work-up given his comorbidities  7.  Essential hypertension. Will d/c norvasc since bp on low side Please see above, will need milrinone and Lasix for acute systolic heart failure Will eventually need heart failure medications  7.  Reactive thrombocytosis. Continue to follow.  8. Hyponatremia- likely 2/2 vol overload Improved mildly with diuresis, Sodium 127 today Continue to monitor  9.Hyperkalemia- K is 5.9 On lasix still rising On veltassa Monitor closely   DVT prophylaxis: Heparin Code Status: Full Family Communication: None Disposition Plan: SNF pending Status is: Inpatient  Remains inpatient appropriate because:IV treatments appropriate due to intensity of illness or inability to take PO   Dispo: The patient is from: Home              Anticipated d/c is to: SNF              Anticipated d/c date is: 3 days  Patient currently is not medically stable to d/c.Pt requiring iv diuresis,not tolerating po intake, transferring to icu for ionotropes            LOS: 16 days   Time spent: 45 min with >50% on coc    Nolberto Hanlon, MD [facility] Pager [phone]  If 7PM-7AM, please contact night-coverage [email or web address] Password [facility] 01/11/2020, 3:18 PM

## 2020-01-11 NOTE — Progress Notes (Signed)
Pt with constant emesis, brown, thin, patient oral meds deferred as patient unable to keep even water down. Dr. [name] made aware. Enema administered as ordered, albumin infusing as ordered. Pt pending transfer to ICU

## 2020-01-11 NOTE — Progress Notes (Signed)
Inpatient Diabetes Program Recommendations  AACE/ADA: New Consensus Statement on Inpatient Glycemic Control   Target Ranges:  Prepandial:   less than 140 mg/dL      Peak postprandial:   less than 180 mg/dL (1-2 hours)      Critically ill patients:  140 - 180 mg/dL   Results for SAVAUGHN, KARWOWSKI (MRN [id number]) as of 01/11/2020 12:07  Ref. Range 01/10/2020 07:42 01/10/2020 11:36 01/10/2020 16:33 01/10/2020 21:01 01/11/2020 07:59  Glucose-Capillary Latest Ref Range: 70 - 99 mg/dL 209 (H) 177 (H) 213 (H) 210 (H) 283 (H)   Review of Glycemic Control  Diabetes history: DM2 Outpatient Diabetes medications: Lantus 18 units daily Current orders for Inpatient glycemic control: Novolog 0-15 units TID with meals  Inpatient Diabetes Program Recommendations:    Insulin-Basal: Noted prior hypoglycemia with basal insulin. Glucose 177-213 mg/dl on 01/10/20 and fasting glucose 283 mg/dl today. Please consider ordering low dose Lantus 5 units Q24H and adjust if needed based on glucose trends.  Thanks, Barnie Alderman, RN, MSN, CDE Diabetes Coordinator Inpatient Diabetes Program [phone] (Team Pager from 8am to 5pm)

## 2020-01-12 DIAGNOSIS — R112 Nausea with vomiting, unspecified: Secondary | ICD-10-CM | POA: Diagnosis not present

## 2020-01-12 DIAGNOSIS — I5021 Acute systolic (congestive) heart failure: Secondary | ICD-10-CM

## 2020-01-12 DIAGNOSIS — I255 Ischemic cardiomyopathy: Secondary | ICD-10-CM

## 2020-01-12 DIAGNOSIS — I2510 Atherosclerotic heart disease of native coronary artery without angina pectoris: Secondary | ICD-10-CM

## 2020-01-12 LAB — CBC
HCT: 24.8 % — ABNORMAL LOW (ref 39.0–52.0)
Hemoglobin: 8.1 g/dL — ABNORMAL LOW (ref 13.0–17.0)
MCH: 27.3 pg (ref 26.0–34.0)
MCHC: 32.7 g/dL (ref 30.0–36.0)
MCV: 83.5 fL (ref 80.0–100.0)
Platelets: 440 10*3/uL — ABNORMAL HIGH (ref 150–400)
RBC: 2.97 MIL/uL — ABNORMAL LOW (ref 4.22–5.81)
RDW: 21.6 % — ABNORMAL HIGH (ref 11.5–15.5)
WBC: 13.1 10*3/uL — ABNORMAL HIGH (ref 4.0–10.5)
nRBC: 0.2 % (ref 0.0–0.2)

## 2020-01-12 LAB — GLUCOSE, CAPILLARY
Glucose-Capillary: 161 mg/dL — ABNORMAL HIGH (ref 70–99)
Glucose-Capillary: 130 mg/dL — ABNORMAL HIGH (ref 70–99)
Glucose-Capillary: 142 mg/dL — ABNORMAL HIGH (ref 70–99)
Glucose-Capillary: 133 mg/dL — ABNORMAL HIGH (ref 70–99)

## 2020-01-12 LAB — BASIC METABOLIC PANEL
Anion gap: 16 — ABNORMAL HIGH (ref 5–15)
BUN: 99 mg/dL — ABNORMAL HIGH (ref 8–23)
CO2: 18 mmol/L — ABNORMAL LOW (ref 22–32)
Calcium: 8.2 mg/dL — ABNORMAL LOW (ref 8.9–10.3)
Chloride: 94 mmol/L — ABNORMAL LOW (ref 98–111)
Creatinine, Ser: 3.83 mg/dL — ABNORMAL HIGH (ref 0.61–1.24)
GFR calc Af Amer: 16 mL/min — ABNORMAL LOW (ref >60)
GFR calc non Af Amer: 14 mL/min — ABNORMAL LOW (ref >60)
Glucose, Bld: 159 mg/dL — ABNORMAL HIGH (ref 70–99)
Potassium: 5 mmol/L (ref 3.5–5.1)
Sodium: 128 mmol/L — ABNORMAL LOW (ref 135–145)

## 2020-01-12 MED ORDER — PANTOPRAZOLE SODIUM 40 MG IV SOLR
40.0000 mg | Freq: Two times a day (BID) | INTRAVENOUS | Status: DC
  Administered 2020-01-12 – 2020-01-17 (×11): 40 mg via INTRAVENOUS
  Filled 2020-01-12 (×11): qty 40

## 2020-01-12 MED ORDER — SCOPOLAMINE 1 MG/3DAYS TD PT72
1.0000 | MEDICATED_PATCH | TRANSDERMAL | Status: DC
  Administered 2020-01-12 – 2020-01-15 (×2): 1.5 mg via TRANSDERMAL
  Filled 2020-01-12 (×2): qty 1

## 2020-01-12 MED ORDER — FUROSEMIDE 10 MG/ML IJ SOLN
8.0000 mg/h | INTRAVENOUS | Status: DC
  Administered 2020-01-12: 8 mg/h via INTRAVENOUS
  Filled 2020-01-12: qty 25

## 2020-01-12 MED ORDER — ALUM & MAG HYDROXIDE-SIMETH 200-200-20 MG/5ML PO SUSP
30.0000 mL | Freq: Once | ORAL | Status: AC
  Administered 2020-01-12: 30 mL via ORAL
  Filled 2020-01-12: qty 30

## 2020-01-12 MED ORDER — LIDOCAINE VISCOUS HCL 2 % MT SOLN
15.0000 mL | Freq: Once | OROMUCOSAL | Status: AC
  Administered 2020-01-12: 15 mL via ORAL
  Filled 2020-01-12: qty 15

## 2020-01-12 MED ORDER — METOCLOPRAMIDE HCL 5 MG/ML IJ SOLN
10.0000 mg | Freq: Four times a day (QID) | INTRAMUSCULAR | Status: DC
  Administered 2020-01-12 – 2020-01-17 (×15): 10 mg via INTRAVENOUS
  Filled 2020-01-12 (×16): qty 2

## 2020-01-12 NOTE — Progress Notes (Addendum)
Progress Note  Patient Name: [name] Date of Encounter: 01/12/2020  Primary Cardiologist: Miller   Transferred to the ICU 7/23 and placed on milrinone. Minimal UOP over the past 24 hours with a net - 2 L for the admission. BP soft in the 36O systolic this morning. Renal function continues to decline. Potassium improved and high normal. Sodium improving. HGB low, though stable.   No chest pain, dyspnea, palpitations, or dizziness. Had a BM yesterday. NO further dry heaves.   Inpatient Medications    Scheduled Meds:  aspirin EC  81 mg Oral Daily   Chlorhexidine Gluconate Cloth  6 each Topical Daily   docusate sodium  100 mg Oral BID   feeding supplement (NEPRO CARB STEADY)  237 mL Oral TID BM   ferrous sulfate  325 mg Oral Q breakfast   fluticasone  2 spray Each Nare Daily   furosemide  40 mg Intravenous Q12H   gabapentin  100 mg Oral QHS   heparin  5,000 Units Subcutaneous Q8H   insulin aspart  0-15 Units Subcutaneous TID WC   insulin glargine  10 Units Subcutaneous QHS   lidocaine  1 patch Transdermal Q24H   loratadine  10 mg Oral Daily   multivitamin with minerals  1 tablet Oral Daily   pantoprazole  40 mg Oral Daily   patiromer  16.8 g Oral Daily   polyethylene glycol  17 g Oral Daily   simethicone  80 mg Oral QID   simvastatin  10 mg Oral QHS   sodium chloride flush  3 mL Intravenous Once   tamsulosin  0.4 mg Oral Daily   vitamin B-12  1,000 mcg Oral Daily   Continuous Infusions:  sodium chloride 250 mL (01/05/20 0616)   albumin human Stopped (01/12/20 0028)   milrinone 0.3 mcg/kg/min (01/12/20 0429)   PRN Meds: sodium chloride, acetaminophen **OR** acetaminophen, alum & mag hydroxide-simeth, calcium carbonate, guaiFENesin-dextromethorphan, ondansetron **OR** ondansetron (ZOFRAN) IV, oxyCODONE, polyvinyl alcohol, promethazine, sodium phosphate   Vital Signs    Vitals:   01/12/20 0300 01/12/20 0400 01/12/20 0500 01/12/20 0600  BP:   (!) 120/48 (!) 83/45 (!) 85/46  Pulse:  104 95 95  Resp:  (!) 28 (!) 28 20  Temp: 97.9 F (36.6 C)     TempSrc: Oral     SpO2:  (!) 79% 92% (!) 86%  Weight:      Height:        Intake/Output Summary (Last 24 hours) at 01/12/2020 0707 Last data filed at 01/12/2020 0300 Gross per 24 hour  Intake 301.09 ml  Output 285 ml  Net 16.09 ml   Filed Weights   01/14/2020 1533 01/11/20 1800  Weight: 75.8 kg (!) 95.9 kg    Telemetry    Paced rhythm - Personally Reviewed  ECG    No new tracings - Personally Reviewed  Physical Exam   GEN: No acute distress.   Neck: JVD elevated to the angle of the mandible. Cardiac: RRR, II/VI systolic murmur LSB, no rubs, or gallops.  Respiratory: Clear to auscultation bilaterally.  GI: Soft, nontender, distended.   MS: 2+ bilateral pitting edema; No deformity. Neuro:  Alert and oriented x 3; Nonfocal.  Psych: Normal affect.  Labs    Chemistry Recent Labs  Lab 01/10/20 2146 01/11/20 0443 01/12/20 0529  NA 125* 127* 128*  K 5.4* 5.9* 5.0  CL 94* 92* 94*  CO2 20* 20* 18*  GLUCOSE 239* 296* 159*  BUN 98* 98* 99*  CREATININE 3.43* 3.55* 3.83*  CALCIUM 7.8* 8.0* 8.2*  PROT  --  6.2*  --   ALBUMIN  --  2.5*  --   AST  --  158*  --   ALT  --  99*  --   ALKPHOS  --  239*  --   BILITOT  --  1.3*  --   GFRNONAA 16* 15* 14*  GFRAA 18* 18* 16*  ANIONGAP 11 15 16*     Hematology Recent Labs  Lab 01/09/20 0446 01/11/20 0443 01/12/20 0529  WBC 12.8* 14.4* 13.1*  RBC 3.16* 3.14* 2.97*  HGB 8.5* 8.3* 8.1*  HCT 25.7* 26.7* 24.8*  MCV 81.3 85.0 83.5  MCH 26.9 26.4 27.3  MCHC 33.1 31.1 32.7  RDW 18.2* 20.5* 21.6*  PLT 570* 550* 440*    Cardiac EnzymesNo results for input(s): TROPONINI in the last 168 hours. No results for input(s): TROPIPOC in the last 168 hours.   BNPNo results for input(s): BNP, PROBNP in the last 168 hours.   DDimer No results for input(s): DDIMER in the last 168 hours.   Radiology    US RENAL  Result  Date: 01/10/2020 IMPRESSION: No acute finding sonographically in the bilateral kidneys. Electronically Signed   By: Audie Pinto M.D.   On: 01/10/2020 12:46    Cardiac Studies   2D echo 01/10/2020: 1. Left ventricular ejection fraction, by estimation, is 30 to 35%. The  left ventricle has moderately decreased function. The left ventricle  demonstrates global hypokinesis. The left ventricular internal cavity size  was mildly dilated. Left ventricular  diastolic parameters were normal.   2. Right ventricular systolic function was not well visualized. The right  ventricular size is mildly enlarged. There is moderately elevated  pulmonary artery systolic pressure.   3. Left atrial size was mildly dilated.   4. The mitral valve is grossly normal. Mild mitral valve regurgitation.   5. Tricuspid valve regurgitation is mild to moderate.   6. The aortic valve is grossly normal. Aortic valve regurgitation is  trivial.   Patient Profile     79 y.o. male with history of CAD s/p CABG x 7 in 2005, reported symptomatic bradycardia s/p PPM earlier this year at the [place], CKD stage III, PAD, IDDM, anemia of chronic disease, HTN, HLD, BPH/urinary retention who is being seen today for the evaluation of cardiomyopathy.  Assessment & Plan    1. HFrEF/cardiorenal syndrome: -Chronicity uncertain -Presumably ischemic in etiology given the patient's prior history of cardiac bypass surgery -Echo this admission shows an EF of 20 to 25% by [facility] read with severe TR and elevated right heart pressures -He is significantly volume overloaded -Transferred to the ICU on 7/23 and placed on milrinone gtt to assist with augmentation of diuresis  -Continue milrinone, may need to decrease dose with hypotension, will discuss with MD -Following diuresis, he will need [facility] for further evaluation of his cardiomyopathy prior to discharge, pending GOC -Unable to escalate GDMT including beta-blocker, ACE  inhibitor/ARB/Entresto, and MRA at this time in the setting of relative hypotension, AKI, and hyperkalemia -Escalate evidence-based therapy as/if able -Cannot exclude some degree of swelling occurring in the setting of third spacing with hypoalbuminemia -Daily weights -Strict I's and O's   2.  CAD status post CABG: -He denies any symptoms of angina -ASA -PTA statin -He will need cardiac cath prior to discharge as outlined above -Attempt to obtain records from the [place], though historically this  has been unsuccessful    3.  Presumed symptomatic bradycardia: -Notes indicate patient is status post PPM earlier this year with further details being unclear -Device appears to be functioning normally -Recommend transferring patient to progressive care unit and placing on telemetry   4.  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  Acute on CKD stage III with hyperkalemia: -Likely ATN exacerbated by cardiorenal syndrome -Renal function continues to decline  -Nephrology on board -Milrinone as outlined above   6.  HTN: -BP soft -Agree with holding PTA amlodipine in the setting of relative hypotension and cardiomyopathy   7.  HLD: -PTA statin   Overall, patient is critically ill. Palliative care has been consulted to discuss [place].  For questions or updates, please contact [place] Please consult [email or web address] for contact info under Cardiology/STEMI.    Signed, Christell Faith, PA-C [facility] Pager: [phone] 01/12/2020, 7:07 AM  Minimal urine output Constant nausea and vomiting On milrinone but no central line to check CVP/[place].  Volume overload with ischemic DCM and decreased EF. GOC/palliative to see but nurse indicates patient wants active care. If this is so will likely need CVVH Plan per primary service  Jenkins Rouge MD [facility]

## 2020-01-12 NOTE — Progress Notes (Signed)
[facility]  ROUNDING NOTE   Subjective:   Patient resting comfortably in bed. Recorded urine output yesterday was 885. Patient on amiodarone drip. Creatinine currently 3.8.  Objective:  Vital signs in last 24 hours:  Temp:  [97.6 F (36.4 C)-98.1 F (36.7 C)] 97.8 F (36.6 C) (07/24 1200) Pulse Rate:  [84-104] 94 (07/24 1300) Resp:  [16-28] 23 (07/24 1300) BP: (81-120)/(42-67) 113/63 (07/24 1300) SpO2:  [79 %-100 %] 93 % (07/24 1300) Weight:  [95.9 kg] 95.9 kg (07/23 1800)  Weight change:  Filed Weights   01/18/2020 1533 01/11/20 1800  Weight: 75.8 kg (!) 95.9 kg    Intake/Output: I/O last 3 completed shifts: In: 787.7 [P.O.:520; I.V.:86.6; IV Piggyback:181.1] Out: 885 [Urine:885]   Intake/Output this shift:  Total I/O In: 223.8 [P.O.:118; I.V.:33.9; IV Piggyback:71.9] Out: 80 [Urine:80]  Physical Exam: General: NAD   Head: Normocephalic, atraumatic. Moist oral mucosal membranes  Eyes: Anicteric  Neck: Supple, trachea midline  Lungs:  Clear to auscultation  Heart: Regular rate and rhythm  Abdomen:  Soft, nontender, BS present  Extremities: + peripheral edema, L BKA stump wrapped  Neurologic: Nonfocal, moving all four extremities  Skin: No lesions  GU: Suprapubic foley catheter    Basic Metabolic Panel: Recent Labs  Lab 01/06/20 0513 01/06/20 0513 01/07/20 0309 01/07/20 0309 01/09/20 2130 01/09/20 0446 01/10/20 0526 01/10/20 0526 01/10/20 2146 01/11/20 0443 01/12/20 0529  NA 132*   < > 131*   < > 127*  --  128*  --  125* 127* 128*  K 4.3   < > 3.8   < > 4.9  --  5.4*  --  5.4* 5.9* 5.0  CL 102   < > 102   < > 95*  --  98  --  94* 92* 94*  CO2 22   < > 23   < > 19*  --  16*  --  20* 20* 18*  GLUCOSE 195*   < > 60*   < > 195*  --  223*  --  239* 296* 159*  BUN 74*   < > 78*   < > 93*  --  92*  --  98* 98* 99*  CREATININE 2.11*   < > 1.93*   < > 2.81*  --  3.07*  --  3.43* 3.55* 3.83*  CALCIUM 7.5*   < > 7.6*   < > 7.7*   < > 7.6*   <  > 7.8* 8.0* 8.2*  MG 2.0  --  2.0  --  2.1  --   --   --  2.2  --   --    < > = values in this interval not displayed.    Liver Function Tests: Recent Labs  Lab 01/11/20 0443  AST 158*  ALT 99*  ALKPHOS 239*  BILITOT 1.3*  PROT 6.2*  ALBUMIN 2.5*   No results for input(s): LIPASE, AMYLASE in the last 168 hours. No results for input(s): AMMONIA in the last 168 hours.  CBC: Recent Labs  Lab 01/06/20 0513 01/07/20 0309 01/09/20 0446 01/11/20 0443 01/12/20 0529  WBC 14.7* 15.0* 12.8* 14.4* 13.1*  NEUTROABS 10.9* 10.9* 9.9*  --   --   HGB 8.2* 8.1* 8.5* 8.3* 8.1*  HCT 23.9* 23.9* 25.7* 26.7* 24.8*  MCV 78.9* 79.1* 81.3 85.0 83.5  PLT 497* 550* 570* 550* 440*    Cardiac Enzymes: No results for input(s): CKTOTAL, CKMB, CKMBINDEX, TROPONINI in the last 168 hours.  BNP: Invalid input(s): POCBNP  CBG: Recent Labs  Lab 01/11/20 0759 01/11/20 1236 01/11/20 1811 01/12/20 0740 01/12/20 1136  GLUCAP 283* 226* 202* 161* 130*    Microbiology: Results for orders placed or performed during the hospital encounter of 12/27/2019  Blood Cultures x 2 sites     Status: None   Collection Time: 01/02/2020  8:48 PM   Specimen: BLOOD  Result Value Ref Range Status   Specimen Description BLOOD BLOOD LEFT FOREARM  Final   Special Requests   Final    BOTTLES DRAWN AEROBIC AND ANAEROBIC Blood Culture adequate volume   Culture   Final    NO GROWTH 5 DAYS Performed at [facility], [address] [place], [place] [address]    Report Status 01/03/2020 FINAL  Final  Culture, blood (Routine X 2) w Reflex to ID Panel     Status: None   Collection Time: 12/27/19  7:01 PM   Specimen: BLOOD  Result Value Ref Range Status   Specimen Description BLOOD BLOOD RIGHT HAND  Final   Special Requests   Final    BOTTLES DRAWN AEROBIC AND ANAEROBIC Blood Culture adequate volume   Culture   Final    NO GROWTH 5 DAYS Performed at [facility], [address], [place], [place]  [address]    Report Status 12/21/2019 FINAL  Final  SARS Coronavirus 2 by RT PCR (hospital order, performed in [facility] hospital lab) Nasopharyngeal Nasopharyngeal Swab     Status: None   Collection Time: 12/27/19  8:45 PM   Specimen: Nasopharyngeal Swab  Result Value Ref Range Status   SARS Coronavirus 2 NEGATIVE NEGATIVE Final    Comment: (NOTE) SARS-CoV-2 target nucleic acids are NOT DETECTED.  The SARS-CoV-2 RNA is generally detectable in upper and lower respiratory specimens during the acute phase of infection. The lowest concentration of SARS-CoV-2 viral copies this assay can detect is 250 copies / mL. A negative result does not preclude SARS-CoV-2 infection and should not be used as the sole basis for treatment or other patient management decisions.  A negative result may occur with improper specimen collection / handling, submission of specimen other than nasopharyngeal swab, presence of viral mutation(s) within the areas targeted by this assay, and inadequate number of viral copies (<250 copies / mL). A negative result must be combined with clinical observations, patient history, and epidemiological information.  Fact Sheet for Patients:   StrictlyIdeas.no  Fact Sheet for Healthcare Providers: BankingDealers.co.za  This test is not yet approved or  cleared by the [place] FDA and has been authorized for detection and/or diagnosis of SARS-CoV-2 by FDA under an Emergency Use Authorization (EUA).  This EUA will remain in effect (meaning this test can be used) for the duration of the COVID-19 declaration under Section 564(b)(1) of the Act, 21 U.S.C. section 360bbb-3(b)(1), unless the authorization is terminated or revoked sooner.  Performed at [facility], [address] [place], [place] [address]   MRSA PCR Screening     Status: None   Collection Time: 12/27/19  8:45 PM   Specimen: Nasopharyngeal  Result  Value Ref Range Status   MRSA by PCR NEGATIVE NEGATIVE Final    Comment:        The GeneXpert MRSA Assay (FDA approved for NASAL specimens only), is one component of a comprehensive MRSA colonization surveillance program. It is not intended to diagnose MRSA infection nor to guide or monitor treatment for MRSA infections. Performed at [facility], [address], [place], [place] [address]   Urine Culture     Status: Abnormal   Collection Time: 01/08/20 10:45 AM   Specimen: Urine, Random  Result Value Ref Range Status   Specimen Description   Final    URINE, RANDOM Performed at [facility], [address], [place], [place] [address]    Special Requests   Final    NONE Performed at [facility], [address] [place], [place] [address]    Culture >=100,000 COLONIES/mL YEAST (A)  Final   Report Status 01/09/2020 FINAL  Final    Coagulation Studies: No results for input(s): LABPROT, INR in the last 72 hours.  Urinalysis: No results for input(s): COLORURINE, LABSPEC, PHURINE, GLUCOSEU, HGBUR, BILIRUBINUR, KETONESUR, PROTEINUR, UROBILINOGEN, NITRITE, LEUKOCYTESUR in the last 72 hours.  Invalid input(s): APPERANCEUR    Imaging: ECHOCARDIOGRAM COMPLETE  Result Date: 01/11/2020    ECHOCARDIOGRAM REPORT   Patient Name:   [name] Date of Exam: 01/10/2020 Medical Rec #:  [id number]      Height:       74.0 in Accession #:    [id number]     Weight:       167.0 lb Date of Birth:  [birth year]      BSA:          2.013 m Patient Age:    79 years       BP:           108/79 mmHg Patient Gender: M              HR:           88 bpm. Exam Location:  [facility] Procedure: 2D Echo, Cardiac Doppler and Color Doppler Indications:     CHF-Acute Systolic 854.62 / V03.50  History:         Patient has no prior history of Echocardiogram examinations.                  Risk Factors:Hypertension.  Sonographer:     Alyse Low Roar Referring Phys:  [id number] [facility] Diagnosing  Phys: Bartholome Bill MD IMPRESSIONS  1. Left ventricular ejection fraction, by estimation, is 30 to 35%. The left ventricle has moderately decreased function. The left ventricle demonstrates global hypokinesis. The left ventricular internal cavity size was mildly dilated. Left ventricular diastolic parameters were normal.  2. Right ventricular systolic function was not well visualized. The right ventricular size is mildly enlarged. There is moderately elevated pulmonary artery systolic pressure.  3. Left atrial size was mildly dilated.  4. The mitral valve is grossly normal. Mild mitral valve regurgitation.  5. Tricuspid valve regurgitation is mild to moderate.  6. The aortic valve is grossly normal. Aortic valve regurgitation is trivial. FINDINGS  Left Ventricle: Left ventricular ejection fraction, by estimation, is 30 to 35%. The left ventricle has moderately decreased function. The left ventricle demonstrates global hypokinesis. The left ventricular internal cavity size was mildly dilated. There is no left ventricular hypertrophy. Left ventricular diastolic parameters were normal. Right Ventricle: The right ventricular size is mildly enlarged. No increase in right ventricular wall thickness. Right ventricular systolic function was not well visualized. There is moderately elevated pulmonary artery systolic pressure. The tricuspid regurgitant velocity is 3.08 m/s, and with an assumed right atrial pressure of 10 mmHg, the estimated right ventricular systolic pressure is 29.9 mmHg. Left Atrium: Left atrial size was mildly dilated. Right Atrium: Right atrial size was not well visualized. Pericardium: There is  no evidence of pericardial effusion. Mitral Valve: The mitral valve is grossly normal. Mild mitral valve regurgitation. Tricuspid Valve: The tricuspid valve is not well visualized. Tricuspid valve regurgitation is mild to moderate. Aortic Valve: The aortic valve is grossly normal. Aortic valve regurgitation is  trivial. Aortic valve mean gradient measures 2.0 mmHg. Aortic valve peak gradient measures 3.1 mmHg. Aortic valve area, by VTI measures 1.94 cm. Pulmonic Valve: The pulmonic valve was not well visualized. Pulmonic valve regurgitation is trivial. Aorta: The aortic root is normal in size and structure. IAS/Shunts: The interatrial septum was not assessed.  LEFT VENTRICLE PLAX 2D LVIDd:         5.49 cm  Diastology LVIDs:         4.80 cm  LV e' lateral:   6.20 cm/s LV PW:         1.27 cm  LV E/e' lateral: 23.5 LV IVS:        1.20 cm  LV e' medial:    5.11 cm/s LVOT diam:     2.00 cm  LV E/e' medial:  28.6 LV SV:         28 LV SV Index:   14 LVOT Area:     3.14 cm  RIGHT VENTRICLE RV S prime:     5.87 cm/s LEFT ATRIUM             Index       RIGHT ATRIUM           Index LA diam:        4.70 cm 2.33 cm/m  RA Area:     25.90 cm LA Vol (A2C):   80.3 ml 39.89 ml/m RA Volume:   90.50 ml  44.95 ml/m LA Vol (A4C):   84.6 ml 42.02 ml/m LA Biplane Vol: 85.4 ml 42.42 ml/m  AORTIC VALVE                   PULMONIC VALVE AV Area (Vmax):    2.01 cm    PV Vmax:        0.65 m/s AV Area (Vmean):   1.67 cm    PV Peak grad:   1.7 mmHg AV Area (VTI):     1.94 cm    RVOT Peak grad: 1 mmHg AV Vmax:           87.50 cm/s AV Vmean:          65.600 cm/s AV VTI:            0.142 m AV Peak Grad:      3.1 mmHg AV Mean Grad:      2.0 mmHg LVOT Vmax:         55.90 cm/s LVOT Vmean:        34.900 cm/s LVOT VTI:          0.088 m LVOT/AV VTI ratio: 0.62  AORTA Ao Root diam: 3.00 cm MITRAL VALVE                TRICUSPID VALVE MV Area (PHT): 5.31 cm     TR Peak grad:   37.9 mmHg MV Decel Time: 143 msec     TR Vmax:        308.00 cm/s MV E velocity: 146.00 cm/s                             SHUNTS  Systemic VTI:  0.09 m                             Systemic Diam: 2.00 cm Bartholome Bill MD Electronically signed by Bartholome Bill MD Signature Date/Time: 01/11/2020/8:57:24 AM    Final      Medications:   . sodium chloride 250  mL (01/05/20 0616)  . albumin human Stopped (01/12/20 0959)  . milrinone 0.3 mcg/kg/min (01/12/20 1200)   . aspirin EC  81 mg Oral Daily  . Chlorhexidine Gluconate Cloth  6 each Topical Daily  . docusate sodium  100 mg Oral BID  . feeding supplement (NEPRO CARB STEADY)  237 mL Oral TID BM  . ferrous sulfate  325 mg Oral Q breakfast  . fluticasone  2 spray Each Nare Daily  . furosemide  40 mg Intravenous Q12H  . gabapentin  100 mg Oral QHS  . heparin  5,000 Units Subcutaneous Q8H  . insulin aspart  0-15 Units Subcutaneous TID WC  . insulin glargine  10 Units Subcutaneous QHS  . lidocaine  1 patch Transdermal Q24H  . loratadine  10 mg Oral Daily  . multivitamin with minerals  1 tablet Oral Daily  . pantoprazole (PROTONIX) IV  40 mg Intravenous BID  . patiromer  16.8 g Oral Daily  . polyethylene glycol  17 g Oral Daily  . simethicone  80 mg Oral QID  . simvastatin  10 mg Oral QHS  . sodium chloride flush  3 mL Intravenous Once  . tamsulosin  0.4 mg Oral Daily  . vitamin B-12  1,000 mcg Oral Daily   sodium chloride, acetaminophen **OR** acetaminophen, alum & mag hydroxide-simeth, calcium carbonate, guaiFENesin-dextromethorphan, ondansetron **OR** ondansetron (ZOFRAN) IV, oxyCODONE, polyvinyl alcohol, promethazine, sodium phosphate  Assessment/ Plan:   Mr. ASHE GAGO is a 79 y.o. 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 Lucky Cowboy  1. Acute renal  failure with hyperkalemia and metabolic acidosis on chronic kidney diease stage IV with baseline creatinine of 2.24, GFR of 26 on 02/11/2018. Chronic kidney disease secondary to diabetic nephropathy, hypertension and obstructive uropathy.  No IV contrast. Renal ultrasound with no obstruction.  Acute renal failure seems to be related to concurrent illness -Renal function appears to be worse.  Creatinine up to 3.8 with BUN of 99.  Convert patient from intravenous Lasix boluses to Lasix drip.  May need to consider dialysis if urine output drops.  2. Hypertension: with peripheral edema. New onset systolic congestive heart failure Appreciate cardiology input.  -Convert to Lasix drip.  3. Hyponatremia: secondary to volume overload and renal failure.  -Sodium slightly improved to 128.  Continue Lasix as above.   LOS: 17 Mahlani Berninger 7/24/20211:17 PM

## 2020-01-12 NOTE — Progress Notes (Signed)
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 Patient was started on IV vancomycin, Rocephin and Flagyl, podiatry and vascular surgeon were consulted. Patient had a angiogram and revascularization on 7/12. But the foot gangrene does not seem to be improving, as a result, planning for below-knee amputation. 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 Cardiology saw patient and recommendation for milrinone drip with diuresis. PCCM consult for further evaluation and management.   Lines/tubes : Suprapubic Catheter (Active)  Site Assessment Clean 01/11/20 0851  Dressing Status None 01/11/20 0851  Dressing Type Other (Comment) 01/09/20 2001  Collection Container Leg bag 01/11/20 0851  Securement Method Ties;Securement Device 01/11/20 0851  Output (mL) 225 mL 01/11/20 1429    Microbiology/Sepsis markers: Results for orders placed or performed during the hospital encounter of 01/07/2020  Blood Cultures x 2 sites     Status: None   Collection Time: 12/24/2019  8:48 PM   Specimen: BLOOD  Result Value Ref Range Status   Specimen Description BLOOD BLOOD LEFT FOREARM  Final   Special Requests   Final     BOTTLES DRAWN AEROBIC AND ANAEROBIC Blood Culture adequate volume   Culture   Final    NO GROWTH 5 DAYS Performed at [facility], [address] [place], [place] [address]    Report Status 12/30/2019 FINAL  Final  Culture, blood (Routine X 2) w Reflex to ID Panel     Status: None   Collection Time: 12/27/19  7:01 PM   Specimen: BLOOD  Result Value Ref Range Status   Specimen Description BLOOD BLOOD RIGHT HAND  Final   Special Requests   Final    BOTTLES DRAWN AEROBIC AND ANAEROBIC Blood Culture adequate volume   Culture   Final    NO GROWTH 5 DAYS Performed at [facility], [address], [place],  [address]    Report Status 01/15/2020 FINAL  Final  SARS Coronavirus 2 by RT PCR (hospital order, performed in [facility] hospital lab) Nasopharyngeal Nasopharyngeal Swab     Status: None   Collection Time: 12/27/19  8:45 PM   Specimen: Nasopharyngeal Swab  Result Value Ref Range Status   SARS Coronavirus 2 NEGATIVE NEGATIVE Final    Comment: (NOTE) SARS-CoV-2 target nucleic acids are NOT DETECTED.  The SARS-CoV-2 RNA is generally detectable in upper and lower respiratory specimens during the acute phase of infection. The lowest concentration of SARS-CoV-2 viral copies this assay can detect is 250 copies / mL. A negative result does not preclude SARS-CoV-2 infection and should not be used as the sole basis for treatment or other patient management decisions.  A negative result may occur with improper specimen collection / handling, submission of specimen other than nasopharyngeal swab, presence of viral mutation(s) within the areas targeted by this assay, and inadequate number of viral copies (<250 copies / mL). A negative result must be combined with clinical observations, patient history, and epidemiological information.  Fact Sheet for  Patients:   StrictlyIdeas.no  Fact Sheet for Healthcare  Providers: BankingDealers.co.za  This test is not yet approved or  cleared by the [place] FDA and has been authorized for detection and/or diagnosis of SARS-CoV-2 by FDA under an Emergency Use Authorization (EUA).  This EUA will remain in effect (meaning this test can be used) for the duration of the COVID-19 declaration under Section 564(b)(1) of the Act, 21 U.S.C. section 360bbb-3(b)(1), unless the authorization is terminated or revoked sooner.  Performed at [facility], [address] [place], [place] [address]   MRSA PCR Screening     Status: None   Collection Time: 12/27/19  8:45 PM   Specimen: Nasopharyngeal  Result Value Ref Range Status   MRSA by PCR NEGATIVE NEGATIVE Final    Comment:        The GeneXpert MRSA Assay (FDA approved for NASAL specimens only), is one component of a comprehensive MRSA colonization surveillance program. It is not intended to diagnose MRSA infection nor to guide or monitor treatment for MRSA infections. Performed at [facility], [address] [place], [place] [address]   Urine Culture     Status: Abnormal   Collection Time: 01/08/20 10:45 AM   Specimen: Urine, Random  Result Value Ref Range Status   Specimen Description   Final    URINE, RANDOM Performed at [facility], [address] [place], [place] [address]    Special Requests   Final    NONE Performed at [facility], [address] [place], [place] [address]    Culture >=100,000 COLONIES/mL YEAST (A)  Final   Report Status 01/09/2020 FINAL  Final    Anti-infectives:  Anti-infectives (From admission, onward)   Start     Dose/Rate Route Frequency Ordered Stop   01/09/20 1600  fluconazole (DIFLUCAN) IVPB 100 mg  Status:  Discontinued        100 mg 50 mL/hr over 60 Minutes Intravenous Every 24 hours 01/09/20 1558 01/09/20 1601   01/08/20 1300  meropenem (MERREM) 1,000 mg in sodium chloride 0.9 % 100 mL  IVPB  Status:  Discontinued        1,000 mg 200 mL/hr over 30 Minutes Intravenous Every 12 hours 01/08/20 1120 01/09/20 1140   01/06/20 2100  vancomycin (VANCOREADY) IVPB 750 mg/150 mL  Status:  Discontinued        750 mg 150 mL/hr over 60 Minutes Intravenous Every 24 hours 01/06/20 0614 01/06/20 0847   01/06/20 0800  Ampicillin-Sulbactam (UNASYN) 3 g in sodium chloride 0.9 % 100 mL IVPB  Status:  Discontinued        3 g 200 mL/hr over 30 Minutes Intravenous Every 12 hours 01/06/20 0625 01/06/20 0847   01/06/20 0613  Ampicillin-Sulbactam (UNASYN) 3 g in sodium chloride 0.9 % 100 mL IVPB  Status:  Discontinued        3 g 200 mL/hr over 30 Minutes Intravenous Every 12 hours 01/06/20 0614 01/06/20 0625   01/04/20 1800  Ampicillin-Sulbactam (UNASYN) 3 g in sodium chloride 0.9 % 100 mL IVPB  Status:  Discontinued        3 g 200 mL/hr over 30 Minutes Intravenous Every 12 hours 01/04/20 0912 01/06/20 0614   01/18/2020 1008  ceFAZolin (ANCEF) 2-4 GM/100ML-% IVPB       Note to Pharmacy: Leonia Reader   : cabinet override      01/11/2020 1008 01/16/2020 2214   12/23/2019 1007  levofloxacin (LEVAQUIN) 500 MG/100ML IVPB  Status:  Discontinued       Note to Pharmacy: Leonia Reader   : cabinet override      12/22/2019 1007 01/13/2020 1620   01/10/2020 1215  ceFAZolin (ANCEF) IVPB 1 g/50 mL premix        1 g 100 mL/hr over 30 Minutes Intravenous  Once 01/14/2020 1206 01/07/2020 1039   12/25/2019 1200  ceFAZolin (ANCEF) 1,000 mg in dextrose 5 % 100 mL IVPB  Status:  Discontinued        1,000 mg 220 mL/hr over 30 Minutes Intravenous  Once 01/16/2020 1159 12/27/2019 1205   12/28/2019 1000  ceFAZolin (ANCEF) IVPB 2g/100 mL premix  Status:  Discontinued        2 g 200 mL/hr over 30 Minutes Intravenous  Once 01/07/2020 0932 01/15/2020 1159   12/30/19 0830  metroNIDAZOLE (FLAGYL) IVPB 500 mg  Status:  Discontinued        500 mg 100 mL/hr over 60 Minutes Intravenous Every 8 hours 12/30/19 0822 01/04/20 0912   12/27/19 1900   vancomycin (VANCOCIN) IVPB 1000 mg/200 mL premix  Status:  Discontinued        1,000 mg 200 mL/hr over 60 Minutes Intravenous Every 24 hours 12/29/2019 2043 12/27/19 1217   12/27/19 1800  vancomycin (VANCOREADY) IVPB 750 mg/150 mL  Status:  Discontinued        750 mg 150 mL/hr over 60 Minutes Intravenous Every 24 hours 12/27/19 1217 01/06/20 0614   01/05/2020 2200  metroNIDAZOLE (FLAGYL) tablet 500 mg  Status:  Discontinued        500 mg Oral Every 8 hours 01/16/2020 2009 12/30/19 0822   12/24/2019 2045  vancomycin (VANCOREADY) IVPB 500 mg/100 mL        500 mg 100 mL/hr over 60 Minutes Intravenous  Once 01/11/2020 2040 12/27/19 0000   01/19/2020 2015  cefTRIAXone (ROCEPHIN) 2 g in sodium chloride 0.9 % 100 mL IVPB  Status:  Discontinued        2 g 200 mL/hr over 30 Minutes Intravenous Every 24 hours 01/11/2020 2009 01/04/20 0912   01/12/2020 1815  vancomycin (VANCOCIN) IVPB 1000 mg/200 mL premix        1,000 mg 200 mL/hr over 60 Minutes Intravenous  Once 12/21/2019 1801 01/19/2020 2023   01/11/2020 1815  piperacillin-tazobactam (ZOSYN) IVPB 3.375 g        3.375 g 100 mL/hr over 30 Minutes Intravenous  Once 12/25/2019 1801 01/10/2020 1953       Consults: Treatment Team:  Minna Merritts, MD Lin Landsman, MD [facility], [facility], MD     PAST MEDICAL HISTORY   Past Medical History:  Diagnosis Date  . Carcinoma of skin   . Cauda equina compression ([place])   . Diabetic retinopathy ([place])   . GERD (gastroesophageal reflux disease)   . Hypertension      SURGICAL HISTORY   Past Surgical History:  Procedure Laterality Date  . AMPUTATION Left 01/03/2020   Procedure: LEFT BELOW KNEE AMPUTATION;  Surgeon: Algernon Huxley, MD;  Location: [facility] ORS;  Service: General;  Laterality: Left;  . BACK SURGERY  07/2012  . CARDIAC CATHETERIZATION  2005  . CATARACT EXTRACTION  2014   right eye   . CORONARY ARTERY BYPASS GRAFT  2005    CABG x 7 at [place]     left knee  . LOWER EXTREMITY  ANGIOGRAPHY Left 01/10/2020   Procedure: Lower Extremity Angiography;  Surgeon: Algernon Huxley, MD;  Location: [facility] CV LAB;  Service: Cardiovascular;  Laterality: Left;     FAMILY HISTORY   Family History  Problem Relation Age of Onset  . Hypertension Sister   . Hyperlipidemia Sister      SOCIAL HISTORY   Social History   Tobacco Use  . Smoking status: Former Smoker    Years: 45.00    Types: Pipe    Quit date: 2016    Years since quitting: 5.5  . Smokeless tobacco: Former Network engineer Use Topics  . Alcohol use: Yes    Alcohol/week: 0.0 standard drinks    Comment: occasional.  . Drug use: No     MEDICATIONS   Current Medication:  Current Facility-Administered Medications:  .  0.9 %  sodium chloride infusion, , Intravenous, PRN, Sharen Hones, MD, Last Rate: 10 mL/hr at 01/05/20 0616, 250 mL at 01/05/20 0616 .  acetaminophen (TYLENOL) tablet 650 mg, 650 mg, Oral, Q6H PRN, 650 mg at 01/12/20 0827 **OR** acetaminophen (TYLENOL) suppository 650 mg, 650 mg, Rectal, Q6H PRN, Lucky Cowboy, Erskine Squibb, MD .  albumin human 25 % solution 25 g, 25 g, Intravenous, Q12H, Lavonia Dana, MD, Stopped at 01/12/20 0959 .  alum & mag hydroxide-simeth (MAALOX/MYLANTA) 200-200-20 MG/5ML suspension 15 mL, 15 mL, Oral, Q6H PRN, Algernon Huxley, MD, 15 mL at 01/04/20 2303 .  aspirin EC tablet 81 mg, 81 mg, Oral, Daily, Algernon Huxley, MD, 81 mg at 01/10/20 0837 .  calcium carbonate (TUMS - dosed in mg elemental calcium) chewable tablet 200 mg of elemental calcium, 1 tablet, Oral, TID PRN, Algernon Huxley, MD, 200 mg of elemental calcium at 01/12/20 0827 .  Chlorhexidine Gluconate Cloth 2 % PADS 6 each, 6 each, Topical, Daily, Dew, Erskine Squibb, MD, 6 each at 01/12/20 1008 .  docusate sodium (COLACE) capsule 100 mg, 100 mg, Oral, BID, Nolberto Hanlon, MD, 100 mg at 01/10/20 2222 .  feeding supplement (NEPRO CARB STEADY) liquid 237 mL, 237 mL, Oral, TID BM, Nolberto Hanlon, MD, 237 mL at 01/10/20 2038 .  ferrous  sulfate tablet 325 mg, 325 mg, Oral, Q breakfast, Sharen Hones, MD, 325 mg at 01/11/20 4656 .  fluticasone (FLONASE) 50 MCG/ACT nasal spray 2 spray, 2 spray, Each Nare, Daily, Dew, Erskine Squibb, MD, 2 spray at 01/11/20 1445 .  furosemide (LASIX) 250 mg in dextrose 5 % 250 mL (1 mg/mL) infusion, 8 mg/hr, Intravenous, Continuous, Lateef, Munsoor, MD .  gabapentin (NEURONTIN) capsule 100 mg, 100 mg, Oral, QHS, Dew, Erskine Squibb, MD, 100 mg at 01/10/20 2222 .  guaiFENesin-dextromethorphan (ROBITUSSIN DM) 100-10 MG/5ML syrup 10 mL, 10 mL, Oral, Q6H PRN, Algernon Huxley, MD .  heparin injection 5,000 Units, 5,000 Units, Subcutaneous, Q8H, Sharen Hones, MD, 5,000 Units at 01/12/20 1203 .  insulin aspart (novoLOG) injection 0-15 Units, 0-15 Units, Subcutaneous, TID WC, Algernon Huxley, MD, 2 Units at 01/12/20 1155 .  insulin glargine (LANTUS) injection 10 Units, 10 Units, Subcutaneous, QHS, Ottie Glazier, MD, 10 Units at 01/11/20 2250 .  lidocaine (LIDODERM) 5 % 1 patch, 1 patch, Transdermal, Q24H, Dew, Erskine Squibb, MD, 1 patch at 01/12/20 1203 .  loratadine (CLARITIN) tablet 10 mg, 10 mg, Oral, Daily, Dew, Erskine Squibb, MD, 10 mg at 01/10/20 0837 .  milrinone (PRIMACOR) 20 MG/100 ML (0.2 mg/mL) infusion, 0.3 mcg/kg/min, Intravenous, Continuous, Amery, Sahar, MD, Last Rate: 6.82 mL/hr at 01/12/20 1200, 0.3 mcg/kg/min at 01/12/20 1200 .  multivitamin with minerals tablet 1 tablet, 1 tablet, Oral, Daily, Dew,  Erskine Squibb, MD, 1 tablet at 01/10/20 [phone] .  ondansetron (ZOFRAN) tablet 4 mg, 4 mg, Oral, Q6H PRN, 4 mg at 01/09/20 1508 **OR** ondansetron (ZOFRAN) injection 4 mg, 4 mg, Intravenous, Q6H PRN, Algernon Huxley, MD, 4 mg at 01/12/20 0827 .  oxyCODONE (Oxy IR/ROXICODONE) immediate release tablet 5 mg, 5 mg, Oral, Q6H PRN, Lang Snow, NP, 5 mg at 01/12/20 0323 .  pantoprazole (PROTONIX) injection 40 mg, 40 mg, Intravenous, BID, Lanney Gins, Marye Eagen, MD, 40 mg at 01/12/20 1008 .  patiromer Daryll Drown) packet 16.8 g, 16.8 g, Oral,  Daily, Kolluru, Sarath, MD .  polyethylene glycol (MIRALAX / GLYCOLAX) packet 17 g, 17 g, Oral, Daily, [name], Sahar, MD, 17 g at 01/10/20 1501 .  polyvinyl alcohol (LIQUIFILM TEARS) 1.4 % ophthalmic solution 2 drop, 2 drop, Both Eyes, PRN, Dew, Erskine Squibb, MD .  promethazine (PHENERGAN) injection 12.5 mg, 12.5 mg, Intravenous, Q6H PRN, Algernon Huxley, MD, 12.5 mg at 01/12/20 1211 .  simethicone (MYLICON) chewable tablet 80 mg, 80 mg, Oral, QID, Nolberto Hanlon, MD, 80 mg at 01/10/20 2222 .  simvastatin (ZOCOR) tablet 10 mg, 10 mg, Oral, QHS, Dew, Erskine Squibb, MD, 10 mg at 01/10/20 2222 .  sodium chloride flush (NS) 0.9 % injection 3 mL, 3 mL, Intravenous, Once, Dew, Erskine Squibb, MD .  sodium phosphate (FLEET) 7-19 GM/118ML enema 1 enema, 1 enema, Rectal, Daily PRN, Algernon Huxley, MD, 1 enema at 01/11/20 1438 .  tamsulosin (FLOMAX) capsule 0.4 mg, 0.4 mg, Oral, Daily, Dew, Erskine Squibb, MD, 0.4 mg at 01/10/20 0837 .  vitamin B-12 (CYANOCOBALAMIN) tablet 1,000 mcg, 1,000 mcg, Oral, Daily, Sharen Hones, MD, 1,000 mcg at 01/10/20 4782    ALLERGIES   Ivp dye [iodinated diagnostic agents], Other, and Codeine    REVIEW OF SYSTEMS   10 point ROS unable to perform due to mild AMS  PHYSICAL EXAMINATION   Vital Signs: Temp:  [97.6 F (36.4 C)-98.1 F (36.7 C)] 97.8 F (36.6 C) (07/24 1200) Pulse Rate:  [84-104] 94 (07/24 1300) Resp:  [16-28] 23 (07/24 1300) BP: (81-120)/(42-67) 113/63 (07/24 1300) SpO2:  [79 %-100 %] 93 % (07/24 1300) Weight:  [95.9 kg] 95.9 kg (07/23 1800)  GENERAL:Age appropirate HEAD: Normocephalic, atraumatic.  EYES: Pupils equal, round, reactive to light.  No scleral icterus.  MOUTH: Moist mucosal membrane. NECK: Supple. No thyromegaly. No nodules. No JVD.  PULMONARY: crackles bilaterally  CARDIOVASCULAR: S1 and S2. Regular rate and rhythm. No murmurs, rubs, or gallops.  GASTROINTESTINAL: Soft, nontender, non-distended. No masses. Positive bowel sounds. No hepatosplenomegaly.  +suprapubic catheter MUSCULOSKELETAL: No swelling, clubbing, or edema.  NEUROLOGIC: Mild distress due to acute illness SKIN:intact,warm,dry   PERTINENT DATA     Infusions: . sodium chloride 250 mL (01/05/20 0616)  . albumin human Stopped (01/12/20 0959)  . furosemide (LASIX) infusion    . milrinone 0.3 mcg/kg/min (01/12/20 1200)   Scheduled Medications: . aspirin EC  81 mg Oral Daily  . Chlorhexidine Gluconate Cloth  6 each Topical Daily  . docusate sodium  100 mg Oral BID  . feeding supplement (NEPRO CARB STEADY)  237 mL Oral TID BM  . ferrous sulfate  325 mg Oral Q breakfast  . fluticasone  2 spray Each Nare Daily  . gabapentin  100 mg Oral QHS  . heparin  5,000 Units Subcutaneous Q8H  . insulin aspart  0-15 Units Subcutaneous TID WC  . insulin glargine  10 Units Subcutaneous QHS  . lidocaine  1 patch Transdermal  Q24H  . loratadine  10 mg Oral Daily  . multivitamin with minerals  1 tablet Oral Daily  . pantoprazole (PROTONIX) IV  40 mg Intravenous BID  . patiromer  16.8 g Oral Daily  . polyethylene glycol  17 g Oral Daily  . simethicone  80 mg Oral QID  . simvastatin  10 mg Oral QHS  . sodium chloride flush  3 mL Intravenous Once  . tamsulosin  0.4 mg Oral Daily  . vitamin B-12  1,000 mcg Oral Daily   PRN Medications: sodium chloride, acetaminophen **OR** acetaminophen, alum & mag hydroxide-simeth, calcium carbonate, guaiFENesin-dextromethorphan, ondansetron **OR** ondansetron (ZOFRAN) IV, oxyCODONE, polyvinyl alcohol, promethazine, sodium phosphate Hemodynamic parameters:   Intake/Output: 07/23 0701 - 07/24 0700 In: 787.7 [P.O.:520; I.V.:86.6; IV Piggyback:181.1] Out: 655 [Urine:655]  Ventilator  Settings:     LAB RESULTS:  Basic Metabolic Panel: Recent Labs  Lab 01/06/20 0513 01/06/20 0513 01/07/20 0309 01/07/20 0309 01/09/20 9417 01/09/20 0446 01/10/20 0526 01/10/20 0526 01/10/20 2146 01/10/20 2146 01/11/20 0443 01/12/20 0529  NA 132*   < >  131*   < > 127*  --  128*  --  125*  --  127* 128*  K 4.3   < > 3.8   < > 4.9   < > 5.4*   < > 5.4*   < > 5.9* 5.0  CL 102   < > 102   < > 95*  --  98  --  94*  --  92* 94*  CO2 22   < > 23   < > 19*  --  16*  --  20*  --  20* 18*  GLUCOSE 195*   < > 60*   < > 195*  --  223*  --  239*  --  296* 159*  BUN 74*   < > 78*   < > 93*  --  92*  --  98*  --  98* 99*  CREATININE 2.11*   < > 1.93*   < > 2.81*  --  3.07*  --  3.43*  --  3.55* 3.83*  CALCIUM 7.5*   < > 7.6*   < > 7.7*  --  7.6*  --  7.8*  --  8.0* 8.2*  MG 2.0  --  2.0  --  2.1  --   --   --  2.2  --   --   --    < > = values in this interval not displayed.   Liver Function Tests: Recent Labs  Lab 01/11/20 0443  AST 158*  ALT 99*  ALKPHOS 239*  BILITOT 1.3*  PROT 6.2*  ALBUMIN 2.5*   No results for input(s): LIPASE, AMYLASE in the last 168 hours. No results for input(s): AMMONIA in the last 168 hours. CBC: Recent Labs  Lab 01/06/20 0513 01/07/20 0309 01/09/20 0446 01/11/20 0443 01/12/20 0529  WBC 14.7* 15.0* 12.8* 14.4* 13.1*  NEUTROABS 10.9* 10.9* 9.9*  --   --   HGB 8.2* 8.1* 8.5* 8.3* 8.1*  HCT 23.9* 23.9* 25.7* 26.7* 24.8*  MCV 78.9* 79.1* 81.3 85.0 83.5  PLT 497* 550* 570* 550* 440*   Cardiac Enzymes: No results for input(s): CKTOTAL, CKMB, CKMBINDEX, TROPONINI in the last 168 hours. BNP: Invalid input(s): POCBNP CBG: Recent Labs  Lab 01/11/20 0759 01/11/20 1236 01/11/20 1811 01/12/20 0740 01/12/20 1136  GLUCAP 283* 226* 202* 161* 130*       IMAGING RESULTS:  Imaging: ECHOCARDIOGRAM COMPLETE  Result  Date: 01/11/2020    ECHOCARDIOGRAM REPORT   Patient Name:   [name] [facility] Date of Exam: 01/10/2020 Medical Rec #:  [id number]      Height:       74.0 in Accession #:    [id number]     Weight:       167.0 lb Date of Birth:  [birth year]      BSA:          2.013 m Patient Age:    72 years       BP:           108/79 mmHg Patient Gender: M              HR:           88 bpm. Exam Location:  [facility] Procedure:  2D Echo, Cardiac Doppler and Color Doppler Indications:     CHF-Acute Systolic 627.03 / J00.93  History:         Patient has no prior history of Echocardiogram examinations.                  Risk Factors:Hypertension.  Sonographer:     Alyse Low Roar Referring Phys:  [id number] [facility] Diagnosing Phys: Bartholome Bill MD IMPRESSIONS  1. Left ventricular ejection fraction, by estimation, is 30 to 35%. The left ventricle has moderately decreased function. The left ventricle demonstrates global hypokinesis. The left ventricular internal cavity size was mildly dilated. Left ventricular diastolic parameters were normal.  2. Right ventricular systolic function was not well visualized. The right ventricular size is mildly enlarged. There is moderately elevated pulmonary artery systolic pressure.  3. Left atrial size was mildly dilated.  4. The mitral valve is grossly normal. Mild mitral valve regurgitation.  5. Tricuspid valve regurgitation is mild to moderate.  6. The aortic valve is grossly normal. Aortic valve regurgitation is trivial. FINDINGS  Left Ventricle: Left ventricular ejection fraction, by estimation, is 30 to 35%. The left ventricle has moderately decreased function. The left ventricle demonstrates global hypokinesis. The left ventricular internal cavity size was mildly dilated. There is no left ventricular hypertrophy. Left ventricular diastolic parameters were normal. Right Ventricle: The right ventricular size is mildly enlarged. No increase in right ventricular wall thickness. Right ventricular systolic function was not well visualized. There is moderately elevated pulmonary artery systolic pressure. The tricuspid regurgitant velocity is 3.08 m/s, and with an assumed right atrial pressure of 10 mmHg, the estimated right ventricular systolic pressure is 37.1 mmHg. Left Atrium: Left atrial size was mildly dilated. Right Atrium: Right atrial size was not well visualized. Pericardium: There is no evidence of  pericardial effusion. Mitral Valve: The mitral valve is grossly normal. Mild mitral valve regurgitation. Tricuspid Valve: The tricuspid valve is not well visualized. Tricuspid valve regurgitation is mild to moderate. Aortic Valve: The aortic valve is grossly normal. Aortic valve regurgitation is trivial. Aortic valve mean gradient measures 2.0 mmHg. Aortic valve peak gradient measures 3.1 mmHg. Aortic valve area, by VTI measures 1.94 cm. Pulmonic Valve: The pulmonic valve was not well visualized. Pulmonic valve regurgitation is trivial. Aorta: The aortic root is normal in size and structure. IAS/Shunts: The interatrial septum was not assessed.  LEFT VENTRICLE PLAX 2D LVIDd:         5.49 cm  Diastology LVIDs:         4.80 cm  LV e' lateral:   6.20 cm/s LV PW:         1.27 cm  LV E/e'  lateral: 23.5 LV IVS:        1.20 cm  LV e' medial:    5.11 cm/s LVOT diam:     2.00 cm  LV E/e' medial:  28.6 LV SV:         28 LV SV Index:   14 LVOT Area:     3.14 cm  RIGHT VENTRICLE RV S prime:     5.87 cm/s LEFT ATRIUM             Index       RIGHT ATRIUM           Index LA diam:        4.70 cm 2.33 cm/m  RA Area:     25.90 cm LA Vol (A2C):   80.3 ml 39.89 ml/m RA Volume:   90.50 ml  44.95 ml/m LA Vol (A4C):   84.6 ml 42.02 ml/m LA Biplane Vol: 85.4 ml 42.42 ml/m  AORTIC VALVE                   PULMONIC VALVE AV Area (Vmax):    2.01 cm    PV Vmax:        0.65 m/s AV Area (Vmean):   1.67 cm    PV Peak grad:   1.7 mmHg AV Area (VTI):     1.94 cm    RVOT Peak grad: 1 mmHg AV Vmax:           87.50 cm/s AV Vmean:          65.600 cm/s AV VTI:            0.142 m AV Peak Grad:      3.1 mmHg AV Mean Grad:      2.0 mmHg LVOT Vmax:         55.90 cm/s LVOT Vmean:        34.900 cm/s LVOT VTI:          0.088 m LVOT/AV VTI ratio: 0.62  AORTA Ao Root diam: 3.00 cm MITRAL VALVE                TRICUSPID VALVE MV Area (PHT): 5.31 cm     TR Peak grad:   37.9 mmHg MV Decel Time: 143 msec     TR Vmax:        308.00 cm/s MV E velocity: 146.00  cm/s                             SHUNTS                             Systemic VTI:  0.09 m                             Systemic Diam: 2.00 cm Bartholome Bill MD Electronically signed by Bartholome Bill MD Signature Date/Time: 01/11/2020/8:57:24 AM    Final    @PROBHOSP @ No results found.   ASSESSMENT AND PLAN    -Multidisciplinary rounds held today  Acute decompensated systolic CHF with EF 99-37%  -Cardiology team on case - appreciate input - ischemic cardiomyopathy - recommendation for continued diuresis and milrinone gtt.  -complicated by severe TR and moderate MR -palliative consultation placed    Acute on chronic Renal Failure -most likely due to hypoperfusion from low effective circulating volume secondary to  end stage CHF - nephrology on case - appreciate input -follow UO -continue diuresis per nephro   Severe protein calorie malnutrition  - bitemporal and peripheral muscle wating  - albumin <2.5  - repletion 1 amp 25% daily     S/p Left BKA   - monitor surgical site for infection   - monitor h/h   - adequate analgesia   Hypervolemic hypotonic hyponatremia   - due to CHF   - treating underlying decompensated HFrEF with ischemic cardiomyopathy   Sever hyperglycemia    - keep POC glucose <200 but >150 while in MICU   - adjusting weight based regimen -Lantus QHS>10units plus ACHS ISS  ID -continue IV abx as prescibed -follow up cultures  GI/Nutrition GI PROPHYLAXIS as indicated DIET-->TF's as tolerated Constipation protocol as indicated  ENDO - ICU hypoglycemic\Hyperglycemia protocol -check FSBS per protocol   ELECTROLYTES -follow labs as needed -replace as needed -pharmacy consultation   DVT/GI PRX ordered -SCDs  TRANSFUSIONS AS NEEDED MONITOR FSBS ASSESS the need for LABS as needed   Critical care provider statement:    Critical care time (minutes):  33   Critical care time was exclusive of:  Separately billable procedures and treating other  patients   Critical care was necessary to treat or prevent imminent or life-threatening deterioration of the following conditions:  acute decompensated systolic CHF, renal failure, protein calorie malnutrition , multiple comorbid conditions   Critical care was time spent personally by me on the following activities:  Development of treatment plan with patient or surrogate, discussions with consultants, evaluation of patient's response to treatment, examination of patient, obtaining history from patient or surrogate, ordering and performing treatments and interventions, ordering and review of laboratory studies and re-evaluation of patient's condition.  I assumed direction of critical care for this patient from another provider in my specialty: no    This document was prepared using Dragon voice recognition software and may include unintentional dictation errors.    Ottie Glazier, M.D.  [facility]

## 2020-01-12 NOTE — Progress Notes (Signed)
PT with occasional dry heaving after drinking water, small amt emesis earlier.  + bowel sounds, ABD is soft.  Per GI MD we can try NGT to LIS if needed.  This Nn tried with a 14 Fr but pt begins screaming with even minimal insertion to left or right nares.  Per his wishes I will not try again. He sleeps fitfully but is awakened by the phone ringing. DC diet for now as he cannot eat, ice chips or water for comfort Ad lib.   His daughter Ginger called this RN earlier today and stated her father "unequivocally lives alone, and he cannot care for himself.  I can't give him the round the clock care he needs because I have to also care for my mother who has dementia."  She feels strongly that someone will need to speak with her father about [place] going forward.  I told her we have Pall consult in place for him.

## 2020-01-12 NOTE — Consult Note (Signed)
Jonathon Bellows , MD [address] [place], [place], [place], [address] [id number], [place], [place], [place], [address] Phone: [phone]  Fax: [phone]  Consultation  Referring Provider:  Dr [name]     Primary Care Physician:  Olin Hauser, DO Primary Gastroenterologist: None          Reason for Consultation:     Vomiting   Date of Admission:  12/24/2019 Date of Consultation:  01/12/2020         HPI:   [name] is a 79 y.o. male with a history of coronary artery disease, CABG, bradycardia, permanent pacemaker placement.  History of type 2 diabetes, hypertension, CKD.  Admitted for limb ulcer infection.  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  Presently being treated for decompensated congestive heart failure.  Severe tricuspid regurgitation and moderate mitral regurgitation.  Acute on chronic renal failure and severe protein calorie malnutrition.  Severe hyperglycemia.  I have been consulted for nausea and vomiting. X-ray of the abdomen demonstrates increased gaseous distention of the stomach.  Nonobstructive bowel gas pattern.Blood sugars have been over 150 mg/dL.  Hemoglobin 8.1 g.  MCV 83.5.  When I went into see him , he kept repeating to leave him alone, denies any pain or distress , able to lay flat  Past Medical History:  Diagnosis Date  . Carcinoma of skin   . Cauda equina compression ([place])   . Diabetic retinopathy ([place])   . GERD (gastroesophageal reflux disease)   . Hypertension     Past Surgical History:  Procedure Laterality Date  . AMPUTATION Left 12/20/2019   Procedure: LEFT BELOW KNEE AMPUTATION;  Surgeon: Algernon Huxley, MD;  Location: [facility] ORS;  Service: General;  Laterality: Left;  . BACK SURGERY  07/2012  . CARDIAC CATHETERIZATION  2005  . CATARACT EXTRACTION  2014   right eye   . CORONARY ARTERY BYPASS GRAFT  2005    CABG x 7 at  [place]     left knee  . LOWER EXTREMITY ANGIOGRAPHY Left 01/08/2020   Procedure: Lower Extremity Angiography;  Surgeon: Algernon Huxley, MD;  Location: [facility];  Service: Cardiovascular;  Laterality: Left;    Prior to Admission medications   Medication Sig Start Date End Date Taking? Authorizing Provider  amLODipine (NORVASC) 10 MG tablet Take 1 tablet (10 mg total) by mouth daily. 05/25/16  Yes [name], MD  aspirin EC 81 MG tablet Take 81 mg by mouth daily.   Yes [provider]  atorvastatin (LIPITOR) 80 MG tablet Take 40 mg by mouth at bedtime.   Yes [provider]  calcium acetate (PHOSLO) 667 MG capsule Take 667 mg by mouth 2 (two) times daily with a meal.   Yes [provider]  glipiZIDE (GLUCOTROL) 5 MG tablet Take 2.5 mg by mouth 2 (two) times daily before a meal.   Yes [provider]  Insulin Glargine (LANTUS) 100 UNIT/ML Solostar Pen Inject 18 Units into the skin daily.    Yes [provider]  omeprazole (PRILOSEC) 20 MG capsule Take 1 capsule (20 mg total) by mouth daily. 04/12/16  Yes [name], MD  tamsulosin (FLOMAX) 0.4 MG CAPS capsule Take 1 capsule (0.4 mg total) by mouth daily. 08/01/15  Yes [name], MD  acetaminophen (TYLENOL) 500 MG tablet Take 1,000 mg  by mouth every 6 (six) hours as needed for mild pain, fever or headache.    [provider]  fluticasone (FLONASE) 50 MCG/ACT nasal spray Place 2 sprays into both nostrils daily. Use for 4-6 weeks then stop and use seasonally or as needed. 02/13/18   Karamalegos, [name], DO  loratadine (CLARITIN) 10 MG tablet Take 1 tablet (10 mg total) by mouth daily. Use for 4-6 weeks then stop, and use as needed or seasonally 02/13/18   Olin Hauser, DO    Family History  Problem Relation Age of Onset  . Hypertension Sister   . Hyperlipidemia Sister      Social History   Tobacco Use  . Smoking  status: Former Smoker    Years: 45.00    Types: Pipe    Quit date: 2016    Years since quitting: 5.5  . Smokeless tobacco: Former Network engineer Use Topics  . Alcohol use: Yes    Alcohol/week: 0.0 standard drinks    Comment: occasional.  . Drug use: No    Allergies as of 01/10/2020 - Review Complete 01/06/2020  Allergen Reaction Noted  . Fluorescein  06/02/2011  . Ivp dye [iodinated diagnostic agents] Other (See Comments) 04/06/2013  . Morphine Nausea Only 06/02/2011  . Other  02/13/2018  . Codeine Nausea And Vomiting and Rash 02/11/2015    Review of Systems:    All systems reviewed and negative except where noted in HPI.   Physical Exam:  Vital signs in last 24 hours: Temp:  [97.6 F (36.4 C)-98.1 F (36.7 C)] 97.9 F (36.6 C) (07/24 0300) Pulse Rate:  [84-104] 95 (07/24 0600) Resp:  [16-28] 20 (07/24 0600) BP: (83-120)/(45-67) 85/46 (07/24 0600) SpO2:  [79 %-100 %] 86 % (07/24 0600) Weight:  [95.9 kg] 95.9 kg (07/23 1800) Last BM Date: 01/11/20 General:   Pleasant, cooperative in NAD Head:  Normocephalic and atraumatic. Eyes:   No icterus.   Conjunctiva pink. PERRLA. Ears:  Normal auditory acuity. Neck:  Supple; no masses or thyroidomegaly Lungs: Respirations even and unlabored. Lungs clear to auscultation bilaterally.   No wheezes, crackles, or rhonchi.  Heart:  Regular rate and rhythm;   Abdomen:  Soft, nondistended, nontender. Normal bowel sounds. No appreciable masses or hepatomegaly.  No rebound or guarding.  Neurologic:  Alert and oriented x3;  Psych:  Alert and cooperative. Normal affect.  LAB RESULTS: Recent Labs    01/11/20 0443 01/12/20 0529  WBC 14.4* 13.1*  HGB 8.3* 8.1*  HCT 26.7* 24.8*  PLT 550* 440*   BMET Recent Labs    01/10/20 2146 01/11/20 0443 01/12/20 0529  NA 125* 127* 128*  K 5.4* 5.9* 5.0  CL 94* 92* 94*  CO2 20* 20* 18*  GLUCOSE 239* 296* 159*  BUN 98* 98* 99*  CREATININE 3.43* 3.55* 3.83*  CALCIUM 7.8* 8.0* 8.2*    LFT Recent Labs    01/11/20 0443  PROT 6.2*  ALBUMIN 2.5*  AST 158*  ALT 99*  ALKPHOS 239*  BILITOT 1.3*   PT/INR No results for input(s): LABPROT, INR in the last 72 hours.  STUDIES: US RENAL  Result Date: 01/10/2020 CLINICAL DATA:  Acute kidney failure. EXAM: RENAL / URINARY TRACT ULTRASOUND COMPLETE COMPARISON:  Renal ultrasound 07/30/2015 FINDINGS: Right Kidney: Renal measurements: 12.1 x 5.9 x 6.3 cm = volume: 238 mL . Echogenicity within normal limits. There is a cyst in the superior pole measuring 1.1 x 1.0 x 1.0 cm. There is another cyst in the  inferior pole measuring 2.3 x 2.1 x 2.4 cm. Left Kidney: Renal measurements: 12.1 x 5.6 x 6.5 cm = volume: 230 mL. Echogenicity within normal limits. No mass or hydronephrosis visualized. Bladder: Not visualized.  Patient has suprapubic catheter. Other: None. IMPRESSION: No acute finding sonographically in the bilateral kidneys. Electronically Signed   By: Audie Pinto M.D.   On: 01/10/2020 12:46   ECHOCARDIOGRAM COMPLETE  Result Date: 01/11/2020    ECHOCARDIOGRAM REPORT   Patient Name:   [name] Date of Exam: 01/10/2020 Medical Rec #:  [id number]      Height:       74.0 in Accession #:    [id number]     Weight:       167.0 lb Date of Birth:  [birth year]      BSA:          2.013 m Patient Age:    40 years       BP:           108/79 mmHg Patient Gender: M              HR:           88 bpm. Exam Location:  [facility] Procedure: 2D Echo, Cardiac Doppler and Color Doppler Indications:     CHF-Acute Systolic 710.62 / I94.85  History:         Patient has no prior history of Echocardiogram examinations.                  Risk Factors:Hypertension.  Sonographer:     Alyse Low Roar Referring Phys:  [id number] [facility] Diagnosing Phys: Bartholome Bill MD IMPRESSIONS  1. Left ventricular ejection fraction, by estimation, is 30 to 35%. The left ventricle has moderately decreased function. The left ventricle demonstrates global hypokinesis. The left  ventricular internal cavity size was mildly dilated. Left ventricular diastolic parameters were normal.  2. Right ventricular systolic function was not well visualized. The right ventricular size is mildly enlarged. There is moderately elevated pulmonary artery systolic pressure.  3. Left atrial size was mildly dilated.  4. The mitral valve is grossly normal. Mild mitral valve regurgitation.  5. Tricuspid valve regurgitation is mild to moderate.  6. The aortic valve is grossly normal. Aortic valve regurgitation is trivial. FINDINGS  Left Ventricle: Left ventricular ejection fraction, by estimation, is 30 to 35%. The left ventricle has moderately decreased function. The left ventricle demonstrates global hypokinesis. The left ventricular internal cavity size was mildly dilated. There is no left ventricular hypertrophy. Left ventricular diastolic parameters were normal. Right Ventricle: The right ventricular size is mildly enlarged. No increase in right ventricular wall thickness. Right ventricular systolic function was not well visualized. There is moderately elevated pulmonary artery systolic pressure. The tricuspid regurgitant velocity is 3.08 m/s, and with an assumed right atrial pressure of 10 mmHg, the estimated right ventricular systolic pressure is 50.0 mmHg. Left Atrium: Left atrial size was mildly dilated. Right Atrium: Right atrial size was not well visualized. Pericardium: There is no evidence of pericardial effusion. Mitral Valve: The mitral valve is grossly normal. Mild mitral valve regurgitation. Tricuspid Valve: The tricuspid valve is not well visualized. Tricuspid valve regurgitation is mild to moderate. Aortic Valve: The aortic valve is grossly normal. Aortic valve regurgitation is trivial. Aortic valve mean gradient measures 2.0 mmHg. Aortic valve peak gradient measures 3.1 mmHg. Aortic valve area, by VTI measures 1.94 cm. Pulmonic Valve: The pulmonic valve was not well visualized. Pulmonic valve  regurgitation  is trivial. Aorta: The aortic root is normal in size and structure. IAS/Shunts: The interatrial septum was not assessed.  LEFT VENTRICLE PLAX 2D LVIDd:         5.49 cm  Diastology LVIDs:         4.80 cm  LV e' lateral:   6.20 cm/s LV PW:         1.27 cm  LV E/e' lateral: 23.5 LV IVS:        1.20 cm  LV e' medial:    5.11 cm/s LVOT diam:     2.00 cm  LV E/e' medial:  28.6 LV SV:         28 LV SV Index:   14 LVOT Area:     3.14 cm  RIGHT VENTRICLE RV S prime:     5.87 cm/s LEFT ATRIUM             Index       RIGHT ATRIUM           Index LA diam:        4.70 cm 2.33 cm/m  RA Area:     25.90 cm LA Vol (A2C):   80.3 ml 39.89 ml/m RA Volume:   90.50 ml  44.95 ml/m LA Vol (A4C):   84.6 ml 42.02 ml/m LA Biplane Vol: 85.4 ml 42.42 ml/m  AORTIC VALVE                   PULMONIC VALVE AV Area (Vmax):    2.01 cm    PV Vmax:        0.65 m/s AV Area (Vmean):   1.67 cm    PV Peak grad:   1.7 mmHg AV Area (VTI):     1.94 cm    RVOT Peak grad: 1 mmHg AV Vmax:           87.50 cm/s AV Vmean:          65.600 cm/s AV VTI:            0.142 m AV Peak Grad:      3.1 mmHg AV Mean Grad:      2.0 mmHg LVOT Vmax:         55.90 cm/s LVOT Vmean:        34.900 cm/s LVOT VTI:          0.088 m LVOT/AV VTI ratio: 0.62  AORTA Ao Root diam: 3.00 cm MITRAL VALVE                TRICUSPID VALVE MV Area (PHT): 5.31 cm     TR Peak grad:   37.9 mmHg MV Decel Time: 143 msec     TR Vmax:        308.00 cm/s MV E velocity: 146.00 cm/s                             SHUNTS                             Systemic VTI:  0.09 m                             Systemic Diam: 2.00 cm Bartholome Bill MD Electronically signed by Bartholome Bill MD Signature Date/Time: 01/11/2020/8:57:24 AM    Final       Impression /  Plan:   [name] is a 79 y.o. y/o male with with significant coronary artery disease, congestive heart failure, CKD.  Recently underwent a below-knee left limb amputation.  Admitted with urosepsis being treated with antibiotics developed  acute on chronic kidney failure, metabolic encephalopathy.  Elevated blood sugars, low electrolytes.  I have been consulted for vomiting.  X-ray of the abdomen shows gaseous distention of the stomach but no clear bowel obstruction.  Impression: Nausea vomiting likely multifactorial from abnormal electrolytes, gastroparesis from elevated blood sugars, in addition heart failure can cause gut edema and lead to nausea.Low BP  Plan  1.  NG tube with decompression of the stomach if nausea/vomiting persists.  Once there is no residuals.  Consider starting on a feed preferably [place] via the tube at the lower rate if blood pressure permits .  Check periodically for residuals.  He may need to be started at the lower rate than usual due to possible gastroparesis.  If okay from a cardiology point of view can consider Reglan.  2.  Normalize electrolytes, avoid narcotics and continue management of heart failure.  3.  At this time I do not see an indication to perform an EGD as it would be very high risk to put him through anesthesia.  If we noticed very high residuals could consider an upper GI series when he is more stable to be transferred to imaging. Would recommend palliative care to see him to discuss goals of care   Thank you for involving me in the care of this patient.      LOS: 43 days   Jonathon Bellows, MD  01/12/2020, 10:24 AM

## 2020-01-13 ENCOUNTER — Inpatient Hospital Stay: Payer: No Typology Code available for payment source

## 2020-01-13 ENCOUNTER — Inpatient Hospital Stay (HOSPITAL_COMMUNITY): Payer: No Typology Code available for payment source

## 2020-01-13 LAB — COMPREHENSIVE METABOLIC PANEL
ALT: 60 U/L — ABNORMAL HIGH (ref 0–44)
AST: 50 U/L — ABNORMAL HIGH (ref 15–41)
Albumin: 3.3 g/dL — ABNORMAL LOW (ref 3.5–5.0)
Alkaline Phosphatase: 162 U/L — ABNORMAL HIGH (ref 38–126)
Anion gap: 15 (ref 5–15)
BUN: 112 mg/dL — ABNORMAL HIGH (ref 8–23)
CO2: 18 mmol/L — ABNORMAL LOW (ref 22–32)
Calcium: 8.1 mg/dL — ABNORMAL LOW (ref 8.9–10.3)
Chloride: 93 mmol/L — ABNORMAL LOW (ref 98–111)
Creatinine, Ser: 4.19 mg/dL — ABNORMAL HIGH (ref 0.61–1.24)
GFR calc Af Amer: 14 mL/min — ABNORMAL LOW (ref >60)
GFR calc non Af Amer: 12 mL/min — ABNORMAL LOW (ref >60)
Glucose, Bld: 150 mg/dL — ABNORMAL HIGH (ref 70–99)
Potassium: 5.2 mmol/L — ABNORMAL HIGH (ref 3.5–5.1)
Sodium: 126 mmol/L — ABNORMAL LOW (ref 135–145)
Total Bilirubin: 1.5 mg/dL — ABNORMAL HIGH (ref 0.3–1.2)
Total Protein: 5.9 g/dL — ABNORMAL LOW (ref 6.5–8.1)

## 2020-01-13 LAB — CBC WITH DIFFERENTIAL/PLATELET
Abs Immature Granulocytes: 0.15 10*3/uL — ABNORMAL HIGH (ref 0.00–0.07)
Basophils Absolute: 0 10*3/uL (ref 0.0–0.1)
Basophils Relative: 0 %
Eosinophils Absolute: 0 10*3/uL (ref 0.0–0.5)
Eosinophils Relative: 0 %
HCT: 24.9 % — ABNORMAL LOW (ref 39.0–52.0)
Hemoglobin: 8.3 g/dL — ABNORMAL LOW (ref 13.0–17.0)
Immature Granulocytes: 1 %
Lymphocytes Relative: 6 %
Lymphs Abs: 0.7 10*3/uL (ref 0.7–4.0)
MCH: 27.7 pg (ref 26.0–34.0)
MCHC: 33.3 g/dL (ref 30.0–36.0)
MCV: 83 fL (ref 80.0–100.0)
Monocytes Absolute: 0.9 10*3/uL (ref 0.1–1.0)
Monocytes Relative: 8 %
Neutro Abs: 9.2 10*3/uL — ABNORMAL HIGH (ref 1.7–7.7)
Neutrophils Relative %: 85 %
Platelets: 413 10*3/uL — ABNORMAL HIGH (ref 150–400)
RBC: 3 MIL/uL — ABNORMAL LOW (ref 4.22–5.81)
RDW: 21.9 % — ABNORMAL HIGH (ref 11.5–15.5)
Smear Review: NORMAL
WBC: 10.6 10*3/uL — ABNORMAL HIGH (ref 4.0–10.5)
nRBC: 0 % (ref 0.0–0.2)

## 2020-01-13 LAB — BLOOD GAS, ARTERIAL
Acid-base deficit: 10.6 mmol/L — ABNORMAL HIGH (ref 0.0–2.0)
Bicarbonate: 15.2 mmol/L — ABNORMAL LOW (ref 20.0–28.0)
FIO2: 0.35
MECHVT: 500 mL
O2 Saturation: 94.3 %
PEEP: 5 cmH2O
Patient temperature: 37
RATE: 16 resp/min
pCO2 arterial: 33 mmHg (ref 32.0–48.0)
pH, Arterial: 7.27 — ABNORMAL LOW (ref 7.350–7.450)
pO2, Arterial: 82 mmHg — ABNORMAL LOW (ref 83.0–108.0)

## 2020-01-13 LAB — PHOSPHORUS: Phosphorus: 6.8 mg/dL — ABNORMAL HIGH (ref 2.5–4.6)

## 2020-01-13 LAB — GLUCOSE, CAPILLARY
Glucose-Capillary: 140 mg/dL — ABNORMAL HIGH (ref 70–99)
Glucose-Capillary: 161 mg/dL — ABNORMAL HIGH (ref 70–99)
Glucose-Capillary: 168 mg/dL — ABNORMAL HIGH (ref 70–99)
Glucose-Capillary: 126 mg/dL — ABNORMAL HIGH (ref 70–99)

## 2020-01-13 LAB — MAGNESIUM: Magnesium: 2.4 mg/dL (ref 1.7–2.4)

## 2020-01-13 MED ORDER — PRISMASOL BGK 4/2.5 32-4-2.5 MEQ/L REPLACEMENT SOLN
Status: DC
  Filled 2020-01-13: qty 5000

## 2020-01-13 MED ORDER — FENTANYL CITRATE (PF) 100 MCG/2ML IJ SOLN
100.0000 ug | Freq: Once | INTRAMUSCULAR | Status: AC
  Filled 2020-01-13: qty 2

## 2020-01-13 MED ORDER — MIDAZOLAM HCL 2 MG/2ML IJ SOLN: 2.0000 mg | Freq: Once | INTRAMUSCULAR | Status: AC

## 2020-01-13 MED ORDER — FENTANYL 2500MCG IN NS 250ML (10MCG/ML) PREMIX INFUSION
0.0000 ug/h | INTRAVENOUS | Status: DC
  Administered 2020-01-14: 75 ug/h via INTRAVENOUS
  Administered 2020-01-15: 100 ug/h via INTRAVENOUS
  Administered 2020-01-16: 200 ug/h via INTRAVENOUS
  Administered 2020-01-16 (×2): 300 ug/h via INTRAVENOUS
  Administered 2020-01-17: 200 ug/h via INTRAVENOUS
  Filled 2020-01-13 (×6): qty 250

## 2020-01-13 MED ORDER — PRISMASOL BGK 4/2.5 32-4-2.5 MEQ/L IV SOLN
INTRAVENOUS | Status: DC
  Filled 2020-01-13: qty 5000

## 2020-01-13 MED ORDER — ROCURONIUM BROMIDE 50 MG/5ML IV SOLN: 40.0000 mg | Freq: Once | INTRAVENOUS | Status: AC

## 2020-01-13 MED ORDER — HEPARIN SODIUM (PORCINE) 1000 UNIT/ML DIALYSIS
1000.0000 [IU] | INTRAMUSCULAR | Status: DC | PRN
  Administered 2020-01-15: 2800 [IU] via INTRAVENOUS_CENTRAL
  Filled 2020-01-13: qty 6
  Filled 2020-01-13: qty 3
  Filled 2020-01-13: qty 4
  Filled 2020-01-13 (×2): qty 6

## 2020-01-13 MED ORDER — NOREPINEPHRINE 4 MG/250ML-% IV SOLN
0.0000 ug/min | INTRAVENOUS | Status: DC
  Administered 2020-01-13: 5 ug/min via INTRAVENOUS
  Administered 2020-01-14: 6 ug/min via INTRAVENOUS
  Administered 2020-01-14 – 2020-01-15 (×2): 5 ug/min via INTRAVENOUS
  Administered 2020-01-15: 4 ug/min via INTRAVENOUS
  Administered 2020-01-16: 7 ug/min via INTRAVENOUS
  Filled 2020-01-13 (×7): qty 250

## 2020-01-13 MED ORDER — ROCURONIUM BROMIDE 50 MG/5ML IV SOLN
INTRAVENOUS | Status: AC
  Administered 2020-01-13: 50 mg via INTRAVENOUS
  Filled 2020-01-13: qty 1

## 2020-01-13 MED ORDER — FENTANYL 2500MCG IN NS 250ML (10MCG/ML) PREMIX INFUSION
INTRAVENOUS | Status: AC
  Administered 2020-01-13: 150 ug/h via INTRAVENOUS
  Filled 2020-01-13: qty 250

## 2020-01-13 MED ORDER — FENTANYL 2500MCG IN NS 250ML (10MCG/ML) PREMIX INFUSION: 0.0000 ug/h | INTRAVENOUS | Status: DC

## 2020-01-13 MED ORDER — MIDAZOLAM HCL 2 MG/2ML IJ SOLN
INTRAMUSCULAR | Status: AC
  Administered 2020-01-13: 2 mg via INTRAVENOUS
  Filled 2020-01-13: qty 2

## 2020-01-13 MED ORDER — FENTANYL CITRATE (PF) 100 MCG/2ML IJ SOLN
INTRAMUSCULAR | Status: AC
  Administered 2020-01-13: 100 ug via INTRAVENOUS
  Filled 2020-01-13: qty 2

## 2020-01-13 MED ORDER — DEXMEDETOMIDINE HCL IN NACL 400 MCG/100ML IV SOLN
0.4000 ug/kg/h | INTRAVENOUS | Status: DC
  Administered 2020-01-13 (×2): 0.8 ug/kg/h via INTRAVENOUS
  Administered 2020-01-14 (×2): 0.7 ug/kg/h via INTRAVENOUS
  Administered 2020-01-15: 0.8 ug/kg/h via INTRAVENOUS
  Administered 2020-01-15: 1.4 ug/kg/h via INTRAVENOUS
  Administered 2020-01-15: 0.6 ug/kg/h via INTRAVENOUS
  Administered 2020-01-15: 1.4 ug/kg/h via INTRAVENOUS
  Administered 2020-01-15: 0.6 ug/kg/h via INTRAVENOUS
  Administered 2020-01-15: 0.8 ug/kg/h via INTRAVENOUS
  Filled 2020-01-13 (×11): qty 100

## 2020-01-13 NOTE — Progress Notes (Signed)
Progress Note  Patient Name: [name] Date of Encounter: 01/13/2020  Primary Cardiologist: [place] spoke with step daughter this am Apparently nurse unable to place NG tube  Inpatient Medications    Scheduled Meds: . aspirin EC  81 mg Oral Daily  . Chlorhexidine Gluconate Cloth  6 each Topical Daily  . docusate sodium  100 mg Oral BID  . feeding supplement (NEPRO CARB STEADY)  237 mL Oral TID BM  . ferrous sulfate  325 mg Oral Q breakfast  . fluticasone  2 spray Each Nare Daily  . gabapentin  100 mg Oral QHS  . heparin  5,000 Units Subcutaneous Q8H  . insulin aspart  0-15 Units Subcutaneous TID WC  . insulin glargine  10 Units Subcutaneous QHS  . lidocaine  1 patch Transdermal Q24H  . loratadine  10 mg Oral Daily  . metoCLOPramide (REGLAN) injection  10 mg Intravenous QID  . multivitamin with minerals  1 tablet Oral Daily  . pantoprazole (PROTONIX) IV  40 mg Intravenous BID  . patiromer  16.8 g Oral Daily  . polyethylene glycol  17 g Oral Daily  . scopolamine  1 patch Transdermal Q72H  . simethicone  80 mg Oral QID  . simvastatin  10 mg Oral QHS  . sodium chloride flush  3 mL Intravenous Once  . tamsulosin  0.4 mg Oral Daily  . vitamin B-12  1,000 mcg Oral Daily   Continuous Infusions: . sodium chloride 250 mL (01/05/20 0616)  . albumin human 25 g (01/13/20 0942)  . furosemide (LASIX) infusion 8 mg/hr (01/13/20 0533)  . milrinone 0.3 mcg/kg/min (01/13/20 0533)   PRN Meds: sodium chloride, acetaminophen **OR** acetaminophen, alum & mag hydroxide-simeth, calcium carbonate, guaiFENesin-dextromethorphan, ondansetron **OR** ondansetron (ZOFRAN) IV, oxyCODONE, polyvinyl alcohol, promethazine, sodium phosphate   Vital Signs    Vitals:   01/13/20 0342 01/13/20 0400 01/13/20 0500 01/13/20 0600  BP: 111/78 94/76 96/73  112/70  Pulse: 94 94 93 94  Resp: 20 17 21 20   Temp: 98.5 F (36.9 C)     TempSrc: Axillary     SpO2: 99% 98% 96%  94%  Weight:      Height:        Intake/Output Summary (Last 24 hours) at 01/13/2020 1035 Last data filed at 01/13/2020 0533 Gross per 24 hour  Intake 790.3 ml  Output 255 ml  Net 535.3 ml   Filed Weights   01/15/2020 1533 01/11/20 1800  Weight: 75.8 kg (!) 95.9 kg    Telemetry    Paced rhythm - 90-100 bpm  ECG    No new tracings - Personally Reviewed  Physical Exam   Chronically ill pale white male Abdomen tympanitic with mild tenderness Basilar atelectasis  Post CABG with SEM  PPM under left clavicle  Post Left BKA   Labs    Chemistry Recent Labs  Lab 01/11/20 0443 01/12/20 0529 01/13/20 0723  NA 127* 128* 126*  K 5.9* 5.0 5.2*  CL 92* 94* 93*  CO2 20* 18* 18*  GLUCOSE 296* 159* 150*  BUN 98* 99* 112*  CREATININE 3.55* 3.83* 4.19*  CALCIUM 8.0* 8.2* 8.1*  PROT 6.2*  --  5.9*  ALBUMIN 2.5*  --  3.3*  AST 158*  --  50*  ALT 99*  --  60*  ALKPHOS 239*  --  162*  BILITOT 1.3*  --  1.5*  GFRNONAA 15* 14* 12*  GFRAA 18* 16* 14*  ANIONGAP 15 16* 15     Hematology Recent Labs  Lab 01/11/20 0443 01/12/20 0529 01/13/20 0723  WBC 14.4* 13.1* 10.6*  RBC 3.14* 2.97* 3.00*  HGB 8.3* 8.1* 8.3*  HCT 26.7* 24.8* 24.9*  MCV 85.0 83.5 83.0  MCH 26.4 27.3 27.7  MCHC 31.1 32.7 33.3  RDW 20.5* 21.6* 21.9*  PLT 550* 440* 413*    Cardiac EnzymesNo results for input(s): TROPONINI in the last 168 hours. No results for input(s): TROPIPOC in the last 168 hours.   BNPNo results for input(s): BNP, PROBNP in the last 168 hours.   DDimer No results for input(s): DDIMER in the last 168 hours.   Radiology    US RENAL  Result Date: 01/10/2020 IMPRESSION: No acute finding sonographically in the bilateral kidneys. Electronically Signed   By: Audie Pinto M.D.   On: 01/10/2020 12:46    Cardiac Studies   2D echo 01/10/2020: 1. Left ventricular ejection fraction, by estimation, is 30 to 35%. The  left ventricle has moderately decreased function. The left  ventricle  demonstrates global hypokinesis. The left ventricular internal cavity size  was mildly dilated. Left ventricular  diastolic parameters were normal.   2. Right ventricular systolic function was not well visualized. The right  ventricular size is mildly enlarged. There is moderately elevated  pulmonary artery systolic pressure.   3. Left atrial size was mildly dilated.   4. The mitral valve is grossly normal. Mild mitral valve regurgitation.   5. Tricuspid valve regurgitation is mild to moderate.   6. The aortic valve is grossly normal. Aortic valve regurgitation is  trivial.   Patient Profile     79 y.o. male with history of CAD s/p CABG x 7 in 2005, reported symptomatic bradycardia s/p PPM earlier this year at the [place], CKD stage III, PAD, IDDM, anemia of chronic disease, HTN, HLD, BPH/urinary retention who is being seen today for the evaluation of cardiomyopathy.  Assessment & Plan    1. HFrEF/cardiorenal syndrome: -Chronicity uncertain -Presumably ischemic in etiology given the patient's prior history of cardiac bypass surgery -Echo this admission shows an EF of 20 to 25% by [facility] read with severe TR and elevated right heart pressures -He is significantly volume overloaded -Transferred to the ICU on 7/23 and placed on milrinone gtt to assist with augmentation of diuresis  - Cr continues to rise over 4 this am Step daughter indicated he did not want dialysis  - Palliative care consult pending    2.  CAD status post CABG: -He denies any symptoms of angina -ASA  3.  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  Acute on CKD stage III with hyperkalemia: -Likely ATN exacerbated by cardiorenal syndrome -Renal function continues to decline  -Nephrology on board -Milrinone as  outlined above   6.  HTN: -BP soft -Agree with holding PTA amlodipine in the setting of relative hypotension and cardiomyopathy   7.  HLD: -PTA statin  8. Nausea/vomiting :  Unable to decompress with NG tube KUB pending reglan and scopolamine, protonix per GI   Overall, patient is critically ill. Palliative care has been consulted to discuss [place].  For questions or updates, please contact [place] Please consult [email or web address] for contact info under Cardiology/STEMI.    Jenkins Rouge MD [facility]

## 2020-01-13 NOTE — Progress Notes (Signed)
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 Patient was started on IV vancomycin, Rocephin and Flagyl, podiatry and vascular surgeon were consulted. Patient had a angiogram and revascularization on 7/12. But the foot gangrene does not seem to be improving, as a result, planning for below-knee amputation. 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 Cardiology saw patient and recommendation for milrinone drip with diuresis. PCCM consult for further evaluation and management.    01/13/20 - patient has worsening abdominal distension and he had few more episodes of bilious vomitus, he is altered with encephalopathy and is clinically declining. I met with wife and daughter today to have goals of care discussion.    Goals of Care:   Family brought in written advanced directive. Patient wishes to be full code and an assigned POA is named (daughter [name]) in case he is unable to make decisions. [name] is present during [place] discussion.  We reviewed GI recommendation for NGT decompression and nephrology recommendation for dialysis and cardiology favor CVP trending while on milrinone gtt.  They whish to proceed  with these procedures.  Additionally we discussed continued vomiting in context of lethargy with shock and they do wish for patient to be intubated for airway protection. They do understand that patient is overall with poor prognosis and wish to attept at helping him through this but will not wish for prolonged aggressive care if despite full scope of therapy patient does not improve.   Lines/tubes : Suprapubic Catheter (Active)  Site Assessment Clean 01/11/20 0851  Dressing Status None 01/11/20 0851  Dressing Type Other (Comment) 01/09/20 2001  Collection Container Leg bag 01/11/20 0851  Securement Method Ties;Securement Device 01/11/20 0851  Output (mL) 225 mL 01/11/20 1429    Microbiology/Sepsis markers: Results for orders placed or performed during the hospital encounter of 12/27/2019  Blood Cultures x 2 sites     Status: None   Collection Time: 01/14/2020  8:48 PM   Specimen: BLOOD  Result Value Ref Range Status   Specimen Description BLOOD BLOOD LEFT FOREARM  Final   Special Requests   Final    BOTTLES DRAWN AEROBIC AND ANAEROBIC Blood Culture adequate volume   Culture   Final    NO GROWTH 5 DAYS Performed at [facility], [address] [place], [place] [address]    Report Status 12/20/2019 FINAL  Final  Culture, blood (Routine X 2) w Reflex to ID Panel     Status: None   Collection Time: 12/27/19  7:01 PM   Specimen: BLOOD  Result Value Ref Range Status   Specimen Description BLOOD BLOOD RIGHT HAND  Final   Special Requests   Final    BOTTLES DRAWN AEROBIC AND ANAEROBIC Blood Culture adequate volume   Culture   Final    NO GROWTH 5 DAYS Performed at [facility], [address], [place], [place] [address]    Report Status 12/23/2019 FINAL  Final  SARS Coronavirus 2 by RT  PCR (hospital order, performed in [facility] hospital lab) Nasopharyngeal Nasopharyngeal Swab     Status: None   Collection Time: 12/27/19  8:45 PM   Specimen: Nasopharyngeal Swab    Result Value Ref Range Status   SARS Coronavirus 2 NEGATIVE NEGATIVE Final    Comment: (NOTE) SARS-CoV-2 target nucleic acids are NOT DETECTED.  The SARS-CoV-2 RNA is generally detectable in upper and lower respiratory specimens during the acute phase of infection. The lowest concentration of SARS-CoV-2 viral copies this assay can detect is 250 copies / mL. A negative result does not preclude SARS-CoV-2 infection and should not be used as the sole basis for treatment or other patient management decisions.  A negative result may occur with improper specimen collection / handling, submission of specimen other than nasopharyngeal swab, presence of viral mutation(s) within the areas targeted by this assay, and inadequate number of viral copies (<250 copies / mL). A negative result must be combined with clinical observations, patient history, and epidemiological information.  Fact Sheet for Patients:   StrictlyIdeas.no  Fact Sheet for Healthcare Providers: BankingDealers.co.za  This test is not yet approved or  cleared by the [place] FDA and has been authorized for detection and/or diagnosis of SARS-CoV-2 by FDA under an Emergency Use Authorization (EUA).  This EUA will remain in effect (meaning this test can be used) for the duration of the COVID-19 declaration under Section 564(b)(1) of the Act, 21 U.S.C. section 360bbb-3(b)(1), unless the authorization is terminated or revoked sooner.  Performed at [facility], [address] [place], [place] [address]   MRSA PCR Screening     Status: None   Collection Time: 12/27/19  8:45 PM   Specimen: Nasopharyngeal  Result Value Ref Range Status   MRSA by PCR NEGATIVE NEGATIVE Final    Comment:        The GeneXpert MRSA Assay (FDA approved for NASAL specimens only), is one component of a comprehensive MRSA colonization surveillance program. It is not intended to diagnose  MRSA infection nor to guide or monitor treatment for MRSA infections. Performed at [facility], [address] [place], [place] [address]   Urine Culture     Status: Abnormal   Collection Time: 01/08/20 10:45 AM   Specimen: Urine, Random  Result Value Ref Range Status   Specimen Description   Final    URINE, RANDOM Performed at [facility], [address] [place], [place] [address]    Special Requests   Final    NONE Performed at [facility], [address] [place], [place] [address]    Culture >=100,000 COLONIES/mL YEAST (A)  Final   Report Status 01/09/2020 FINAL  Final    Anti-infectives:  Anti-infectives (From admission, onward)   Start     Dose/Rate Route Frequency Ordered Stop   01/09/20 1600  fluconazole (DIFLUCAN) IVPB 100 mg  Status:  Discontinued        100 mg 50 mL/hr over 60 Minutes Intravenous Every 24 hours 01/09/20 1558 01/09/20 1601   01/08/20 1300  meropenem (MERREM) 1,000 mg in sodium chloride 0.9 % 100 mL IVPB  Status:  Discontinued        1,000 mg 200 mL/hr over 30 Minutes Intravenous Every 12 hours 01/08/20 1120 01/09/20 1140   01/06/20 2100  vancomycin (VANCOREADY) IVPB 750 mg/150 mL  Status:  Discontinued        750 mg 150 mL/hr over 60 Minutes Intravenous Every 24 hours 01/06/20 0614 01/06/20 0847  01/06/20 0800  Ampicillin-Sulbactam (UNASYN) 3 g in sodium chloride 0.9 % 100 mL IVPB  Status:  Discontinued        3 g 200 mL/hr over 30 Minutes Intravenous Every 12 hours 01/06/20 0625 01/06/20 0847   01/06/20 0613  Ampicillin-Sulbactam (UNASYN) 3 g in sodium chloride 0.9 % 100 mL IVPB  Status:  Discontinued        3 g 200 mL/hr over 30 Minutes Intravenous Every 12 hours 01/06/20 0614 01/06/20 0625   01/04/20 1800  Ampicillin-Sulbactam (UNASYN) 3 g in sodium chloride 0.9 % 100 mL IVPB  Status:  Discontinued        3 g 200 mL/hr over 30 Minutes Intravenous Every 12 hours 01/04/20 0912 01/06/20 0614   01/10/2020 1008   ceFAZolin (ANCEF) 2-4 GM/100ML-% IVPB       Note to Pharmacy: Leonia Reader   : cabinet override      01/06/2020 1008 01/16/2020 2214   12/24/2019 1007  levofloxacin (LEVAQUIN) 500 MG/100ML IVPB  Status:  Discontinued       Note to Pharmacy: Leonia Reader   : cabinet override      12/27/2019 1007 01/06/2020 1620   01/16/2020 1215  ceFAZolin (ANCEF) IVPB 1 g/50 mL premix        1 g 100 mL/hr over 30 Minutes Intravenous  Once 12/30/2019 1206 01/07/2020 1039   12/30/2019 1200  ceFAZolin (ANCEF) 1,000 mg in dextrose 5 % 100 mL IVPB  Status:  Discontinued        1,000 mg 220 mL/hr over 30 Minutes Intravenous  Once 01/02/2020 1159 12/29/2019 1205   01/13/2020 1000  ceFAZolin (ANCEF) IVPB 2g/100 mL premix  Status:  Discontinued        2 g 200 mL/hr over 30 Minutes Intravenous  Once 12/24/2019 0932 01/14/2020 1159   12/30/19 0830  metroNIDAZOLE (FLAGYL) IVPB 500 mg  Status:  Discontinued        500 mg 100 mL/hr over 60 Minutes Intravenous Every 8 hours 12/30/19 0822 01/04/20 0912   12/27/19 1900  vancomycin (VANCOCIN) IVPB 1000 mg/200 mL premix  Status:  Discontinued        1,000 mg 200 mL/hr over 60 Minutes Intravenous Every 24 hours 01/07/2020 2043 12/27/19 1217   12/27/19 1800  vancomycin (VANCOREADY) IVPB 750 mg/150 mL  Status:  Discontinued        750 mg 150 mL/hr over 60 Minutes Intravenous Every 24 hours 12/27/19 1217 01/06/20 0614   01/18/2020 2200  metroNIDAZOLE (FLAGYL) tablet 500 mg  Status:  Discontinued        500 mg Oral Every 8 hours 12/20/2019 2009 12/30/19 0822   12/25/2019 2045  vancomycin (VANCOREADY) IVPB 500 mg/100 mL        500 mg 100 mL/hr over 60 Minutes Intravenous  Once 01/09/2020 2040 12/27/19 0000   01/12/2020 2015  cefTRIAXone (ROCEPHIN) 2 g in sodium chloride 0.9 % 100 mL IVPB  Status:  Discontinued        2 g 200 mL/hr over 30 Minutes Intravenous Every 24 hours 12/24/2019 2009 01/04/20 0912   12/22/2019 1815  vancomycin (VANCOCIN) IVPB 1000 mg/200 mL premix        1,000 mg 200 mL/hr over 60 Minutes  Intravenous  Once 12/30/2019 1801 12/24/2019 2023   12/25/2019 1815  piperacillin-tazobactam (ZOSYN) IVPB 3.375 g        3.375 g 100 mL/hr over 30 Minutes Intravenous  Once 12/31/2019 1801 01/07/2020 1953  Consults: Treatment Team:  Minna Merritts, MD Lin Landsman, MD [facility], [facility], MD     PAST MEDICAL HISTORY   Past Medical History:  Diagnosis Date  . Carcinoma of skin   . Cauda equina compression ([place])   . Diabetic retinopathy ([place])   . GERD (gastroesophageal reflux disease)   . Hypertension      SURGICAL HISTORY   Past Surgical History:  Procedure Laterality Date  . AMPUTATION Left 01/06/2020   Procedure: LEFT BELOW KNEE AMPUTATION;  Surgeon: Algernon Huxley, MD;  Location: [facility] ORS;  Service: General;  Laterality: Left;  . BACK SURGERY  07/2012  . CARDIAC CATHETERIZATION  2005  . CATARACT EXTRACTION  2014   right eye   . CORONARY ARTERY BYPASS GRAFT  2005    CABG x 7 at [place]     left knee  . LOWER EXTREMITY ANGIOGRAPHY Left 01/19/2020   Procedure: Lower Extremity Angiography;  Surgeon: Algernon Huxley, MD;  Location: [facility];  Service: Cardiovascular;  Laterality: Left;     FAMILY HISTORY   Family History  Problem Relation Age of Onset  . Hypertension Sister   . Hyperlipidemia Sister      SOCIAL HISTORY   Social History   Tobacco Use  . Smoking status: Former Smoker    Years: 45.00    Types: Pipe    Quit date: 2016    Years since quitting: 5.5  . Smokeless tobacco: Former Network engineer Use Topics  . Alcohol use: Yes    Alcohol/week: 0.0 standard drinks    Comment: occasional.  . Drug use: No     MEDICATIONS   Current Medication:  Current Facility-Administered Medications:  .  0.9 %  sodium chloride infusion, , Intravenous, PRN, Sharen Hones, MD, Last Rate: 10 mL/hr at 01/05/20 0616, 250 mL at 01/05/20 0616 .  acetaminophen (TYLENOL) tablet 650 mg, 650 mg, Oral, Q6H PRN, 650 mg at 01/12/20  0827 **OR** acetaminophen (TYLENOL) suppository 650 mg, 650 mg, Rectal, Q6H PRN, Algernon Huxley, MD .  albumin human 25 % solution 25 g, 25 g, Intravenous, Q12H, Kolluru, Sarath, MD, Stopped at 01/13/20 0010 .  alum & mag hydroxide-simeth (MAALOX/MYLANTA) 200-200-20 MG/5ML suspension 15 mL, 15 mL, Oral, Q6H PRN, Algernon Huxley, MD, 15 mL at 01/04/20 2303 .  aspirin EC tablet 81 mg, 81 mg, Oral, Daily, Algernon Huxley, MD, 81 mg at 01/10/20 0837 .  calcium carbonate (TUMS - dosed in mg elemental calcium) chewable tablet 200 mg of elemental calcium, 1 tablet, Oral, TID PRN, Algernon Huxley, MD, 200 mg of elemental calcium at 01/12/20 0827 .  Chlorhexidine Gluconate Cloth 2 % PADS 6 each, 6 each, Topical, Daily, Dew, Erskine Squibb, MD, 6 each at 01/12/20 1008 .  docusate sodium (COLACE) capsule 100 mg, 100 mg, Oral, BID, Nolberto Hanlon, MD, 100 mg at 01/10/20 2222 .  feeding supplement (NEPRO CARB STEADY) liquid 237 mL, 237 mL, Oral, TID BM, Nolberto Hanlon, MD, 237 mL at 01/10/20 2038 .  ferrous sulfate tablet 325 mg, 325 mg, Oral, Q breakfast, Sharen Hones, MD, 325 mg at 01/11/20 9767 .  fluticasone (FLONASE) 50 MCG/ACT nasal spray 2 spray, 2 spray, Each Nare, Daily, Dew, Erskine Squibb, MD, 2 spray at 01/11/20 1445 .  furosemide (LASIX) 250 mg in dextrose 5 % 250 mL (1 mg/mL) infusion, 8 mg/hr, Intravenous, Continuous, Lateef, Munsoor, MD, Last Rate: 8 mL/hr at 01/13/20 0533, 8  mg/hr at 01/13/20 0533 .  gabapentin (NEURONTIN) capsule 100 mg, 100 mg, Oral, QHS, Dew, Erskine Squibb, MD, 100 mg at 01/12/20 2130 .  guaiFENesin-dextromethorphan (ROBITUSSIN DM) 100-10 MG/5ML syrup 10 mL, 10 mL, Oral, Q6H PRN, Algernon Huxley, MD .  heparin injection 5,000 Units, 5,000 Units, Subcutaneous, Q8H, Sharen Hones, MD, 5,000 Units at 01/13/20 0532 .  insulin aspart (novoLOG) injection 0-15 Units, 0-15 Units, Subcutaneous, TID WC, Algernon Huxley, MD, 2 Units at 01/12/20 1626 .  insulin glargine (LANTUS) injection 10 Units, 10 Units, Subcutaneous, QHS,  Ottie Glazier, MD, 10 Units at 01/12/20 2135 .  lidocaine (LIDODERM) 5 % 1 patch, 1 patch, Transdermal, Q24H, Dew, Erskine Squibb, MD, 1 patch at 01/12/20 1203 .  loratadine (CLARITIN) tablet 10 mg, 10 mg, Oral, Daily, Dew, Erskine Squibb, MD, 10 mg at 01/10/20 0837 .  metoCLOPramide (REGLAN) injection 10 mg, 10 mg, Intravenous, QID, [place] Gins, Petros Ahart, MD, 10 mg at 01/12/20 2130 .  milrinone (PRIMACOR) 20 MG/100 ML (0.2 mg/mL) infusion, 0.3 mcg/kg/min, Intravenous, Continuous, Amery, Sahar, MD, Last Rate: 6.82 mL/hr at 01/13/20 0533, 0.3 mcg/kg/min at 01/13/20 0533 .  multivitamin with minerals tablet 1 tablet, 1 tablet, Oral, Daily, Dew, Erskine Squibb, MD, 1 tablet at 01/10/20 [phone]  ondansetron (ZOFRAN) tablet 4 mg, 4 mg, Oral, Q6H PRN, 4 mg at 01/09/20 1508 **OR** ondansetron (ZOFRAN) injection 4 mg, 4 mg, Intravenous, Q6H PRN, Algernon Huxley, MD, 4 mg at 01/12/20 0827 .  oxyCODONE (Oxy IR/ROXICODONE) immediate release tablet 5 mg, 5 mg, Oral, Q6H PRN, Lang Snow, NP, 5 mg at 01/12/20 2146 .  pantoprazole (PROTONIX) injection 40 mg, 40 mg, Intravenous, BID, [place] Gins, Daanish Copes, MD, 40 mg at 01/12/20 2130 .  patiromer Daryll Drown) packet 16.8 g, 16.8 g, Oral, Daily, Kolluru, Sarath, MD .  polyethylene glycol (MIRALAX / GLYCOLAX) packet 17 g, 17 g, Oral, Daily, [name], Sahar, MD, 17 g at 01/10/20 1501 .  polyvinyl alcohol (LIQUIFILM TEARS) 1.4 % ophthalmic solution 2 drop, 2 drop, Both Eyes, PRN, Dew, Erskine Squibb, MD .  promethazine (PHENERGAN) injection 12.5 mg, 12.5 mg, Intravenous, Q6H PRN, Algernon Huxley, MD, 12.5 mg at 01/13/20 0119 .  scopolamine (TRANSDERM-SCOP) 1 MG/3DAYS 1.5 mg, 1 patch, Transdermal, Q72H, [place] Gins, Kazimierz Springborn, MD, 1.5 mg at 01/12/20 1648 .  simethicone (MYLICON) chewable tablet 80 mg, 80 mg, Oral, QID, Nolberto Hanlon, MD, 80 mg at 01/12/20 2135 .  simvastatin (ZOCOR) tablet 10 mg, 10 mg, Oral, QHS, Dew, Erskine Squibb, MD, 10 mg at 01/12/20 2130 .  sodium chloride flush (NS) 0.9 % injection 3 mL, 3 mL,  Intravenous, Once, Dew, Erskine Squibb, MD .  sodium phosphate (FLEET) 7-19 GM/118ML enema 1 enema, 1 enema, Rectal, Daily PRN, Algernon Huxley, MD, 1 enema at 01/11/20 1438 .  tamsulosin (FLOMAX) capsule 0.4 mg, 0.4 mg, Oral, Daily, Dew, Erskine Squibb, MD, 0.4 mg at 01/10/20 0837 .  vitamin B-12 (CYANOCOBALAMIN) tablet 1,000 mcg, 1,000 mcg, Oral, Daily, Sharen Hones, MD, 1,000 mcg at 01/10/20 7858    ALLERGIES   Ivp dye [iodinated diagnostic agents], Other, and Codeine    REVIEW OF SYSTEMS   10 point ROS unable to perform due to mild AMS  PHYSICAL EXAMINATION   Vital Signs: Temp:  [97.8 F (36.6 C)-98.7 F (37.1 C)] 98.5 F (36.9 C) (07/25 0342) Pulse Rate:  [89-99] 94 (07/25 0600) Resp:  [14-33] 20 (07/25 0600) BP: (94-127)/(58-93) 112/70 (07/25 0600) SpO2:  [83 %-100 %] 94 % (07/25 0600)  GENERAL:Age appropirate  HEAD: Normocephalic, atraumatic.  EYES: Pupils equal, round, reactive to light.  No scleral icterus.  MOUTH: Moist mucosal membrane. NECK: Supple. No thyromegaly. No nodules. No JVD.  PULMONARY: crackles bilaterally  CARDIOVASCULAR: S1 and S2. Regular rate and rhythm. No murmurs, rubs, or gallops.  GASTROINTESTINAL: Soft, nontender, non-distended. No masses. Positive bowel sounds. No hepatosplenomegaly. +suprapubic catheter MUSCULOSKELETAL: No swelling, clubbing, or edema.  NEUROLOGIC: Mild distress due to acute illness SKIN:intact,warm,dry   PERTINENT DATA     Infusions: . sodium chloride 250 mL (01/05/20 0616)  . albumin human Stopped (01/13/20 0010)  . furosemide (LASIX) infusion 8 mg/hr (01/13/20 0533)  . milrinone 0.3 mcg/kg/min (01/13/20 0533)   Scheduled Medications: . aspirin EC  81 mg Oral Daily  . Chlorhexidine Gluconate Cloth  6 each Topical Daily  . docusate sodium  100 mg Oral BID  . feeding supplement (NEPRO CARB STEADY)  237 mL Oral TID BM  . ferrous sulfate  325 mg Oral Q breakfast  . fluticasone  2 spray Each Nare Daily  . gabapentin  100 mg Oral  QHS  . heparin  5,000 Units Subcutaneous Q8H  . insulin aspart  0-15 Units Subcutaneous TID WC  . insulin glargine  10 Units Subcutaneous QHS  . lidocaine  1 patch Transdermal Q24H  . loratadine  10 mg Oral Daily  . metoCLOPramide (REGLAN) injection  10 mg Intravenous QID  . multivitamin with minerals  1 tablet Oral Daily  . pantoprazole (PROTONIX) IV  40 mg Intravenous BID  . patiromer  16.8 g Oral Daily  . polyethylene glycol  17 g Oral Daily  . scopolamine  1 patch Transdermal Q72H  . simethicone  80 mg Oral QID  . simvastatin  10 mg Oral QHS  . sodium chloride flush  3 mL Intravenous Once  . tamsulosin  0.4 mg Oral Daily  . vitamin B-12  1,000 mcg Oral Daily   PRN Medications: sodium chloride, acetaminophen **OR** acetaminophen, alum & mag hydroxide-simeth, calcium carbonate, guaiFENesin-dextromethorphan, ondansetron **OR** ondansetron (ZOFRAN) IV, oxyCODONE, polyvinyl alcohol, promethazine, sodium phosphate Hemodynamic parameters:   Intake/Output: 07/24 0701 - 07/25 0700 In: 882.4 [P.O.:168; I.V.:513.3; IV Piggyback:201.1] Out: 305 [Urine:305]  Ventilator  Settings:     LAB RESULTS:  Basic Metabolic Panel: Recent Labs  Lab 01/07/20 0309 01/07/20 0309 01/09/20 0446 01/09/20 0446 01/10/20 0526 01/10/20 0526 01/10/20 2146 01/10/20 2146 01/11/20 0443 01/11/20 0443 01/12/20 0529 01/13/20 0723  NA 131*   < > 127*   < > 128*  --  125*  --  127*  --  128* 126*  K 3.8   < > 4.9   < > 5.4*   < > 5.4*   < > 5.9*   < > 5.0 5.2*  CL 102   < > 95*   < > 98  --  94*  --  92*  --  94* 93*  CO2 23   < > 19*   < > 16*  --  20*  --  20*  --  18* 18*  GLUCOSE 60*   < > 195*   < > 223*  --  239*  --  296*  --  159* 150*  BUN 78*   < > 93*   < > 92*  --  98*  --  98*  --  99* 112*  CREATININE 1.93*   < > 2.81*   < > 3.07*  --  3.43*  --  3.55*  --  3.83* 4.19*  CALCIUM 7.6*   < > 7.7*   < > 7.6*  --  7.8*  --  8.0*  --  8.2* 8.1*  MG 2.0  --  2.1  --   --   --  2.2  --   --   --    --  2.4  PHOS  --   --   --   --   --   --   --   --   --   --   --  6.8*   < > = values in this interval not displayed.   Liver Function Tests: Recent Labs  Lab 01/11/20 0443 01/13/20 0723  AST 158* 50*  ALT 99* 60*  ALKPHOS 239* 162*  BILITOT 1.3* 1.5*  PROT 6.2* 5.9*  ALBUMIN 2.5* 3.3*   No results for input(s): LIPASE, AMYLASE in the last 168 hours. No results for input(s): AMMONIA in the last 168 hours. CBC: Recent Labs  Lab 01/07/20 0309 01/09/20 0446 01/11/20 0443 01/12/20 0529 01/13/20 0723  WBC 15.0* 12.8* 14.4* 13.1* 10.6*  NEUTROABS 10.9* 9.9*  --   --  9.2*  HGB 8.1* 8.5* 8.3* 8.1* 8.3*  HCT 23.9* 25.7* 26.7* 24.8* 24.9*  MCV 79.1* 81.3 85.0 83.5 83.0  PLT 550* 570* 550* 440* 413*   Cardiac Enzymes: No results for input(s): CKTOTAL, CKMB, CKMBINDEX, TROPONINI in the last 168 hours. BNP: Invalid input(s): POCBNP CBG: Recent Labs  Lab 01/12/20 0740 01/12/20 1136 01/12/20 1556 01/12/20 2158 01/13/20 0735  GLUCAP 161* 130* 142* 133* 140*       IMAGING RESULTS:  Imaging: No results found. '@PROBHOSP'$ @ No results found.   ASSESSMENT AND PLAN    -Multidisciplinary rounds held today  Acute decompensated systolic CHF with EF 60-73%  -Cardiology team on case - appreciate input - ischemic cardiomyopathy - recommendation for continued diuresis and milrinone gtt.  -complicated by severe TR and moderate MR -palliative consultation placed    Acute on chronic Renal Failure -most likely due to hypoperfusion from low effective circulating volume secondary to end stage CHF - nephrology on case - appreciate input -follow UO -continue diuresis per nephro   Severe protein calorie malnutrition  - bitemporal and peripheral muscle wating  - albumin <2.5  - repletion 1 amp 25% daily   Abdominal distension and vomiting   - unlikely complete SBO as patient had bowel movement yesteday but perhaps partial SBO now    - will repeat KUB today and attempt  OGT   S/p Left BKA   - monitor surgical site for infection   - monitor h/h   - adequate analgesia   Hypervolemic hypotonic hyponatremia   - due to CHF   - treating underlying decompensated HFrEF with ischemic cardiomyopathy   Severe hyperglycemia    - keep POC glucose <200 but >150 while in MICU   - adjusting weight based regimen -Lantus QHS>10units plus ACHS ISS  ID -continue IV abx as prescibed -follow up cultures  GI/Nutrition GI PROPHYLAXIS as indicated DIET-->TF's as tolerated Constipation protocol as indicated  ENDO - ICU hypoglycemic\Hyperglycemia protocol -check FSBS per protocol   ELECTROLYTES -follow labs as needed -replace as needed -pharmacy consultation   DVT/GI PRX ordered -SCDs  TRANSFUSIONS AS NEEDED MONITOR FSBS ASSESS the need for LABS as needed   Critical care provider statement:    Critical care time (minutes):  33   Critical care time was exclusive of:  Separately billable procedures and treating other patients   Critical  care was necessary to treat or prevent imminent or life-threatening deterioration of the following conditions:  acute decompensated systolic CHF, renal failure, protein calorie malnutrition , multiple comorbid conditions   Critical care was time spent personally by me on the following activities:  Development of treatment plan with patient or surrogate, discussions with consultants, evaluation of patient's response to treatment, examination of patient, obtaining history from patient or surrogate, ordering and performing treatments and interventions, ordering and review of laboratory studies and re-evaluation of patient's condition.  I assumed direction of critical care for this patient from another provider in my specialty: no    This document was prepared using Dragon voice recognition software and may include unintentional dictation errors.    Ottie Glazier, M.D.  Division of [place]

## 2020-01-13 NOTE — Procedures (Signed)
TRIPLE LUMEN DIALYSIS CATHETER INSERTION PROCEDURE NOTE  Indication: Patient receiving vesicant or irritant drug.; Patient receiving intravenous therapy for longer than 5 days.; Patient has limited or no vascular access.   Consent:DAUGHTER GINGER POA    Hand washing performed prior to starting the procedure.   Procedure:   An active timeout was performed and correct patient, name, & ID confirmed.   Patient was positioned correctly for central venous access.  Patient was prepped using strict sterile technique including chlorohexadine preps, sterile drape, sterile gown and sterile gloves.    The area was prepped, draped and anesthetized in the usual sterile manner. Patient comfort was obtained.    A triple lumen catheter was placed in RIGHT  Internal Jugular Vein There was good blood return, catheter caps were placed on lumens, catheter flushed easily, the line was secured and a sterile dressing and BIO-PATCH applied.   Ultrasound was used to visualize vasculature and guidance of needle.   Number of Attempts: 1 Complications:none Estimated Blood Loss: none Chest Radiograph indicated and ordered.      Ottie Glazier, M.D.  [facility] [place]

## 2020-01-13 NOTE — Progress Notes (Signed)
Per Dr Holley Raring remove PFR for CRRT is 60 cc/hr initially

## 2020-01-13 NOTE — Plan of Care (Signed)
Sacral ulceration appears improving, slough resolved, very early granulation noted, foam changed, pt w/increasing confusion, reports improved nausea, no pain, but also c/o "miserable, everything is just awful."  Intensivist spoke w/daughter Ginger at bedside, Ginger also brought patient's (ex)wife to visit him.  They are both at bedside now, patient states his pleasure at seeing them

## 2020-01-13 NOTE — Procedures (Signed)
Endotracheal Intubation: Patient required placement of an artificial airway secondary to Respiratory Failure  Consent: DAUGHTER POA GINGER  Hand washing performed prior to starting the procedure.   Medications administered for sedation prior to procedure:  Midazolam 2 mg IV,  Rocuronium 40 mg IV, Fentanyl 100 mcg IV.    A time out procedure was called and correct patient, name, & ID confirmed. Needed supplies and equipment were assembled and checked to include ETT, 10 ml syringe, Glidescope, Mac and Miller blades, suction, oxygen and bag mask valve, end tidal CO2 monitor.   Patient was positioned to align the mouth and pharynx to facilitate visualization of the glottis.   Heart rate, SpO2 and blood pressure was continuously monitored during the procedure. Pre-oxygenation was conducted prior to intubation and endotracheal tube was placed through the vocal cords into the trachea.     The artificial airway was placed under direct visualization via glidescope route using a 8 ETT on the first attempt.  ETT was secured at 24 cm mark.  Placement was confirmed by auscuitation of lungs with good breath sounds bilaterally and no stomach sounds.  Condensation was noted on endotracheal tube.   Pulse ox 98%.  CO2 detector in place with appropriate color change.   Complications: None .    Chest radiograph ordered and pending.   Comments: OGT placed via glidescope.   Ottie Glazier, M.D.  [facility] [place]

## 2020-01-13 NOTE — Progress Notes (Signed)
[facility]  ROUNDING NOTE   Subjective:   Renal function worse. Urine output only 305cc over the preceding 24 hours.  Na 126, K 5.2.  Cr up to 4.19.    Objective:  Vital signs in last 24 hours:  Temp:  [97.7 F (36.5 C)-98.7 F (37.1 C)] 97.7 F (36.5 C) (07/25 1200) Pulse Rate:  [89-99] 93 (07/25 1400) Resp:  [14-33] 18 (07/25 1400) BP: (92-127)/(52-95) 112/95 (07/25 1300) SpO2:  [93 %-100 %] 95 % (07/25 1400)  Weight change:  Filed Weights   01/04/2020 1533 01/11/20 1800  Weight: 75.8 kg (!) 95.9 kg    Intake/Output: I/O last 3 completed shifts: In: 1571.6 [P.O.:568; I.V.:621.4; IV Piggyback:382.2] Out: 675 [Urine:675]   Intake/Output this shift:  Total I/O In: 154.1 [I.V.:73.5; IV Piggyback:80.6] Out: 95 [Urine:95]  Physical Exam: General: NAD   Head: Normocephalic, atraumatic. Moist oral mucosal membranes  Eyes: Anicteric  Neck: Supple, trachea midline  Lungs:  Clear to auscultation  Heart: Regular rate and rhythm  Abdomen:  Soft, nontender, BS present  Extremities: + peripheral edema, L BKA stump wrapped  Neurologic: Nonfocal, moving all four extremities  Skin: No lesions  GU: Suprapubic foley catheter    Basic Metabolic Panel: Recent Labs  Lab 01/07/20 0309 01/07/20 0309 01/09/20 0446 01/09/20 0446 01/10/20 0526 01/10/20 0526 01/10/20 2146 01/10/20 2146 01/11/20 0443 01/12/20 0529 01/13/20 0723  NA 131*   < > 127*   < > 128*  --  125*  --  127* 128* 126*  K 3.8   < > 4.9   < > 5.4*  --  5.4*  --  5.9* 5.0 5.2*  CL 102   < > 95*   < > 98  --  94*  --  92* 94* 93*  CO2 23   < > 19*   < > 16*  --  20*  --  20* 18* 18*  GLUCOSE 60*   < > 195*   < > 223*  --  239*  --  296* 159* 150*  BUN 78*   < > 93*   < > 92*  --  98*  --  98* 99* 112*  CREATININE 1.93*   < > 2.81*   < > 3.07*  --  3.43*  --  3.55* 3.83* 4.19*  CALCIUM 7.6*   < > 7.7*   < > 7.6*   < > 7.8*   < > 8.0* 8.2* 8.1*  MG 2.0  --  2.1  --   --   --  2.2  --   --   --   2.4  PHOS  --   --   --   --   --   --   --   --   --   --  6.8*   < > = values in this interval not displayed.    Liver Function Tests: Recent Labs  Lab 01/11/20 0443 01/13/20 0723  AST 158* 50*  ALT 99* 60*  ALKPHOS 239* 162*  BILITOT 1.3* 1.5*  PROT 6.2* 5.9*  ALBUMIN 2.5* 3.3*   No results for input(s): LIPASE, AMYLASE in the last 168 hours. No results for input(s): AMMONIA in the last 168 hours.  CBC: Recent Labs  Lab 01/07/20 0309 01/09/20 0446 01/11/20 0443 01/12/20 0529 01/13/20 0723  WBC 15.0* 12.8* 14.4* 13.1* 10.6*  NEUTROABS 10.9* 9.9*  --   --  9.2*  HGB 8.1* 8.5* 8.3* 8.1* 8.3*  HCT  23.9* 25.7* 26.7* 24.8* 24.9*  MCV 79.1* 81.3 85.0 83.5 83.0  PLT 550* 570* 550* 440* 413*    Cardiac Enzymes: No results for input(s): CKTOTAL, CKMB, CKMBINDEX, TROPONINI in the last 168 hours.  BNP: Invalid input(s): POCBNP  CBG: Recent Labs  Lab 01/12/20 1556 01/12/20 2158 01/13/20 0735 01/13/20 1120 01/13/20 1556  GLUCAP 142* 133* 140* 161* 168*    Microbiology: Results for orders placed or performed during the hospital encounter of 12/22/2019  Blood Cultures x 2 sites     Status: None   Collection Time: 01/16/2020  8:48 PM   Specimen: BLOOD  Result Value Ref Range Status   Specimen Description BLOOD BLOOD LEFT FOREARM  Final   Special Requests   Final    BOTTLES DRAWN AEROBIC AND ANAEROBIC Blood Culture adequate volume   Culture   Final    NO GROWTH 5 DAYS Performed at [facility], [address] [place], [place] [address]    Report Status 01/12/2020 FINAL  Final  Culture, blood (Routine X 2) w Reflex to ID Panel     Status: None   Collection Time: 12/27/19  7:01 PM   Specimen: BLOOD  Result Value Ref Range Status   Specimen Description BLOOD BLOOD RIGHT HAND  Final   Special Requests   Final    BOTTLES DRAWN AEROBIC AND ANAEROBIC Blood Culture adequate volume   Culture   Final    NO GROWTH 5 DAYS Performed at [facility],  [address], [place], [place] [address]    Report Status 12/30/2019 FINAL  Final  SARS Coronavirus 2 by RT PCR (hospital order, performed in [facility] hospital lab) Nasopharyngeal Nasopharyngeal Swab     Status: None   Collection Time: 12/27/19  8:45 PM   Specimen: Nasopharyngeal Swab  Result Value Ref Range Status   SARS Coronavirus 2 NEGATIVE NEGATIVE Final    Comment: (NOTE) SARS-CoV-2 target nucleic acids are NOT DETECTED.  The SARS-CoV-2 RNA is generally detectable in upper and lower respiratory specimens during the acute phase of infection. The lowest concentration of SARS-CoV-2 viral copies this assay can detect is 250 copies / mL. A negative result does not preclude SARS-CoV-2 infection and should not be used as the sole basis for treatment or other patient management decisions.  A negative result may occur with improper specimen collection / handling, submission of specimen other than nasopharyngeal swab, presence of viral mutation(s) within the areas targeted by this assay, and inadequate number of viral copies (<250 copies / mL). A negative result must be combined with clinical observations, patient history, and epidemiological information.  Fact Sheet for Patients:   StrictlyIdeas.no  Fact Sheet for Healthcare Providers: BankingDealers.co.za  This test is not yet approved or  cleared by the [place] FDA and has been authorized for detection and/or diagnosis of SARS-CoV-2 by FDA under an Emergency Use Authorization (EUA).  This EUA will remain in effect (meaning this test can be used) for the duration of the COVID-19 declaration under Section 564(b)(1) of the Act, 21 U.S.C. section 360bbb-3(b)(1), unless the authorization is terminated or revoked sooner.  Performed at [facility], [place], [address] [place],  [address]   MRSA PCR Screening     Status: None   Collection Time: 12/27/19  8:45 PM    Specimen: Nasopharyngeal  Result Value Ref Range Status   MRSA by PCR NEGATIVE NEGATIVE Final    Comment:        The GeneXpert MRSA  Assay (FDA approved for NASAL specimens only), is one component of a comprehensive MRSA colonization surveillance program. It is not intended to diagnose MRSA infection nor to guide or monitor treatment for MRSA infections. Performed at [facility], [address] [place], [place] [address]   Urine Culture     Status: Abnormal   Collection Time: 01/08/20 10:45 AM   Specimen: Urine, Random  Result Value Ref Range Status   Specimen Description   Final    URINE, RANDOM Performed at [facility], [address] [place], [place] [address]    Special Requests   Final    NONE Performed at [facility], [address] [place], [place] [address]    Culture >=100,000 COLONIES/mL YEAST (A)  Final   Report Status 01/09/2020 FINAL  Final    Coagulation Studies: No results for input(s): LABPROT, INR in the last 72 hours.  Urinalysis: No results for input(s): COLORURINE, LABSPEC, PHURINE, GLUCOSEU, HGBUR, BILIRUBINUR, KETONESUR, PROTEINUR, UROBILINOGEN, NITRITE, LEUKOCYTESUR in the last 72 hours.  Invalid input(s): APPERANCEUR    Imaging: DG Abd 1 View  Result Date: 01/13/2020 CLINICAL DATA:  Small-bowel obstruction EXAM: ABDOMEN - 1 VIEW COMPARISON:  01/09/2020 FINDINGS: The stomach is gas distended. The small bowel and colon are gas-filled, however nondistended, with gas present to the rectum. No significant burden of stool. No free air in the abdomen on supine radiographs. Vascular calcinosis. IMPRESSION: The stomach is gas distended. The small bowel and colon are gas-filled, however nondistended, with gas present to the rectum. No significant burden of stool. Radiographic findings are not significant changed compared to prior examination dated 01/09/2020. Electronically Signed   By: Eddie Candle M.D.   On: 01/13/2020  10:07     Medications:   . sodium chloride 250 mL (01/05/20 0616)  . albumin human Stopped (01/13/20 1121)  . fentaNYL    . fentaNYL infusion INTRAVENOUS    . furosemide (LASIX) infusion 8 mg/hr (01/13/20 1200)  . milrinone 0.3 mcg/kg/min (01/13/20 1224)   . aspirin EC  81 mg Oral Daily  . Chlorhexidine Gluconate Cloth  6 each Topical Daily  . docusate sodium  100 mg Oral BID  . feeding supplement (NEPRO CARB STEADY)  237 mL Oral TID BM  . ferrous sulfate  325 mg Oral Q breakfast  . fluticasone  2 spray Each Nare Daily  . gabapentin  100 mg Oral QHS  . heparin  5,000 Units Subcutaneous Q8H  . insulin aspart  0-15 Units Subcutaneous TID WC  . insulin glargine  10 Units Subcutaneous QHS  . lidocaine  1 patch Transdermal Q24H  . loratadine  10 mg Oral Daily  . metoCLOPramide (REGLAN) injection  10 mg Intravenous QID  . multivitamin with minerals  1 tablet Oral Daily  . pantoprazole (PROTONIX) IV  40 mg Intravenous BID  . patiromer  16.8 g Oral Daily  . polyethylene glycol  17 g Oral Daily  . scopolamine  1 patch Transdermal Q72H  . simethicone  80 mg Oral QID  . simvastatin  10 mg Oral QHS  . sodium chloride flush  3 mL Intravenous Once  . tamsulosin  0.4 mg Oral Daily  . vitamin B-12  1,000 mcg Oral Daily   sodium chloride, acetaminophen **OR** acetaminophen, alum & mag hydroxide-simeth, calcium carbonate, guaiFENesin-dextromethorphan, ondansetron **OR** ondansetron (ZOFRAN) IV, oxyCODONE, polyvinyl alcohol, promethazine, sodium phosphate  Assessment/ Plan:   Mr. [name] is a 79 y.o. 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 Lucky Cowboy  1. Acute renal failure with hyperkalemia and metabolic acidosis on chronic kidney diease stage IV with baseline creatinine of 2.24, GFR of 26 on 02/11/2018. Chronic kidney disease secondary to diabetic nephropathy, hypertension and obstructive uropathy.  No IV contrast. Renal ultrasound with no obstruction.  Acute renal failure seems to be related to concurrent illness -Pt now oliguric and Cr rising, also still on milrinone.  Family desiring aggressive care.  Therefore we will proceed with initiation of CRRT once temporary dialysis catheter has been placed.  Okay to continue furosemide drip till then.  2. Hypertension: with peripheral edema. New onset systolic congestive heart failure Maintain the patient on Lasix drip for now but will add ultrafiltration with CRRT.  3. Hyponatremia: secondary to volume overload and renal failure.  -Sodium down a bit to 126.  Should correct with CRRT.   LOS: 18 Joneisha Miles 7/25/20213:58 PM

## 2020-01-14 ENCOUNTER — Inpatient Hospital Stay: Payer: No Typology Code available for payment source

## 2020-01-14 ENCOUNTER — Encounter: Payer: Self-pay | Admitting: Internal Medicine

## 2020-01-14 DIAGNOSIS — N179 Acute kidney failure, unspecified: Secondary | ICD-10-CM

## 2020-01-14 DIAGNOSIS — Z7189 Other specified counseling: Secondary | ICD-10-CM | POA: Diagnosis not present

## 2020-01-14 DIAGNOSIS — R6521 Severe sepsis with septic shock: Secondary | ICD-10-CM

## 2020-01-14 DIAGNOSIS — J9601 Acute respiratory failure with hypoxia: Secondary | ICD-10-CM

## 2020-01-14 DIAGNOSIS — I5023 Acute on chronic systolic (congestive) heart failure: Secondary | ICD-10-CM

## 2020-01-14 DIAGNOSIS — R112 Nausea with vomiting, unspecified: Secondary | ICD-10-CM | POA: Diagnosis not present

## 2020-01-14 DIAGNOSIS — Z515 Encounter for palliative care: Secondary | ICD-10-CM

## 2020-01-14 DIAGNOSIS — A419 Sepsis, unspecified organism: Secondary | ICD-10-CM

## 2020-01-14 LAB — RENAL FUNCTION PANEL
Albumin: 3.5 g/dL (ref 3.5–5.0)
Albumin: 3.7 g/dL (ref 3.5–5.0)
Anion gap: 7 (ref 5–15)
Anion gap: 11 (ref 5–15)
BUN: 73 mg/dL — ABNORMAL HIGH (ref 8–23)
BUN: 49 mg/dL — ABNORMAL HIGH (ref 8–23)
CO2: 26 mmol/L (ref 22–32)
CO2: 22 mmol/L (ref 22–32)
Calcium: 7.3 mg/dL — ABNORMAL LOW (ref 8.9–10.3)
Calcium: 7.8 mg/dL — ABNORMAL LOW (ref 8.9–10.3)
Chloride: 100 mmol/L (ref 98–111)
Chloride: 100 mmol/L (ref 98–111)
Creatinine, Ser: 2.87 mg/dL — ABNORMAL HIGH (ref 0.61–1.24)
Creatinine, Ser: 2.11 mg/dL — ABNORMAL HIGH (ref 0.61–1.24)
GFR calc Af Amer: 23 mL/min — ABNORMAL LOW (ref >60)
GFR calc Af Amer: 33 mL/min — ABNORMAL LOW (ref >60)
GFR calc non Af Amer: 20 mL/min — ABNORMAL LOW (ref >60)
GFR calc non Af Amer: 28 mL/min — ABNORMAL LOW (ref >60)
Glucose, Bld: 129 mg/dL — ABNORMAL HIGH (ref 70–99)
Glucose, Bld: 119 mg/dL — ABNORMAL HIGH (ref 70–99)
Phosphorus: 4.9 mg/dL — ABNORMAL HIGH (ref 2.5–4.6)
Phosphorus: 3.3 mg/dL (ref 2.5–4.6)
Potassium: 4.5 mmol/L (ref 3.5–5.1)
Potassium: 4.2 mmol/L (ref 3.5–5.1)
Sodium: 133 mmol/L — ABNORMAL LOW (ref 135–145)
Sodium: 133 mmol/L — ABNORMAL LOW (ref 135–145)

## 2020-01-14 LAB — HEMOGLOBIN AND HEMATOCRIT, BLOOD
HCT: 23.2 % — ABNORMAL LOW (ref 39.0–52.0)
Hemoglobin: 7.6 g/dL — ABNORMAL LOW (ref 13.0–17.0)

## 2020-01-14 LAB — CBC
HCT: 24.4 % — ABNORMAL LOW (ref 39.0–52.0)
HCT: 24 % — ABNORMAL LOW (ref 39.0–52.0)
Hemoglobin: 7.6 g/dL — ABNORMAL LOW (ref 13.0–17.0)
Hemoglobin: 7.4 g/dL — ABNORMAL LOW (ref 13.0–17.0)
MCH: 26.9 pg (ref 26.0–34.0)
MCH: 27.1 pg (ref 26.0–34.0)
MCHC: 31.1 g/dL (ref 30.0–36.0)
MCHC: 30.8 g/dL (ref 30.0–36.0)
MCV: 86.2 fL (ref 80.0–100.0)
MCV: 87.9 fL (ref 80.0–100.0)
Platelets: 310 10*3/uL (ref 150–400)
Platelets: 225 10*3/uL (ref 150–400)
RBC: 2.83 MIL/uL — ABNORMAL LOW (ref 4.22–5.81)
RBC: 2.73 MIL/uL — ABNORMAL LOW (ref 4.22–5.81)
RDW: 21.6 % — ABNORMAL HIGH (ref 11.5–15.5)
RDW: 22.3 % — ABNORMAL HIGH (ref 11.5–15.5)
WBC: 7.2 10*3/uL (ref 4.0–10.5)
WBC: 11.2 10*3/uL — ABNORMAL HIGH (ref 4.0–10.5)
nRBC: 0.6 % — ABNORMAL HIGH (ref 0.0–0.2)
nRBC: 0.2 % (ref 0.0–0.2)

## 2020-01-14 LAB — BLOOD GAS, ARTERIAL
Acid-base deficit: 3.2 mmol/L — ABNORMAL HIGH (ref 0.0–2.0)
Bicarbonate: 20.8 mmol/L (ref 20.0–28.0)
FIO2: 0.21
MECHVT: 500 mL
O2 Saturation: 95 %
PEEP: 5 cmH2O
Patient temperature: 37
RATE: 16 resp/min
pCO2 arterial: 32 mmHg (ref 32.0–48.0)
pH, Arterial: 7.42 (ref 7.350–7.450)
pO2, Arterial: 74 mmHg — ABNORMAL LOW (ref 83.0–108.0)

## 2020-01-14 LAB — GLUCOSE, CAPILLARY
Glucose-Capillary: 111 mg/dL — ABNORMAL HIGH (ref 70–99)
Glucose-Capillary: 111 mg/dL — ABNORMAL HIGH (ref 70–99)
Glucose-Capillary: 115 mg/dL — ABNORMAL HIGH (ref 70–99)
Glucose-Capillary: 114 mg/dL — ABNORMAL HIGH (ref 70–99)

## 2020-01-14 LAB — MAGNESIUM: Magnesium: 2.3 mg/dL (ref 1.7–2.4)

## 2020-01-14 MED ORDER — ALBUMIN HUMAN 25 % IV SOLN
25.0000 g | Freq: Three times a day (TID) | INTRAVENOUS | Status: AC
  Administered 2020-01-14 – 2020-01-16 (×9): 25 g via INTRAVENOUS
  Filled 2020-01-14 (×9): qty 100

## 2020-01-14 MED ORDER — VECURONIUM BROMIDE 10 MG IV SOLR: 10.0000 mg | Freq: Once | INTRAVENOUS | Status: AC

## 2020-01-14 MED ORDER — CALCIUM CARBONATE ANTACID 500 MG PO CHEW: 1.0000 | CHEWABLE_TABLET | Freq: Three times a day (TID) | ORAL | Status: DC | PRN

## 2020-01-14 MED ORDER — LORAZEPAM 2 MG/ML IJ SOLN: 4.0000 mg | Freq: Once | INTRAMUSCULAR | Status: AC

## 2020-01-14 MED ORDER — GUAIFENESIN-DM 100-10 MG/5ML PO SYRP: 10.0000 mL | ORAL_SOLUTION | Freq: Four times a day (QID) | ORAL | Status: DC | PRN

## 2020-01-14 MED ORDER — ACETAMINOPHEN 325 MG PO TABS: 650.0000 mg | ORAL_TABLET | Freq: Four times a day (QID) | ORAL | Status: DC | PRN

## 2020-01-14 MED ORDER — ASPIRIN 81 MG PO CHEW
81.0000 mg | CHEWABLE_TABLET | Freq: Every day | ORAL | Status: DC
  Administered 2020-01-14 – 2020-01-17 (×4): 81 mg
  Filled 2020-01-14 (×4): qty 1

## 2020-01-14 MED ORDER — POLYETHYLENE GLYCOL 3350 17 G PO PACK
17.0000 g | PACK | Freq: Every day | ORAL | Status: DC
  Administered 2020-01-14 – 2020-01-17 (×4): 17 g
  Filled 2020-01-14 (×4): qty 1

## 2020-01-14 MED ORDER — ALUM & MAG HYDROXIDE-SIMETH 200-200-20 MG/5ML PO SUSP: 15.0000 mL | Freq: Four times a day (QID) | ORAL | Status: DC | PRN

## 2020-01-14 MED ORDER — ACETAMINOPHEN 650 MG RE SUPP: 650.0000 mg | Freq: Four times a day (QID) | RECTAL | Status: DC | PRN

## 2020-01-14 MED ORDER — OXYCODONE HCL 5 MG PO TABS: 5.0000 mg | ORAL_TABLET | Freq: Four times a day (QID) | ORAL | Status: DC | PRN

## 2020-01-14 MED ORDER — LORAZEPAM 2 MG/ML IJ SOLN
INTRAMUSCULAR | Status: AC
  Administered 2020-01-14: 4 mg via INTRAVENOUS
  Filled 2020-01-14: qty 2

## 2020-01-14 MED ORDER — SIMVASTATIN 20 MG PO TABS
10.0000 mg | ORAL_TABLET | Freq: Every day | ORAL | Status: DC
  Administered 2020-01-14 – 2020-01-16 (×3): 10 mg
  Filled 2020-01-14 (×3): qty 1

## 2020-01-14 MED ORDER — DOCUSATE SODIUM 50 MG/5ML PO LIQD
100.0000 mg | Freq: Two times a day (BID) | ORAL | Status: DC
  Administered 2020-01-14 – 2020-01-17 (×7): 100 mg
  Filled 2020-01-14 (×7): qty 10

## 2020-01-14 MED ORDER — LORATADINE 10 MG PO TABS
10.0000 mg | ORAL_TABLET | Freq: Every day | ORAL | Status: DC
  Administered 2020-01-14 – 2020-01-17 (×3): 10 mg
  Filled 2020-01-14 (×4): qty 1

## 2020-01-14 MED ORDER — VECURONIUM BROMIDE 10 MG IV SOLR
INTRAVENOUS | Status: AC
  Administered 2020-01-14: 10 mg via INTRAVENOUS
  Filled 2020-01-14: qty 10

## 2020-01-14 MED ORDER — ASPIRIN 81 MG PO CHEW: 81.0000 mg | CHEWABLE_TABLET | Freq: Every day | ORAL | Status: DC

## 2020-01-14 MED ORDER — SIMETHICONE 80 MG PO CHEW
80.0000 mg | CHEWABLE_TABLET | Freq: Four times a day (QID) | ORAL | Status: DC
  Administered 2020-01-14: 80 mg
  Filled 2020-01-14 (×4): qty 1

## 2020-01-14 MED ORDER — ORAL CARE MOUTH RINSE
15.0000 mL | OROMUCOSAL | Status: DC
  Administered 2020-01-14 – 2020-01-17 (×32): 15 mL via OROMUCOSAL

## 2020-01-14 MED ORDER — ADULT MULTIVITAMIN W/MINERALS CH
1.0000 | ORAL_TABLET | Freq: Every day | ORAL | Status: DC
  Administered 2020-01-14 – 2020-01-17 (×4): 1
  Filled 2020-01-14 (×4): qty 1

## 2020-01-14 MED ORDER — CHLORHEXIDINE GLUCONATE 0.12% ORAL RINSE (MEDLINE KIT)
15.0000 mL | Freq: Two times a day (BID) | OROMUCOSAL | Status: DC
  Administered 2020-01-14 – 2020-01-17 (×7): 15 mL via OROMUCOSAL

## 2020-01-14 MED ORDER — VITAMIN B-12 1000 MCG PO TABS
1000.0000 ug | ORAL_TABLET | Freq: Every day | ORAL | Status: DC
  Administered 2020-01-14 – 2020-01-17 (×4): 1000 ug
  Filled 2020-01-14 (×4): qty 1

## 2020-01-14 NOTE — Plan of Care (Signed)
  Problem: Clinical Measurements: Goal: Will remain free from infection Outcome: Progressing   Problem: Activity: Goal: Risk for activity intolerance will decrease Outcome: Progressing   Problem: Elimination: Goal: Will not experience complications related to bowel motility Outcome: Progressing   Problem: Pain Managment: Goal: General experience of comfort will improve Outcome: Progressing   Problem: Safety: Goal: Ability to remain free from injury will improve Outcome: Progressing   

## 2020-01-14 NOTE — Progress Notes (Signed)
Nolon Nations updated (step-daughter) vi phone.

## 2020-01-14 NOTE — Progress Notes (Signed)
Antonio Antigua, MD [address] [place], [place], [place], [address] [address], [place], [place], [place], [address] Phone: [phone]  Fax: [phone]   Subjective:  Patient is intubated and sedated.  NG tube to suction has minimal output.  Nurses bedside reports minimal output and no emesis  Objective: Exam: Vital signs in last 24 hours: Vitals:   01/14/20 0735 01/14/20 0800 01/14/20 0900 01/14/20 1000  BP:  (!) 108/56 (!) 99/61 (!) 97/54  Pulse:  65 70 69  Resp:  '16 16 21  '$ Temp:  97.9 F (36.6 C) 97.7 F (36.5 C)   TempSrc:      SpO2: 95% 100% 99% 99%  Weight:      Height:       Weight change:   Intake/Output Summary (Last 24 hours) at 01/14/2020 1201 Last data filed at 01/14/2020 1100 Gross per 24 hour  Intake 1078.69 ml  Output 1749 ml  Net -670.31 ml    General:, Intubated sedated Abd: Soft, NT/ND, No HSM Skin: Warm, no rashes Neck: Supple, Trachea midline   Lab Results: Lab Results  Component Value Date   WBC 7.2 01/14/2020   HGB 7.6 (L) 01/14/2020   HCT 24.4 (L) 01/14/2020   MCV 86.2 01/14/2020   PLT 310 01/14/2020   Micro Results: Recent Results (from the past 240 hour(s))  Urine Culture     Status: Abnormal   Collection Time: 01/08/20 10:45 AM   Specimen: Urine, Random  Result Value Ref Range Status   Specimen Description   Final    URINE, RANDOM Performed at [facility], [address], [place], [place] [address]    Special Requests   Final    NONE Performed at [facility], [address] [place], [place] [address]    Culture >=100,000 COLONIES/mL YEAST (A)  Final   Report Status 01/09/2020 FINAL  Final   Studies/Results: DG Abd 1 View  Result Date: 01/13/2020 CLINICAL DATA:  Small-bowel obstruction EXAM: ABDOMEN - 1 VIEW COMPARISON:  01/09/2020 FINDINGS: The stomach is gas distended. The small bowel and colon are gas-filled, however nondistended, with gas present to the rectum. No significant  burden of stool. No free air in the abdomen on supine radiographs. Vascular calcinosis. IMPRESSION: The stomach is gas distended. The small bowel and colon are gas-filled, however nondistended, with gas present to the rectum. No significant burden of stool. Radiographic findings are not significant changed compared to prior examination dated 01/09/2020. Electronically Signed   By: Eddie Candle M.D.   On: 01/13/2020 10:07   DG Chest Port 1 View  Result Date: 01/13/2020 CLINICAL DATA:  Intubation, orogastric tube placement EXAM: PORTABLE CHEST 1 VIEW COMPARISON:  Portable exam 1600 hours compared to 08/11/2015 FINDINGS: Rotated to the RIGHT. Tip of endotracheal tube projects 4.6 cm above carina. Nasogastric tube extends into stomach. RIGHT jugular central venous catheter with tip projecting over cavoatrial junction. LEFT subclavian transvenous pacemaker with leads projecting over RIGHT atrium, RIGHT ventricle, and coronary sinus. Upper normal size of cardiac silhouette. Mediastinal contours and pulmonary vascularity normal for degree of rotation. Bibasilar atelectasis and small RIGHT pleural effusion. Mild perihilar infiltrates which could represent edema or infection. No pneumothorax. Gaseous distention of stomach. IMPRESSION: Line and tube positions as above. Small RIGHT pleural effusion, bibasilar atelectasis and perihilar infiltrates question edema versus infection. Electronically Signed   By: Lavonia Dana M.D.   On: 01/13/2020 18:10   DG Abd Portable 1V  Result Date: 01/13/2020 CLINICAL DATA:  79 year old male status post orogastric tube placement. EXAM: PORTABLE ABDOMEN - 1 VIEW COMPARISON:  01/13/2020. FINDINGS: Interval placement of enteric tube with tip projecting over the proximal body of the stomach. There is persistent gaseous distension of the stomach. Several dilated loops of gas-filled small bowel are again noted measuring up to 4 cm in the left side of the abdomen. Pacemaker leads projecting  over the inferior aspect of the heart. IMPRESSION: 1. Tip of enteric tube in the proximal body of the stomach. 2. Persistent gaseous distension of the stomach and small bowel, concerning for bowel obstruction. Electronically Signed   By: Vinnie Langton M.D.   On: 01/13/2020 18:09   Medications:  Scheduled Meds: . aspirin  81 mg Per Tube Daily  . chlorhexidine gluconate (MEDLINE KIT)  15 mL Mouth Rinse BID  . Chlorhexidine Gluconate Cloth  6 each Topical Daily  . docusate  100 mg Per Tube BID  . ferrous sulfate  325 mg Oral Q breakfast  . fluticasone  2 spray Each Nare Daily  . gabapentin  100 mg Oral QHS  . heparin  5,000 Units Subcutaneous Q8H  . insulin aspart  0-15 Units Subcutaneous TID WC  . insulin glargine  10 Units Subcutaneous QHS  . lidocaine  1 patch Transdermal Q24H  . loratadine  10 mg Per Tube Daily  . mouth rinse  15 mL Mouth Rinse 10 times per day  . metoCLOPramide (REGLAN) injection  10 mg Intravenous QID  . multivitamin with minerals  1 tablet Per Tube Daily  . pantoprazole (PROTONIX) IV  40 mg Intravenous BID  . patiromer  16.8 g Oral Daily  . polyethylene glycol  17 g Per Tube Daily  . scopolamine  1 patch Transdermal Q72H  . simethicone  80 mg Per Tube QID  . simvastatin  10 mg Per Tube QHS  . sodium chloride flush  3 mL Intravenous Once  . tamsulosin  0.4 mg Oral Daily  . vitamin B-12  1,000 mcg Per Tube Daily   Continuous Infusions: .  prismasol BGK 4/2.5 500 mL/hr at 01/13/20 1814  .  prismasol BGK 4/2.5 500 mL/hr at 01/13/20 1745  . sodium chloride 250 mL (01/05/20 0616)  . albumin human 25 g (01/14/20 1139)  . dexmedetomidine (PRECEDEX) IV infusion 0.7 mcg/kg/hr (01/14/20 1150)  . fentaNYL infusion INTRAVENOUS 75 mcg/hr (01/14/20 1100)  . milrinone 0.3 mcg/kg/min (01/14/20 1152)  . norepinephrine (LEVOPHED) Adult infusion 6 mcg/min (01/14/20 1100)  . 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 (HCC) Active Problems:   Coronary artery disease   Hypertension associated with diabetes ([place])   Urinary retention   Type 2 diabetes mellitus with foot ulcer (HCC)   CKD (chronic kidney disease), stage III   Cellulitis of left foot   Hyperlipidemia associated with type 2 diabetes mellitus (HCC)   Malnutrition of moderate degree    Plan: NG tube with minimal output X-ray yesterday was reported to be concerning for small bowel obstruction  Would recommend surgery consult in light of the above  No indication for EGD in setting of small bowel obstruction it can worsen the clinical picture, as air use during the EGD can worsen distention  In addition, in light of ongoing acute issues and infection, endoscopic  procedures would be higher risks than benefits  No signs of active GI bleeding      LOS: 19 days   Antonio Antigua, MD 01/14/2020, 12:01 PM

## 2020-01-14 NOTE — Progress Notes (Signed)
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 Patient was started on IV vancomycin, Rocephin and Flagyl, podiatry and vascular surgeon were consulted. Patient had a angiogram and revascularization on 7/12. But the foot gangrene does not seem to be improving, as a result, planning for below-knee amputation. 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 Cardiology saw patient and recommendation for milrinone drip with diuresis. PCCM consult for further evaluation and management.    01/13/20 - patient has worsening abdominal distension and he had few more episodes of bilious vomitus, he is altered with encephalopathy and is clinically declining. Goals of Care:   Family brought in written advanced directive. Patient wishes to be full code and an assigned POA is named (daughter Ginger) in case he is unable to make decisions. Ginger is present during [employer] discussion.  We reviewed GI recommendation for NGT decompression and nephrology recommendation for dialysis and cardiology favor CVP trending while on milrinone gtt.  They whish to proceed with these procedures.  Additionally we discussed continued vomiting in context of lethargy with shock and they do wish for patient to be intubated for airway protection. They do understand that patient is overall with poor prognosis and wish to attept at helping him through this but will not wish for prolonged aggressive care if despite full scope of therapy patient does not improve.  7/26 remains intubated,sedated on CRRT on pressors +multiorgan failure   CC  follow  up respiratory failure  SUBJECTIVE Patient remains critically ill Prognosis is guarded   BP (!) 111/53    Pulse 67    Temp 98.1 F (36.7 C)    Resp 17    Ht 6\' 2"  (1.88 m)    Wt (!) 92.8 kg    SpO2 99%    BMI 26.27 kg/m    I/O last 3 completed shifts: In: 1218.6 [I.V.:945.2; IV Piggyback:273.4] Out: 1224 [Urine:370; Emesis/NG output:150; Other:1225] Total I/O In: -  Out: 155 [Urine:25; Other:130]  SpO2: 99 % O2 Flow Rate (L/min): 2 L/min FiO2 (%): 21 %  Estimated body mass index is 26.27 kg/m as calculated from the following:   Height as of this encounter: 6\' 2"  (1.88 m).   Weight as of this encounter: 92.8 kg.  SIGNIFICANT EVENTS   REVIEW OF SYSTEMS  PATIENT IS UNABLE TO PROVIDE COMPLETE REVIEW OF SYSTEMS DUE TO SEVERE CRITICAL ILLNESS   Pressure Injury 12/29/19 Buttocks Medial Stage 3 -  Full thickness tissue loss. Subcutaneous fat may be visible but bone, tendon or muscle are NOT exposed. Evolved to patchy areas of partial thickness wounds related to moisture associated skin damage whe (Active)  12/29/19 0442  Location: Buttocks  Location Orientation: Medial  Staging: Stage 3 -  Full thickness tissue loss. Subcutaneous fat may be visible but bone, tendon or muscle are NOT exposed.  Wound Description (Comments): Evolved to patchy areas of partial thickness wounds related to moisture associated skin damage when assessed on 7/21  Present on Admission: Yes (Pt reported area was present before admission)      PHYSICAL EXAMINATION:  GENERAL:critically ill appearing, +resp distress HEAD: Normocephalic, atraumatic.  EYES: Pupils equal, round, reactive to light.  No scleral icterus.  MOUTH: Moist mucosal membrane. NECK: Supple.  PULMONARY: +rhonchi, +wheezing CARDIOVASCULAR: S1 and S2. Regular rate and rhythm. No murmurs, rubs, or gallops.  GASTROINTESTINAL: Soft, nontender, -distended.  Positive bowel sounds.   MUSCULOSKELETAL: LT BKA NEUROLOGIC: obtunded,  GCS<8 SKIN:intact,warm,dry  MEDICATIONS: I have reviewed all medications and confirmed regimen as documented   CULTURE RESULTS   Recent Results (from the past 240 hour(s))  Urine Culture     Status: Abnormal   Collection Time: 01/08/20 10:45 AM   Specimen: Urine, Random  Result Value Ref Range Status   Specimen Description   Final    URINE, RANDOM Performed at [facility], [address] [place], [place] [address]    Special Requests   Final    NONE Performed at [facility], [address] [place],  [address]    Culture >=100,000 COLONIES/mL YEAST (A)  Final   Report Status 01/09/2020 FINAL  Final          IMAGING    DG Abd 1 View  Result Date: 01/13/2020 CLINICAL DATA:  Small-bowel obstruction EXAM: ABDOMEN - 1 VIEW COMPARISON:  01/09/2020 FINDINGS: The stomach is gas distended. The small bowel and colon are gas-filled, however nondistended, with gas present to the rectum. No significant burden of stool. No free air in the abdomen on supine radiographs. Vascular calcinosis. IMPRESSION: The stomach is gas distended. The small bowel and colon are gas-filled, however nondistended, with gas present to the rectum. No significant burden of stool. Radiographic findings are not significant changed compared to prior examination dated 01/09/2020. Electronically Signed   By: Eddie Candle M.D.   On: 01/13/2020 10:07   DG Chest Port 1 View  Result Date: 01/13/2020 CLINICAL DATA:  Intubation, orogastric tube placement EXAM: PORTABLE CHEST 1 VIEW COMPARISON:  Portable exam 1600 hours compared to 08/11/2015 FINDINGS: Rotated to the RIGHT. Tip of endotracheal tube projects 4.6 cm above carina. Nasogastric tube extends into stomach. RIGHT jugular central venous catheter with tip projecting over cavoatrial junction. LEFT subclavian transvenous pacemaker with leads projecting over RIGHT atrium, RIGHT ventricle, and coronary sinus. Upper normal size of cardiac  silhouette. Mediastinal contours and pulmonary vascularity normal for degree of rotation. Bibasilar atelectasis and small RIGHT pleural effusion. Mild perihilar infiltrates which could represent edema or infection. No pneumothorax. Gaseous distention of stomach. IMPRESSION: Line and tube positions as above. Small RIGHT pleural effusion, bibasilar atelectasis and perihilar infiltrates question edema versus infection. Electronically Signed   By: Lavonia Dana M.D.   On: 01/13/2020 18:10   DG Abd Portable 1V  Result Date: 01/13/2020 CLINICAL DATA:  79 year old male status post orogastric tube placement. EXAM: PORTABLE ABDOMEN - 1 VIEW COMPARISON:  01/13/2020. FINDINGS: Interval placement of enteric tube with tip projecting over the proximal body of the stomach. There is persistent gaseous distension of the stomach. Several dilated loops of gas-filled small bowel are again noted measuring up to 4 cm in the left side of the abdomen. Pacemaker leads projecting over the inferior aspect of the heart. IMPRESSION: 1. Tip of enteric tube in the proximal body of the stomach. 2. Persistent gaseous distension of the stomach and small bowel, concerning for bowel obstruction. Electronically Signed   By: Vinnie Langton M.D.   On: 01/13/2020 18:09     Nutrition Status: Nutrition Problem: Moderate Malnutrition Etiology: chronic illness (CKD, hx chronic left foot ulcer now s/p left BKA) Signs/Symptoms: moderate fat depletion, moderate muscle depletion Interventions: Refer to RD note for recommendations     Indwelling Urinary Catheter continued, requirement due to  Reason to continue Indwelling Urinary Catheter strict Intake/Output monitoring for hemodynamic instability   Central Line/ continued, requirement due to  Reason to continue [place] of central venous pressure or other hemodynamic parameters and poor IV access   Ventilator continued, requirement due to severe respiratory failure    Ventilator Sedation RASS 0 to -2      ASSESSMENT AND PLAN SYNOPSIS  78 yo male with progressive hypoxic resp failure from acute and severe decompensated systolic heart failure likely from demand ischemia from extensive vasc surgery complicated  by abd distention and renal failure with acidosis and signs and symptoms of ileus with severe metabolic encephalopathy  Severe ACUTE Hypoxic and Hypercapnic Respiratory Failure -continue Full MV support -continue Bronchodilator Therapy -Wean Fio2 and PEEP as tolerated -VAP/VENT bundle implementation  ACUTE SYSTOLIC CARDIAC FAILURE- EF 20% vent and CRRT support  Obesity, possible OSA.   Will certainly impact respiratory mechanics, ventilator weaning  ACUTE KIDNEY INJURY/Renal Failure -continue Foley Catheter-assess need -Avoid nephrotoxic agents -Follow urine output, BMP -Ensure adequate renal perfusion, optimize oxygenation -Renal dose medications On CRRT     NEUROLOGY Acute toxic metabolic encephalopathy, need for sedation Goal RASS -2 to -3  SHOCK-CARDIOGENIC -use vasopressors to keep MAP>65 -follow ABG and LA -follow up cultures -emperic ABX  CARDIAC ICU monitoring   GI GI PROPHYLAXIS as indicated  NUTRITIONAL STATUS Nutrition Status: Nutrition Problem: Moderate Malnutrition Etiology: chronic illness (CKD, hx chronic left foot ulcer now s/p left BKA) Signs/Symptoms: moderate fat depletion, moderate muscle depletion Interventions: Refer to RD note for recommendations   DIET-->TF's as tolerated Constipation protocol as indicated  ENDO - will use ICU hypoglycemic\Hyperglycemia protocol if indicated     ELECTROLYTES -follow labs as needed -replace as needed -pharmacy consultation and following   DVT/GI PRX ordered and assessed TRANSFUSIONS AS NEEDED MONITOR FSBS I Assessed the need for Labs I Assessed the need for Foley I Assessed the need for Central Venous Line Family Discussion when  available I Assessed the need for Mobilization I made an Assessment of medications to be adjusted accordingly Safety Risk assessment completed   CASE DISCUSSED IN MULTIDISCIPLINARY ROUNDS WITH ICU TEAM  Critical Care Time devoted to patient care services described in this note is 45 minutes.   Overall, patient is critically ill, prognosis is guarded.  Patient with Multiorgan failure and at high risk for cardiac arrest and death.   Recommend DNR status, prognosis is very poor  Corrin Parker, M.D.  [facility]  Medical Director [facility] Cardio-Pulmonary Department

## 2020-01-14 NOTE — TOC Progression Note (Signed)
Transition of Care [facility]) - Progression Note    Patient Details  Name: [name] MRN: [id number] Date of Birth: 09-12-1940  Transition of Care [facility]) CM/SW [place], RN Phone Number: 01/14/2020, 10:17 AM  Clinical Narrative:   Current Authorization to go to SNF has Expired and if needs to go to SNF new Auth will need to be started and approval gotten    Expected Discharge Plan: [place] Barriers to Discharge: Continued Medical Work up  Expected Discharge Plan and Services Expected Discharge Plan: [place]   Discharge Planning Services: CM Consult   Living arrangements for the past 2 months: Single Family Home                                       Social Determinants of Health (SDOH) Interventions    Readmission Risk Interventions No flowsheet data found.

## 2020-01-14 NOTE — Progress Notes (Signed)
Respiratory therapist called due to audible cuff leak.  Cuff assessed by Beverlee Nims Respiratory therapist and appropriate changes made.  Respiratory therapist called back again for endotracheal cuff leak.  Dr. Mortimer Fries asked to come to bedside to assess cuff leak.  Per MD continue to monitor patient.

## 2020-01-14 NOTE — Consult Note (Signed)
Consultation Note Date: 01/14/2020   Patient Name: NINA HOAR  DOB: 24-Aug-1940  MRN: [id number]  Age / Sex: 79 y.o., male  PCP: Olin Hauser, DO Referring Physician: Flora Lipps, MD  Reason for Consultation: Establishing goals of care and Psychosocial/spiritual support  HPI/Patient Profile: 79 y.o. 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  Presented from podiatry office for evaluation, started on IV Vanco, Rocephin, Flagyl.  Angiogram and revascularization on 7/12.  Foot gangrene did not seem to be improving so left B KA was performed on 7/15.  Mr. Gaut subsequently developed developed electrolyte derangements, altered mentation and worsening renal impairment.  He was intubated 7/25 and is now on continuous hemodialysis.  Clinical Assessment and Goals of Care: I have reviewed medical records including EPIC notes, labs and imaging, received report from bedside nursing staff, examined the patient.     Call to step daughter/HCPOA, Ginger Strickland to discuss diagnosis prognosis, GOC, EOL wishes, disposition and options.  No answer, left somewhat detailed voicemail.  Conference with attending, bedside nursing staff, transition of care team related to patient condition, needs, goals of care. PMT to follow.  [place], Ginger Strickland.    SUMMARY OF RECOMMENDATIONS   At this point, full scope/full code PMT to continue CODE STATUS discussions   Code Status/Advance Care  Planning:  Full code -Per chart Mr. Pottinger has written advanced directives requesting full scope/full code.  This document also names his stepdaughter, Johney Frame as his healthcare surrogate.  Symptom Management:   Per hospitalist/CCM, no additional needs at this time.  Palliative Prophylaxis:   Oral Care and Turn Reposition  Additional Recommendations (Limitations, Scope, Preferences):  Full Scope Treatment  Psycho-social/Spiritual:   Desire for further Chaplaincy support:no  Additional Recommendations: Caregiving  Support/Resources and Compassionate Wean Education  Prognosis:   Unable to determine, based on outcomes.  Guarded at this time.  Discharge Planning: To be determined, based on outcomes.      Primary Diagnoses: Present on Admission: . 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 ([place]) . Coronary artery disease . CKD (chronic kidney disease), stage III . Urinary retention   I have reviewed the medical record, interviewed the patient and family, and examined the patient. The following aspects are pertinent.  Past Medical History:  Diagnosis Date  . Carcinoma of skin   . Cauda equina compression ([place])   . Diabetic retinopathy ([place])   . GERD (gastroesophageal reflux disease)   . Hypertension    Social History   Socioeconomic History  . Marital status: Divorced    Spouse name: Not on file  . Number of children: Not  on file  . Years of education: Not on file  . Highest education level: Not on file  Occupational History  . Not on file  Tobacco Use  . Smoking status: Former Smoker    Years: 45.00    Types: Pipe    Quit date: 2016    Years since quitting: 5.5  . Smokeless tobacco: Former Network engineer and Sexual Activity  . Alcohol use: Yes    Alcohol/week: 0.0 standard drinks    Comment: occasional.  . Drug use: No  . Sexual activity: Not on file  Other Topics Concern  . Not on file  Social History Narrative  . Not on file   Social  Determinants of Health   Financial Resource Strain:   . Difficulty of Paying Living Expenses:   Food Insecurity:   . Worried About Charity fundraiser in the Last Year:   . Arboriculturist in the Last Year:   Transportation Needs:   . Film/video editor (Medical):   Marland Kitchen Lack of Transportation (Non-Medical):   Physical Activity:   . Days of Exercise per Week:   . Minutes of Exercise per Session:   Stress:   . Feeling of Stress :   Social Connections:   . Frequency of Communication with Friends and Family:   . Frequency of Social Gatherings with Friends and Family:   . Attends Religious Services:   . Active Member of Clubs or Organizations:   . Attends Archivist Meetings:   Marland Kitchen Marital Status:    Family History  Problem Relation Age of Onset  . Hypertension Sister   . Hyperlipidemia Sister    Scheduled Meds: . aspirin  81 mg Per Tube Daily  . chlorhexidine gluconate (MEDLINE KIT)  15 mL Mouth Rinse BID  . Chlorhexidine Gluconate Cloth  6 each Topical Daily  . docusate  100 mg Per Tube BID  . fluticasone  2 spray Each Nare Daily  . gabapentin  100 mg Oral QHS  . heparin  5,000 Units Subcutaneous Q8H  . insulin aspart  0-15 Units Subcutaneous TID WC  . insulin glargine  10 Units Subcutaneous QHS  . lidocaine  1 patch Transdermal Q24H  . loratadine  10 mg Per Tube Daily  . mouth rinse  15 mL Mouth Rinse 10 times per day  . metoCLOPramide (REGLAN) injection  10 mg Intravenous QID  . multivitamin with minerals  1 tablet Per Tube Daily  . pantoprazole (PROTONIX) IV  40 mg Intravenous BID  . polyethylene glycol  17 g Per Tube Daily  . scopolamine  1 patch Transdermal Q72H  . simvastatin  10 mg Per Tube QHS  . sodium chloride flush  3 mL Intravenous Once  . vitamin B-12  1,000 mcg Per Tube Daily   Continuous Infusions: .  prismasol BGK 4/2.5 500 mL/hr at 01/13/20 1814  .  prismasol BGK 4/2.5 500 mL/hr at 01/13/20 1745  . sodium chloride 250 mL (01/05/20 0616)  .  albumin human Stopped (01/14/20 1222)  . dexmedetomidine (PRECEDEX) IV infusion 0.7 mcg/kg/hr (01/14/20 1300)  . fentaNYL infusion INTRAVENOUS 100 mcg/hr (01/14/20 1300)  . milrinone 0.3 mcg/kg/min (01/14/20 1300)  . norepinephrine (LEVOPHED) Adult infusion 6 mcg/min (01/14/20 1300)  . prismasol BGK 4/2.5     PRN Meds:.sodium chloride, acetaminophen **OR** acetaminophen, alum & mag hydroxide-simeth, calcium carbonate, guaiFENesin-dextromethorphan, heparin, ondansetron **OR** ondansetron (ZOFRAN) IV, oxyCODONE, polyvinyl alcohol, promethazine, sodium phosphate Medications Prior to Admission:  Prior to Admission  medications   Medication Sig Start Date End Date Taking? Authorizing Provider  amLODipine (NORVASC) 10 MG tablet Take 1 tablet (10 mg total) by mouth daily. 05/25/16  Yes [name], MD  aspirin EC 81 MG tablet Take 81 mg by mouth daily.   Yes [provider]  atorvastatin (LIPITOR) 80 MG tablet Take 40 mg by mouth at bedtime.   Yes [provider]  calcium acetate (PHOSLO) 667 MG capsule Take 667 mg by mouth 2 (two) times daily with a meal.   Yes [provider]  glipiZIDE (GLUCOTROL) 5 MG tablet Take 2.5 mg by mouth 2 (two) times daily before a meal.   Yes [provider]  Insulin Glargine (LANTUS) 100 UNIT/ML Solostar Pen Inject 18 Units into the skin daily.    Yes [provider]  omeprazole (PRILOSEC) 20 MG capsule Take 1 capsule (20 mg total) by mouth daily. 04/12/16  Yes [name], MD  tamsulosin (FLOMAX) 0.4 MG CAPS capsule Take 1 capsule (0.4 mg total) by mouth daily. 08/01/15  Yes [name], MD  acetaminophen (TYLENOL) 500 MG tablet Take 1,000 mg by mouth every 6 (six) hours as needed for mild pain, fever or headache.    [provider]  fluticasone (FLONASE) 50 MCG/ACT nasal spray Place 2 sprays into both nostrils daily. Use for 4-6 weeks then stop and use seasonally or as needed. 02/13/18    Karamalegos, [name], DO  loratadine (CLARITIN) 10 MG tablet Take 1 tablet (10 mg total) by mouth daily. Use for 4-6 weeks then stop, and use as needed or seasonally 02/13/18   Olin Hauser, DO   Allergies  Allergen Reactions  . Ivp Dye [Iodinated Diagnostic Agents] Other (See Comments)    Reaction:  Unknown   . Other     Pain meds by mouth FA dye Unknown reaction  . Codeine Nausea And Vomiting and Rash   Review of Systems  Unable to perform ROS: Intubated    Physical Exam Vitals and nursing note reviewed.  Constitutional:      Appearance: He is ill-appearing.  HENT:     Head: Normocephalic and atraumatic.  Cardiovascular:     Rate and Rhythm: Normal rate.  Pulmonary:     Comments: Intubated/ventilated Musculoskeletal:        General: No swelling.  Skin:    General: Skin is warm and dry.     Coloration: Skin is pale.  Neurological:     Comments: Intubated/ventilated     Vital Signs: BP (!) 109/56   Pulse 72   Temp 98.1 F (36.7 C) (Rectal)   Resp (!) 25   Ht 6' 2" (1.88 m)   Wt (!) 92.8 kg   SpO2 90%   BMI 26.27 kg/m  Pain Scale: Faces POSS *See Group Information*: 1-Acceptable,Awake and alert Pain Score: 0-No pain   SpO2: SpO2: 90 % O2 Device:SpO2: 90 % O2 Flow Rate: .O2 Flow Rate (L/min): 2 L/min  IO: Intake/output summary:   Intake/Output Summary (Last 24 hours) at 01/14/2020 1348 Last data filed at 01/14/2020 1300 Gross per 24 hour  Intake 1235.2 ml  Output 2243 ml  Net -1007.8 ml    LBM: Last BM Date: 01/11/20 Baseline Weight: Weight: 75.8 kg Most recent weight: Weight: (!) 92.8 kg     Palliative Assessment/Data:   Flowsheet Rows     Most Recent Value  Intake Tab  Referral Department Critical care  Unit at Time of Referral ICU  Palliative Care Primary Diagnosis Sepsis/Infectious Disease  Date Notified 01/11/20  Palliative Care Type New Palliative care  Date of Admission 01/16/2020  Date first seen by Palliative Care  01/14/20  # of days Palliative referral response time 3 Day(s)  # of days IP prior to Palliative referral 16  Clinical Assessment  Palliative Performance Scale Score 10%  Pain Max last 24 hours Not able to report  Pain Min Last 24 hours Not able to report  Dyspnea Max Last 24 Hours Not able to report  Dyspnea Min Last 24 hours Not able to report  Psychosocial & Spiritual Assessment  Palliative Care Outcomes      Time In: 1320 Time Out: 1350 Time Total: 30 minutes  Greater than 50%  of this time was spent counseling and coordinating care related to the above assessment and plan.  Signed by: Drue Novel, NP   Please contact Palliative Medicine Team phone at [phone] for questions and concerns.  For individual provider: See Shea Evans

## 2020-01-14 NOTE — Progress Notes (Signed)
Progress Note  Patient Name: [name] Date of Encounter: 01/14/2020  Primary Cardiologist: [place] intubated and sedated. On milrinone and Levophed along the CRRT. Renal function improving this morning with a BUN/SCr 112/4.19-->73/2.87. Sodium improving 126-->133. Potassium improved 5.2-->4.5. HGB 8.3-->7.6. Has had worseningabdominal distension and a few episodes of bilious vomitus. He remains critically ill. Family wishes to pursue current level of care. He remains a full code.   Inpatient Medications    Scheduled Meds: . aspirin  81 mg Per Tube Daily  . chlorhexidine gluconate (MEDLINE KIT)  15 mL Mouth Rinse BID  . Chlorhexidine Gluconate Cloth  6 each Topical Daily  . docusate  100 mg Per Tube BID  . ferrous sulfate  325 mg Oral Q breakfast  . fluticasone  2 spray Each Nare Daily  . gabapentin  100 mg Oral QHS  . heparin  5,000 Units Subcutaneous Q8H  . insulin aspart  0-15 Units Subcutaneous TID WC  . insulin glargine  10 Units Subcutaneous QHS  . lidocaine  1 patch Transdermal Q24H  . loratadine  10 mg Per Tube Daily  . mouth rinse  15 mL Mouth Rinse 10 times per day  . metoCLOPramide (REGLAN) injection  10 mg Intravenous QID  . multivitamin with minerals  1 tablet Per Tube Daily  . pantoprazole (PROTONIX) IV  40 mg Intravenous BID  . patiromer  16.8 g Oral Daily  . polyethylene glycol  17 g Per Tube Daily  . scopolamine  1 patch Transdermal Q72H  . simethicone  80 mg Per Tube QID  . simvastatin  10 mg Per Tube QHS  . sodium chloride flush  3 mL Intravenous Once  . tamsulosin  0.4 mg Oral Daily  . vitamin B-12  1,000 mcg Per Tube Daily   Continuous Infusions: .  prismasol BGK 4/2.5 500 mL/hr at 01/13/20 1814  .  prismasol BGK 4/2.5 500 mL/hr at 01/13/20 1745  . sodium chloride 250 mL (01/05/20 0616)  . dexmedetomidine (PRECEDEX) IV infusion 0.6 mcg/kg/hr (01/14/20 0900)  . fentaNYL infusion INTRAVENOUS 75 mcg/hr (01/14/20  0900)  . milrinone 0.3 mcg/kg/min (01/14/20 0900)  . norepinephrine (LEVOPHED) Adult infusion 6 mcg/min (01/14/20 0900)  . prismasol BGK 4/2.5     PRN Meds: sodium chloride, acetaminophen **OR** acetaminophen, alum & mag hydroxide-simeth, calcium carbonate, guaiFENesin-dextromethorphan, heparin, ondansetron **OR** ondansetron (ZOFRAN) IV, oxyCODONE, polyvinyl alcohol, promethazine, sodium phosphate   Vital Signs    Vitals:   01/14/20 0700 01/14/20 0735 01/14/20 0800 01/14/20 0900  BP: (!) 111/53  (!) 108/56 (!) 99/61  Pulse: 67  65 70  Resp: '17  16 16  '$ Temp: 98.1 F (36.7 C)  97.9 F (36.6 C) 97.7 F (36.5 C)  TempSrc:      SpO2: 99% 95% 100% 99%  Weight:      Height:        Intake/Output Summary (Last 24 hours) at 01/14/2020 0953 Last data filed at 01/14/2020 0900 Gross per 24 hour  Intake 1109.77 ml  Output 1635 ml  Net -525.23 ml   Filed Weights   01/06/2020 1533 01/11/20 1800 01/14/20 0322  Weight: 75.8 kg (!) 95.9 kg (!) 92.8 kg    Telemetry    V-paced, 70s bpm - Personally Reviewed  ECG    No new tracings - Personally Reviewed  Physical Exam   GEN: Critically ill appearing.   Neck: JVD difficult to assess with respiratory support apparatus. Cardiac: RRR, I/VI  systolic murmur, no rubs, or gallops.  Respiratory: Diminished breath sounds bilaterally. Intubated.  GI: Soft, nontender, non-distended.   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 Neuro:  Intubated and sedated.  Psych: Intubated and sedated.  Labs    Chemistry Recent Labs  Lab 01/11/20 [phone] 01/11/20 0443 01/12/20 0529 01/13/20 0723 01/14/20 0509  NA 127*   < > 128* 126* 133*  K 5.9*   < > 5.0 5.2* 4.5  CL 92*   < > 94* 93* 100  CO2 20*   < > 18* 18* 26  GLUCOSE 296*   < > 159* 150* 129*  BUN 98*   < > 99* 112* 73*  CREATININE 3.55*   < > 3.83* 4.19* 2.87*  CALCIUM 8.0*   < > 8.2* 8.1* 7.3*  PROT 6.2*  --   --  5.9*  --   ALBUMIN 2.5*  --   --  3.3* 3.5  AST 158*  --   --  50*  --   ALT 99*  --    --  60*  --   ALKPHOS 239*  --   --  162*  --   BILITOT 1.3*  --   --  1.5*  --   GFRNONAA 15*   < > 14* 12* 20*  GFRAA 18*   < > 16* 14* 23*  ANIONGAP 15   < > 16* 15 7   < > = values in this interval not displayed.     Hematology Recent Labs  Lab 01/12/20 0529 01/13/20 0723 01/14/20 0526  WBC 13.1* 10.6* 7.2  RBC 2.97* 3.00* 2.83*  HGB 8.1* 8.3* 7.6*  HCT 24.8* 24.9* 24.4*  MCV 83.5 83.0 86.2  MCH 27.3 27.7 26.9  MCHC 32.7 33.3 31.1  RDW 21.6* 21.9* 21.6*  PLT 440* 413* 310    Cardiac EnzymesNo results for input(s): TROPONINI in the last 168 hours. No results for input(s): TROPIPOC in the last 168 hours.   BNPNo results for input(s): BNP, PROBNP in the last 168 hours.   DDimer No results for input(s): DDIMER in the last 168 hours.   Radiology    DG Abd 1 View  Result Date: 01/13/2020 IMPRESSION: The stomach is gas distended. The small bowel and colon are gas-filled, however nondistended, with gas present to the rectum. No significant burden of stool. Radiographic findings are not significant changed compared to prior examination dated 01/09/2020. Electronically Signed   By: Eddie Candle M.D.   On: 01/13/2020 10:07   DG Chest Port 1 View  Result Date: 01/13/2020 IMPRESSION: Line and tube positions as above. Small RIGHT pleural effusion, bibasilar atelectasis and perihilar infiltrates question edema versus infection. Electronically Signed   By: Lavonia Dana M.D.   On: 01/13/2020 18:10   DG Abd Portable 1V  Result Date: 01/13/2020 IMPRESSION: 1. Tip of enteric tube in the proximal body of the stomach. 2. Persistent gaseous distension of the stomach and small bowel, concerning for bowel obstruction. Electronically Signed   By: Vinnie Langton M.D.   On: 01/13/2020 18:09    Cardiac Studies   2D echo 01/10/2020: 1. Left ventricular ejection fraction, by estimation, is 30 to 35%. The  left ventricle has moderately decreased function. The left ventricle  demonstrates  global hypokinesis. The left ventricular internal cavity size  was mildly dilated. Left ventricular  diastolic parameters were normal.  2. Right ventricular systolic function was not well visualized. The right  ventricular size is mildly enlarged. There is moderately  elevated  pulmonary artery systolic pressure.  3. Left atrial size was mildly dilated.  4. The mitral valve is grossly normal. Mild mitral valve regurgitation.  5. Tricuspid valve regurgitation is mild to moderate.  6. The aortic valve is grossly normal. Aortic valve regurgitation is  trivial.   Patient Profile     79 y.o. male with history of CAD s/p CABG x 7 in 2005, reported symptomatic bradycardia s/p PPMearlier this year at the [place], CKD stage III, PAD, IDDM, anemia of chronic disease, HTN, HLD, BPH/urinary retentionwho is being seen today for the evaluation of cardiomyopathy.  Assessment & Plan    1. HFrEF/cardiorenal syndrome.cardiogenic shock: -Chronicity uncertain -Presumably ischemic in etiology given the patient's prior history of cardiac bypass surgery -Echo this admission shows an EF of 20 to 25% by [facility] with severe TR and elevated right heart pressures -He is significantly volume overloaded -Transferred to the ICU on 7/23 and placed on milrinone gtt to assist with augmentation of diuresis  -SCr improving this morning on milrinone and CRRT  -On milrinone and Levophed  -Palliative care on board with family wishes to continue current level of care -Relative hypotension and decompensation preclude escalation of GDMT at this time -Pending clinical course, would need R/LHC prior to discharge  -Unable to transfer at this time due to acute illness  2. Acute hypoxic and hypercapneic respiratory failure with metabolic encephalopathy with multiorgan failure: -Intubated and sedated -Vent support per PCCM -Remains critically ill with a guarded prognosis   3.CAD status post CABG: -He has denied any  symptoms of angina -ASA  4.Presumed symptomatic bradycardia: -Post PPM functioning normally on telemetry pacing rate 90's   5.PAD with left foot diabetic ulcer with cellulitis and gangrene: -Status post BKA this admission -Management per IM and vascular surgery  6.Acute on CKD stage III with hyperkalemia: -Likely ATN exacerbated by cardiorenal syndrome -Renal function improving on the morning of 7/26 on milrinone and CRRT -Nephrology on board -Milrinone as outlined above  7.HTN: -BP soft -Continue to hold PTA amlodipine in the setting of relative hypotension and cardiomyopathy  8.HLD: -PTA statin  9. Anemia of chronic disease: -Low, though stable -Management per primary    For questions or updates, please contact [facility] Please consult [email or web address] for contact info under Cardiology/STEMI.    Signed, Christell Faith, PA-C [facility] Pager: [phone] 01/14/2020, 9:53 AM

## 2020-01-14 NOTE — Progress Notes (Signed)
Filter on CRRT machine clotted.  New filter to be hung when delivered by supply chain.

## 2020-01-14 NOTE — Procedures (Signed)
Endotracheal Intubation: Patient required placement of an artificial airway secondary to Respiratory Failure  Consent: Emergent CUFF LEAK  Hand washing performed prior to starting the procedure.   Medications administered for sedation prior to procedure:  Midazolam 4 mg IV,  Vecuronium 10 mg IV   A time out procedure was called and correct patient, name, & ID confirmed. Needed supplies and equipment were assembled and checked to include ETT, 10 ml syringe, Glidescope, Mac and Miller blades, suction, oxygen and bag mask valve, end tidal CO2 monitor.   Patient was positioned to align the mouth and pharynx to facilitate visualization of the glottis.   Heart rate, SpO2 and blood pressure was continuously monitored during the procedure. Pre-oxygenation was conducted prior to intubation and endotracheal tube was placed through the vocal cords into the trachea.     The artificial airway was placed under direct visualization via glidescope route using a 8.0 ETT on the first attempt.  ETT was secured at 23 cm mark.  Placement was confirmed by auscuitation of lungs with good breath sounds bilaterally and no stomach sounds.  Condensation was noted on endotracheal tube.   Pulse ox 98%.  CO2 detector in place with appropriate color change.   Complications: None .   Operator: Saher Davee.   Chest radiograph ordered and pending.     Corrin Parker, M.D.  [facility]  Medical Director [place] Director [facility] Cardio-Pulmonary Department

## 2020-01-14 NOTE — Progress Notes (Signed)
[facility]  ROUNDING NOTE   Subjective:   Patient remains critically ill, vent dependent, pressors and sedated Precedex Vent: fio2 21 % requiring pressors: levophed CRRT started 7/25; tolerating fair   Objective:  Vital signs in last 24 hours:  Temp:  [91 F (32.8 C)-98.1 F (36.7 C)] 97.7 F (36.5 C) (07/26 0900) Pulse Rate:  [65-102] 69 (07/26 1000) Resp:  [14-29] 21 (07/26 1000) BP: (88-121)/(47-95) 97/54 (07/26 1000) SpO2:  [91 %-100 %] 99 % (07/26 1000) FiO2 (%):  [21 %-75 %] 21 % (07/26 1000) Weight:  [92.8 kg] 92.8 kg (07/26 0322)  Weight change:  Filed Weights   12/24/2019 1533 01/11/20 1800 01/14/20 0322  Weight: 75.8 kg (!) 95.9 kg (!) 92.8 kg    Intake/Output: I/O last 3 completed shifts: In: 1218.6 [I.V.:945.2; IV Piggyback:273.4] Out: 4259 [Urine:370; Emesis/NG output:150; Other:1225]   Intake/Output this shift:  Total I/O In: 551.2 [I.V.:551.2] Out: 155 [Urine:25; Other:130]   Physical Exam: General:  Critically ill, laying in the bed, warming blanket in place  HEENT  ETT, OGT in place  Pulm/lungs  Vent assisted  CVS/Heart  irregular rhythm, no rub or gallop, paced  Abdomen:   Soft,   Extremities:  ++ dependent peripheral edema, left BKA  Neurologic:  sedated  Skin:  No acute rashes  Foley in place Rt IJ remp cath    Basic Metabolic Panel: Recent Labs  Lab 01/09/20 0446 01/10/20 0526 01/10/20 2146 01/10/20 2146 01/11/20 0443 01/11/20 0443 01/12/20 0529 01/13/20 0723 01/14/20 0509  NA 127*   < > 125*  --  127*  --  128* 126* 133*  K 4.9   < > 5.4*  --  5.9*  --  5.0 5.2* 4.5  CL 95*   < > 94*  --  92*  --  94* 93* 100  CO2 19*   < > 20*  --  20*  --  18* 18* 26  GLUCOSE 195*   < > 239*  --  296*  --  159* 150* 129*  BUN 93*   < > 98*  --  98*  --  99* 112* 73*  CREATININE 2.81*   < > 3.43*  --  3.55*  --  3.83* 4.19* 2.87*  CALCIUM 7.7*   < > 7.8*   < > 8.0*   < > 8.2* 8.1* 7.3*  MG 2.1  --  2.2  --   --   --   --   2.4 2.3  PHOS  --   --   --   --   --   --   --  6.8* 4.9*   < > = values in this interval not displayed.    Liver Function Tests: Recent Labs  Lab 01/11/20 0443 01/13/20 0723 01/14/20 0509  AST 158* 50*  --   ALT 99* 60*  --   ALKPHOS 239* 162*  --   BILITOT 1.3* 1.5*  --   PROT 6.2* 5.9*  --   ALBUMIN 2.5* 3.3* 3.5   No results for input(s): LIPASE, AMYLASE in the last 168 hours. No results for input(s): AMMONIA in the last 168 hours.  CBC: Recent Labs  Lab 01/09/20 0446 01/11/20 0443 01/12/20 0529 01/13/20 0723 01/14/20 0526  WBC 12.8* 14.4* 13.1* 10.6* 7.2  NEUTROABS 9.9*  --   --  9.2*  --   HGB 8.5* 8.3* 8.1* 8.3* 7.6*  HCT 25.7* 26.7* 24.8* 24.9* 24.4*  MCV 81.3 85.0 83.5  83.0 86.2  PLT 570* 550* 440* 413* 310    Cardiac Enzymes: No results for input(s): CKTOTAL, CKMB, CKMBINDEX, TROPONINI in the last 168 hours.  BNP: Invalid input(s): POCBNP  CBG: Recent Labs  Lab 01/13/20 0735 01/13/20 1120 01/13/20 1556 01/13/20 2121 01/14/20 0729  GLUCAP 140* 161* 168* 126* 111*    Microbiology: Results for orders placed or performed during the hospital encounter of 01/09/2020  Blood Cultures x 2 sites     Status: None   Collection Time: 01/11/2020  8:48 PM   Specimen: BLOOD  Result Value Ref Range Status   Specimen Description BLOOD BLOOD LEFT FOREARM  Final   Special Requests   Final    BOTTLES DRAWN AEROBIC AND ANAEROBIC Blood Culture adequate volume   Culture   Final    NO GROWTH 5 DAYS Performed at [facility], [address] [place], [place] [address]    Report Status 12/27/2019 FINAL  Final  Culture, blood (Routine X 2) w Reflex to ID Panel     Status: None   Collection Time: 12/27/19  7:01 PM   Specimen: BLOOD  Result Value Ref Range Status   Specimen Description BLOOD BLOOD RIGHT HAND  Final   Special Requests   Final    BOTTLES DRAWN AEROBIC AND ANAEROBIC Blood Culture adequate volume   Culture   Final    NO GROWTH 5  DAYS Performed at [facility], [address] [place], [place] [address]    Report Status 01/16/2020 FINAL  Final  SARS Coronavirus 2 by RT PCR (hospital order, performed in [facility] hospital lab) Nasopharyngeal Nasopharyngeal Swab     Status: None   Collection Time: 12/27/19  8:45 PM   Specimen: Nasopharyngeal Swab  Result Value Ref Range Status   SARS Coronavirus 2 NEGATIVE NEGATIVE Final    Comment: (NOTE) SARS-CoV-2 target nucleic acids are NOT DETECTED.  The SARS-CoV-2 RNA is generally detectable in upper and lower respiratory specimens during the acute phase of infection. The lowest concentration of SARS-CoV-2 viral copies this assay can detect is 250 copies / mL. A negative result does not preclude SARS-CoV-2 infection and should not be used as the sole basis for treatment or other patient management decisions.  A negative result may occur with improper specimen collection / handling, submission of specimen other than nasopharyngeal swab, presence of viral mutation(s) within the areas targeted by this assay, and inadequate number of viral copies (<250 copies / mL). A negative result must be combined with clinical observations, patient history, and epidemiological information.  Fact Sheet for Patients:   StrictlyIdeas.no  Fact Sheet for Healthcare Providers: BankingDealers.co.za  This test is not yet approved or  cleared by the [place] FDA and has been authorized for detection and/or diagnosis of SARS-CoV-2 by FDA under an Emergency Use Authorization (EUA).  This EUA will remain in effect (meaning this test can be used) for the duration of the COVID-19 declaration under Section 564(b)(1) of the Act, 21 U.S.C. section 360bbb-3(b)(1), unless the authorization is terminated or revoked sooner.  Performed at [facility], [address] [place], [place] [address]   MRSA PCR Screening     Status:  None   Collection Time: 12/27/19  8:45 PM   Specimen: Nasopharyngeal  Result Value Ref Range Status   MRSA by PCR NEGATIVE NEGATIVE Final    Comment:        The GeneXpert MRSA Assay (FDA approved for NASAL specimens only), is one component  of a comprehensive MRSA colonization surveillance program. It is not intended to diagnose MRSA infection nor to guide or monitor treatment for MRSA infections. Performed at [facility], [address], [place], [place] [address]   Urine Culture     Status: Abnormal   Collection Time: 01/08/20 10:45 AM   Specimen: Urine, Random  Result Value Ref Range Status   Specimen Description   Final    URINE, RANDOM Performed at [facility], [address], [place], [place] [address]    Special Requests   Final    NONE Performed at [facility], [address] [place], [place] [address]    Culture >=100,000 COLONIES/mL YEAST (A)  Final   Report Status 01/09/2020 FINAL  Final    Coagulation Studies: No results for input(s): LABPROT, INR in the last 72 hours.  Urinalysis: No results for input(s): COLORURINE, LABSPEC, PHURINE, GLUCOSEU, HGBUR, BILIRUBINUR, KETONESUR, PROTEINUR, UROBILINOGEN, NITRITE, LEUKOCYTESUR in the last 72 hours.  Invalid input(s): APPERANCEUR    Imaging: DG Abd 1 View  Result Date: 01/13/2020 CLINICAL DATA:  Small-bowel obstruction EXAM: ABDOMEN - 1 VIEW COMPARISON:  01/09/2020 FINDINGS: The stomach is gas distended. The small bowel and colon are gas-filled, however nondistended, with gas present to the rectum. No significant burden of stool. No free air in the abdomen on supine radiographs. Vascular calcinosis. IMPRESSION: The stomach is gas distended. The small bowel and colon are gas-filled, however nondistended, with gas present to the rectum. No significant burden of stool. Radiographic findings are not significant changed compared to prior examination dated 01/09/2020. Electronically Signed    By: Eddie Candle M.D.   On: 01/13/2020 10:07   DG Chest Port 1 View  Result Date: 01/13/2020 CLINICAL DATA:  Intubation, orogastric tube placement EXAM: PORTABLE CHEST 1 VIEW COMPARISON:  Portable exam 1600 hours compared to 08/11/2015 FINDINGS: Rotated to the RIGHT. Tip of endotracheal tube projects 4.6 cm above carina. Nasogastric tube extends into stomach. RIGHT jugular central venous catheter with tip projecting over cavoatrial junction. LEFT subclavian transvenous pacemaker with leads projecting over RIGHT atrium, RIGHT ventricle, and coronary sinus. Upper normal size of cardiac silhouette. Mediastinal contours and pulmonary vascularity normal for degree of rotation. Bibasilar atelectasis and small RIGHT pleural effusion. Mild perihilar infiltrates which could represent edema or infection. No pneumothorax. Gaseous distention of stomach. IMPRESSION: Line and tube positions as above. Small RIGHT pleural effusion, bibasilar atelectasis and perihilar infiltrates question edema versus infection. Electronically Signed   By: Lavonia Dana M.D.   On: 01/13/2020 18:10   DG Abd Portable 1V  Result Date: 01/13/2020 CLINICAL DATA:  79 year old male status post orogastric tube placement. EXAM: PORTABLE ABDOMEN - 1 VIEW COMPARISON:  01/13/2020. FINDINGS: Interval placement of enteric tube with tip projecting over the proximal body of the stomach. There is persistent gaseous distension of the stomach. Several dilated loops of gas-filled small bowel are again noted measuring up to 4 cm in the left side of the abdomen. Pacemaker leads projecting over the inferior aspect of the heart. IMPRESSION: 1. Tip of enteric tube in the proximal body of the stomach. 2. Persistent gaseous distension of the stomach and small bowel, concerning for bowel obstruction. Electronically Signed   By: Vinnie Langton M.D.   On: 01/13/2020 18:09     Medications:     prismasol BGK 4/2.5 500 mL/hr at 01/13/20 1814    prismasol BGK  4/2.5 500 mL/hr at 01/13/20 1745   sodium chloride 250 mL (01/05/20 0616)  albumin human     dexmedetomidine (PRECEDEX) IV infusion 0.6 mcg/kg/hr (01/14/20 1000)   fentaNYL infusion INTRAVENOUS 75 mcg/hr (01/14/20 1000)   milrinone 0.3 mcg/kg/min (01/14/20 1000)   norepinephrine (LEVOPHED) Adult infusion 6 mcg/min (01/14/20 1000)   prismasol BGK 4/2.5      aspirin  81 mg Per Tube Daily   chlorhexidine gluconate (MEDLINE KIT)  15 mL Mouth Rinse BID   Chlorhexidine Gluconate Cloth  6 each Topical Daily   docusate  100 mg Per Tube BID   ferrous sulfate  325 mg Oral Q breakfast   fluticasone  2 spray Each Nare Daily   gabapentin  100 mg Oral QHS   heparin  5,000 Units Subcutaneous Q8H   insulin aspart  0-15 Units Subcutaneous TID WC   insulin glargine  10 Units Subcutaneous QHS   lidocaine  1 patch Transdermal Q24H   loratadine  10 mg Per Tube Daily   mouth rinse  15 mL Mouth Rinse 10 times per day   metoCLOPramide (REGLAN) injection  10 mg Intravenous QID   multivitamin with minerals  1 tablet Per Tube Daily   pantoprazole (PROTONIX) IV  40 mg Intravenous BID   patiromer  16.8 g Oral Daily   polyethylene glycol  17 g Per Tube Daily   scopolamine  1 patch Transdermal Q72H   simethicone  80 mg Per Tube QID   simvastatin  10 mg Per Tube QHS   sodium chloride flush  3 mL Intravenous Once   tamsulosin  0.4 mg Oral Daily   vitamin B-12  1,000 mcg Per Tube Daily   sodium chloride, acetaminophen **OR** acetaminophen, alum & mag hydroxide-simeth, calcium carbonate, guaiFENesin-dextromethorphan, heparin, ondansetron **OR** ondansetron (ZOFRAN) IV, oxyCODONE, polyvinyl alcohol, promethazine, sodium phosphate  Assessment/ Plan:   [name] is a 79 y.o. 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 Lucky Cowboy  1. Acute renal failure with hyperkalemia and metabolic acidosis on chronic kidney diease stage IV with baseline creatinine of 2.24, GFR of 26 on 02/11/2018. Chronic kidney disease secondary to diabetic nephropathy, hypertension and obstructive uropathy.  No IV contrast. Renal ultrasound with no obstruction.   Acute renal failure seems to be related to ATN from hemodynamic instability -Pt now oliguric and Cr rising, also still on milrinone.  Family desiring aggressive care.  Therefore CRRT initiated on 7/25 - d/c furosemide drip - UF 100 cc/hr - add albumin for oncotic support.  2. Acute resp failure - vent dependent  3. Hyponatremia Hypervolemic Adjust CRRT volume removal  4. Chronic systolic CHF and Generalized edema EF 25-30%     LOS: [place] 7/26/202110:32 AM

## 2020-01-14 NOTE — Progress Notes (Signed)
CRRT restarted with new filter as per policy and procedure.

## 2020-01-14 NOTE — Progress Notes (Addendum)
Shift summary:  - Assumed patient's care at 1900 hrs.   - Patient remains intubated, sedated, on CRRT, requiring vasopressors.   - Upon MN assessment, patient's abdomen was noted to be more firm than earlier. OGT was placed to LIWS and 200 mL green fluid was immediately evacuated. Abdomen noticeably softer following decompression.   - Rectal temp probe removed (no longer necessary).

## 2020-01-14 NOTE — Progress Notes (Signed)
ETT replaced by Dr. Mortimer Fries. 8.0, 26 at lip

## 2020-01-15 ENCOUNTER — Other Ambulatory Visit: Payer: Self-pay

## 2020-01-15 DIAGNOSIS — Z794 Long term (current) use of insulin: Secondary | ICD-10-CM

## 2020-01-15 DIAGNOSIS — L97509 Non-pressure chronic ulcer of other part of unspecified foot with unspecified severity: Secondary | ICD-10-CM

## 2020-01-15 DIAGNOSIS — Z89512 Acquired absence of left leg below knee: Secondary | ICD-10-CM

## 2020-01-15 DIAGNOSIS — N1832 Chronic kidney disease, stage 3b: Secondary | ICD-10-CM

## 2020-01-15 DIAGNOSIS — Z7189 Other specified counseling: Secondary | ICD-10-CM | POA: Diagnosis not present

## 2020-01-15 DIAGNOSIS — E11621 Type 2 diabetes mellitus with foot ulcer: Secondary | ICD-10-CM

## 2020-01-15 DIAGNOSIS — Z515 Encounter for palliative care: Secondary | ICD-10-CM

## 2020-01-15 DIAGNOSIS — N179 Acute kidney failure, unspecified: Secondary | ICD-10-CM | POA: Diagnosis not present

## 2020-01-15 LAB — PROTEIN ELECTROPHORESIS, SERUM
A/G Ratio: 0.8 (ref 0.7–1.7)
Albumin ELP: 2.4 g/dL — ABNORMAL LOW (ref 2.9–4.4)
Alpha-1-Globulin: 0.4 g/dL (ref 0.0–0.4)
Alpha-2-Globulin: 0.8 g/dL (ref 0.4–1.0)
Beta Globulin: 0.6 g/dL — ABNORMAL LOW (ref 0.7–1.3)
Gamma Globulin: 1.1 g/dL (ref 0.4–1.8)
Globulin, Total: 2.9 g/dL (ref 2.2–3.9)
Total Protein ELP: 5.3 g/dL — ABNORMAL LOW (ref 6.0–8.5)

## 2020-01-15 LAB — RENAL FUNCTION PANEL
Albumin: 3.8 g/dL (ref 3.5–5.0)
Albumin: 3.9 g/dL (ref 3.5–5.0)
Anion gap: 11 (ref 5–15)
Anion gap: 6 (ref 5–15)
BUN: 32 mg/dL — ABNORMAL HIGH (ref 8–23)
BUN: 22 mg/dL (ref 8–23)
CO2: 23 mmol/L (ref 22–32)
CO2: 25 mmol/L (ref 22–32)
Calcium: 7.9 mg/dL — ABNORMAL LOW (ref 8.9–10.3)
Calcium: 8.1 mg/dL — ABNORMAL LOW (ref 8.9–10.3)
Chloride: 101 mmol/L (ref 98–111)
Chloride: 102 mmol/L (ref 98–111)
Creatinine, Ser: 1.65 mg/dL — ABNORMAL HIGH (ref 0.61–1.24)
Creatinine, Ser: 1.31 mg/dL — ABNORMAL HIGH (ref 0.61–1.24)
GFR calc Af Amer: 44 mL/min — ABNORMAL LOW (ref >60)
GFR calc Af Amer: 59 mL/min — ABNORMAL LOW (ref >60)
GFR calc non Af Amer: 38 mL/min — ABNORMAL LOW (ref >60)
GFR calc non Af Amer: 51 mL/min — ABNORMAL LOW (ref >60)
Glucose, Bld: 112 mg/dL — ABNORMAL HIGH (ref 70–99)
Glucose, Bld: 96 mg/dL (ref 70–99)
Phosphorus: 2.9 mg/dL (ref 2.5–4.6)
Phosphorus: 2.3 mg/dL — ABNORMAL LOW (ref 2.5–4.6)
Potassium: 4.2 mmol/L (ref 3.5–5.1)
Potassium: 4.1 mmol/L (ref 3.5–5.1)
Sodium: 135 mmol/L (ref 135–145)
Sodium: 133 mmol/L — ABNORMAL LOW (ref 135–145)

## 2020-01-15 LAB — GLUCOSE, CAPILLARY
Glucose-Capillary: 104 mg/dL — ABNORMAL HIGH (ref 70–99)
Glucose-Capillary: 112 mg/dL — ABNORMAL HIGH (ref 70–99)
Glucose-Capillary: 87 mg/dL (ref 70–99)
Glucose-Capillary: 88 mg/dL (ref 70–99)
Glucose-Capillary: 89 mg/dL (ref 70–99)
Glucose-Capillary: 97 mg/dL (ref 70–99)

## 2020-01-15 LAB — CBC
HCT: 24.1 % — ABNORMAL LOW (ref 39.0–52.0)
Hemoglobin: 7.4 g/dL — ABNORMAL LOW (ref 13.0–17.0)
MCH: 27 pg (ref 26.0–34.0)
MCHC: 30.7 g/dL (ref 30.0–36.0)
MCV: 88 fL (ref 80.0–100.0)
Platelets: 194 10*3/uL (ref 150–400)
RBC: 2.74 MIL/uL — ABNORMAL LOW (ref 4.22–5.81)
RDW: 22 % — ABNORMAL HIGH (ref 11.5–15.5)
WBC: 11.2 10*3/uL — ABNORMAL HIGH (ref 4.0–10.5)
nRBC: 0.2 % (ref 0.0–0.2)

## 2020-01-15 LAB — KAPPA/LAMBDA LIGHT CHAINS
Kappa free light chain: 122.9 mg/L — ABNORMAL HIGH (ref 3.3–19.4)
Kappa, lambda light chain ratio: 1.16 (ref 0.26–1.65)
Lambda free light chains: 106.4 mg/L — ABNORMAL HIGH (ref 5.7–26.3)

## 2020-01-15 LAB — MAGNESIUM: Magnesium: 2.6 mg/dL — ABNORMAL HIGH (ref 1.7–2.4)

## 2020-01-15 MED ORDER — STERILE WATER FOR INJECTION IJ SOLN
INTRAMUSCULAR | Status: AC
  Filled 2020-01-15: qty 10

## 2020-01-15 MED ORDER — MIDAZOLAM HCL 2 MG/2ML IJ SOLN
1.0000 mg | INTRAMUSCULAR | Status: DC | PRN
  Filled 2020-01-15: qty 2

## 2020-01-15 MED ORDER — VITAL AF 1.2 CAL PO LIQD
1000.0000 mL | ORAL | Status: DC
  Administered 2020-01-15: 1000 mL

## 2020-01-15 MED ORDER — VITAL HIGH PROTEIN PO LIQD: 1000.0000 mL | ORAL | Status: DC

## 2020-01-15 MED ORDER — MIDAZOLAM HCL 2 MG/2ML IJ SOLN
1.0000 mg | INTRAMUSCULAR | Status: DC | PRN
  Administered 2020-01-15: 1 mg via INTRAVENOUS

## 2020-01-15 MED ORDER — PROPOFOL 1000 MG/100ML IV EMUL
INTRAVENOUS | Status: AC
  Administered 2020-01-16: 30 ug/kg/min via INTRAVENOUS
  Filled 2020-01-15: qty 100

## 2020-01-15 MED ORDER — ARTIFICIAL TEARS OPHTHALMIC OINT
TOPICAL_OINTMENT | Freq: Two times a day (BID) | OPHTHALMIC | Status: DC
  Filled 2020-01-15 (×2): qty 3.5

## 2020-01-15 MED ORDER — B COMPLEX-C PO TABS
1.0000 | ORAL_TABLET | Freq: Every day | ORAL | Status: DC
  Administered 2020-01-15 – 2020-01-16 (×2): 1
  Filled 2020-01-15 (×3): qty 1

## 2020-01-15 NOTE — Progress Notes (Signed)
The Clinical status was relayed to family in detail yesterday Daughters and son  Updated and notified of patients medical condition.  Patient with Progressive multiorgan failure with very low chance of meaningful recovery despite all aggressive and optimal medical therapy.  Patient is in the dying  Process associated with suffering.  Family understands the situation.  They have consented and agreed to DNR/DNI   Family are satisfied with Plan of action and management. All questions answered     Corrin Parker, M.D.  [facility]  Medical Director [place] Director [facility] Cardio-Pulmonary Department

## 2020-01-15 NOTE — Progress Notes (Signed)
Palliative:   [name] remains critically ill.  He is intubated/ventilated, sedated and on vasopressors.  Critical care attending goals of care discussions today with family.  [name] is now DNR, but continue full scope otherwise.  Call to stepdaughter/HC POA, [name].  Antonio shares that she would have like for palliative medicine team to talk with [name] prior to intubation, to hear his wishes, but understands that this was not possible.  I shared that it would have been beneficial to hear [name]'s wishes, and ask if he wanted to set limits on life support.  Antonio and I talked about the treatment plan in detail including, but not limited to, ventilation support, continuous IV medicines for heart and blood pressure support, continuous hemodialysis.  We talked about time for outcomes and meaningful improvements.  Antonio states that prior to the acute decline that necessitated life support [name] still believe that he would be able to get up, get dressed, and go to [place] to meet with his friends.  We talked about what meaningful improvements would look like, and I asked Antonio to consider if [name] would want to continue in this manner if he were never able to meet with his friends at [place] again.  Antonio states that their mother has been recently diagnosed with middle stages of memory loss.  She states that they are keeping their mother updated on [name]'s conditions, but family is making choices.  Antonio states that they elected DNR today.  I shared that this is a loving choice, that if [name] heart were to stop with all this treatment, I do not believe that chest compressions would be able to restart it and he would die suffering.  Antonio tells me that she is realistic about his outcomes.   We talk about compassionate extubation, what this would look like and feel like.  We also briefly discussed trach at day 14.    Conference with attending,  bedside nursing staff, transition of care team related to patient condition, needs, goals of care.  Plan:   Now DNR, time for outcomes.  We discussed meaningful improvements.  52 minutes  Quinn Axe, NP Palliative Medicine Team  Greater than 50% of this time was spent counseling and coordinating care related to the above assessment and plan.

## 2020-01-15 NOTE — Progress Notes (Signed)
Progress Note  Patient Name: [name] Date of Encounter: 01/15/2020  Primary Cardiologist: [place] critically ill, intubated and sedated. On milrinone and Levophed along with CRRT. Planning to wean sedation upon family arrival. Renal function improving with CRRT. HGB 8.3-->7.6-->7.4. Right lower extremity swelling persists, though is improving. BP stable in the low 923R systolic. He is now a DNR/DNI after PCCM discussion with family.   Inpatient Medications    Scheduled Meds: . aspirin  81 mg Per Tube Daily  . chlorhexidine gluconate (MEDLINE KIT)  15 mL Mouth Rinse BID  . Chlorhexidine Gluconate Cloth  6 each Topical Daily  . docusate  100 mg Per Tube BID  . fluticasone  2 spray Each Nare Daily  . gabapentin  100 mg Oral QHS  . heparin  5,000 Units Subcutaneous Q8H  . insulin aspart  0-15 Units Subcutaneous TID WC  . insulin glargine  10 Units Subcutaneous QHS  . lidocaine  1 patch Transdermal Q24H  . loratadine  10 mg Per Tube Daily  . mouth rinse  15 mL Mouth Rinse 10 times per day  . metoCLOPramide (REGLAN) injection  10 mg Intravenous QID  . multivitamin with minerals  1 tablet Per Tube Daily  . pantoprazole (PROTONIX) IV  40 mg Intravenous BID  . polyethylene glycol  17 g Per Tube Daily  . scopolamine  1 patch Transdermal Q72H  . simvastatin  10 mg Per Tube QHS  . sodium chloride flush  3 mL Intravenous Once  . vitamin B-12  1,000 mcg Per Tube Daily   Continuous Infusions: .  prismasol BGK 4/2.5 500 mL/hr at 01/15/20 0619  .  prismasol BGK 4/2.5 500 mL/hr at 01/15/20 0619  . sodium chloride Stopped (01/14/20 2305)  . albumin human Stopped (01/14/20 2303)  . dexmedetomidine (PRECEDEX) IV infusion 0.8 mcg/kg/hr (01/15/20 0850)  . fentaNYL infusion INTRAVENOUS 100 mcg/hr (01/15/20 0809)  . milrinone 0.3 mcg/kg/min (01/15/20 0700)  . norepinephrine (LEVOPHED) Adult infusion 4 mcg/min (01/15/20 0700)  . prismasol BGK 4/2.5 2,000  mL/hr at 01/15/20 0607   PRN Meds: sodium chloride, acetaminophen **OR** acetaminophen, alum & mag hydroxide-simeth, calcium carbonate, guaiFENesin-dextromethorphan, heparin, ondansetron **OR** ondansetron (ZOFRAN) IV, oxyCODONE, polyvinyl alcohol, promethazine, sodium phosphate   Vital Signs    Vitals:   01/15/20 1015 01/15/20 1030 01/15/20 1045 01/15/20 1100  BP: (!) 105/54 (!) 103/54 (!) 105/54 (!) 107/51  Pulse: 71 70 71 68  Resp: _0 Temp:      TempSrc:      SpO2: 98% 99% 98% 98%  Weight:      Height:        Intake/Output Summary (Last 24 hours) at 01/15/2020 1118 Last data filed at 01/15/2020 1100 Gross per 24 hour  Intake 1498.9 ml  Output 4442 ml  Net -2943.1 ml   Filed Weights   01/11/20 1800 01/14/20 0322 01/15/20 0459  Weight: (!) 95.9 kg (!) 92.8 kg 91.6 kg    Telemetry    V-paced, 70s bpm - Personally Reviewed  ECG    No new tracings - Personally Reviewed  Physical Exam   GEN: Critically ill appearing.   Neck: JVD difficult to assess with respiratory support apparatus. Cardiac: RRR, I/VI systolic murmur, no rubs, or gallops.  Respiratory: Diminished breath sounds bilaterally. Intubated.  GI: Soft, nontender, non-distended.   MS: Left BKA; Improving RLE edema. No deformity. Neuro:  Intubated and sedated.  Psych: Intubated and sedated.  Labs    Chemistry Recent Labs  Lab 01/11/20 [phone] 01/12/20 0529 01/13/20 0723 01/13/20 0723 01/14/20 0509 01/14/20 1611 01/15/20 0409  NA 127*   < > 126*   < > 133* 133* 135  K 5.9*   < > 5.2*   < > 4.5 4.2 4.2  CL 92*   < > 93*   < > 100 100 101  CO2 20*   < > 18*   < > _0 GLUCOSE 296*   < > 150*   < > 129* 119* 112*  BUN 98*   < > 112*   < > 73* 49* 32*  CREATININE 3.55*   < > 4.19*   < > 2.87* 2.11* 1.65*  CALCIUM 8.0*   < > 8.1*   < > 7.3* 7.8* 7.9*  PROT 6.2*  --  5.9*  --   --   --   --   ALBUMIN 2.5*  --  3.3*   < > 3.5 3.7 3.8  AST 158*  --  50*  --   --   --   --   ALT 99*  --   60*  --   --   --   --   ALKPHOS 239*  --  162*  --   --   --   --   BILITOT 1.3*  --  1.5*  --   --   --   --   GFRNONAA 15*   < > 12*   < > 20* 28* 38*  GFRAA 18*   < > 14*   < > 23* 33* 44*  ANIONGAP 15   < > 15   < > _1 < > = values in this interval not displayed.     Hematology Recent Labs  Lab 01/14/20 0526 01/14/20 0526 01/14/20 1614 01/14/20 2254 01/15/20 0409  WBC 7.2  --   --  11.2* 11.2*  RBC 2.83*  --   --  2.73* 2.74*  HGB 7.6*   < > 7.6* 7.4* 7.4*  HCT 24.4*   < > 23.2* 24.0* 24.1*  MCV 86.2  --   --  87.9 88.0  MCH 26.9  --   --  27.1 27.0  MCHC 31.1  --   --  30.8 30.7  RDW 21.6*  --   --  22.3* 22.0*  PLT 310  --   --  225 194   < > = values in this interval not displayed.    Cardiac EnzymesNo results for input(s): TROPONINI in the last 168 hours. No results for input(s): TROPIPOC in the last 168 hours.   BNPNo results for input(s): BNP, PROBNP in the last 168 hours.   DDimer No results for input(s): DDIMER in the last 168 hours.   Radiology    DG Abd 1 View  Result Date: 01/13/2020 IMPRESSION: The stomach is gas distended. The small bowel and colon are gas-filled, however nondistended, with gas present to the rectum. No significant burden of stool. Radiographic findings are not significant changed compared to prior examination dated 01/09/2020. Electronically Signed   By: Eddie Candle M.D.   On: 01/13/2020 10:07   DG Chest Port 1 View  Result Date: 01/13/2020 IMPRESSION: Line and tube positions as above. Small RIGHT pleural effusion, bibasilar atelectasis and perihilar infiltrates question edema versus infection. Electronically Signed   By: Lavonia Dana M.D.   On: 01/13/2020 18:10   DG  Abd Portable 1V  Result Date: 01/13/2020 IMPRESSION: 1. Tip of enteric tube in the proximal body of the stomach. 2. Persistent gaseous distension of the stomach and small bowel, concerning for bowel obstruction. Electronically Signed   By: Vinnie Langton M.D.    On: 01/13/2020 18:09    Cardiac Studies   2D echo 01/10/2020: 1. Left ventricular ejection fraction, by estimation, is 30 to 35%. The  left ventricle has moderately decreased function. The left ventricle  demonstrates global hypokinesis. The left ventricular internal cavity size  was mildly dilated. Left ventricular  diastolic parameters were normal.  2. Right ventricular systolic function was not well visualized. The right  ventricular size is mildly enlarged. There is moderately elevated  pulmonary artery systolic pressure.  3. Left atrial size was mildly dilated.  4. The mitral valve is grossly normal. Mild mitral valve regurgitation.  5. Tricuspid valve regurgitation is mild to moderate.  6. The aortic valve is grossly normal. Aortic valve regurgitation is  trivial.   Patient Profile     79 y.o. male with history of CAD s/p CABG x 7 in 2005, reported symptomatic bradycardia s/p PPMearlier this year at the [place], CKD stage III, PAD, IDDM, anemia of chronic disease, HTN, HLD, BPH/urinary retentionwho is being seen today for the evaluation of cardiomyopathy.  Assessment & Plan    1. HFrEF/cardiorenal syndrome.cardiogenic shock: -Chronicity uncertain -Presumably ischemic in etiology given the patient's prior history of cardiac bypass surgery -Echo this admission shows an EF of 20 to 25% by [facility] with severe TR and elevated right heart pressures -He remains volume overloaded with improvement with CRRT -Transferred to the ICU on 7/23 and placed on milrinone gtt to assist with augmentation of diuresis  -SCr improving with CRRT  -On milrinone and Levophed, wean as able  -Palliative care on board  -Family has now made the patient a DNR/DNI -Relative hypotension and decompensation preclude escalation of GDMT at this time -Pending clinical course, would need R/LHC prior to discharge  -Unable to transfer at this time due to acute illness -Consider compression stocking along the  right lower extremity, pending clinical course -He remains critically ill with evidence of multiorgan failure  2. 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 Anemia of chronic disease: -Low, though stable -Transfuse as indicated  -Management per primary    For questions or updates, please contact [place] Please consult [email or web address] for contact info under Cardiology/STEMI.    Signed, Christell Faith, PA-C [place] Pager: [phone] 01/15/2020, 11:18 AM

## 2020-01-15 NOTE — Progress Notes (Addendum)
Nutrition Follow-up  DOCUMENTATION CODES:   Non-severe (moderate) malnutrition in context of chronic illness  INTERVENTION:  Initiate Vital AF 1.2 Cal at 10 mL/hr.  If patient tolerates recommend advancing by 10 mL/hr every 8 hours to goal rate of Vital AF 1.2 Cal at 50 mL/hr (1200 mL goal daily volume). Also provide Pro-Stat 60 mL BID per tube. Goal regimen provides 1840 kcal, 150 grams of protein, 972 mL H2O daily.  Provide B-complex with C daily per tube.  Monitor magnesium, potassium, and phosphorus daily for at least 3 days, MD to replete as needed, as pt is at risk for refeeding syndrome.  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  Ongoing.  GOAL:   Patient will meet greater than or equal to 90% of their needs  Progressing with initiation of tube feeds.  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 Patient presented from podiatry office with complaints of significantly increased pain in left foot for 4-5 days.   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 -On 7/25 patient had worsening abdominal distention and episodes of bilious emesis and AMS. He was intubated and started on CRRT. OGT was placed for decompression.  Patient is currently intubated on ventilator support MV: 8.2 L/min Temp (24hrs), Avg:98.2 F (36.8 C), Min:97.7 F (36.5 C), Max:98.8 F (37.1  C)  Medications reviewed and include: Colace 100 mg BID, gabapentin, Novolog 0-15 units TID, Lantus 10 units QHS, Reglan 10 mg QID IV, MVI daily, Protonix, Miralax, vitamin B12 1000 micrograms daily, human albumin 25 grams TID IV, Precedex gtt, fentanyl gtt, milrinone gtt, norepinephrine gtt at 4 mcg/min.  Labs reviewed: CBG 88-112, BUN 32, Creatinine 1.65, Magnesium 2.6.  I/O: 125 mL UOP yesterday (0.1 mL/kg/hr)  Weight trend: 91.6 kg on 7/27; -4.3 kg from 7/23  Enteral Access: OGT placed 7/26; terminates in proximal stomach per abdominal x-ray 7/26  Discussed with RN and on rounds. Per abdominal x-ray from yesterday bowel gas pattern is normal. Plan is to start tube feeds at 10 mL/hr today.  Diet Order:   Diet Order    None     EDUCATION NEEDS:   No education needs have been identified at this time  Skin:  Skin Assessment: Skin Integrity Issues: Skin Integrity Issues:: Stage III, Incisions, Other (Comment) Stage III: buttocks (3cm x 3cm) Incisions: closed incision left leg Other: ecchymosis  Last BM:  01/11/2020  Height:   Ht Readings from Last 1 Encounters:  01/11/20 6\' 2"  (1.88 m)   Weight:   Wt Readings from Last 1 Encounters:  01/15/20 91.6 kg   Ideal Body Weight:  86.4 kg  BMI:  Body mass index is 25.93 kg/m.  Estimated Nutritional Needs:   Kcal:  1867 (PSU 2003b w/ MSJ 1697, Ve 8.2, Tmax 37.1)  Protein:  140-165 grams on CRRT  Fluid:  1.9 L  Jacklynn Barnacle, MS, RD, LDN Pager number available on Amion

## 2020-01-15 NOTE — Progress Notes (Signed)
[facility]  ROUNDING NOTE   Subjective:   Patient remains critically ill, vent dependent, pressors and sedated Precedex drip Vent: fio2 21 % requiring pressors: levophed Milrinone infusion CRRT started 7/25; tolerating fair UF 3900 cc Pre pump 500 cc/hr BFR 300 cc/hr Dialysate 2000 cc/hr Post 500 cc/hr Effluent dose 34.7 ml/kg/hr   Objective:  Vital signs in last 24 hours:  Temp:  [97.7 F (36.5 C)-98.8 F (37.1 C)] 97.7 F (36.5 C) (07/27 0800) Pulse Rate:  [61-73] 70 (07/27 0900) Resp:  [14-25] 16 (07/27 0900) BP: (95-117)/(47-59) 105/55 (07/27 0900) SpO2:  [90 %-100 %] 99 % (07/27 0900) FiO2 (%):  [21 %] 21 % (07/27 0800) Weight:  [91.6 kg] 91.6 kg (07/27 0459)  Weight change: -1.2 kg Filed Weights   01/11/20 1800 01/14/20 0322 01/15/20 0459  Weight: (!) 95.9 kg (!) 92.8 kg 91.6 kg    Intake/Output: I/O last 3 completed shifts: In: 2249.7 [I.V.:1909.2; Other:13; NG/GT:35; IV Piggyback:292.4] Out: 5683 [Urine:225; Emesis/NG output:400; FFMBW:4665]   Intake/Output this shift:  Total I/O In: 93.1 [I.V.:93.1] Out: 0    Physical Exam: General:  Critically ill, laying in the bed,   HEENT  ETT, OGT in place  Pulm/lungs  Vent assisted  CVS/Heart  irregular rhythm, no rub or gallop, paced  Abdomen:   Soft,   Extremities:  ++ dependent peripheral edema, left BKA  Neurologic:  sedated  Foley in place Rt IJ remp cath    Basic Metabolic Panel: Recent Labs  Lab 01/09/20 0446 01/10/20 0526 01/10/20 2146 01/11/20 0443 01/12/20 0529 01/12/20 0529 01/13/20 0723 01/13/20 0723 01/14/20 0509 01/14/20 1611 01/15/20 0409  NA 127*   < > 125*   < > 128*  --  126*  --  133* 133* 135  K 4.9   < > 5.4*   < > 5.0  --  5.2*  --  4.5 4.2 4.2  CL 95*   < > 94*   < > 94*  --  93*  --  100 100 101  CO2 19*   < > 20*   < > 18*  --  18*  --  _0 GLUCOSE 195*   < > 239*   < > 159*  --  150*  --  129* 119* 112*  BUN 93*   < > 98*   < > 99*  --  112*   --  73* 49* 32*  CREATININE 2.81*   < > 3.43*   < > 3.83*  --  4.19*  --  2.87* 2.11* 1.65*  CALCIUM 7.7*   < > 7.8*   < > 8.2*   < > 8.1*   < > 7.3* 7.8* 7.9*  MG 2.1  --  2.2  --   --   --  2.4  --  2.3  --  2.6*  PHOS  --   --   --   --   --   --  6.8*  --  4.9* 3.3 2.9   < > = values in this interval not displayed.    Liver Function Tests: Recent Labs  Lab 01/11/20 0443 01/13/20 0723 01/14/20 0509 01/14/20 1611 01/15/20 0409  AST 158* 50*  --   --   --   ALT 99* 60*  --   --   --   ALKPHOS 239* 162*  --   --   --   BILITOT 1.3* 1.5*  --   --   --  PROT 6.2* 5.9*  --   --   --   ALBUMIN 2.5* 3.3* 3.5 3.7 3.8   No results for input(s): LIPASE, AMYLASE in the last 168 hours. No results for input(s): AMMONIA in the last 168 hours.  CBC: Recent Labs  Lab 01/09/20 0446 01/11/20 0443 01/12/20 0529 01/12/20 0529 01/13/20 0723 01/14/20 0526 01/14/20 1614 01/14/20 2254 01/15/20 0409  WBC 12.8*   < > 13.1*  --  10.6* 7.2  --  11.2* 11.2*  NEUTROABS 9.9*  --   --   --  9.2*  --   --   --   --   HGB 8.5*   < > 8.1*   < > 8.3* 7.6* 7.6* 7.4* 7.4*  HCT 25.7*   < > 24.8*   < > 24.9* 24.4* 23.2* 24.0* 24.1*  MCV 81.3   < > 83.5  --  83.0 86.2  --  87.9 88.0  PLT 570*   < > 440*  --  413* 310  --  225 194   < > = values in this interval not displayed.    Cardiac Enzymes: No results for input(s): CKTOTAL, CKMB, CKMBINDEX, TROPONINI in the last 168 hours.  BNP: Invalid input(s): POCBNP  CBG: Recent Labs  Lab 01/14/20 1637 01/14/20 2025 01/15/20 0021 01/15/20 0403 01/15/20 0742  GLUCAP 115* 114* 112* 104* 97    Microbiology: Results for orders placed or performed during the hospital encounter of 01/12/2020  Blood Cultures x 2 sites     Status: None   Collection Time: 12/25/2019  8:48 PM   Specimen: BLOOD  Result Value Ref Range Status   Specimen Description BLOOD BLOOD LEFT FOREARM  Final   Special Requests   Final    BOTTLES DRAWN AEROBIC AND ANAEROBIC Blood  Culture adequate volume   Culture   Final    NO GROWTH 5 DAYS Performed at [facility], [address] [place], [place] [address]    Report Status 12/23/2019 FINAL  Final  Culture, blood (Routine X 2) w Reflex to ID Panel     Status: None   Collection Time: 12/27/19  7:01 PM   Specimen: BLOOD  Result Value Ref Range Status   Specimen Description BLOOD BLOOD RIGHT HAND  Final   Special Requests   Final    BOTTLES DRAWN AEROBIC AND ANAEROBIC Blood Culture adequate volume   Culture   Final    NO GROWTH 5 DAYS Performed at [facility], [address] [place], [place] [address]    Report Status 01/02/2020 FINAL  Final  SARS Coronavirus 2 by RT PCR (hospital order, performed in [facility] hospital lab) Nasopharyngeal Nasopharyngeal Swab     Status: None   Collection Time: 12/27/19  8:45 PM   Specimen: Nasopharyngeal Swab  Result Value Ref Range Status   SARS Coronavirus 2 NEGATIVE NEGATIVE Final    Comment: (NOTE) SARS-CoV-2 target nucleic acids are NOT DETECTED.  The SARS-CoV-2 RNA is generally detectable in upper and lower respiratory specimens during the acute phase of infection. The lowest concentration of SARS-CoV-2 viral copies this assay can detect is 250 copies / mL. A negative result does not preclude SARS-CoV-2 infection and should not be used as the sole basis for treatment or other patient management decisions.  A negative result may occur with improper specimen collection / handling, submission of specimen other than nasopharyngeal swab, presence of viral mutation(s) within the areas targeted by this assay, and inadequate number of  viral copies (<250 copies / mL). A negative result must be combined with clinical observations, patient history, and epidemiological information.  Fact Sheet for Patients:   StrictlyIdeas.no  Fact Sheet for Healthcare Providers: BankingDealers.co.za  This test is not  yet approved or  cleared by the [place] FDA and has been authorized for detection and/or diagnosis of SARS-CoV-2 by FDA under an Emergency Use Authorization (EUA).  This EUA will remain in effect (meaning this test can be used) for the duration of the COVID-19 declaration under Section 564(b)(1) of the Act, 21 U.S.C. section 360bbb-3(b)(1), unless the authorization is terminated or revoked sooner.  Performed at [facility], [address] [place], [place] [address]   MRSA PCR Screening     Status: None   Collection Time: 12/27/19  8:45 PM   Specimen: Nasopharyngeal  Result Value Ref Range Status   MRSA by PCR NEGATIVE NEGATIVE Final    Comment:        The GeneXpert MRSA Assay (FDA approved for NASAL specimens only), is one component of a comprehensive MRSA colonization surveillance program. It is not intended to diagnose MRSA infection nor to guide or monitor treatment for MRSA infections. Performed at [facility], [address], [place], [place] [address]   Urine Culture     Status: Abnormal   Collection Time: 01/08/20 10:45 AM   Specimen: Urine, Random  Result Value Ref Range Status   Specimen Description   Final    URINE, RANDOM Performed at [facility], [address] [place], [place] [address]    Special Requests   Final    NONE Performed at [facility], [address] [place], [place] [address]    Culture >=100,000 COLONIES/mL YEAST (A)  Final   Report Status 01/09/2020 FINAL  Final    Coagulation Studies: No results for input(s): LABPROT, INR in the last 72 hours.  Urinalysis: No results for input(s): COLORURINE, LABSPEC, PHURINE, GLUCOSEU, HGBUR, BILIRUBINUR, KETONESUR, PROTEINUR, UROBILINOGEN, NITRITE, LEUKOCYTESUR in the last 72 hours.  Invalid input(s): APPERANCEUR    Imaging: DG Abd 1 View  Result Date: 01/14/2020 CLINICAL DATA:  Orogastric tube placement. EXAM: ABDOMEN - 1 VIEW COMPARISON:  January 13, 2020. FINDINGS: The bowel gas pattern is normal. Distal tip of enteric tube is seen in the proximal stomach. No radio-opaque calculi or other significant radiographic abnormality are seen. IMPRESSION: Distal tip of enteric tube seen in proximal stomach. Electronically Signed   By: Marijo Conception M.D.   On: 01/14/2020 12:51   DG Abd 1 View  Result Date: 01/13/2020 CLINICAL DATA:  Small-bowel obstruction EXAM: ABDOMEN - 1 VIEW COMPARISON:  01/09/2020 FINDINGS: The stomach is gas distended. The small bowel and colon are gas-filled, however nondistended, with gas present to the rectum. No significant burden of stool. No free air in the abdomen on supine radiographs. Vascular calcinosis. IMPRESSION: The stomach is gas distended. The small bowel and colon are gas-filled, however nondistended, with gas present to the rectum. No significant burden of stool. Radiographic findings are not significant changed compared to prior examination dated 01/09/2020. Electronically Signed   By: Eddie Candle M.D.   On: 01/13/2020 10:07   DG Chest Port 1 View  Result Date: 01/14/2020 CLINICAL DATA:  Intubation and OG tube placement. EXAM: PORTABLE CHEST 1 VIEW COMPARISON:  None. FINDINGS: The heart is enlarged, exaggerated by low lung volumes. Mild pulmonary vascular congestion is present. Endotracheal tube terminates 4.5 cm above the carina. Side port  of the OG tube is in the stomach. IMPRESSION: 1. Cardiomegaly and mild pulmonary vascular congestion. 2. Endotracheal tube terminates 4.5 cm above the carina. 3. Side port of the OG tube is in the stomach. Electronically Signed   By: San Morelle M.D.   On: 01/14/2020 14:11   DG Chest Port 1 View  Result Date: 01/13/2020 CLINICAL DATA:  Intubation, orogastric tube placement EXAM: PORTABLE CHEST 1 VIEW COMPARISON:  Portable exam 1600 hours compared to 08/11/2015 FINDINGS: Rotated to the RIGHT. Tip of endotracheal tube projects 4.6 cm above carina. Nasogastric tube  extends into stomach. RIGHT jugular central venous catheter with tip projecting over cavoatrial junction. LEFT subclavian transvenous pacemaker with leads projecting over RIGHT atrium, RIGHT ventricle, and coronary sinus. Upper normal size of cardiac silhouette. Mediastinal contours and pulmonary vascularity normal for degree of rotation. Bibasilar atelectasis and small RIGHT pleural effusion. Mild perihilar infiltrates which could represent edema or infection. No pneumothorax. Gaseous distention of stomach. IMPRESSION: Line and tube positions as above. Small RIGHT pleural effusion, bibasilar atelectasis and perihilar infiltrates question edema versus infection. Electronically Signed   By: Lavonia Dana M.D.   On: 01/13/2020 18:10   DG Abd Portable 1V  Result Date: 01/13/2020 CLINICAL DATA:  79 year old male status post orogastric tube placement. EXAM: PORTABLE ABDOMEN - 1 VIEW COMPARISON:  01/13/2020. FINDINGS: Interval placement of enteric tube with tip projecting over the proximal body of the stomach. There is persistent gaseous distension of the stomach. Several dilated loops of gas-filled small bowel are again noted measuring up to 4 cm in the left side of the abdomen. Pacemaker leads projecting over the inferior aspect of the heart. IMPRESSION: 1. Tip of enteric tube in the proximal body of the stomach. 2. Persistent gaseous distension of the stomach and small bowel, concerning for bowel obstruction. Electronically Signed   By: Vinnie Langton M.D.   On: 01/13/2020 18:09     Medications:   .  prismasol BGK 4/2.5 500 mL/hr at 01/15/20 7989  .  prismasol BGK 4/2.5 500 mL/hr at 01/15/20 0619  . sodium chloride Stopped (01/14/20 2305)  . albumin human Stopped (01/14/20 2303)  . dexmedetomidine (PRECEDEX) IV infusion 0.8 mcg/kg/hr (01/15/20 0850)  . fentaNYL infusion INTRAVENOUS 100 mcg/hr (01/15/20 0809)  . milrinone 0.3 mcg/kg/min (01/15/20 0700)  . norepinephrine (LEVOPHED) Adult infusion 4  mcg/min (01/15/20 0700)  . prismasol BGK 4/2.5 2,000 mL/hr at 01/15/20 0607   . aspirin  81 mg Per Tube Daily  . chlorhexidine gluconate (MEDLINE KIT)  15 mL Mouth Rinse BID  . Chlorhexidine Gluconate Cloth  6 each Topical Daily  . docusate  100 mg Per Tube BID  . fluticasone  2 spray Each Nare Daily  . gabapentin  100 mg Oral QHS  . heparin  5,000 Units Subcutaneous Q8H  . insulin aspart  0-15 Units Subcutaneous TID WC  . insulin glargine  10 Units Subcutaneous QHS  . lidocaine  1 patch Transdermal Q24H  . loratadine  10 mg Per Tube Daily  . mouth rinse  15 mL Mouth Rinse 10 times per day  . metoCLOPramide (REGLAN) injection  10 mg Intravenous QID  . multivitamin with minerals  1 tablet Per Tube Daily  . pantoprazole (PROTONIX) IV  40 mg Intravenous BID  . polyethylene glycol  17 g Per Tube Daily  . scopolamine  1 patch Transdermal Q72H  . simvastatin  10 mg Per Tube QHS  . sodium chloride flush  3 mL Intravenous Once  .  vitamin B-12  1,000 mcg Per Tube Daily   sodium chloride, acetaminophen **OR** acetaminophen, alum & mag hydroxide-simeth, calcium carbonate, guaiFENesin-dextromethorphan, heparin, ondansetron **OR** ondansetron (ZOFRAN) IV, oxyCODONE, polyvinyl alcohol, promethazine, sodium phosphate  Assessment/ Plan:   [name] is a 79 y.o. 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 Lucky Cowboy  1. Acute renal failure with hyperkalemia and  metabolic acidosis on chronic kidney diease stage IV with baseline creatinine of 2.24, GFR of 26 on 02/11/2018. Chronic kidney disease secondary to diabetic nephropathy, hypertension and obstructive uropathy.  No IV contrast. Renal ultrasound with no obstruction.   Acute renal failure seems to be related to ATN from hemodynamic instability - CRRT initiated on 7/25 - d/c furosemide drip - UF -200 cc/hr - continue albumin for oncotic support.  2. Acute resp failure - vent dependent  3. Chronic systolic CHF and Generalized edema EF 25-30%  Adjust CRRT volume removal as tolerated   LOS: 20 [name] 7/27/20219:39 AM

## 2020-01-15 NOTE — Progress Notes (Addendum)
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 Patient was started on IV vancomycin, Rocephin and Flagyl, podiatry and vascular surgeon were consulted. Patient had a angiogram and revascularization on 7/12. But the foot gangrene does not seem to be improving, as a result, planning for below-knee amputation. 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 Cardiology saw patient and recommendation for milrinone drip with diuresis. PCCM consult for further evaluation and management.   01/13/20- patient has worseningabdominal distension andhe had few more episodes of bilious vomitus, he is altered with encephalopathy and is clinically declining. Goals of Care: Family brought in written advanced directive. Patient wishes to be full code and an assigned POA is named (daughter Ginger) in case he is unable to make decisions. Ginger is present during GOC discussion. We reviewed GI recommendation for NGT decompression and nephrology recommendation for dialysis and cardiology favor CVP trending while on milrinone gtt. They whish to proceed with these procedures. Additionally we discussed continued vomiting in context of lethargy with shock and they do wish for patient to be intubated for airway protection. They do understand that patient is overall with poor prognosis and wish to attept at helping him through this but will not wish for prolonged aggressive care if despite full scope of therapy patient does not improve.  7/26 remains intubated,sedated on CRRT on pressors +multiorgan failure, family updated  several times 7/27 on pressors, on vent, on CRRT    CC  follow up respiratory failure  SUBJECTIVE Patient remains critically ill Prognosis is guarded   BP (!) 111/56   Pulse 70   Temp 97.7 F (36.5 C) (Rectal)   Resp 16   Ht 6' 2" (1.88 m)   Wt 91.6 kg   SpO2 97%   BMI 25.93 kg/m    I/O last 3 completed shifts: In: 2249.7 [I.V.:1909.2; Other:13; NG/GT:35; IV Piggyback:292.4] Out: 5683 [Urine:225; Emesis/NG output:400; Other:5058] No intake/output data recorded.  SpO2: 97 % O2 Flow Rate (L/min): 2 L/min FiO2 (%): 21 %  Estimated body mass index is 25.93 kg/m as calculated from the following:   Height as of this encounter: 6' 2" (1.88 m).   Weight as of this encounter: 91.6 kg.  SIGNIFICANT EVENTS   REVIEW OF SYSTEMS  PATIENT IS UNABLE TO PROVIDE COMPLETE REVIEW OF SYSTEMS DUE TO SEVERE CRITICAL ILLNESS   Pressure Injury 12/29/19 Buttocks Medial Stage 3 -  Full thickness tissue loss. Subcutaneous fat may be visible but bone, tendon or muscle are NOT exposed. Evolved to patchy areas of partial thickness wounds related to moisture associated skin damage whe (Active)  12/29/19 0442  Location: Buttocks  Location Orientation: Medial  Staging: Stage 3 -  Full thickness tissue loss. Subcutaneous fat may be visible but bone, tendon or muscle are NOT exposed.  Wound Description (Comments): Evolved to patchy areas of partial thickness wounds related to moisture associated skin damage when assessed on 7/21  Present on Admission: Yes (Pt reported area was present before admission)      PHYSICAL EXAMINATION:  GENERAL:critically ill appearing, +resp distress HEAD: Normocephalic, atraumatic.  EYES: Pupils equal, round, reactive to light.  No scleral icterus.  MOUTH: Moist mucosal membrane. NECK: Supple.  PULMONARY: +rhonchi, +wheezing CARDIOVASCULAR: S1 and S2.   Regular rate and rhythm. No murmurs, rubs, or gallops.  GASTROINTESTINAL: Soft, nontender, -distended.   Positive bowel sounds.   MUSCULOSKELETAL: LEFT BKA NEUROLOGIC: obtunded, GCS<8 SKIN:intact,warm,dry  MEDICATIONS: I have reviewed all medications and confirmed regimen as documented   CULTURE RESULTS   Recent Results (from the past 240 hour(s))  Urine Culture     Status: Abnormal   Collection Time: 01/08/20 10:45 AM   Specimen: Urine, Random  Result Value Ref Range Status   Specimen Description   Final    URINE, RANDOM Performed at [facility], [address] [place], [place] [address]    Special Requests   Final    NONE Performed at [facility], [address] [place], [place] [address]    Culture >=100,000 COLONIES/mL YEAST (A)  Final   Report Status 01/09/2020 FINAL  Final          IMAGING    DG Abd 1 View  Result Date: 01/14/2020 CLINICAL DATA:  Orogastric tube placement. EXAM: ABDOMEN - 1 VIEW COMPARISON:  January 13, 2020. FINDINGS: The bowel gas pattern is normal. Distal tip of enteric tube is seen in the proximal stomach. No radio-opaque calculi or other significant radiographic abnormality are seen. IMPRESSION: Distal tip of enteric tube seen in proximal stomach. Electronically Signed   By: Taje  Green Jr M.D.   On: 01/14/2020 12:51   DG Chest Port 1 View  Result Date: 01/14/2020 CLINICAL DATA:  Intubation and OG tube placement. EXAM: PORTABLE CHEST 1 VIEW COMPARISON:  None. FINDINGS: The heart is enlarged, exaggerated by low lung volumes. Mild pulmonary vascular congestion is present. Endotracheal tube terminates 4.5 cm above the carina. Side port of the OG tube is in the stomach. IMPRESSION: 1. Cardiomegaly and mild pulmonary vascular congestion. 2. Endotracheal tube terminates 4.5 cm above the carina. 3. Side port of the OG tube is in the stomach. Electronically Signed   By: Christopher  Mattern M.D.   On: 01/14/2020 14:11     Nutrition Status: Nutrition Problem: Moderate Malnutrition Etiology: chronic illness (CKD, hx chronic left foot  ulcer now s/p left BKA) Signs/Symptoms: moderate fat depletion, moderate muscle depletion Interventions: Refer to RD note for recommendations     Indwelling Urinary Catheter continued, requirement due to   Reason to continue Indwelling Urinary Catheter strict Intake/Output monitoring for hemodynamic instability   Central Line/ continued, requirement due to  Reason to continue Centra Line Monitoring of central venous pressure or other hemodynamic parameters and poor IV access   Ventilator continued, requirement due to severe respiratory failure   Ventilator Sedation RASS 0 to -2      ASSESSMENT AND PLAN SYNOPSIS  79 yo male with progressive hypoxic resp failure from acute and severe decompensated systolic heart failure likely from demand ischemia from extensive vasc surgery complicated  by abd distention and renal failure with acidosis and signs and symptoms of ileus with severe metabolic encephalopathy  Severe ACUTE Hypoxic and Hypercapnic Respiratory Failure -continue Full MV support -continue Bronchodilator Therapy -Wean Fio2 and PEEP as tolerated -will perform SAT/SBT when respiratory parameters are met -VAP/VENT bundle implementation  ACUTE SYSTOLIC CARDIAC FAILURE- EF 30% On milrinone, pressors  ACUTE KIDNEY INJURY/Renal Failure -continue Foley Catheter-assess need -Avoid nephrotoxic agents -Follow urine output, BMP -Ensure adequate renal perfusion, optimize oxygenation -Renal dose medications On CRRT    NEUROLOGY - intubated and sedated - minimal sedation to achieve a RASS goal: -1 Wake up assessment pending   SHOCK-SEPSIS/CARDIOGENIC -use vasopressors to keep MAP>65 -follow   ABG and LA -follow up cultures -emperic ABX   CARDIAC ICU monitoring  ID -continue IV abx as prescibed -follow up cultures  GI GI PROPHYLAXIS as indicated   DIET-->TF's as tolerated Constipation protocol as indicated  ENDO - will use ICU hypoglycemic\Hyperglycemia  protocol if indicated     ELECTROLYTES -follow labs as needed -replace as needed -pharmacy consultation and following   DVT/GI PRX ordered and assessed TRANSFUSIONS AS NEEDED MONITOR FSBS I Assessed the need for Labs I Assessed the need for Foley I Assessed the need for Central Venous Line Family Discussion when available I Assessed the need for Mobilization I made an Assessment of medications to be adjusted accordingly Safety Risk assessment completed   CASE DISCUSSED IN MULTIDISCIPLINARY ROUNDS WITH ICU TEAM  Critical Care Time devoted to patient care services described in this note is 34 minutes.   Overall, patient is critically ill, prognosis is guarded.  Patient with Multiorgan failure and at high risk for cardiac arrest and death.   Recommend DNR status Palliative care consulted  Corrin Parker, M.D.  [facility]  Medical Director [place] Director [facility] Cardio-Pulmonary Department

## 2020-01-15 NOTE — Progress Notes (Signed)
Gave pt's POA Ginger an update on how the pt was doing. Ginger states that she and the family have decided to make him a DNR. Message relayed to MD and orders placed.

## 2020-01-16 ENCOUNTER — Inpatient Hospital Stay: Payer: No Typology Code available for payment source

## 2020-01-16 DIAGNOSIS — Z515 Encounter for palliative care: Secondary | ICD-10-CM | POA: Diagnosis not present

## 2020-01-16 DIAGNOSIS — J9690 Respiratory failure, unspecified, unspecified whether with hypoxia or hypercapnia: Secondary | ICD-10-CM | POA: Diagnosis not present

## 2020-01-16 DIAGNOSIS — N183 Chronic kidney disease, stage 3 unspecified: Secondary | ICD-10-CM | POA: Diagnosis not present

## 2020-01-16 DIAGNOSIS — L97523 Non-pressure chronic ulcer of other part of left foot with necrosis of muscle: Secondary | ICD-10-CM | POA: Diagnosis not present

## 2020-01-16 DIAGNOSIS — I5023 Acute on chronic systolic (congestive) heart failure: Secondary | ICD-10-CM | POA: Diagnosis not present

## 2020-01-16 DIAGNOSIS — R570 Cardiogenic shock: Secondary | ICD-10-CM

## 2020-01-16 DIAGNOSIS — Z7189 Other specified counseling: Secondary | ICD-10-CM | POA: Diagnosis not present

## 2020-01-16 DIAGNOSIS — N179 Acute kidney failure, unspecified: Secondary | ICD-10-CM | POA: Diagnosis not present

## 2020-01-16 DIAGNOSIS — I5021 Acute systolic (congestive) heart failure: Secondary | ICD-10-CM | POA: Diagnosis not present

## 2020-01-16 LAB — RENAL FUNCTION PANEL
Albumin: 4.3 g/dL (ref 3.5–5.0)
Albumin: 4.7 g/dL (ref 3.5–5.0)
Anion gap: 12 (ref 5–15)
Anion gap: 12 (ref 5–15)
BUN: 14 mg/dL (ref 8–23)
BUN: 10 mg/dL (ref 8–23)
CO2: 24 mmol/L (ref 22–32)
CO2: 24 mmol/L (ref 22–32)
Calcium: 8.2 mg/dL — ABNORMAL LOW (ref 8.9–10.3)
Calcium: 8.3 mg/dL — ABNORMAL LOW (ref 8.9–10.3)
Chloride: 101 mmol/L (ref 98–111)
Chloride: 101 mmol/L (ref 98–111)
Creatinine, Ser: 1.1 mg/dL (ref 0.61–1.24)
Creatinine, Ser: 1.15 mg/dL (ref 0.61–1.24)
GFR calc Af Amer: >60 mL/min (ref >60)
GFR calc Af Amer: >60 mL/min (ref >60)
GFR calc non Af Amer: >60 mL/min (ref >60)
GFR calc non Af Amer: 59 mL/min — ABNORMAL LOW (ref >60)
Glucose, Bld: 102 mg/dL — ABNORMAL HIGH (ref 70–99)
Glucose, Bld: 110 mg/dL — ABNORMAL HIGH (ref 70–99)
Phosphorus: 1.9 mg/dL — ABNORMAL LOW (ref 2.5–4.6)
Phosphorus: 2.4 mg/dL — ABNORMAL LOW (ref 2.5–4.6)
Potassium: 4 mmol/L (ref 3.5–5.1)
Potassium: 4.1 mmol/L (ref 3.5–5.1)
Sodium: 137 mmol/L (ref 135–145)
Sodium: 137 mmol/L (ref 135–145)

## 2020-01-16 LAB — BLOOD GAS, ARTERIAL
Acid-base deficit: 6.7 mmol/L — ABNORMAL HIGH (ref 0.0–2.0)
Bicarbonate: 19.7 mmol/L — ABNORMAL LOW (ref 20.0–28.0)
FIO2: 0.4
MECHVT: 500 mL
Mechanical Rate: 16
O2 Saturation: 84 %
PEEP: 5 cmH2O
Patient temperature: 37
pCO2 arterial: 43 mmHg (ref 32.0–48.0)
pH, Arterial: 7.27 — ABNORMAL LOW (ref 7.350–7.450)
pO2, Arterial: 56 mmHg — ABNORMAL LOW (ref 83.0–108.0)

## 2020-01-16 LAB — CBC
HCT: 23.1 % — ABNORMAL LOW (ref 39.0–52.0)
Hemoglobin: 7.4 g/dL — ABNORMAL LOW (ref 13.0–17.0)
MCH: 27.7 pg (ref 26.0–34.0)
MCHC: 32 g/dL (ref 30.0–36.0)
MCV: 86.5 fL (ref 80.0–100.0)
Platelets: 171 10*3/uL (ref 150–400)
RBC: 2.67 MIL/uL — ABNORMAL LOW (ref 4.22–5.81)
RDW: 22 % — ABNORMAL HIGH (ref 11.5–15.5)
WBC: 13.9 10*3/uL — ABNORMAL HIGH (ref 4.0–10.5)
nRBC: 0 % (ref 0.0–0.2)

## 2020-01-16 LAB — TRIGLYCERIDES: Triglycerides: 84 mg/dL (ref <150)

## 2020-01-16 LAB — GLUCOSE, CAPILLARY
Glucose-Capillary: 100 mg/dL — ABNORMAL HIGH (ref 70–99)
Glucose-Capillary: 104 mg/dL — ABNORMAL HIGH (ref 70–99)
Glucose-Capillary: 122 mg/dL — ABNORMAL HIGH (ref 70–99)
Glucose-Capillary: 159 mg/dL — ABNORMAL HIGH (ref 70–99)
Glucose-Capillary: 95 mg/dL (ref 70–99)
Glucose-Capillary: 98 mg/dL (ref 70–99)

## 2020-01-16 LAB — MAGNESIUM: Magnesium: 2.6 mg/dL — ABNORMAL HIGH (ref 1.7–2.4)

## 2020-01-16 LAB — LACTIC ACID, PLASMA: Lactic Acid, Venous: 1.4 mmol/L (ref 0.5–1.9)

## 2020-01-16 MED ORDER — SODIUM BICARBONATE 8.4 % IV SOLN: 50.0000 meq | Freq: Once | INTRAVENOUS | Status: AC

## 2020-01-16 MED ORDER — PROPOFOL 1000 MG/100ML IV EMUL
5.0000 ug/kg/min | INTRAVENOUS | Status: DC
  Administered 2020-01-16 (×3): 30 ug/kg/min via INTRAVENOUS
  Administered 2020-01-17: 20 ug/kg/min via INTRAVENOUS
  Filled 2020-01-16 (×4): qty 100

## 2020-01-16 MED ORDER — SODIUM PHOSPHATES 45 MMOLE/15ML IV SOLN
30.0000 mmol | Freq: Once | INTRAVENOUS | Status: AC
  Administered 2020-01-16: 30 mmol via INTRAVENOUS
  Filled 2020-01-16: qty 10

## 2020-01-16 MED ORDER — NOREPINEPHRINE 16 MG/250ML-% IV SOLN
0.0000 ug/min | INTRAVENOUS | Status: DC
  Administered 2020-01-16: 20 ug/min via INTRAVENOUS
  Administered 2020-01-17 (×2): 35 ug/min via INTRAVENOUS
  Filled 2020-01-16 (×5): qty 250

## 2020-01-16 MED ORDER — VITAL HIGH PROTEIN PO LIQD
1000.0000 mL | ORAL | Status: DC
  Administered 2020-01-16: 1000 mL

## 2020-01-16 MED ORDER — VASOPRESSIN 20 UNITS/100 ML INFUSION FOR SHOCK
0.0000 [IU]/min | INTRAVENOUS | Status: DC
  Administered 2020-01-16 – 2020-01-17 (×2): 0.03 [IU]/min via INTRAVENOUS
  Filled 2020-01-16 (×3): qty 100

## 2020-01-16 MED ORDER — PRO-STAT SUGAR FREE PO LIQD
30.0000 mL | Freq: Three times a day (TID) | ORAL | Status: DC
  Administered 2020-01-16 – 2020-01-17 (×3): 30 mL
  Filled 2020-01-16: qty 30

## 2020-01-16 MED ORDER — HEPARIN SOD (PORK) LOCK FLUSH 100 UNIT/ML IV SOLN
500.0000 [IU] | Freq: Once | INTRAVENOUS | Status: AC
  Administered 2020-01-16: 500 [IU] via INTRAVENOUS
  Filled 2020-01-16: qty 5

## 2020-01-16 MED ORDER — HYDROCORTISONE NA SUCCINATE PF 100 MG IJ SOLR
50.0000 mg | Freq: Four times a day (QID) | INTRAMUSCULAR | Status: DC
  Administered 2020-01-16 – 2020-01-17 (×3): 50 mg via INTRAVENOUS
  Filled 2020-01-16 (×3): qty 2

## 2020-01-16 MED ORDER — SODIUM BICARBONATE 8.4 % IV SOLN
INTRAVENOUS | Status: AC
  Administered 2020-01-16: 50 meq via INTRAVENOUS
  Filled 2020-01-16: qty 50

## 2020-01-16 NOTE — Progress Notes (Signed)
Nutrition Follow-up  DOCUMENTATION CODES:   Non-severe (moderate) malnutrition in context of chronic illness  INTERVENTION:  Initiate Vital High Protein at 20 mL/hr and advance by 15 mL/hr every 12 hours to goal rate of 50 mL/hr (1200 mL goal daily volume). Also provide Pro-Stat 30 mL TID per tube. Goal regimen provides 1500 kcal, 150 grams of protein, 1008 mL H2O daily. With current propofol rate provides 1936 kcal daily.  Continue B-complex with C QHS per tube.  Monitor magnesium, potassium, and phosphorus daily for at least 3 days, MD to replete as needed, as pt is at risk for refeeding syndrome.  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  Ongoing.  GOAL:   Patient will meet greater than or equal to 90% of their needs  Progressing with advancement of tube feeds.  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 Patient presented from podiatry office with complaints of significantly increased pain in left foot for 4-5 days.  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 -On 7/25 patient had worsening abdominal distention and episodes of bilious emesis and AMS. He was intubated and started on CRRT. OGT was placed for decompression.  Patient is currently intubated on ventilator support MV: 9 L/min Temp (24hrs), Avg:98.5 F (36.9 C), Min:97.7 F (36.5 C), Max:99 F (37.2  C)  Propofol: 16.5 ml/hr (436 kcal daily)  Medications reviewed and include: B-compex with C QHS per tube, Colace 100 mg BID, Novolog 0-15 units TID, Lantus 10 units QHS, Reglan 10 mg QID IV, MVI daily, Protonix, Miralax, vitamin B12 1000 micrograms daily, albumin 25 grams TID IV, fentanyl gtt, milrinone, norepinephrine gtt at 7 mcg/min, propofol gtt, sodium phosphate 30 mmol IV today.  Labs reviewed: CBG 98-104, Phosphorus 1.9, Magnesium 2.6.  I/O: 30 mL UOP yesterday; 4369 mL removed yesterday from CRRT  Weight trend: 91 kg on 7/28; -4.9 kg from 7/23  Enteral Access: OGT placed 7/26; terminates in proximal stomach per abdominal x-ray 7/26  TF regimen: Vital AF 1.2 Cal at 10 mL/hr  Discussed with RN and on rounds. Patient is tolerating tube feeds. Plan is to advance towards goal. Patient is now on propofol gtt. Patient remains on CRRT.  Diet Order:   Diet Order    None     EDUCATION NEEDS:   No education needs have been identified at this time  Skin:  Skin Assessment: Skin Integrity Issues: Skin Integrity Issues:: Stage III, Incisions, Other (Comment) Stage III: buttocks (3cm x 3cm) Incisions: closed incision left leg Other: ecchymosis  Last BM:  01/11/2020  Height:   Ht Readings from Last 1 Encounters:  01/11/20 6\' 2"  (1.88 m)   Weight:   Wt Readings from Last 1 Encounters:  01/16/20 91 kg   Ideal Body Weight:  86.4 kg  BMI:  Body mass index is 25.76 kg/m.  Estimated Nutritional Needs:   Kcal:  1909 (PSU 2003b w/ MSJ 1697, Ve 9, Tmax 37.2)  Protein:  140-165 grams  Fluid:  1.9 L  Jacklynn Barnacle, MS, RD, LDN Pager number available on Amion

## 2020-01-16 NOTE — Progress Notes (Signed)
SBT performed on PSV 10/5 21%.  Pt completed 1 hr 45 min before failing due to increased agitation and increased RR.

## 2020-01-16 NOTE — Progress Notes (Signed)
Unable to obtain QTc on the cardiac monitor. 12L EKG done which showed QTc=660. Informed NP jeremiah. Precedex switched to propofol.

## 2020-01-16 NOTE — Progress Notes (Signed)
[facility]  ROUNDING NOTE   Subjective:   Patient remains critically ill, vent dependent, pressors and sedated Precedex drip Vent: fio2 21 % requiring pressors: levophed _0  Milrinone infusion CRRT started 7/25; tolerating fair UF 3900 cc, 1370 cc  Pre pump 500 cc/hr BFR 220 cc/hr Dialysate 2000 cc/hr; 4 K Post 500 cc/hr Effluent dose 32 ml/kg/hr   Objective:  Vital signs in last 24 hours:  Temp:  [97.7 F (36.5 C)-99 F (37.2 C)] 97.7 F (36.5 C) (07/28 0752) Pulse Rate:  [68-89] 89 (07/28 0900) Resp:  [16-19] 16 (07/28 0900) BP: (95-128)/(44-65) 108/50 (07/28 0900) SpO2:  [90 %-100 %] 97 % (07/28 0900) FiO2 (%):  [21 %] 21 % (07/28 0837) Weight:  [91 kg] 91 kg (07/28 0233)  Weight change: -0.6 kg Filed Weights   01/14/20 0322 01/15/20 0459 01/16/20 0233  Weight: (!) 92.8 kg 91.6 kg 91 kg    Intake/Output: I/O last 3 completed shifts: In: 2833.1 [I.V.:2155.2; Other:13; NG/GT:253; IV Piggyback:411.9] Out: 6975.2 [Urine:80; Emesis/NG output:250; Other:6645.2]   Intake/Output this shift:  Total I/O In: -  Out: 520 [Other:520]   Physical Exam: General:  Critically ill, laying in the bed,   HEENT  ETT, OGT in place  Pulm/lungs  Vent assisted  CVS/Heart  irregular rhythm, no rub or gallop, paced  Abdomen:   Soft,   Extremities:  ++ dependent peripheral edema, left BKA  Neurologic:  sedated  Foley in place Rt IJ remp cath    Basic Metabolic Panel: Recent Labs  Lab 01/10/20 2146 01/11/20 0443 01/13/20 0723 01/13/20 0723 01/14/20 0509 01/14/20 0509 01/14/20 1611 01/14/20 1611 01/15/20 0409 01/15/20 1550 01/16/20 0358  NA 125*   < > 126*   < > 133*  --  133*  --  135 133* 137  K 5.4*   < > 5.2*   < > 4.5  --  4.2  --  4.2 4.1 4.0  CL 94*   < > 93*   < > 100  --  100  --  101 102 101  CO2 20*   < > 18*   < > 26  --  22  --  _1 GLUCOSE 239*   < > 150*   < > 129*  --  119*  --  112* 96 102*  BUN 98*   < > 112*   < > 73*  --   49*  --  32* 22 14  CREATININE 3.43*   < > 4.19*   < > 2.87*  --  2.11*  --  1.65* 1.31* 1.10  CALCIUM 7.8*   < > 8.1*   < > 7.3*   < > 7.8*   < > 7.9* 8.1* 8.2*  MG 2.2  --  2.4  --  2.3  --   --   --  2.6*  --  2.6*  PHOS  --   --  6.8*   < > 4.9*  --  3.3  --  2.9 2.3* 1.9*   < > = values in this interval not displayed.    Liver Function Tests: Recent Labs  Lab 01/11/20 0443 01/11/20 0443 01/13/20 0723 01/13/20 0723 01/14/20 0509 01/14/20 1611 01/15/20 0409 01/15/20 1550 01/16/20 0358  AST 158*  --  50*  --   --   --   --   --   --   ALT 99*  --  60*  --   --   --   --   --   --  ALKPHOS 239*  --  162*  --   --   --   --   --   --   BILITOT 1.3*  --  1.5*  --   --   --   --   --   --   PROT 6.2*  --  5.9*  --   --   --   --   --   --   ALBUMIN 2.5*   < > 3.3*   < > 3.5 3.7 3.8 3.9 4.3   < > = values in this interval not displayed.   No results for input(s): LIPASE, AMYLASE in the last 168 hours. No results for input(s): AMMONIA in the last 168 hours.  CBC: Recent Labs  Lab 01/13/20 0723 01/13/20 0723 01/14/20 0526 01/14/20 1614 01/14/20 2254 01/15/20 0409 01/16/20 0358  WBC 10.6*  --  7.2  --  11.2* 11.2* 13.9*  NEUTROABS 9.2*  --   --   --   --   --   --   HGB 8.3*   < > 7.6* 7.6* 7.4* 7.4* 7.4*  HCT 24.9*   < > 24.4* 23.2* 24.0* 24.1* 23.1*  MCV 83.0  --  86.2  --  87.9 88.0 86.5  PLT 413*  --  310  --  225 194 171   < > = values in this interval not displayed.    Cardiac Enzymes: No results for input(s): CKTOTAL, CKMB, CKMBINDEX, TROPONINI in the last 168 hours.  BNP: Invalid input(s): POCBNP  CBG: Recent Labs  Lab 01/15/20 1704 01/15/20 2127 01/16/20 0036 01/16/20 0342 01/16/20 0756  GLUCAP 87 89 98 100* 104*    Microbiology: Results for orders placed or performed during the hospital encounter of 12/24/2019  Blood Cultures x 2 sites     Status: None   Collection Time: 01/04/2020  8:48 PM   Specimen: BLOOD  Result Value Ref Range Status    Specimen Description BLOOD BLOOD LEFT FOREARM  Final   Special Requests   Final    BOTTLES DRAWN AEROBIC AND ANAEROBIC Blood Culture adequate volume   Culture   Final    NO GROWTH 5 DAYS Performed at [facility], [address] [place], [place] [address]    Report Status 12/31/2019 FINAL  Final  Culture, blood (Routine X 2) w Reflex to ID Panel     Status: None   Collection Time: 12/27/19  7:01 PM   Specimen: BLOOD  Result Value Ref Range Status   Specimen Description BLOOD BLOOD RIGHT HAND  Final   Special Requests   Final    BOTTLES DRAWN AEROBIC AND ANAEROBIC Blood Culture adequate volume   Culture   Final    NO GROWTH 5 DAYS Performed at [facility], [address] [place], [place] [address]    Report Status 12/20/2019 FINAL  Final  SARS Coronavirus 2 by RT PCR (hospital order, performed in [facility] hospital lab) Nasopharyngeal Nasopharyngeal Swab     Status: None   Collection Time: 12/27/19  8:45 PM   Specimen: Nasopharyngeal Swab  Result Value Ref Range Status   SARS Coronavirus 2 NEGATIVE NEGATIVE Final    Comment: (NOTE) SARS-CoV-2 target nucleic acids are NOT DETECTED.  The SARS-CoV-2 RNA is generally detectable in upper and lower respiratory specimens during the acute phase of infection. The lowest concentration of SARS-CoV-2 viral copies this assay can detect is 250 copies / mL. A negative result does not preclude SARS-CoV-2 infection  and should not be used as the sole basis for treatment or other patient management decisions.  A negative result may occur with improper specimen collection / handling, submission of specimen other than nasopharyngeal swab, presence of viral mutation(s) within the areas targeted by this assay, and inadequate number of viral copies (<250 copies / mL). A negative result must be combined with clinical observations, patient history, and epidemiological information.  Fact Sheet for Patients:    StrictlyIdeas.no  Fact Sheet for Healthcare Providers: BankingDealers.co.za  This test is not yet approved or  cleared by the [place] FDA and has been authorized for detection and/or diagnosis of SARS-CoV-2 by FDA under an Emergency Use Authorization (EUA).  This EUA will remain in effect (meaning this test can be used) for the duration of the COVID-19 declaration under Section 564(b)(1) of the Act, 21 U.S.C. section 360bbb-3(b)(1), unless the authorization is terminated or revoked sooner.  Performed at [facility], [address] [place], [place] [address]   MRSA PCR Screening     Status: None   Collection Time: 12/27/19  8:45 PM   Specimen: Nasopharyngeal  Result Value Ref Range Status   MRSA by PCR NEGATIVE NEGATIVE Final    Comment:        The GeneXpert MRSA Assay (FDA approved for NASAL specimens only), is one component of a comprehensive MRSA colonization surveillance program. It is not intended to diagnose MRSA infection nor to guide or monitor treatment for MRSA infections. Performed at [facility], [address], [place], [place] [address]   Urine Culture     Status: Abnormal   Collection Time: 01/08/20 10:45 AM   Specimen: Urine, Random  Result Value Ref Range Status   Specimen Description   Final    URINE, RANDOM Performed at [facility], [address] [place], [place] [address]    Special Requests   Final    NONE Performed at [facility], [address] [place], [place] [address]    Culture >=100,000 COLONIES/mL YEAST (A)  Final   Report Status 01/09/2020 FINAL  Final    Coagulation Studies: No results for input(s): LABPROT, INR in the last 72 hours.  Urinalysis: No results for input(s): COLORURINE, LABSPEC, PHURINE, GLUCOSEU, HGBUR, BILIRUBINUR, KETONESUR, PROTEINUR, UROBILINOGEN, NITRITE, LEUKOCYTESUR in the last 72 hours.  Invalid input(s):  APPERANCEUR    Imaging: DG Abd 1 View  Result Date: 01/14/2020 CLINICAL DATA:  Orogastric tube placement. EXAM: ABDOMEN - 1 VIEW COMPARISON:  January 13, 2020. FINDINGS: The bowel gas pattern is normal. Distal tip of enteric tube is seen in the proximal stomach. No radio-opaque calculi or other significant radiographic abnormality are seen. IMPRESSION: Distal tip of enteric tube seen in proximal stomach. Electronically Signed   By: Marijo Conception M.D.   On: 01/14/2020 12:51   DG Chest Port 1 View  Result Date: 01/16/2020 CLINICAL DATA:  Hypoxia EXAM: PORTABLE CHEST 1 VIEW COMPARISON:  January 14, 2020 FINDINGS: Endotracheal tube tip is 3.4 cm above the carina. Nasogastric tube tip and side port are below the diaphragm. Central catheter tip is at the cavoatrial junction. Pacemaker present with lead tips attached to the right atrium, right ventricle, and coronary sinus. No pneumothorax. There is a small left pleural effusion. No edema or airspace opacity. Heart is mildly enlarged with slight pulmonary venous hypertension. No evident adenopathy. There is aortic atherosclerosis. IMPRESSION: Tube and catheter positions as described without pneumothorax. Cardiomegaly with pulmonary vascular congestion remains. Small left  pleural effusion present. There may be a degree of underlying congestive heart failure. There is no frank edema or consolidation. Aortic Atherosclerosis (ICD10-I70.0). Electronically Signed   By: Lowella Grip III M.D.   On: 01/16/2020 07:30   DG Chest Port 1 View  Result Date: 01/14/2020 CLINICAL DATA:  Intubation and OG tube placement. EXAM: PORTABLE CHEST 1 VIEW COMPARISON:  None. FINDINGS: The heart is enlarged, exaggerated by low lung volumes. Mild pulmonary vascular congestion is present. Endotracheal tube terminates 4.5 cm above the carina. Side port of the OG tube is in the stomach. IMPRESSION: 1. Cardiomegaly and mild pulmonary vascular congestion. 2. Endotracheal tube terminates 4.5  cm above the carina. 3. Side port of the OG tube is in the stomach. Electronically Signed   By: San Morelle M.D.   On: 01/14/2020 14:11     Medications:   .  prismasol BGK 4/2.5 500 mL/hr at 01/16/20 0421  .  prismasol BGK 4/2.5 500 mL/hr at 01/16/20 0421  . sodium chloride Stopped (01/14/20 2305)  . albumin human Stopped (01/15/20 2359)  . dexmedetomidine (PRECEDEX) IV infusion Stopped (01/16/20 0000)  . fentaNYL infusion INTRAVENOUS 300 mcg/hr (01/16/20 0700)  . milrinone 0.3 mcg/kg/min (01/16/20 0838)  . norepinephrine (LEVOPHED) Adult infusion 7 mcg/min (01/16/20 0940)  . prismasol BGK 4/2.5 2,000 mL/hr at 01/16/20 1003  . propofol (DIPRIVAN) infusion 30 mcg/kg/min (01/16/20 0700)  . sodium phosphate  Dextrose 5% IVPB     . artificial tears   Both Eyes BID  . aspirin  81 mg Per Tube Daily  . B-complex with vitamin C  1 tablet Per Tube QHS  . chlorhexidine gluconate (MEDLINE KIT)  15 mL Mouth Rinse BID  . Chlorhexidine Gluconate Cloth  6 each Topical Daily  . docusate  100 mg Per Tube BID  . feeding supplement (VITAL AF 1.2 CAL)  1,000 mL Per Tube Q24H  . fluticasone  2 spray Each Nare Daily  . gabapentin  100 mg Oral QHS  . heparin  5,000 Units Subcutaneous Q8H  . insulin aspart  0-15 Units Subcutaneous TID WC  . insulin glargine  10 Units Subcutaneous QHS  . lidocaine  1 patch Transdermal Q24H  . loratadine  10 mg Per Tube Daily  . mouth rinse  15 mL Mouth Rinse 10 times per day  . metoCLOPramide (REGLAN) injection  10 mg Intravenous QID  . multivitamin with minerals  1 tablet Per Tube Daily  . pantoprazole (PROTONIX) IV  40 mg Intravenous BID  . polyethylene glycol  17 g Per Tube Daily  . scopolamine  1 patch Transdermal Q72H  . simvastatin  10 mg Per Tube QHS  . sodium chloride flush  3 mL Intravenous Once  . vitamin B-12  1,000 mcg Per Tube Daily   sodium chloride, acetaminophen **OR** acetaminophen, alum & mag hydroxide-simeth, calcium carbonate,  guaiFENesin-dextromethorphan, heparin, midazolam, midazolam, ondansetron **OR** ondansetron (ZOFRAN) IV, oxyCODONE, polyvinyl alcohol, promethazine, sodium phosphate  Assessment/ Plan:   [name] is a 79 y.o. 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 Lucky Cowboy  1. Acute renal failure with hyperkalemia and metabolic acidosis on chronic kidney diease stage IV with baseline creatinine of 2.24, GFR of 26 on 02/11/2018. Chronic kidney disease secondary to diabetic nephropathy, hypertension and obstructive uropathy.  No IV contrast. Renal ultrasound with no obstruction.   Acute renal failure seems to be related to ATN from hemodynamic instability - CRRT initiated on 7/25 - d/c furosemide drip - UF -200 cc/hr - continue albumin for oncotic support. Volume status has improved significantly.  Almost 10 L removed in last 3 days May d/c CRRT this afternoon Check on for HD tomorrow   2. Acute resp failure - vent dependent  3. Chronic systolic CHF and Generalized edema EF 25-30% Volume status improved with CRRT    LOS: 21 [name] 7/28/202110:07 AM

## 2020-01-16 NOTE — Progress Notes (Signed)
PHARMACY CONSULT NOTE - FOLLOW UP  Pharmacy Consult for Electrolyte Monitoring and Replacement   Recent Labs: Potassium (mmol/L)  Date Value  01/16/2020 4.0  08/15/2013 4.7   Magnesium (mg/dL)  Date Value  01/16/2020 2.6 (H)   Calcium (mg/dL)  Date Value  01/16/2020 8.2 (L)   Calcium, Total (mg/dL)  Date Value  08/15/2013 8.7   Albumin (g/dL)  Date Value  01/16/2020 4.3  08/15/2013 3.6   Phosphorus (mg/dL)  Date Value  01/16/2020 1.9 (L)   Sodium (mmol/L)  Date Value  01/16/2020 137  08/06/2015 133 (L)  08/15/2013 130 (L)     Assessment: 79 year old male intubated and sedated in the ICU. Patient is on CRRT with plan to transition to HD 7/29. Patient started on tube feeds 7/27, risk for refeeding syndrome. Pharmacy to help manage electrolytes.  Goal of Therapy:  Electrolytes WNL  Plan:  Sodium phos 30 mmol IV x 1. Follow up with morning labs.  Tawnya Crook ,PharmD Clinical Pharmacist 01/16/2020 2:16 PM

## 2020-01-16 NOTE — Progress Notes (Signed)
Pulled off approximately 2360ml-since around 10am.  CRRT now being discontinued per Dr. Candiss Norse.

## 2020-01-16 NOTE — Progress Notes (Addendum)
Palliative:  [name] is lying quietly in bed in the ICU.  He remains acutely ill, intubated/ventilated and sedated.  He has medications for rate/rhythm control and blood pressure.  There is no family at bedside at this time.   [name] completed 1 hour and 45 minutes of spontaneous breathing trial before he failed.  He continues on continuous hemodialysis, but the goal is to transition him to regular HD tomorrow.  Call to stepdaughter/HC POA, Ginger Strickland.  Somewhat detailed voicemail message left providing update.    Ginger quickly return my call.  We talked about [name] spontaneous breathing trial today, his continuous infusions for blood pressure and heart.  I share that he is to transition to regular hemodialysis.  Ginger tells me that the family has agreed that [name] would not want to continue living if he had to be reliant on hemodialysis outpatient.  We again today talk about time for outcomes, "when is enough enough" compassionate extubation.  We talked about what this would look like and feel like.  Ginger asks appropriate questions.  Plan for family conference tomorrow, time undecided.   Ginger tells me that she thinks of [name] does not progress with HD then would likely transition to comfort care.   Conference with attending, bedside nursing staff, transition of care team related to patient condition, needs, goals of care.  Plan: Continue to treat the treatable but no chest compressions.  Attempting to wean from continuous to intermittent dialysis.  PMT to follow.  21 minutes  Quinn Axe, NP Palliative medicine team Team phone [phone] Greater than 50% of this time was spent counseling and coordinating care related to the above assessment and plan.

## 2020-01-16 NOTE — Consult Note (Signed)
Camp Nurse Consult Note: Reason for Consult: Patient seen on 01/09/20 by my partner, D. Barbie Haggis. At that time, the wound on the sacrum was moisture associated dermatitis. It has now worsened consistent with patient's continuing critical condition requiring intensive care to an Unstageable Pressure Injury. Wound type:Pressure Pressure Injury POA: No Measurement: 4.5cm x 2.5cm Wound OJJ:[id number] discoloration with sloughing of epidermis. The depth of this tissue injury is yet to be determined. Drainage (amount, consistency, odor) small amount of grey/green exudate on old dressing Periwound:intact Dressing procedure/placement/frequency:I will add a xeroform gauze (folded) to the silicone foam dressing already in place. We will change these twice daily in an attempt to provide antimicrobial and astringent properties to the affected area. Patient is being turned and repositioned per house protocol.  [place] nursing team will follow, seeing every 7-10 days, and will remain available to this patient, the nursing and medical teams.   Thanks, Maudie Flakes, MSN, RN, [place], Arther Abbott  Pager# [phone]

## 2020-01-16 NOTE — Progress Notes (Signed)
Progress Note  Patient Name: [name] Date of Encounter: 01/16/2020  [facility]  Subjective   Patient is intubated, sedated undergoing CRRT.  No acute events overnight.  Inpatient Medications    Scheduled Meds: . artificial tears   Both Eyes BID  . aspirin  81 mg Per Tube Daily  . B-complex with vitamin C  1 tablet Per Tube QHS  . chlorhexidine gluconate (MEDLINE KIT)  15 mL Mouth Rinse BID  . Chlorhexidine Gluconate Cloth  6 each Topical Daily  . docusate  100 mg Per Tube BID  . feeding supplement (PRO-STAT SUGAR FREE 64)  30 mL Per Tube TID  . fluticasone  2 spray Each Nare Daily  . gabapentin  100 mg Oral QHS  . heparin  5,000 Units Subcutaneous Q8H  . insulin aspart  0-15 Units Subcutaneous TID WC  . insulin glargine  10 Units Subcutaneous QHS  . lidocaine  1 patch Transdermal Q24H  . loratadine  10 mg Per Tube Daily  . mouth rinse  15 mL Mouth Rinse 10 times per day  . metoCLOPramide (REGLAN) injection  10 mg Intravenous QID  . multivitamin with minerals  1 tablet Per Tube Daily  . pantoprazole (PROTONIX) IV  40 mg Intravenous BID  . polyethylene glycol  17 g Per Tube Daily  . scopolamine  1 patch Transdermal Q72H  . simvastatin  10 mg Per Tube QHS  . sodium chloride flush  3 mL Intravenous Once  . vitamin B-12  1,000 mcg Per Tube Daily   Continuous Infusions: .  prismasol BGK 4/2.5 500 mL/hr at 01/16/20 0421  .  prismasol BGK 4/2.5 500 mL/hr at 01/16/20 0421  . sodium chloride Stopped (01/14/20 2305)  . albumin human 25 g (01/16/20 1051)  . dexmedetomidine (PRECEDEX) IV infusion Stopped (01/16/20 0000)  . feeding supplement (VITAL HIGH PROTEIN) 1,000 mL (01/16/20 1413)  . fentaNYL infusion INTRAVENOUS 300 mcg/hr (01/16/20 1217)  . milrinone 0.3 mcg/kg/min (01/16/20 0838)  . norepinephrine (LEVOPHED) Adult infusion    . prismasol BGK 4/2.5 2,000 mL/hr at 01/16/20 1234  . propofol (DIPRIVAN) infusion 30 mcg/kg/min  (01/16/20 1342)  . sodium phosphate  Dextrose 5% IVPB 30 mmol (01/16/20 1409)   PRN Meds: sodium chloride, acetaminophen **OR** acetaminophen, alum & mag hydroxide-simeth, calcium carbonate, guaiFENesin-dextromethorphan, heparin, midazolam, midazolam, ondansetron **OR** ondansetron (ZOFRAN) IV, oxyCODONE, polyvinyl alcohol, promethazine, sodium phosphate   Vital Signs    Vitals:   01/16/20 1239 01/16/20 1300 01/16/20 1330 01/16/20 1400  BP:  (!) 97/48 (!) 110/48 (!) 112/47  Pulse:  92 98 92  Resp:  _0 Temp:      TempSrc:      SpO2: 92% 91% 93% 95%  Weight:      Height:        Intake/Output Summary (Last 24 hours) at 01/16/2020 1458 Last data filed at 01/16/2020 1413 Gross per 24 hour  Intake 2331.1 ml  Output 5505.2 ml  Net -3174.1 ml   Last 3 Weights 01/16/2020 01/15/2020 01/14/2020  Weight (lbs) 200 lb 9.9 oz 201 lb 15.1 oz 204 lb 9.4 oz  Weight (kg) 91 kg 91.6 kg 92.8 kg      Telemetry    Paced rhythm- Personally Reviewed  ECG    No new tracing- Personally Reviewed  Physical Exam   GEN:  Intubated, sedated Neck: No JVD Cardiac: RRR, no murmurs, rubs, or gallops.  Respiratory:  Vented breath sounds GI: Soft, nontender,  distended  MS:  Left BKA noted Neuro:   Intubated, sedated Psych: Intubated, sedated  Labs    High Sensitivity Troponin:  No results for input(s): TROPONINIHS in the last 720 hours.    Chemistry Recent Labs  Lab 01/11/20 0443 01/12/20 0529 01/13/20 0723 01/14/20 0509 01/15/20 0409 01/15/20 1550 01/16/20 0358  NA 127*   < > 126*   < > 135 133* 137  K 5.9*   < > 5.2*   < > 4.2 4.1 4.0  CL 92*   < > 93*   < > 101 102 101  CO2 20*   < > 18*   < > _0 GLUCOSE 296*   < > 150*   < > 112* 96 102*  BUN 98*   < > 112*   < > 32* 22 14  CREATININE 3.55*   < > 4.19*   < > 1.65* 1.31* 1.10  CALCIUM 8.0*   < > 8.1*   < > 7.9* 8.1* 8.2*  PROT 6.2*  --  5.9*  --   --   --   --   ALBUMIN 2.5*  --  3.3*   < > 3.8 3.9 4.3  AST 158*  --   50*  --   --   --   --   ALT 99*  --  60*  --   --   --   --   ALKPHOS 239*  --  162*  --   --   --   --   BILITOT 1.3*  --  1.5*  --   --   --   --   GFRNONAA 15*   < > 12*   < > 38* 51* >60  GFRAA 18*   < > 14*   < > 44* 59* >60  ANIONGAP 15   < > 15   < > _1 < > = values in this interval not displayed.     Hematology Recent Labs  Lab 01/14/20 2254 01/15/20 0409 01/16/20 0358  WBC 11.2* 11.2* 13.9*  RBC 2.73* 2.74* 2.67*  HGB 7.4* 7.4* 7.4*  HCT 24.0* 24.1* 23.1*  MCV 87.9 88.0 86.5  MCH 27.1 27.0 27.7  MCHC 30.8 30.7 32.0  RDW 22.3* 22.0* 22.0*  PLT 225 194 171    BNPNo results for input(s): BNP, PROBNP in the last 168 hours.   DDimer No results for input(s): DDIMER in the last 168 hours.   Radiology    DG Chest Port 1 View  Result Date: 01/16/2020 CLINICAL DATA:  Hypoxia EXAM: PORTABLE CHEST 1 VIEW COMPARISON:  January 14, 2020 FINDINGS: Endotracheal tube tip is 3.4 cm above the carina. Nasogastric tube tip and side port are below the diaphragm. Central catheter tip is at the cavoatrial junction. Pacemaker present with lead tips attached to the right atrium, right ventricle, and coronary sinus. No pneumothorax. There is a small left pleural effusion. No edema or airspace opacity. Heart is mildly enlarged with slight pulmonary venous hypertension. No evident adenopathy. There is aortic atherosclerosis. IMPRESSION: Tube and catheter positions as described without pneumothorax. Cardiomegaly with pulmonary vascular congestion remains. Small left pleural effusion present. There may be a degree of underlying congestive heart failure. There is no frank edema or consolidation. Aortic Atherosclerosis (ICD10-I70.0). Electronically Signed   By: Lowella Grip III M.D.   On: 01/16/2020 07:30    Cardiac Studies   2D echo 01/10/2020: Reviewed by myself, EF  less than 20%  Patient Profile     79 y.o. male with history of CAD status post CABG x7 in 2005, bradycardia status post  permanent pacemaker, CKD stage III being seen due to heart failure reduced ejection fraction.  Assessment & Plan    1.  Heart failure reduced EF, severely reduced ejection fraction -Echo reviewed by myself with EF less than 20% -Patient remains critically ill, with low blood pressures requiring milrinone and Levophed -With low blood pressures, addition of heart failure therapy contraindicated -Trial of titrating off Levophed can be attempted but I doubt this will be successful in light of on going CRRT  2.  CKD -On CRRT -Management as per renal  3.  Respiratory failure with -Intubated and sedated -Management as per ICU team  Overall difficult case, critically ill patient requiring pressors, and patient having multiorgan failure.  Total encounter time 35 minutes  Greater than 50% was spent in counseling and coordination of care with the patient and nursing staff.       Signed, Kate Sable, MD  01/16/2020, 2:58 PM

## 2020-01-16 NOTE — Plan of Care (Signed)
  Problem: Pain Managment: Goal: General experience of comfort will improve Outcome: Progressing   Problem: Safety: Goal: Ability to remain free from injury will improve Outcome: Progressing   Problem: Clinical Measurements: Goal: Will remain free from infection Outcome: Not Progressing   Problem: Activity: Goal: Risk for activity intolerance will decrease Outcome: Not Progressing   Problem: Elimination: Goal: Will not experience complications related to bowel motility Outcome: Not Progressing

## 2020-01-16 NOTE — Plan of Care (Signed)
  Problem: Clinical Measurements: Goal: Will remain free from infection Outcome: Not Progressing   Problem: Activity: Goal: Risk for activity intolerance will decrease Outcome: Not Progressing   Problem: Elimination: Goal: Will not experience complications related to bowel motility Outcome: Not Progressing   Problem: Pain Managment: Goal: General experience of comfort will improve Outcome: Not Progressing   Problem: Safety: Goal: Ability to remain free from injury will improve Outcome: Not Progressing

## 2020-01-16 NOTE — Progress Notes (Addendum)
Performed sedation vacation- patient tolerated 10/5 for approximately 1 hr and 45 min.  Patient never followed commands- but was trying to talk over the vent- patient would get agitated at times but then he would relax- towards the end of the SBT- patient became tachypneic in high 30's low 40's and had increased agitation.  Patient then placed back on a rate and resedated.  Dr. Mortimer Fries notified.  Per Dr. Candiss Norse- pull 2 liters off patient by the end of my shift today and then stop CRRT- patient to start HD tomorrow.

## 2020-01-16 NOTE — Progress Notes (Signed)
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 Patient was started on IV vancomycin, Rocephin and Flagyl, podiatry and vascular surgeon were consulted. Patient had a angiogram and revascularization on 7/12. But the foot gangrene does not seem to be improving, as a result, planning for below-knee amputation. 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 Cardiology saw patient and recommendation for milrinone drip with diuresis. PCCM consult for further evaluation and management.   01/13/20- patient has worseningabdominal distension andhe had few more episodes of bilious vomitus, he is altered with encephalopathy and is clinically declining. Goals of Care: Family brought in written advanced directive. Patient wishes to be full code and an assigned POA is named (daughter Ginger) in case he is unable to make decisions. Ginger is present during [place] discussion. We reviewed GI recommendation for NGT decompression and nephrology recommendation for dialysis and cardiology favor CVP trending while on milrinone gtt. They whish to proceed with these procedures. Additionally we discussed continued vomiting in context of lethargy with shock and they do wish for patient to be intubated for airway protection. They do understand that patient is overall with poor prognosis and wish to attept at helping him through this but will not wish for prolonged aggressive care if despite full scope of therapy patient does not improve. 7/26 remains intubated,sedated on CRRT on pressors +multiorgan failure, family updated  several times 7/27 on pressors, on vent, on CRRT, failed weaning trials 7/28 on pressors, on vent, on CRRT   CC  follow up respiratory failure  SUBJECTIVE Patient remains critically ill Prognosis is guarded   BP (!) 109/48 (BP Location: Right Arm)   Pulse 73   Temp 98.8 F (37.1 C) (Oral)   Resp 16   Ht _0  (1.88 m)   Wt 91 kg   SpO2 97%   BMI 25.76 kg/m    I/O last 3 completed shifts: In: 2833.1 [I.V.:2155.2; Other:13; NG/GT:253; IV Piggyback:411.9] Out: 6975.2 [Urine:80; Emesis/NG output:250; Other:6645.2] No intake/output data recorded.  SpO2: 97 % O2 Flow Rate (L/min): 2 L/min FiO2 (%): 21 %  Estimated body mass index is 25.76 kg/m as calculated from the following:   Height as of this encounter: _1  (1.88 m).   Weight as of this encounter: 91 kg.  SIGNIFICANT EVENTS   REVIEW OF SYSTEMS  PATIENT IS UNABLE TO PROVIDE COMPLETE REVIEW OF SYSTEMS DUE TO SEVERE CRITICAL ILLNESS   Pressure Injury 12/29/19 Buttocks Medial Stage 3 -  Full thickness tissue loss. Subcutaneous fat may be visible but bone, tendon or muscle are NOT exposed. Evolved to patchy areas of partial thickness wounds related to moisture associated skin damage whe (Active)  12/29/19 0442  Location: Buttocks  Location Orientation: Medial  Staging: Stage 3 -  Full thickness tissue loss. Subcutaneous fat may be visible but bone, tendon or muscle are NOT exposed.  Wound Description (Comments): Evolved to patchy areas of partial thickness wounds related to moisture associated skin damage when assessed on 7/21  Present on Admission: Yes (Pt reported area was present before admission)      PHYSICAL EXAMINATION:  GENERAL:critically ill appearing, +resp distress HEAD: Normocephalic, atraumatic.  EYES: Pupils equal, round, reactive to light.  No scleral icterus.  MOUTH: Moist  mucosal membrane. NECK: Supple.  PULMONARY: +rhonchi, +wheezing CARDIOVASCULAR: S1 and S2. Regular rate and rhythm. No  murmurs, rubs, or gallops.  GASTROINTESTINAL: Soft, nontender, -distended.  Positive bowel sounds.   MUSCULOSKELETAL: LT BKA NEUROLOGIC: obtunded, GCS<8 SKIN:intact,warm,dry  MEDICATIONS: I have reviewed all medications and confirmed regimen as documented   CULTURE RESULTS   Recent Results (from the past 240 hour(s))  Urine Culture     Status: Abnormal   Collection Time: 01/08/20 10:45 AM   Specimen: Urine, Random  Result Value Ref Range Status   Specimen Description   Final    URINE, RANDOM Performed at [facility], [address] [place], [place] [address]    Special Requests   Final    NONE Performed at [facility], [address] [place], [place] [address]    Culture >=100,000 COLONIES/mL YEAST (A)  Final   Report Status 01/09/2020 FINAL  Final          IMAGING    DG Chest Port 1 View  Result Date: 01/16/2020 CLINICAL DATA:  Hypoxia EXAM: PORTABLE CHEST 1 VIEW COMPARISON:  January 14, 2020 FINDINGS: Endotracheal tube tip is 3.4 cm above the carina. Nasogastric tube tip and side port are below the diaphragm. Central catheter tip is at the cavoatrial junction. Pacemaker present with lead tips attached to the right atrium, right ventricle, and coronary sinus. No pneumothorax. There is a small left pleural effusion. No edema or airspace opacity. Heart is mildly enlarged with slight pulmonary venous hypertension. No evident adenopathy. There is aortic atherosclerosis. IMPRESSION: Tube and catheter positions as described without pneumothorax. Cardiomegaly with pulmonary vascular congestion remains. Small left pleural effusion present. There may be a degree of underlying congestive heart failure. There is no frank edema or consolidation. Aortic Atherosclerosis (ICD10-I70.0). Electronically Signed   By: Lowella Grip III M.D.   On: 01/16/2020 07:30     Nutrition Status: Nutrition Problem: Moderate Malnutrition Etiology: chronic illness (CKD, hx chronic  left foot ulcer now s/p left BKA) Signs/Symptoms: moderate fat depletion, moderate muscle depletion Interventions: Refer to RD note for recommendations     Indwelling Urinary Catheter continued, requirement due to   Reason to continue Indwelling Urinary Catheter strict Intake/Output monitoring for hemodynamic instability   Central Line/ continued, requirement due to  Reason to continue [place] of central venous pressure or other hemodynamic parameters and poor IV access   Ventilator continued, requirement due to severe respiratory failure   Ventilator Sedation RASS 0 to -2      ASSESSMENT AND PLAN SYNOPSIS 79 yo male with progressive hypoxic resp failure from acute and severe decompensated systolic heart failure likely from demand ischemia from extensive vasc surgery complicated by abd distention and renal failure with acidosis and signs and symptoms of ileus with severe metabolic encephalopathy  Severe ACUTE Hypoxic and Hypercapnic Respiratory Failure -continue Full MV support -continue Bronchodilator Therapy -Wean Fio2 and PEEP as tolerated -will perform SAT/SBT when respiratory parameters are met -VAP/VENT bundle implementation  ACUTE SYSTOLIC CARDIAC FAILURE- EF 30% vetn support Milrinone infusion Follow up cardiology recs  ACUTE KIDNEY INJURY/Renal Failure -continue Foley Catheter-assess need -Avoid nephrotoxic agents -Follow urine output, BMP -Ensure adequate renal perfusion, optimize oxygenation -Renal dose medications On CRRT     NEUROLOGY - intubated and sedated - minimal sedation to achieve a RASS goal: -1 Wake up assessment pending   SHOCK-CARDIOGENIC -use vasopressors to keep MAP>65 -follow ABG and LA -follow up cultures -[place] ICU monitoring  ID -continue IV abx as prescibed -follow up cultures  GI GI PROPHYLAXIS as indicated   DIET-->TF's as tolerated Constipation protocol as indicated  ENDO - will  use ICU hypoglycemic\Hyperglycemia protocol if indicated     ELECTROLYTES -follow labs as needed -replace as needed -pharmacy consultation and following   DVT/GI PRX ordered and assessed TRANSFUSIONS AS NEEDED MONITOR FSBS I Assessed the need for Labs I Assessed the need for Foley I Assessed the need for Central Venous Line Family Discussion when available I Assessed the need for Mobilization I made an Assessment of medications to be adjusted accordingly Safety Risk assessment completed   CASE DISCUSSED IN MULTIDISCIPLINARY ROUNDS WITH ICU TEAM  Critical Care Time devoted to patient care services described in this note is 35 minutes.   Overall, patient is critically ill, prognosis is guarded.  Patient with Multiorgan failure and at high risk for cardiac arrest and death.   Patient is DNR, recommend one way extubation and Comfort measures, palliative care team following  Corrin Parker, M.D.  [facility]  Medical Director [place] Director [facility] Cardio-Pulmonary Department

## 2020-01-16 NOTE — Progress Notes (Signed)
Shift summary:  - Patient remains intubated, sedated, requiring vasopressors. - Pressor requirement increasing so far this shift. Vaso added in addition to Levo.

## 2020-01-17 ENCOUNTER — Inpatient Hospital Stay: Payer: No Typology Code available for payment source

## 2020-01-17 DIAGNOSIS — I25118 Atherosclerotic heart disease of native coronary artery with other forms of angina pectoris: Secondary | ICD-10-CM

## 2020-01-17 DIAGNOSIS — L03116 Cellulitis of left lower limb: Secondary | ICD-10-CM | POA: Diagnosis not present

## 2020-01-17 DIAGNOSIS — L97523 Non-pressure chronic ulcer of other part of left foot with necrosis of muscle: Secondary | ICD-10-CM | POA: Diagnosis not present

## 2020-01-17 DIAGNOSIS — Z89519 Acquired absence of unspecified leg below knee: Secondary | ICD-10-CM

## 2020-01-17 DIAGNOSIS — N179 Acute kidney failure, unspecified: Secondary | ICD-10-CM | POA: Diagnosis not present

## 2020-01-17 DIAGNOSIS — Z515 Encounter for palliative care: Secondary | ICD-10-CM

## 2020-01-17 DIAGNOSIS — E08621 Diabetes mellitus due to underlying condition with foot ulcer: Secondary | ICD-10-CM | POA: Diagnosis not present

## 2020-01-17 DIAGNOSIS — Z7189 Other specified counseling: Secondary | ICD-10-CM | POA: Diagnosis not present

## 2020-01-17 DIAGNOSIS — I5023 Acute on chronic systolic (congestive) heart failure: Secondary | ICD-10-CM | POA: Diagnosis not present

## 2020-01-17 LAB — CBC
HCT: 27 % — ABNORMAL LOW (ref 39.0–52.0)
Hemoglobin: 7.7 g/dL — ABNORMAL LOW (ref 13.0–17.0)
MCH: 26.8 pg (ref 26.0–34.0)
MCHC: 28.5 g/dL — ABNORMAL LOW (ref 30.0–36.0)
MCV: 94.1 fL (ref 80.0–100.0)
Platelets: 216 10*3/uL (ref 150–400)
RBC: 2.87 MIL/uL — ABNORMAL LOW (ref 4.22–5.81)
RDW: 21.2 % — ABNORMAL HIGH (ref 11.5–15.5)
WBC: 20.3 10*3/uL — ABNORMAL HIGH (ref 4.0–10.5)
nRBC: 0.1 % (ref 0.0–0.2)

## 2020-01-17 LAB — RENAL FUNCTION PANEL
Albumin: 5 g/dL (ref 3.5–5.0)
Anion gap: 23 — ABNORMAL HIGH (ref 5–15)
BUN: 16 mg/dL (ref 8–23)
CO2: 17 mmol/L — ABNORMAL LOW (ref 22–32)
Calcium: 8.4 mg/dL — ABNORMAL LOW (ref 8.9–10.3)
Chloride: 98 mmol/L (ref 98–111)
Creatinine, Ser: 1.86 mg/dL — ABNORMAL HIGH (ref 0.61–1.24)
GFR calc Af Amer: 38 mL/min — ABNORMAL LOW (ref >60)
GFR calc non Af Amer: 33 mL/min — ABNORMAL LOW (ref >60)
Glucose, Bld: 234 mg/dL — ABNORMAL HIGH (ref 70–99)
Phosphorus: 7.2 mg/dL — ABNORMAL HIGH (ref 2.5–4.6)
Potassium: 5.3 mmol/L — ABNORMAL HIGH (ref 3.5–5.1)
Sodium: 138 mmol/L (ref 135–145)

## 2020-01-17 LAB — GLUCOSE, CAPILLARY
Glucose-Capillary: 257 mg/dL — ABNORMAL HIGH (ref 70–99)
Glucose-Capillary: 219 mg/dL — ABNORMAL HIGH (ref 70–99)

## 2020-01-17 LAB — MAGNESIUM: Magnesium: 2.7 mg/dL — ABNORMAL HIGH (ref 1.7–2.4)

## 2020-01-17 MED ORDER — ACETAMINOPHEN 325 MG PO TABS: 650.0000 mg | ORAL_TABLET | Freq: Four times a day (QID) | ORAL | Status: DC | PRN

## 2020-01-17 MED ORDER — GLYCOPYRROLATE 0.2 MG/ML IJ SOLN
0.2000 mg | INTRAMUSCULAR | Status: DC | PRN
  Administered 2020-01-17: 0.2 mg via INTRAVENOUS

## 2020-01-17 MED ORDER — HALOPERIDOL LACTATE 2 MG/ML PO CONC
0.5000 mg | ORAL | Status: DC | PRN
  Filled 2020-01-17: qty 0.3

## 2020-01-17 MED ORDER — ONDANSETRON HCL 4 MG/2ML IJ SOLN: 4.0000 mg | Freq: Four times a day (QID) | INTRAMUSCULAR | Status: DC | PRN

## 2020-01-17 MED ORDER — ONDANSETRON 4 MG PO TBDP
4.0000 mg | ORAL_TABLET | Freq: Four times a day (QID) | ORAL | Status: DC | PRN
  Filled 2020-01-17: qty 1

## 2020-01-17 MED ORDER — GLYCOPYRROLATE 1 MG PO TABS
1.0000 mg | ORAL_TABLET | ORAL | Status: DC | PRN
  Filled 2020-01-17: qty 1

## 2020-01-17 MED ORDER — HALOPERIDOL LACTATE 5 MG/ML IJ SOLN
0.5000 mg | INTRAMUSCULAR | Status: DC | PRN
  Administered 2020-01-17: 0.5 mg via INTRAVENOUS
  Filled 2020-01-17: qty 1

## 2020-01-17 MED ORDER — BIOTENE DRY MOUTH MT LIQD: 15.0000 mL | OROMUCOSAL | Status: DC | PRN

## 2020-01-17 MED ORDER — ACETAMINOPHEN 650 MG RE SUPP: 650.0000 mg | Freq: Four times a day (QID) | RECTAL | Status: DC | PRN

## 2020-01-17 MED ORDER — MORPHINE 100MG IN NS 100ML (1MG/ML) PREMIX INFUSION
2.0000 mg/h | INTRAVENOUS | Status: DC
  Administered 2020-01-17: 2 mg/h via INTRAVENOUS
  Filled 2020-01-17: qty 100

## 2020-01-17 MED ORDER — GLYCOPYRROLATE 0.2 MG/ML IJ SOLN
0.2000 mg | INTRAMUSCULAR | Status: DC | PRN
  Filled 2020-01-17: qty 1

## 2020-01-17 MED ORDER — POLYVINYL ALCOHOL 1.4 % OP SOLN
1.0000 [drp] | Freq: Four times a day (QID) | OPHTHALMIC | Status: DC | PRN
  Filled 2020-01-17: qty 15

## 2020-01-17 MED ORDER — HALOPERIDOL 0.5 MG PO TABS
0.5000 mg | ORAL_TABLET | ORAL | Status: DC | PRN
  Filled 2020-01-17: qty 1

## 2020-01-17 MED FILL — Vasopressin IV Soln 20 Unit/ML (For IV Infusion): INTRAVENOUS | Qty: 1 | Status: AC

## 2020-01-17 MED FILL — Sodium Chloride IV Soln 0.9%: INTRAVENOUS | Qty: 100 | Status: AC

## 2020-01-20 NOTE — Progress Notes (Signed)
Nutrition Brief Follow-Up Note  Chart reviewed. Patient now transitioning to comfort care.   No further nutrition interventions warranted at this time. Please re-consult RD as needed.   Sundeep Destin King, MS, RD, LDN Pager number available on Amion   

## 2020-01-20 NOTE — Progress Notes (Signed)
Inpatient Diabetes Program Recommendations  AACE/ADA: New Consensus Statement on Inpatient Glycemic Control  Target Ranges:  Prepandial:   less than 140 mg/dL      Peak postprandial:   less than 180 mg/dL (1-2 hours)      Critically ill patients:  140 - 180 mg/dL   Results for [name], [name] (MRN [id number]) as of 22-Jan-2020 11:52  Ref. Range 01/16/2020 07:56 01/16/2020 11:40 01/16/2020 16:17 01/16/2020 22:44 01-22-20 07:56  Glucose-Capillary Latest Ref Range: 70 - 99 mg/dL 104 (H) 122 (H) 95 159 (H) 257 (H)   Review of Glycemic Control  Diabetes history: DM2 Outpatient Diabetes medications: Lantus 18 units daily Current orders for Inpatient glycemic control: Lantus 10 units QHS, Novolog 0-15 units TID with meals; Solucortef 50 mg Q6H, Vital @ 50 ml/hr   Inpatient Diabetes Program Recommendations:    Insulin-Please consider change CBGs to Q4H and Novolog 0-15 units to Q4H. If glucose continues to be consistently over 180 mg/dl, consider ordering Novolog 2 units Q4H for tube feeding coverage.  Thanks, Barnie Alderman, RN, MSN, CDE Diabetes Coordinator Inpatient Diabetes Program [phone] (Team Pager from 8am to 5pm)

## 2020-01-20 NOTE — Progress Notes (Signed)
Palliative:  Mr. Mccammon remains acutely/chronically ill and very frail.  He is intubated/ventilated and sedated.  He is on vasopressor for blood pressure and milrinone for his heart.  There is no family at bedside at this time.  Call to daughter, Johney Frame for family meeting.  Present today for this telephone meeting is CCM attending, Dr. Mortimer Fries.  Ginger is able to assemble her family on the phone to talk about care for Mr. Grunewald.  Present on today's call is his spouse Hoyle Sauer, and adult children/stepchildren Ginger Burt Ek, [place] and [place].  We talk in detail about Mr. Gudgel's acute health concerns and his declines.  Family states that they are ready for comfort and dignity, to let nature take its course.  We talked about anticipated prognosis, Hours.  All questions answered, psychosocial support provided with family.  We talked about comfort and dignity, let nature take its course, a loving way to care for Mr. Delsanto.  We talked about compassionate extubation, what this would look like and feel like for him.   Conference with attending, bedside nursing staff, transition of care team, respiratory therapy related to patient condition, needs, comfort measures only, compassionate extubation.  End-of-life order set implemented.  Plan:   Compassionate extubation to comfort measures.   Prognosis: Anticipate in hospital death within hours.  71 minutes  Quinn Axe, NP Palliative medicine team  Team phone [phone] Greater than 50% of this time was spent counseling and coordinating care related to the above assessment and plan.

## 2020-01-20 NOTE — Progress Notes (Signed)
Per Family patient transitioned to comfort care. Patient pronounced by this RN and Joycelyn Das RN. No audible heart sounds during auscultation, no palpable pulse, sensation of respirations. Patients family at bedside. Dr. Mortimer Fries notified and supervisor. [place] donor ref [phone] patient is not a candidate for organ or tissue donation. Patients white/yellow ring and clothing returned to [place] daughter.

## 2020-01-20 NOTE — Death Summary Note (Signed)
DEATH SUMMARY   Patient Details  Name: [name] MRN: [id number] DOB: [birth year]  Admission/Discharge Information   Admit Date:  Dec 27, 2019  Date of Death:   01/18/2020   Time of Death:  202PM  Length of Stay: 22  Referring Physician: Olin Hauser, DO     Diagnoses  Preliminary cause of death: ISCHEMIC CARDIOMYOPATHY, DM,  Secondary Diagnoses (including complications and co-morbidities):  Principal Problem:   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 ([place]) Active Problems:   Coronary artery disease   Hypertension associated with diabetes ([place])   Urinary retention   Type 2 diabetes mellitus with foot ulcer ([place])   CKD (chronic kidney disease), stage III   Cellulitis of left foot   Hyperlipidemia associated with type 2 diabetes mellitus ([place])   Malnutrition of moderate degree   Acute on chronic systolic heart failure (HCC)   Goals of care, counseling/discussion   Palliative care by specialist   Acute kidney failure, unspecified ([place])   End of life care    79 y.o.Caucasian malewith medical history significant forCAD s/p CABG,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 Patient was started on IV vancomycin, Rocephin and Flagyl, podiatry and vascular surgeon were consulted. Patient had a angiogram and revascularization on 7/12. But the foot gangrene does not seem to be improving, as a result, planning for below-knee amputation. 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 Cardiology saw patient and recommendation for milrinone drip with diuresis. PCCM consult for further evaluation and management.   01/13/20- patient has worseningabdominal  distension andhe had few more episodes of bilious vomitus, he is altered with encephalopathy and is clinically declining. Goals of Care: Family brought in written advanced directive. Patient wishes to be full code and an assigned POA is named (daughter Ginger) in case he is unable to make decisions. Ginger is present during [place] discussion. We reviewed GI recommendation for NGT decompression and nephrology recommendation for dialysis and cardiology favor CVP trending while on milrinone gtt. They whish to proceed with these procedures. Additionally we discussed continued vomiting in context of lethargy with shock and they do wish for patient to be intubated for airway protection. They do understand that patient is overall with poor prognosis and wish to attept at helping him through this but will not wish for prolonged aggressive care if despite full scope of therapy patient does not improve. 7/26 remains intubated,sedated on CRRT on pressors +multiorgan failure, family updated several times 7/27 on pressors, on vent, on CRRT, failed weaning trials 7/28 on pressors, on vent, on CRRT January 18, 2023 end stage CHF, severe cardiogenic shock, multiorgan failure   The Clinical status was relayed to family in detail.  Updated and notified of patients medical condition.  Patient with Progressive multiorgan failure with very low chance of meaningful recovery despite all aggressive and optimal medical therapy.  Patient is in the dying  Process associated with suffering.  Family understands the situation.  They have consented and agreed to DNR/DNI and would like to proceed with Comfort care measures.  Family are satisfied with Plan of action and management. All questions answered        Pertinent Labs and Studies  Significant Diagnostic Studies DG Abd 1 View  Result Date: 01/14/2020 CLINICAL DATA:  Orogastric tube placement. EXAM:  ABDOMEN - 1 VIEW COMPARISON:  January 13, 2020. FINDINGS: The bowel  gas pattern is normal. Distal tip of enteric tube is seen in the proximal stomach. No radio-opaque calculi or other significant radiographic abnormality are seen. IMPRESSION: Distal tip of enteric tube seen in proximal stomach. Electronically Signed   By: Marijo Conception M.D.   On: 01/14/2020 12:51   DG Abd 1 View  Result Date: 01/13/2020 CLINICAL DATA:  Small-bowel obstruction EXAM: ABDOMEN - 1 VIEW COMPARISON:  01/09/2020 FINDINGS: The stomach is gas distended. The small bowel and colon are gas-filled, however nondistended, with gas present to the rectum. No significant burden of stool. No free air in the abdomen on supine radiographs. Vascular calcinosis. IMPRESSION: The stomach is gas distended. The small bowel and colon are gas-filled, however nondistended, with gas present to the rectum. No significant burden of stool. Radiographic findings are not significant changed compared to prior examination dated 01/09/2020. Electronically Signed   By: Eddie Candle M.D.   On: 01/13/2020 10:07   DG Abd 1 View  Result Date: 01/09/2020 CLINICAL DATA:  Vomiting and diarrhea for few days, abdominal pain EXAM: ABDOMEN - 1 VIEW COMPARISON:  12/29/2019 FINDINGS: Nonobstructive bowel gas pattern. Scattered stool in colon. No bowel dilatation or bowel wall thickening. Gaseous distention of stomach increased from previous exam. Bones demineralized with degenerative changes of lumbar spine and BILATERAL hip joints. Extensive atherosclerotic calcifications. Pacemaker leads RIGHT atrium, RIGHT ventricle, and coronary sinus. IMPRESSION: Increased gaseous distention of stomach. Nonobstructive bowel gas pattern. Aortic Atherosclerosis (ICD10-I70.0). Electronically Signed   By: Lavonia Dana M.D.   On: 01/09/2020 10:25   DG Abd 1 View  Result Date: 12/29/2019 CLINICAL DATA:  Nausea and vomiting EXAM: ABDOMEN - 1 VIEW COMPARISON:  07/31/2015 FINDINGS: Two supine frontal views of the abdomen and pelvis demonstrate an unremarkable  bowel gas pattern. No masses or abnormal calcifications. Lung bases are clear. No acute bony abnormalities. IMPRESSION: 1. Unremarkable bowel gas pattern. Electronically Signed   By: Randa Ngo M.D.   On: 12/29/2019 19:31   US RENAL  Result Date: 01/10/2020 CLINICAL DATA:  Acute kidney failure. EXAM: RENAL / URINARY TRACT ULTRASOUND COMPLETE COMPARISON:  Renal ultrasound 07/30/2015 FINDINGS: Right Kidney: Renal measurements: 12.1 x 5.9 x 6.3 cm = volume: 238 mL . Echogenicity within normal limits. There is a cyst in the superior pole measuring 1.1 x 1.0 x 1.0 cm. There is another cyst in the inferior pole measuring 2.3 x 2.1 x 2.4 cm. Left Kidney: Renal measurements: 12.1 x 5.6 x 6.5 cm = volume: 230 mL. Echogenicity within normal limits. No mass or hydronephrosis visualized. Bladder: Not visualized.  Patient has suprapubic catheter. Other: None. IMPRESSION: No acute finding sonographically in the bilateral kidneys. Electronically Signed   By: Audie Pinto M.D.   On: 01/10/2020 12:46   PERIPHERAL VASCULAR CATHETERIZATION  Result Date: 12/30/2019 See op note  DG Chest Port 1 View  Result Date: 2020/02/14 CLINICAL DATA:  Acute respiratory failure. EXAM: PORTABLE CHEST 1 VIEW COMPARISON:  01/16/2020 FINDINGS: Endotracheal tube, NG tube, right IJ line in stable position. AICD in stable position. Prior CABG. Cardiomegaly with pulmonary venous congestion again noted. Bilateral pulmonary infiltrates/edema noted. Small bilateral pleural effusions. Findings suggest CHF. Bilateral pneumonia cannot be excluded. Degenerative change thoracic spine. IMPRESSION: 1.  Lines and tubes in stable position. 2. AICD in stable position. Prior CABG. Cardiomegaly with pulmonary venous congestion, bilateral pulmonary infiltrates/edema, and small bilateral pleural effusions suggesting CHF. Electronically Signed  By: [place]   On: 20-Jan-2020 05:52   DG Chest Port 1 View  Result Date: 01/16/2020 CLINICAL DATA:   Hypoxia EXAM: PORTABLE CHEST 1 VIEW COMPARISON:  January 14, 2020 FINDINGS: Endotracheal tube tip is 3.4 cm above the carina. Nasogastric tube tip and side port are below the diaphragm. Central catheter tip is at the cavoatrial junction. Pacemaker present with lead tips attached to the right atrium, right ventricle, and coronary sinus. No pneumothorax. There is a small left pleural effusion. No edema or airspace opacity. Heart is mildly enlarged with slight pulmonary venous hypertension. No evident adenopathy. There is aortic atherosclerosis. IMPRESSION: Tube and catheter positions as described without pneumothorax. Cardiomegaly with pulmonary vascular congestion remains. Small left pleural effusion present. There may be a degree of underlying congestive heart failure. There is no frank edema or consolidation. Aortic Atherosclerosis (ICD10-I70.0). Electronically Signed   By: Lowella Grip III M.D.   On: 01/16/2020 07:30   DG Chest Port 1 View  Result Date: 01/14/2020 CLINICAL DATA:  Intubation and OG tube placement. EXAM: PORTABLE CHEST 1 VIEW COMPARISON:  None. FINDINGS: The heart is enlarged, exaggerated by low lung volumes. Mild pulmonary vascular congestion is present. Endotracheal tube terminates 4.5 cm above the carina. Side port of the OG tube is in the stomach. IMPRESSION: 1. Cardiomegaly and mild pulmonary vascular congestion. 2. Endotracheal tube terminates 4.5 cm above the carina. 3. Side port of the OG tube is in the stomach. Electronically Signed   By: San Morelle M.D.   On: 01/14/2020 14:11   DG Chest Port 1 View  Result Date: 01/13/2020 CLINICAL DATA:  Intubation, orogastric tube placement EXAM: PORTABLE CHEST 1 VIEW COMPARISON:  Portable exam 1600 hours compared to 08/11/2015 FINDINGS: Rotated to the RIGHT. Tip of endotracheal tube projects 4.6 cm above carina. Nasogastric tube extends into stomach. RIGHT jugular central venous catheter with tip projecting over cavoatrial junction.  LEFT subclavian transvenous pacemaker with leads projecting over RIGHT atrium, RIGHT ventricle, and coronary sinus. Upper normal size of cardiac silhouette. Mediastinal contours and pulmonary vascularity normal for degree of rotation. Bibasilar atelectasis and small RIGHT pleural effusion. Mild perihilar infiltrates which could represent edema or infection. No pneumothorax. Gaseous distention of stomach. IMPRESSION: Line and tube positions as above. Small RIGHT pleural effusion, bibasilar atelectasis and perihilar infiltrates question edema versus infection. Electronically Signed   By: Lavonia Dana M.D.   On: 01/13/2020 18:10   DG Abd Portable 1V  Result Date: 01/13/2020 CLINICAL DATA:  79 year old male status post orogastric tube placement. EXAM: PORTABLE ABDOMEN - 1 VIEW COMPARISON:  01/13/2020. FINDINGS: Interval placement of enteric tube with tip projecting over the proximal body of the stomach. There is persistent gaseous distension of the stomach. Several dilated loops of gas-filled small bowel are again noted measuring up to 4 cm in the left side of the abdomen. Pacemaker leads projecting over the inferior aspect of the heart. IMPRESSION: 1. Tip of enteric tube in the proximal body of the stomach. 2. Persistent gaseous distension of the stomach and small bowel, concerning for bowel obstruction. Electronically Signed   By: Vinnie Langton M.D.   On: 01/13/2020 18:09   ECHOCARDIOGRAM COMPLETE  Result Date: 01/11/2020    ECHOCARDIOGRAM REPORT   Patient Name:   ABHI MOCCIA Date of Exam: 01/10/2020 Medical Rec #:  [id number]      Height:       74.0 in Accession #:    [id number]     Weight:  167.0 lb Date of Birth:  1940/12/10      BSA:          2.013 m Patient Age:    61 years       BP:           108/79 mmHg Patient Gender: M              HR:           88 bpm. Exam Location:  [facility] Procedure: 2D Echo, Cardiac Doppler and Color Doppler Indications:     CHF-Acute Systolic 703.50 / K93.81  History:          Patient has no prior history of Echocardiogram examinations.                  Risk Factors:Hypertension.  Sonographer:     Alyse Low Roar Referring Phys:  [id number] [facility] Diagnosing Phys: Bartholome Bill MD IMPRESSIONS  1. Left ventricular ejection fraction, by estimation, is 30 to 35%. The left ventricle has moderately decreased function. The left ventricle demonstrates global hypokinesis. The left ventricular internal cavity size was mildly dilated. Left ventricular diastolic parameters were normal.  2. Right ventricular systolic function was not well visualized. The right ventricular size is mildly enlarged. There is moderately elevated pulmonary artery systolic pressure.  3. Left atrial size was mildly dilated.  4. The mitral valve is grossly normal. Mild mitral valve regurgitation.  5. Tricuspid valve regurgitation is mild to moderate.  6. The aortic valve is grossly normal. Aortic valve regurgitation is trivial. FINDINGS  Left Ventricle: Left ventricular ejection fraction, by estimation, is 30 to 35%. The left ventricle has moderately decreased function. The left ventricle demonstrates global hypokinesis. The left ventricular internal cavity size was mildly dilated. There is no left ventricular hypertrophy. Left ventricular diastolic parameters were normal. Right Ventricle: The right ventricular size is mildly enlarged. No increase in right ventricular wall thickness. Right ventricular systolic function was not well visualized. There is moderately elevated pulmonary artery systolic pressure. The tricuspid regurgitant velocity is 3.08 m/s, and with an assumed right atrial pressure of 10 mmHg, the estimated right ventricular systolic pressure is 16.9 mmHg. Left Atrium: Left atrial size was mildly dilated. Right Atrium: Right atrial size was not well visualized. Pericardium: There is no evidence of pericardial effusion. Mitral Valve: The mitral valve is grossly normal. Mild mitral valve regurgitation.  Tricuspid Valve: The tricuspid valve is not well visualized. Tricuspid valve regurgitation is mild to moderate. Aortic Valve: The aortic valve is grossly normal. Aortic valve regurgitation is trivial. Aortic valve mean gradient measures 2.0 mmHg. Aortic valve peak gradient measures 3.1 mmHg. Aortic valve area, by VTI measures 1.94 cm. Pulmonic Valve: The pulmonic valve was not well visualized. Pulmonic valve regurgitation is trivial. Aorta: The aortic root is normal in size and structure. IAS/Shunts: The interatrial septum was not assessed.  LEFT VENTRICLE PLAX 2D LVIDd:         5.49 cm  Diastology LVIDs:         4.80 cm  LV e' lateral:   6.20 cm/s LV PW:         1.27 cm  LV E/e' lateral: 23.5 LV IVS:        1.20 cm  LV e' medial:    5.11 cm/s LVOT diam:     2.00 cm  LV E/e' medial:  28.6 LV SV:         28 LV SV Index:   14 LVOT Area:  3.14 cm  RIGHT VENTRICLE RV S prime:     5.87 cm/s LEFT ATRIUM             Index       RIGHT ATRIUM           Index LA diam:        4.70 cm 2.33 cm/m  RA Area:     25.90 cm LA Vol (A2C):   80.3 ml 39.89 ml/m RA Volume:   90.50 ml  44.95 ml/m LA Vol (A4C):   84.6 ml 42.02 ml/m LA Biplane Vol: 85.4 ml 42.42 ml/m  AORTIC VALVE                   PULMONIC VALVE AV Area (Vmax):    2.01 cm    PV Vmax:        0.65 m/s AV Area (Vmean):   1.67 cm    PV Peak grad:   1.7 mmHg AV Area (VTI):     1.94 cm    RVOT Peak grad: 1 mmHg AV Vmax:           87.50 cm/s AV Vmean:          65.600 cm/s AV VTI:            0.142 m AV Peak Grad:      3.1 mmHg AV Mean Grad:      2.0 mmHg LVOT Vmax:         55.90 cm/s LVOT Vmean:        34.900 cm/s LVOT VTI:          0.088 m LVOT/AV VTI ratio: 0.62  AORTA Ao Root diam: 3.00 cm MITRAL VALVE                TRICUSPID VALVE MV Area (PHT): 5.31 cm     TR Peak grad:   37.9 mmHg MV Decel Time: 143 msec     TR Vmax:        308.00 cm/s MV E velocity: 146.00 cm/s                             SHUNTS                             Systemic VTI:  0.09 m                              Systemic Diam: 2.00 cm Bartholome Bill MD Electronically signed by Bartholome Bill MD Signature Date/Time: 01/11/2020/8:57:24 AM    Final     Microbiology Recent Results (from the past 240 hour(s))  Urine Culture     Status: Abnormal   Collection Time: 01/08/20 10:45 AM   Specimen: Urine, Random  Result Value Ref Range Status   Specimen Description   Final    URINE, RANDOM Performed at [facility], [address], [place], [place] [address]    Special Requests   Final    NONE Performed at [facility], [address] [place], [place] [address]    Culture >=100,000 COLONIES/mL YEAST (A)  Final   Report Status 01/09/2020 FINAL  Final    Lab Basic Metabolic Panel: Recent Labs  Lab 01/13/20 0723 01/13/20 0723 01/14/20 0509 01/14/20 1611 01/15/20 0409 01/15/20 1550 01/16/20 0358 01/16/20 1546 02-08-20 0457  NA  126*   < > 133*   < > 135 133* 137 137 138  K 5.2*   < > 4.5   < > 4.2 4.1 4.0 4.1 5.3*  CL 93*   < > 100   < > 101 102 101 101 98  CO2 18*   < > 26   < > 23 25 24 24  17*  GLUCOSE 150*   < > 129*   < > 112* 96 102* 110* 234*  BUN 112*   < > 73*   < > 32* 22 14 10 16   CREATININE 4.19*   < > 2.87*   < > 1.65* 1.31* 1.10 1.15 1.86*  CALCIUM 8.1*   < > 7.3*   < > 7.9* 8.1* 8.2* 8.3* 8.4*  MG 2.4  --  2.3  --  2.6*  --  2.6*  --  2.7*  PHOS 6.8*   < > 4.9*   < > 2.9 2.3* 1.9* 2.4* 7.2*   < > = values in this interval not displayed.   Liver Function Tests: Recent Labs  Lab 01/11/20 0443 01/11/20 0443 01/13/20 0723 01/14/20 0509 01/15/20 0409 01/15/20 1550 01/16/20 0358 01/16/20 1546 02-12-2020 0457  AST 158*  --  50*  --   --   --   --   --   --   ALT 99*  --  60*  --   --   --   --   --   --   ALKPHOS 239*  --  162*  --   --   --   --   --   --   BILITOT 1.3*  --  1.5*  --   --   --   --   --   --   PROT 6.2*  --  5.9*  --   --   --   --   --   --   ALBUMIN 2.5*   < > 3.3*   < > 3.8 3.9 4.3 4.7 5.0   < > = values in this interval  not displayed.   No results for input(s): LIPASE, AMYLASE in the last 168 hours. No results for input(s): AMMONIA in the last 168 hours. CBC: Recent Labs  Lab 01/13/20 0723 01/13/20 0723 01/14/20 0526 01/14/20 0526 01/14/20 1614 01/14/20 2254 01/15/20 0409 01/16/20 0358 2020/02/12 0457  WBC 10.6*   < > 7.2  --   --  11.2* 11.2* 13.9* 20.3*  NEUTROABS 9.2*  --   --   --   --   --   --   --   --   HGB 8.3*   < > 7.6*   < > 7.6* 7.4* 7.4* 7.4* 7.7*  HCT 24.9*   < > 24.4*   < > 23.2* 24.0* 24.1* 23.1* 27.0*  MCV 83.0   < > 86.2  --   --  87.9 88.0 86.5 94.1  PLT 413*   < > 310  --   --  225 194 171 216   < > = values in this interval not displayed.   Cardiac Enzymes: No results for input(s): CKTOTAL, CKMB, CKMBINDEX, TROPONINI in the last 168 hours. Sepsis Labs: Recent Labs  Lab 01/14/20 2254 01/15/20 0409 01/16/20 0358 01/16/20 1045 Feb 12, 2020 0457  WBC 11.2* 11.2* 13.9*  --  20.3*  LATICACIDVEN  --   --   --  1.4  --      Jene Oravec 02/12/2020, 2:08 PM

## 2020-01-20 NOTE — Progress Notes (Signed)
Per Ginger daughter, family will be ready for comfort care transition once one other family member arrives. Spoke with Quinn Axe NP and she is aware.

## 2020-01-20 NOTE — Progress Notes (Signed)
Pt extubated per MD order

## 2020-01-20 NOTE — Progress Notes (Addendum)
   01-22-20 1400  Clinical Encounter Type  Visited With Family  Visit Type Death  Referral From Nurse  Consult/Referral To Chaplain  Chaplain notified of patient's passing. Chaplain spent time talking to patient's wife and brother. Wife said that "she is fine because she knows where he is and he is doing better then we are." Wife told chaplain about some of patient's hobbies and things he enjoyed doing.After talking to family Chaplain notified nurse that family is ready to leave, asking for any paperwork that needs to be signed. Nurse went in room to get the name of undertaker and family left.

## 2020-01-20 NOTE — Progress Notes (Signed)
[facility]  ROUNDING NOTE   Subjective:   Patient remains critically ill, vent dependent, pressors and sedated Sedation with propofol Vent: fio2 21 -> 30 % requiring pressors: levophed '@35'$  Milrinone infusion CRRT started 7/25;-> d/c on 7/28 evening UF 3900 cc, 4370 cc, 4120 cc   Overall clinical condition has worsened as patient is requiring higher dose of pressors FiO2 requirement has also increased.   Objective:  Vital signs in last 24 hours:  Temp:  [97.5 F (36.4 C)-98.3 F (36.8 C)] 98.3 F (36.8 C) (07/29 0700) Pulse Rate:  [87-117] 94 (07/29 0815) Resp:  [15-28] 16 (07/29 0815) BP: (76-125)/(43-102) 111/49 (07/29 0815) SpO2:  [89 %-100 %] 96 % (07/29 0815) FiO2 (%):  [21 %-40 %] 30 % (07/29 0817) Weight:  [88.3 kg] 88.3 kg (07/29 0500)  Weight change: -2.7 kg Filed Weights   01/15/20 0459 01/16/20 0233 02/10/2020 0500  Weight: 91.6 kg 91 kg 88.3 kg    Intake/Output: I/O last 3 completed shifts: In: 4206.1 [I.V.:2860.4; Other:70; NG/GT:619.1; IV Piggyback:656.6] Out: 6430.2 [Urine:65; Other:6365.2]   Intake/Output this shift:  No intake/output data recorded.   Physical Exam: General:  Critically ill, laying in the bed,   HEENT  ETT, OGT in place  Pulm/lungs  Vent assisted  CVS/Heart  irregular rhythm, no rub or gallop, paced  Abdomen:   Soft,   Extremities:  ++ dependent peripheral edema, left BKA  Neurologic:  sedated  Foley in place Rt IJ remp cath    Basic Metabolic Panel: Recent Labs  Lab 01/13/20 0723 01/13/20 0723 01/14/20 0509 01/14/20 1611 01/15/20 0409 01/15/20 0409 01/15/20 1550 01/15/20 1550 01/16/20 0358 01/16/20 1546 2020/02/10 0457  NA 126*   < > 133*   < > 135  --  133*  --  137 137 138  K 5.2*   < > 4.5   < > 4.2  --  4.1  --  4.0 4.1 5.3*  CL 93*   < > 100   < > 101  --  102  --  101 101 98  CO2 18*   < > 26   < > 23  --  25  --  24 24 17*  GLUCOSE 150*   < > 129*   < > 112*  --  96  --  102* 110* 234*  BUN  112*   < > 73*   < > 32*  --  22  --  '14 10 16  '$ CREATININE 4.19*   < > 2.87*   < > 1.65*  --  1.31*  --  1.10 1.15 1.86*  CALCIUM 8.1*   < > 7.3*   < > 7.9*   < > 8.1*   < > 8.2* 8.3* 8.4*  MG 2.4  --  2.3  --  2.6*  --   --   --  2.6*  --  2.7*  PHOS 6.8*   < > 4.9*   < > 2.9  --  2.3*  --  1.9* 2.4* 7.2*   < > = values in this interval not displayed.    Liver Function Tests: Recent Labs  Lab 01/11/20 0443 01/11/20 0443 01/13/20 0723 01/14/20 0509 01/15/20 0409 01/15/20 1550 01/16/20 0358 01/16/20 1546 February 10, 2020 0457  AST 158*  --  50*  --   --   --   --   --   --   ALT 99*  --  60*  --   --   --   --   --   --  ALKPHOS 239*  --  162*  --   --   --   --   --   --   BILITOT 1.3*  --  1.5*  --   --   --   --   --   --   PROT 6.2*  --  5.9*  --   --   --   --   --   --   ALBUMIN 2.5*   < > 3.3*   < > 3.8 3.9 4.3 4.7 5.0   < > = values in this interval not displayed.   No results for input(s): LIPASE, AMYLASE in the last 168 hours. No results for input(s): AMMONIA in the last 168 hours.  CBC: Recent Labs  Lab 01/13/20 0723 01/13/20 0723 01/14/20 0526 01/14/20 0526 01/14/20 1614 01/14/20 2254 01/15/20 0409 01/16/20 0358 2020-02-12 0457  WBC 10.6*   < > 7.2  --   --  11.2* 11.2* 13.9* 20.3*  NEUTROABS 9.2*  --   --   --   --   --   --   --   --   HGB 8.3*   < > 7.6*   < > 7.6* 7.4* 7.4* 7.4* 7.7*  HCT 24.9*   < > 24.4*   < > 23.2* 24.0* 24.1* 23.1* 27.0*  MCV 83.0   < > 86.2  --   --  87.9 88.0 86.5 94.1  PLT 413*   < > 310  --   --  225 194 171 216   < > = values in this interval not displayed.    Cardiac Enzymes: No results for input(s): CKTOTAL, CKMB, CKMBINDEX, TROPONINI in the last 168 hours.  BNP: Invalid input(s): POCBNP  CBG: Recent Labs  Lab 01/16/20 0756 01/16/20 1140 01/16/20 1617 01/16/20 2244 Feb 12, 2020 0756  GLUCAP 104* 122* 95 159* 257*    Microbiology: Results for orders placed or performed during the hospital encounter of 12/22/2019  Blood  Cultures x 2 sites     Status: None   Collection Time: 01/15/2020  8:48 PM   Specimen: BLOOD  Result Value Ref Range Status   Specimen Description BLOOD BLOOD LEFT FOREARM  Final   Special Requests   Final    BOTTLES DRAWN AEROBIC AND ANAEROBIC Blood Culture adequate volume   Culture   Final    NO GROWTH 5 DAYS Performed at [facility], [address] [place], [place] [address]    Report Status 01/04/2020 FINAL  Final  Culture, blood (Routine X 2) w Reflex to ID Panel     Status: None   Collection Time: 12/27/19  7:01 PM   Specimen: BLOOD  Result Value Ref Range Status   Specimen Description BLOOD BLOOD RIGHT HAND  Final   Special Requests   Final    BOTTLES DRAWN AEROBIC AND ANAEROBIC Blood Culture adequate volume   Culture   Final    NO GROWTH 5 DAYS Performed at [facility], [address], [place], [place] [address]    Report Status 01/09/2020 FINAL  Final  SARS Coronavirus 2 by RT PCR (hospital order, performed in [facility] hospital lab) Nasopharyngeal Nasopharyngeal Swab     Status: None   Collection Time: 12/27/19  8:45 PM   Specimen: Nasopharyngeal Swab  Result Value Ref Range Status   SARS Coronavirus 2 NEGATIVE NEGATIVE Final    Comment: (NOTE) SARS-CoV-2 target nucleic acids are NOT DETECTED.  The SARS-CoV-2 RNA is generally detectable in  upper and lower respiratory specimens during the acute phase of infection. The lowest concentration of SARS-CoV-2 viral copies this assay can detect is 250 copies / mL. A negative result does not preclude SARS-CoV-2 infection and should not be used as the sole basis for treatment or other patient management decisions.  A negative result may occur with improper specimen collection / handling, submission of specimen other than nasopharyngeal swab, presence of viral mutation(s) within the areas targeted by this assay, and inadequate number of viral copies (<250 copies / mL). A negative result must be combined  with clinical observations, patient history, and epidemiological information.  Fact Sheet for Patients:   StrictlyIdeas.no  Fact Sheet for Healthcare Providers: BankingDealers.co.za  This test is not yet approved or  cleared by the [place] FDA and has been authorized for detection and/or diagnosis of SARS-CoV-2 by FDA under an Emergency Use Authorization (EUA).  This EUA will remain in effect (meaning this test can be used) for the duration of the COVID-19 declaration under Section 564(b)(1) of the Act, 21 U.S.C. section 360bbb-3(b)(1), unless the authorization is terminated or revoked sooner.  Performed at [facility], [address] [place], [place] [address]   MRSA PCR Screening     Status: None   Collection Time: 12/27/19  8:45 PM   Specimen: Nasopharyngeal  Result Value Ref Range Status   MRSA by PCR NEGATIVE NEGATIVE Final    Comment:        The GeneXpert MRSA Assay (FDA approved for NASAL specimens only), is one component of a comprehensive MRSA colonization surveillance program. It is not intended to diagnose MRSA infection nor to guide or monitor treatment for MRSA infections. Performed at [facility], [address], [place], [place] [address]   Urine Culture     Status: Abnormal   Collection Time: 01/08/20 10:45 AM   Specimen: Urine, Random  Result Value Ref Range Status   Specimen Description   Final    URINE, RANDOM Performed at [facility], [address], [place], [place] [address]    Special Requests   Final    NONE Performed at [facility], [address] [place], [place] [address]    Culture >=100,000 COLONIES/mL YEAST (A)  Final   Report Status 01/09/2020 FINAL  Final    Coagulation Studies: No results for input(s): LABPROT, INR in the last 72 hours.  Urinalysis: No results for input(s): COLORURINE, LABSPEC, PHURINE, GLUCOSEU, HGBUR,  BILIRUBINUR, KETONESUR, PROTEINUR, UROBILINOGEN, NITRITE, LEUKOCYTESUR in the last 72 hours.  Invalid input(s): APPERANCEUR    Imaging: DG Chest Port 1 View  Result Date: 02/12/20 CLINICAL DATA:  Acute respiratory failure. EXAM: PORTABLE CHEST 1 VIEW COMPARISON:  01/16/2020 FINDINGS: Endotracheal tube, NG tube, right IJ line in stable position. AICD in stable position. Prior CABG. Cardiomegaly with pulmonary venous congestion again noted. Bilateral pulmonary infiltrates/edema noted. Small bilateral pleural effusions. Findings suggest CHF. Bilateral pneumonia cannot be excluded. Degenerative change thoracic spine. IMPRESSION: 1.  Lines and tubes in stable position. 2. AICD in stable position. Prior CABG. Cardiomegaly with pulmonary venous congestion, bilateral pulmonary infiltrates/edema, and small bilateral pleural effusions suggesting CHF. Electronically Signed   By: Marcello Moores  Register   On: 02/12/2020 05:52   DG Chest Port 1 View  Result Date: 01/16/2020 CLINICAL DATA:  Hypoxia EXAM: PORTABLE CHEST 1 VIEW COMPARISON:  January 14, 2020 FINDINGS: Endotracheal tube tip is 3.4 cm above the carina. Nasogastric tube tip and side port are below the diaphragm. Central  catheter tip is at the cavoatrial junction. Pacemaker present with lead tips attached to the right atrium, right ventricle, and coronary sinus. No pneumothorax. There is a small left pleural effusion. No edema or airspace opacity. Heart is mildly enlarged with slight pulmonary venous hypertension. No evident adenopathy. There is aortic atherosclerosis. IMPRESSION: Tube and catheter positions as described without pneumothorax. Cardiomegaly with pulmonary vascular congestion remains. Small left pleural effusion present. There may be a degree of underlying congestive heart failure. There is no frank edema or consolidation. Aortic Atherosclerosis (ICD10-I70.0). Electronically Signed   By: Lowella Grip III M.D.   On: 01/16/2020 07:30      Medications:   .  prismasol BGK 4/2.5 500 mL/hr at 01/16/20 1518  .  prismasol BGK 4/2.5 500 mL/hr at 01/16/20 1537  . sodium chloride 250 mL (01/16/20 2140)  . dexmedetomidine (PRECEDEX) IV infusion Stopped (01/16/20 0000)  . feeding supplement (VITAL HIGH PROTEIN) 35 mL/hr at 01-25-2020 0535  . fentaNYL infusion INTRAVENOUS 200 mcg/hr (2020/01/25 0600)  . milrinone 0.3 mcg/kg/min (2020/01/25 0600)  . norepinephrine (LEVOPHED) Adult infusion 35 mcg/min (01-25-2020 0856)  . prismasol BGK 4/2.5 2,000 mL/hr at 01/16/20 1742  . propofol (DIPRIVAN) infusion 10 mcg/kg/min (01-25-2020 0600)  . vasopressin 0.03 Units/min (January 25, 2020 0600)   . artificial tears   Both Eyes BID  . aspirin  81 mg Per Tube Daily  . B-complex with vitamin C  1 tablet Per Tube QHS  . chlorhexidine gluconate (MEDLINE KIT)  15 mL Mouth Rinse BID  . Chlorhexidine Gluconate Cloth  6 each Topical Daily  . docusate  100 mg Per Tube BID  . feeding supplement (PRO-STAT SUGAR FREE 64)  30 mL Per Tube TID  . fluticasone  2 spray Each Nare Daily  . gabapentin  100 mg Oral QHS  . heparin  5,000 Units Subcutaneous Q8H  . hydrocortisone sod succinate (SOLU-CORTEF) inj  50 mg Intravenous Q6H  . insulin aspart  0-15 Units Subcutaneous TID WC  . insulin glargine  10 Units Subcutaneous QHS  . lidocaine  1 patch Transdermal Q24H  . loratadine  10 mg Per Tube Daily  . mouth rinse  15 mL Mouth Rinse 10 times per day  . metoCLOPramide (REGLAN) injection  10 mg Intravenous QID  . multivitamin with minerals  1 tablet Per Tube Daily  . pantoprazole (PROTONIX) IV  40 mg Intravenous BID  . polyethylene glycol  17 g Per Tube Daily  . scopolamine  1 patch Transdermal Q72H  . simvastatin  10 mg Per Tube QHS  . sodium chloride flush  3 mL Intravenous Once  . vitamin B-12  1,000 mcg Per Tube Daily   sodium chloride, acetaminophen **OR** acetaminophen, alum & mag hydroxide-simeth, calcium carbonate, guaiFENesin-dextromethorphan, heparin,  midazolam, midazolam, ondansetron **OR** ondansetron (ZOFRAN) IV, oxyCODONE, polyvinyl alcohol, promethazine, sodium phosphate  Assessment/ Plan:   [name] is a 79 y.o. 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 Lucky Cowboy  1. Acute renal failure with hyperkalemia and metabolic acidosis on chronic kidney diease stage IV with  baseline creatinine of 2.24, GFR of 26 on 02/11/2018. Chronic kidney disease secondary to diabetic nephropathy, hypertension and obstructive uropathy.  No IV contrast. Renal ultrasound with no obstruction.   Acute renal failure seems to be related to ATN from hemodynamic instability - CRRT initiated on 7/25-> d/c on 7/28 with almost 12 L fluid removal CRRT was discontinued yesterday evening Today, patient's clinical condition has worsened -Palliative care discussions are in progress to decide about aggressiveness of intervention.  If family desires aggressive care, can try intermittent hemodialysis with slow blood flow.  2. Acute resp failure - vent dependent FiO2 worsened  3. Chronic systolic CHF and Generalized edema EF 25-30% Volume status improved with CRRT but still has massive fluid overload  Overall prognosis appears poor  LOS: [place] 08/24/20219:34 AM

## 2020-01-20 NOTE — Progress Notes (Signed)
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 Patient was started on IV vancomycin, Rocephin and Flagyl, podiatry and vascular surgeon were consulted. Patient had a angiogram and revascularization on 7/12. But the foot gangrene does not seem to be improving, as a result, planning for below-knee amputation. 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 Cardiology saw patient and recommendation for milrinone drip with diuresis. PCCM consult for further evaluation and management.   01/13/20- patient has worseningabdominal distension andhe had few more episodes of bilious vomitus, he is altered with encephalopathy and is clinically declining. Goals of Care: Family brought in written advanced directive. Patient wishes to be full code and an assigned POA is named (daughter Ginger) in case he is unable to make decisions. Ginger is present during [name] discussion. We reviewed GI recommendation for NGT decompression and nephrology recommendation for dialysis and cardiology favor CVP trending while on milrinone gtt. They whish to proceed with these procedures. Additionally we discussed continued vomiting in context of lethargy with shock and they do wish for patient to be intubated for airway protection. They do understand that patient is overall with poor prognosis and wish to attept at helping him through this but will not wish for prolonged aggressive care if despite full scope of therapy patient does not improve. 7/26 remains intubated,sedated on CRRT on pressors +multiorgan failure, family updated  several times 7/27 on pressors, on vent, on CRRT, failed weaning trials 7/28 on pressors, on vent, on CRRT  7/29 end stage CHF, severe cardiogenic shock, multiorgan failure   CC  follow up respiratory failure  SUBJECTIVE Patient remains critically ill Prognosis is guarded Multiple pressors Severe cardiogenic shock Patient is dying and suffering-relayed this info to daughter Ginger   BP (!) 110/49   Pulse 99   Temp 98.3 F (36.8 C)   Resp 16   Ht 6\' 2"  (1.88 m)   Wt 88.3 kg   SpO2 97%   BMI 24.99 kg/m    I/O last 3 completed shifts: In: 4206.1 [I.V.:2860.4; Other:70; NG/GT:619.1; IV Piggyback:656.6] Out: 6430.2 [Urine:65; Other:6365.2] No intake/output data recorded.  SpO2: 97 % O2 Flow Rate (L/min): 2 L/min FiO2 (%): 30 %  Estimated body mass index is 24.99 kg/m as calculated from the following:   Height as of this encounter: 6\' 2"  (1.88 m).   Weight as of this encounter: 88.3 kg.  SIGNIFICANT EVENTS   REVIEW OF SYSTEMS  PATIENT IS UNABLE TO PROVIDE COMPLETE REVIEW OF SYSTEMS DUE TO SEVERE CRITICAL ILLNESS   Pressure Injury 12/29/19 Buttocks Medial Stage 3 -  Full thickness tissue loss. Subcutaneous fat may be visible but bone, tendon or muscle are NOT exposed. Evolved to patchy areas of partial thickness wounds related to moisture associated skin damage whe (Active)  12/29/19 0442  Location: Buttocks  Location Orientation: Medial  Staging: Stage 3 -  Full thickness tissue loss. Subcutaneous fat may be visible but bone, tendon or muscle are NOT exposed.  Wound Description (Comments): Evolved to patchy areas of partial thickness wounds related to moisture associated skin damage when assessed on 7/21  Present on Admission: Yes (Pt reported area was present before admission)     Pressure Injury 01/16/20 Coccyx Mid;Lower Unstageable - Full thickness  tissue loss in which the base of the injury is covered by slough (yellow, tan, gray, green or brown) and/or eschar  (tan, brown or black) in the wound bed. Assessed with wound care nur (Active)  01/16/20 1200  Location: Coccyx  Location Orientation: Mid;Lower  Staging: Unstageable - Full thickness tissue loss in which the base of the injury is covered by slough (yellow, tan, gray, green or brown) and/or eschar (tan, brown or black) in the wound bed.  Wound Description (Comments): Assessed with wound care nurse- previous charted stage 3 now a unstageable.  Purple/Grey in color  Present on Admission: No      PHYSICAL EXAMINATION:  GENERAL:critically ill appearing, +resp distress HEAD: Normocephalic, atraumatic.  EYES: Pupils equal, round, reactive to light.  No scleral icterus.  MOUTH: Moist mucosal membrane. NECK: Supple.  PULMONARY: +rhonchi, +wheezing CARDIOVASCULAR: S1 and S2. Regular rate and rhythm. No murmurs, rubs, or gallops.  GASTROINTESTINAL: Soft, nontender, -distended.  Positive bowel sounds.   MUSCULOSKELETAL: No swelling, clubbing, or edema.  NEUROLOGIC: obtunded, GCS<8 SKIN:intact,warm,dry  MEDICATIONS: I have reviewed all medications and confirmed regimen as documented   CULTURE RESULTS   Recent Results (from the past 240 hour(s))  Urine Culture     Status: Abnormal   Collection Time: 01/08/20 10:45 AM   Specimen: Urine, Random  Result Value Ref Range Status   Specimen Description   Final    URINE, RANDOM Performed at [facility], [address] [place], [place] [address]    Special Requests   Final    NONE Performed at [facility], [address] [place], [place] [address]    Culture >=100,000 COLONIES/mL YEAST (A)  Final   Report Status 01/09/2020 FINAL  Final          IMAGING    DG Chest Port 1 View  Result Date: 02-12-20 CLINICAL DATA:  Acute respiratory failure. EXAM: PORTABLE CHEST 1 VIEW COMPARISON:  01/16/2020 FINDINGS: Endotracheal tube, NG tube, right IJ line in stable position. AICD in stable position. Prior CABG.  Cardiomegaly with pulmonary venous congestion again noted. Bilateral pulmonary infiltrates/edema noted. Small bilateral pleural effusions. Findings suggest CHF. Bilateral pneumonia cannot be excluded. Degenerative change thoracic spine. IMPRESSION: 1.  Lines and tubes in stable position. 2. AICD in stable position. Prior CABG. Cardiomegaly with pulmonary venous congestion, bilateral pulmonary infiltrates/edema, and small bilateral pleural effusions suggesting CHF. Electronically Signed   By: [place]   On: 2020/02/12 05:52     Nutrition Status: Nutrition Problem: Moderate Malnutrition Etiology: chronic illness (CKD, hx chronic left foot ulcer now s/p left BKA) Signs/Symptoms: moderate fat depletion, moderate muscle depletion Interventions: Refer to RD note for recommendations     Indwelling Urinary Catheter continued, requirement due to   Reason to continue Indwelling Urinary Catheter strict Intake/Output monitoring for hemodynamic instability   Central Line/ continued, requirement due to  Reason to continue [place] of central venous pressure or other hemodynamic parameters and poor IV access   Ventilator continued, requirement due to severe respiratory failure   Ventilator Sedation RASS 0 to -2      ASSESSMENT AND PLAN SYNOPSIS  79 yo male with progressive hypoxic resp failure from acute and severe decompensated systolic heart failure likely from demand ischemia from extensive vasc surgery complicated by abd distention and renal failure with acidosis and signs and symptoms of ileus with severe metabolic encephalopathy, SEVERE CARDIOGENIC SHOCK  Severe ACUTE Hypoxic and Hypercapnic Respiratory Failure -continue Full MV support -continue Bronchodilator  Therapy -VAP/VENT bundle implementation Patient is in dying process  ACUTE SYSTOLIC CARDIAC FAILURE- EF  ACUTE KIDNEY INJURY/Renal Failure -continue Foley Catheter-assess need -Avoid nephrotoxic  agents -Follow urine output, BMP -Ensure adequate renal perfusion, optimize oxygenation -Renal dose medications Will NOT tolerate HD     NEUROLOGY Acute toxic metabolic encephalopathy, need for sedation Goal RASS -2 to -3  SHOCK-SEPSIS/HYPOVOLUMIC/CARDIOGENIC -use vasopressors to keep MAP>65  CARDIAC ICU monitoring  ID -continue IV abx as prescibed -follow up cultures  GI GI PROPHYLAXIS as indicated  NUTRITIONAL STATUS Nutrition Status: Nutrition Problem: Moderate Malnutrition Etiology: chronic illness (CKD, hx chronic left foot ulcer now s/p left BKA) Signs/Symptoms: moderate fat depletion, moderate muscle depletion Interventions: Refer to RD note for recommendations   DIET-->TF's as tolerated Constipation protocol as indicated  ENDO - will use ICU hypoglycemic\Hyperglycemia protocol if indicated     ELECTROLYTES -follow labs as needed -replace as needed -pharmacy consultation and following   DVT/GI PRX ordered and assessed TRANSFUSIONS AS NEEDED MONITOR FSBS I Assessed the need for Labs I Assessed the need for Foley I Assessed the need for Central Venous Line Family Discussion when available I Assessed the need for Mobilization I made an Assessment of medications to be adjusted accordingly Safety Risk assessment completed   CASE DISCUSSED IN MULTIDISCIPLINARY ROUNDS WITH ICU TEAM  Critical Care Time devoted to patient care services described in this note is 32 minutes.   Overall, patient is critically ill, prognosis is guarded.  Patient with Multiorgan failure and at high risk for cardiac arrest and death.    PROGNOSIS IS GRAVE, PATIENT IS DYING AND SUFFERING RECOMMEND COMFORT CARE MEASURES  Felishia Wartman Patricia Pesa, M.D.  [facility]  Medical Director [place] Director [facility] Cardio-Pulmonary Department

## 2020-01-20 NOTE — TOC Transition Note (Signed)
Transition of Care [facility]) - CM/SW Discharge Note   Patient Details  Name: [name] MRN: [id number] Date of Birth: [birth year]  [facility] CM/SW Contact:  Adelene Amas, [place] Phone Number: 01-25-20, 2:29 PM   Clinical Narrative:     Comfort care measures. Patient passed Jan 25, 2020     Barriers to Discharge: Continued Medical Work up   Patient Goals and CMS Choice Patient states their goals for this hospitalization and ongoing recovery are:: go to a rehab after amputation      Discharge Placement                       Discharge Plan and Services   Discharge Planning Services: CM Consult                                 Social Determinants of Health (SDOH) Interventions     Readmission Risk Interventions No flowsheet data found.

## 2020-01-20 NOTE — Progress Notes (Signed)
Progress Note  Patient Name: [name] Date of Encounter: Feb 10, 2020  Primary Cardiologist: Sumner  Subjective   Intubated, sedated.  Remains on milrinone, vasopression, and levo.  Inpatient Medications    Scheduled Meds: . artificial tears   Both Eyes BID  . aspirin  81 mg Per Tube Daily  . B-complex with vitamin C  1 tablet Per Tube QHS  . chlorhexidine gluconate (MEDLINE KIT)  15 mL Mouth Rinse BID  . Chlorhexidine Gluconate Cloth  6 each Topical Daily  . docusate  100 mg Per Tube BID  . feeding supplement (PRO-STAT SUGAR FREE 64)  30 mL Per Tube TID  . fluticasone  2 spray Each Nare Daily  . gabapentin  100 mg Oral QHS  . heparin  5,000 Units Subcutaneous Q8H  . hydrocortisone sod succinate (SOLU-CORTEF) inj  50 mg Intravenous Q6H  . insulin aspart  0-15 Units Subcutaneous TID WC  . insulin glargine  10 Units Subcutaneous QHS  . lidocaine  1 patch Transdermal Q24H  . loratadine  10 mg Per Tube Daily  . mouth rinse  15 mL Mouth Rinse 10 times per day  . metoCLOPramide (REGLAN) injection  10 mg Intravenous QID  . multivitamin with minerals  1 tablet Per Tube Daily  . pantoprazole (PROTONIX) IV  40 mg Intravenous BID  . polyethylene glycol  17 g Per Tube Daily  . scopolamine  1 patch Transdermal Q72H  . simvastatin  10 mg Per Tube QHS  . sodium chloride flush  3 mL Intravenous Once  . vitamin B-12  1,000 mcg Per Tube Daily   Continuous Infusions: .  prismasol BGK 4/2.5 500 mL/hr at 01/16/20 1518  .  prismasol BGK 4/2.5 500 mL/hr at 01/16/20 1537  . sodium chloride 250 mL (01/16/20 2140)  . dexmedetomidine (PRECEDEX) IV infusion Stopped (01/16/20 0000)  . feeding supplement (VITAL HIGH PROTEIN) 35 mL/hr at Feb 10, 2020 0535  . fentaNYL infusion INTRAVENOUS 200 mcg/hr (02/10/20 1001)  . milrinone 0.3 mcg/kg/min (Feb 10, 2020 0600)  . norepinephrine (LEVOPHED) Adult infusion 35 mcg/min (02-10-2020 0856)  . prismasol BGK 4/2.5 2,000 mL/hr at 01/16/20 1742  .  propofol (DIPRIVAN) infusion 10 mcg/kg/min (2020/02/10 0600)  . vasopressin 0.03 Units/min (02-10-2020 0600)   PRN Meds: sodium chloride, acetaminophen **OR** acetaminophen, alum & mag hydroxide-simeth, calcium carbonate, guaiFENesin-dextromethorphan, heparin, midazolam, midazolam, ondansetron **OR** ondansetron (ZOFRAN) IV, oxyCODONE, polyvinyl alcohol, promethazine, sodium phosphate   Vital Signs    Vitals:   February 10, 2020 0945 02/10/2020 1000 02-10-20 1015 2020/02/10 1030  BP: (!) 97/51 (!) 96/51 (!) 91/50 (!) 90/50  Pulse: (!) 119 (!) 117 (!) 117 (!) 117  Resp: '17 16 16 16  '$ Temp:      TempSrc:      SpO2: 97% 98% 100% 100%  Weight:      Height:        Intake/Output Summary (Last 24 hours) at 02/10/20 1135 Last data filed at 02-10-20 1045 Gross per 24 hour  Intake 2794.14 ml  Output 3396 ml  Net -601.86 ml   Filed Weights   01/15/20 0459 01/16/20 0233 2020-02-10 0500  Weight: 91.6 kg 91 kg 88.3 kg    Physical Exam   GEN: Intubated, sedated, unresponsive. HEENT: Grossly normal.  Neck: Supple, no carotid bruits, or masses. Dialysis line makes judgement of JVP difficult. Cardiac: RRR, 3/6 syst murmur, no rubs, or gallops. No clubbing, cyanosis.  1+ RLE edema.  L BKA. Respiratory:  Respirations regular and unlabored, coarse breath sounds bilat.  GI: Soft, nontender, nondistended, BS + x 4. MS: no deformity or atrophy. Skin: warm and dry, no rash. Neuro:  Sedated. Psych: sedated.   Labs    Chemistry Recent Labs  Lab 01/11/20 [phone] 01/12/20 0529 01/13/20 0723 01/14/20 0509 01/16/20 0358 01/16/20 1546 2020-02-14 0457  NA 127*   < > 126*   < > 137 137 138  K 5.9*   < > 5.2*   < > 4.0 4.1 5.3*  CL 92*   < > 93*   < > 101 101 98  CO2 20*   < > 18*   < > 24 24 17*  GLUCOSE 296*   < > 150*   < > 102* 110* 234*  BUN 98*   < > 112*   < > '14 10 16  '$ CREATININE 3.55*   < > 4.19*   < > 1.10 1.15 1.86*  CALCIUM 8.0*   < > 8.1*   < > 8.2* 8.3* 8.4*  PROT 6.2*  --  5.9*  --   --   --    --   ALBUMIN 2.5*  --  3.3*   < > 4.3 4.7 5.0  AST 158*  --  50*  --   --   --   --   ALT 99*  --  60*  --   --   --   --   ALKPHOS 239*  --  162*  --   --   --   --   BILITOT 1.3*  --  1.5*  --   --   --   --   GFRNONAA 15*   < > 12*   < > >60 59* 33*  GFRAA 18*   < > 14*   < > >60 >60 38*  ANIONGAP 15   < > 15   < > 12 12 23*   < > = values in this interval not displayed.     Hematology Recent Labs  Lab 01/15/20 0409 01/16/20 0358 02/14/2020 0457  WBC 11.2* 13.9* 20.3*  RBC 2.74* 2.67* 2.87*  HGB 7.4* 7.4* 7.7*  HCT 24.1* 23.1* 27.0*  MCV 88.0 86.5 94.1  MCH 27.0 27.7 26.8  MCHC 30.7 32.0 28.5*  RDW 22.0* 22.0* 21.2*  PLT 194 171 216     Lipids  Lab Results  Component Value Date   TRIG 84 01/16/2020    HbA1c  Lab Results  Component Value Date   HGBA1C 12.0 (H) 01/04/2020    Radiology    DG Abd 1 View  Result Date: 01/14/2020 CLINICAL DATA:  Orogastric tube placement. EXAM: ABDOMEN - 1 VIEW COMPARISON:  January 13, 2020. FINDINGS: The bowel gas pattern is normal. Distal tip of enteric tube is seen in the proximal stomach. No radio-opaque calculi or other significant radiographic abnormality are seen. IMPRESSION: Distal tip of enteric tube seen in proximal stomach. Electronically Signed   By: Marijo Conception M.D.   On: 01/14/2020 12:51   DG Chest Port 1 View  Result Date: 02/14/20 CLINICAL DATA:  Acute respiratory failure. EXAM: PORTABLE CHEST 1 VIEW COMPARISON:  01/16/2020 FINDINGS: Endotracheal tube, NG tube, right IJ line in stable position. AICD in stable position. Prior CABG. Cardiomegaly with pulmonary venous congestion again noted. Bilateral pulmonary infiltrates/edema noted. Small bilateral pleural effusions. Findings suggest CHF. Bilateral pneumonia cannot be excluded. Degenerative change thoracic spine. IMPRESSION: 1.  Lines and tubes in stable position. 2. AICD in stable position. Prior CABG. Cardiomegaly with  pulmonary venous congestion, bilateral pulmonary  infiltrates/edema, and small bilateral pleural effusions suggesting CHF. Electronically Signed   By: Marcello Moores  Register   On: February 15, 2020 05:52   DG Chest Port 1 View  Result Date: 01/16/2020 CLINICAL DATA:  Hypoxia EXAM: PORTABLE CHEST 1 VIEW COMPARISON:  January 14, 2020 FINDINGS: Endotracheal tube tip is 3.4 cm above the carina. Nasogastric tube tip and side port are below the diaphragm. Central catheter tip is at the cavoatrial junction. Pacemaker present with lead tips attached to the right atrium, right ventricle, and coronary sinus. No pneumothorax. There is a small left pleural effusion. No edema or airspace opacity. Heart is mildly enlarged with slight pulmonary venous hypertension. No evident adenopathy. There is aortic atherosclerosis. IMPRESSION: Tube and catheter positions as described without pneumothorax. Cardiomegaly with pulmonary vascular congestion remains. Small left pleural effusion present. There may be a degree of underlying congestive heart failure. There is no frank edema or consolidation. Aortic Atherosclerosis (ICD10-I70.0). Electronically Signed   By: Lowella Grip III M.D.   On: 01/16/2020 07:30   DG Chest Port 1 View  Result Date: 01/14/2020 CLINICAL DATA:  Intubation and OG tube placement. EXAM: PORTABLE CHEST 1 VIEW COMPARISON:  None. FINDINGS: The heart is enlarged, exaggerated by low lung volumes. Mild pulmonary vascular congestion is present. Endotracheal tube terminates 4.5 cm above the carina. Side port of the OG tube is in the stomach. IMPRESSION: 1. Cardiomegaly and mild pulmonary vascular congestion. 2. Endotracheal tube terminates 4.5 cm above the carina. 3. Side port of the OG tube is in the stomach. Electronically Signed   By: San Morelle M.D.   On: 01/14/2020 14:11   DG Chest Port 1 View  Result Date: 01/13/2020 CLINICAL DATA:  Intubation, orogastric tube placement EXAM: PORTABLE CHEST 1 VIEW COMPARISON:  Portable exam 1600 hours compared to 08/11/2015  FINDINGS: Rotated to the RIGHT. Tip of endotracheal tube projects 4.6 cm above carina. Nasogastric tube extends into stomach. RIGHT jugular central venous catheter with tip projecting over cavoatrial junction. LEFT subclavian transvenous pacemaker with leads projecting over RIGHT atrium, RIGHT ventricle, and coronary sinus. Upper normal size of cardiac silhouette. Mediastinal contours and pulmonary vascularity normal for degree of rotation. Bibasilar atelectasis and small RIGHT pleural effusion. Mild perihilar infiltrates which could represent edema or infection. No pneumothorax. Gaseous distention of stomach. IMPRESSION: Line and tube positions as above. Small RIGHT pleural effusion, bibasilar atelectasis and perihilar infiltrates question edema versus infection. Electronically Signed   By: Lavonia Dana M.D.   On: 01/13/2020 18:10   DG Abd Portable 1V  Result Date: 01/13/2020 CLINICAL DATA:  79 year old male status post orogastric tube placement. EXAM: PORTABLE ABDOMEN - 1 VIEW COMPARISON:  01/13/2020. FINDINGS: Interval placement of enteric tube with tip projecting over the proximal body of the stomach. There is persistent gaseous distension of the stomach. Several dilated loops of gas-filled small bowel are again noted measuring up to 4 cm in the left side of the abdomen. Pacemaker leads projecting over the inferior aspect of the heart. IMPRESSION: 1. Tip of enteric tube in the proximal body of the stomach. 2. Persistent gaseous distension of the stomach and small bowel, concerning for bowel obstruction. Electronically Signed   By: Vinnie Langton M.D.   On: 01/13/2020 18:09    Telemetry    Paced - Personally Reviewed  Cardiac Studies   2D Echocardiogram 7.22.2021   1. Left ventricular ejection fraction, by estimation, is 30 to 35%. The  left ventricle has moderately decreased function. The  left ventricle  demonstrates global hypokinesis. The left ventricular internal cavity size  was mildly  dilated. Left ventricular  diastolic parameters were normal.  2. Right ventricular systolic function was not well visualized. The right  ventricular size is mildly enlarged. There is moderately elevated  pulmonary artery systolic pressure.  3. Left atrial size was mildly dilated.  4. The mitral valve is grossly normal. Mild mitral valve regurgitation.  5. Tricuspid valve regurgitation is mild to moderate.  6. The aortic valve is grossly normal. Aortic valve regurgitation is  trivial.  _____________    Patient Profile     79 y.o. male with history of CAD status post CABG x7 in 2005, bradycardia status post permanent pacemaker, and CKD stage III, being seen due to heart failure reduced ejection fraction.  Assessment & Plan    1.  Cardiogenic Shock/Acute HFrEF:  EF 30-35% by echo 7/22 - felt to potentially be lower (<20% per Dr. Garen Lah).  Pt remains critically ill and cont to require vasopressin, levophed, and milrinone.  S/p CRRT yesterday.  Remains volume overloaded.  Creat 1.86 this AM.  Cont full support.  Ideally would monitor CVPs and Co-ox.  Poor prognosis.  2.  Acute hypoxic resp failure:  Vent mgmt per CCM.  3.  Acute renal failure:  S/p CRRT.  Nephrology following.  Signed, Murray Hodgkins, NP  01/27/2020, 11:35 AM    For questions or updates, please contact   Please consult [email or web address] for contact info under Cardiology/STEMI.

## 2020-01-20 DEATH — deceased

## 2020-01-25 ENCOUNTER — Ambulatory Visit (INDEPENDENT_AMBULATORY_CARE_PROVIDER_SITE_OTHER): Payer: Self-pay | Admitting: Nurse Practitioner
